# Patient Record
Sex: Female | Born: 1950 | Race: White | Hispanic: No | Marital: Married | Smoking: Former smoker
Health system: Southern US, Community
[De-identification: ages and names within clinical notes are randomized; demographics above are authoritative.]

## PROBLEM LIST (undated history)

## (undated) DIAGNOSIS — I480 Paroxysmal atrial fibrillation: Secondary | ICD-10-CM

## (undated) DIAGNOSIS — G4733 Obstructive sleep apnea (adult) (pediatric): Secondary | ICD-10-CM

## (undated) DIAGNOSIS — I890 Lymphedema, not elsewhere classified: Secondary | ICD-10-CM

## (undated) DIAGNOSIS — R921 Mammographic calcification found on diagnostic imaging of breast: Secondary | ICD-10-CM

## (undated) DIAGNOSIS — I82401 Acute embolism and thrombosis of unspecified deep veins of right lower extremity: Secondary | ICD-10-CM

## (undated) DIAGNOSIS — J189 Pneumonia, unspecified organism: Secondary | ICD-10-CM

## (undated) DIAGNOSIS — M1990 Unspecified osteoarthritis, unspecified site: Secondary | ICD-10-CM

## (undated) DIAGNOSIS — I10 Essential (primary) hypertension: Secondary | ICD-10-CM

## (undated) DIAGNOSIS — C50919 Malignant neoplasm of unspecified site of unspecified female breast: Secondary | ICD-10-CM

## (undated) DIAGNOSIS — F329 Major depressive disorder, single episode, unspecified: Secondary | ICD-10-CM

## (undated) DIAGNOSIS — I499 Cardiac arrhythmia, unspecified: Secondary | ICD-10-CM

## (undated) DIAGNOSIS — F419 Anxiety disorder, unspecified: Secondary | ICD-10-CM

## (undated) DIAGNOSIS — I872 Venous insufficiency (chronic) (peripheral): Secondary | ICD-10-CM

## (undated) DIAGNOSIS — R0602 Shortness of breath: Secondary | ICD-10-CM

## (undated) DIAGNOSIS — F32A Depression, unspecified: Secondary | ICD-10-CM

## (undated) DIAGNOSIS — J42 Unspecified chronic bronchitis: Secondary | ICD-10-CM

## (undated) DIAGNOSIS — L0390 Cellulitis, unspecified: Secondary | ICD-10-CM

## (undated) DIAGNOSIS — Z9989 Dependence on other enabling machines and devices: Secondary | ICD-10-CM

## (undated) HISTORY — PX: CATARACT EXTRACTION W/ INTRAOCULAR LENS IMPLANT: SHX1309

---

## 1986-06-22 DIAGNOSIS — C50919 Malignant neoplasm of unspecified site of unspecified female breast: Secondary | ICD-10-CM

## 1986-06-22 HISTORY — PX: AXILLARY LYMPH NODE DISSECTION: SHX5229

## 1986-06-22 HISTORY — DX: Malignant neoplasm of unspecified site of unspecified female breast: C50.919

## 1986-06-22 HISTORY — PX: BREAST LUMPECTOMY: SHX2

## 1989-06-22 HISTORY — PX: ABDOMINAL HYSTERECTOMY: SHX81

## 1993-06-22 HISTORY — PX: CARPAL TUNNEL RELEASE: SHX101

## 1998-12-09 ENCOUNTER — Inpatient Hospital Stay (HOSPITAL_COMMUNITY): Admission: AD | Admit: 1998-12-09 | Discharge: 1998-12-15 | Payer: Self-pay | Admitting: Family Medicine

## 1999-10-22 ENCOUNTER — Encounter: Payer: Self-pay | Admitting: Family Medicine

## 1999-10-22 ENCOUNTER — Encounter: Admission: RE | Admit: 1999-10-22 | Discharge: 1999-10-22 | Payer: Self-pay | Admitting: Family Medicine

## 2000-10-29 ENCOUNTER — Encounter: Payer: Self-pay | Admitting: Family Medicine

## 2000-10-29 ENCOUNTER — Encounter: Admission: RE | Admit: 2000-10-29 | Discharge: 2000-10-29 | Payer: Self-pay | Admitting: Family Medicine

## 2003-01-10 ENCOUNTER — Encounter: Payer: Self-pay | Admitting: Family Medicine

## 2003-01-10 ENCOUNTER — Encounter: Admission: RE | Admit: 2003-01-10 | Discharge: 2003-01-10 | Payer: Self-pay | Admitting: Family Medicine

## 2003-01-31 ENCOUNTER — Encounter (INDEPENDENT_AMBULATORY_CARE_PROVIDER_SITE_OTHER): Payer: Self-pay | Admitting: Cardiology

## 2003-01-31 ENCOUNTER — Ambulatory Visit (HOSPITAL_COMMUNITY): Admission: RE | Admit: 2003-01-31 | Discharge: 2003-01-31 | Payer: Self-pay | Admitting: Cardiology

## 2005-08-11 ENCOUNTER — Encounter: Admission: RE | Admit: 2005-08-11 | Discharge: 2005-08-11 | Payer: Self-pay | Admitting: Internal Medicine

## 2005-09-07 ENCOUNTER — Ambulatory Visit (HOSPITAL_COMMUNITY): Admission: RE | Admit: 2005-09-07 | Discharge: 2005-09-07 | Payer: Self-pay | Admitting: Gastroenterology

## 2005-09-07 ENCOUNTER — Encounter (INDEPENDENT_AMBULATORY_CARE_PROVIDER_SITE_OTHER): Payer: Self-pay | Admitting: *Deleted

## 2006-06-01 ENCOUNTER — Ambulatory Visit (HOSPITAL_COMMUNITY): Admission: RE | Admit: 2006-06-01 | Discharge: 2006-06-01 | Payer: Self-pay | Admitting: Orthopedic Surgery

## 2006-08-18 ENCOUNTER — Encounter: Admission: RE | Admit: 2006-08-18 | Discharge: 2006-08-18 | Payer: Self-pay | Admitting: Internal Medicine

## 2007-03-14 ENCOUNTER — Inpatient Hospital Stay (HOSPITAL_COMMUNITY): Admission: AD | Admit: 2007-03-14 | Discharge: 2007-03-16 | Payer: Self-pay | Admitting: Internal Medicine

## 2007-03-15 ENCOUNTER — Encounter (INDEPENDENT_AMBULATORY_CARE_PROVIDER_SITE_OTHER): Payer: Self-pay | Admitting: Internal Medicine

## 2007-04-06 ENCOUNTER — Inpatient Hospital Stay (HOSPITAL_COMMUNITY): Admission: EM | Admit: 2007-04-06 | Discharge: 2007-04-12 | Payer: Self-pay | Admitting: Emergency Medicine

## 2007-04-07 ENCOUNTER — Ambulatory Visit: Payer: Self-pay | Admitting: Internal Medicine

## 2007-04-08 ENCOUNTER — Ambulatory Visit: Payer: Self-pay | Admitting: Vascular Surgery

## 2007-04-08 ENCOUNTER — Encounter (INDEPENDENT_AMBULATORY_CARE_PROVIDER_SITE_OTHER): Payer: Self-pay | Admitting: Internal Medicine

## 2008-03-07 ENCOUNTER — Encounter: Admission: RE | Admit: 2008-03-07 | Discharge: 2008-03-07 | Payer: Self-pay | Admitting: Interventional Radiology

## 2008-04-18 ENCOUNTER — Encounter: Admission: RE | Admit: 2008-04-18 | Discharge: 2008-04-18 | Payer: Self-pay | Admitting: Interventional Radiology

## 2008-04-25 ENCOUNTER — Encounter: Admission: RE | Admit: 2008-04-25 | Discharge: 2008-04-25 | Payer: Self-pay | Admitting: Interventional Radiology

## 2008-06-19 ENCOUNTER — Encounter: Admission: RE | Admit: 2008-06-19 | Discharge: 2008-06-19 | Payer: Self-pay | Admitting: Interventional Radiology

## 2008-07-31 ENCOUNTER — Encounter: Admission: RE | Admit: 2008-07-31 | Discharge: 2008-07-31 | Payer: Self-pay | Admitting: Interventional Radiology

## 2008-08-14 ENCOUNTER — Encounter (HOSPITAL_BASED_OUTPATIENT_CLINIC_OR_DEPARTMENT_OTHER): Admission: RE | Admit: 2008-08-14 | Discharge: 2008-11-12 | Payer: Self-pay | Admitting: General Surgery

## 2008-11-12 ENCOUNTER — Encounter (HOSPITAL_BASED_OUTPATIENT_CLINIC_OR_DEPARTMENT_OTHER): Admission: RE | Admit: 2008-11-12 | Discharge: 2009-02-10 | Payer: Self-pay | Admitting: Internal Medicine

## 2010-09-03 ENCOUNTER — Emergency Department (HOSPITAL_COMMUNITY): Payer: Medicare Other

## 2010-09-03 ENCOUNTER — Emergency Department (HOSPITAL_COMMUNITY)
Admission: EM | Admit: 2010-09-03 | Discharge: 2010-09-03 | Disposition: A | Discharge status: Home / Self Care | Payer: Medicare Other | Attending: Emergency Medicine | Admitting: Emergency Medicine

## 2010-09-03 DIAGNOSIS — S52539A Colles' fracture of unspecified radius, initial encounter for closed fracture: Secondary | ICD-10-CM | POA: Insufficient documentation

## 2010-09-03 DIAGNOSIS — M25539 Pain in unspecified wrist: Secondary | ICD-10-CM | POA: Insufficient documentation

## 2010-09-03 DIAGNOSIS — M25519 Pain in unspecified shoulder: Secondary | ICD-10-CM | POA: Insufficient documentation

## 2010-09-03 DIAGNOSIS — W010XXA Fall on same level from slipping, tripping and stumbling without subsequent striking against object, initial encounter: Secondary | ICD-10-CM | POA: Insufficient documentation

## 2010-09-03 DIAGNOSIS — I10 Essential (primary) hypertension: Secondary | ICD-10-CM | POA: Insufficient documentation

## 2010-09-03 DIAGNOSIS — S42293A Other displaced fracture of upper end of unspecified humerus, initial encounter for closed fracture: Secondary | ICD-10-CM | POA: Insufficient documentation

## 2010-09-03 DIAGNOSIS — Y92009 Unspecified place in unspecified non-institutional (private) residence as the place of occurrence of the external cause: Secondary | ICD-10-CM | POA: Insufficient documentation

## 2010-09-08 ENCOUNTER — Inpatient Hospital Stay (HOSPITAL_COMMUNITY)
Admission: EM | Admit: 2010-09-08 | Discharge: 2010-09-10 | DRG: 560 | Disposition: A | Discharge status: Home Health | Payer: Medicare Other | Attending: Family Medicine | Admitting: Family Medicine

## 2010-09-08 ENCOUNTER — Emergency Department (HOSPITAL_COMMUNITY): Payer: Medicare Other

## 2010-09-08 DIAGNOSIS — I872 Venous insufficiency (chronic) (peripheral): Secondary | ICD-10-CM | POA: Diagnosis present

## 2010-09-08 DIAGNOSIS — L97909 Non-pressure chronic ulcer of unspecified part of unspecified lower leg with unspecified severity: Secondary | ICD-10-CM | POA: Diagnosis present

## 2010-09-08 DIAGNOSIS — I10 Essential (primary) hypertension: Secondary | ICD-10-CM | POA: Diagnosis present

## 2010-09-08 DIAGNOSIS — IMO0001 Reserved for inherently not codable concepts without codable children: Principal | ICD-10-CM

## 2010-09-08 LAB — DIFFERENTIAL
Eosinophils Relative: 3 % (ref 0–5)
Lymphocytes Relative: 23 % (ref 12–46)
Neutro Abs: 6.8 10*3/uL (ref 1.7–7.7)
Neutrophils Relative %: 67 % (ref 43–77)

## 2010-09-08 LAB — CBC
HCT: 37.6 % (ref 36.0–46.0)
Hemoglobin: 12.4 g/dL (ref 12.0–15.0)
MCH: 30.2 pg (ref 26.0–34.0)
MCV: 91.7 fL (ref 78.0–100.0)
Platelets: 273 10*3/uL (ref 150–400)
RDW: 13.9 % (ref 11.5–15.5)

## 2010-09-08 LAB — POCT I-STAT, CHEM 8
BUN: 11 mg/dL (ref 6–23)
Calcium, Ion: 1.17 mmol/L (ref 1.12–1.32)
Chloride: 104 mEq/L (ref 96–112)
Glucose, Bld: 99 mg/dL (ref 70–99)
HCT: 38 % (ref 36.0–46.0)
Sodium: 139 mEq/L (ref 135–145)

## 2010-09-09 LAB — BASIC METABOLIC PANEL
BUN: 10 mg/dL (ref 6–23)
Calcium: 9 mg/dL (ref 8.4–10.5)
Creatinine, Ser: 0.84 mg/dL (ref 0.4–1.2)
GFR calc Af Amer: >60 mL/min (ref >60)
Glucose, Bld: 101 mg/dL — ABNORMAL HIGH (ref 70–99)

## 2010-09-09 LAB — LIPID PANEL
LDL Cholesterol: 71 mg/dL (ref 0–99)
VLDL: 16 mg/dL (ref 0–40)

## 2010-09-09 NOTE — H&P (Signed)
NAMEELMER, [name]              ACCOUNT NO.:  [id number]  MEDICAL RECORD NO.:  [id number]           PATIENT TYPE:  E  LOCATION:  [facility]                         FACILITY:  [facility]  PHYSICIAN:  Homero Fellers, MD   DATE OF BIRTH:  January 11, 1951  DATE OF ADMISSION:  09/08/2010 DATE OF DISCHARGE:                             HISTORY & PHYSICAL   PRIMARY CARE PHYSICIAN:  None.  CHIEF COMPLAINT:  Fall with fracture to the right shoulder and left wrist.  HISTORY OF PRESENT ILLNESS:  This is a 60 year old obese Caucasian woman who lives alone by herself.  She fell about 5 days ago and came to the emergency room.  Evaluation showed impacted right shoulder fracture with comminuted left radial fracture.  She had a cast placed on her left wrist, and the right shoulder was partly immobilized.  She was asked to follow up with Orthopedics as an outpatient.  The patient went home on pain medicines, but because of multiple fractures, she could not take care of herself including feeding, Bactrim use, and other simple day to day activities.  She came to the emergency room after her son who was visiting decided to bring her to the hospital, will be asked to admit her because of social issues.  She will need placement to allow the fracture to heal.  She has complained of mild pain in the right shoulder, left wrist, and the right hip area.  There is no headache, fever, cough, chest pain, shortness of breath, nausea, or vomiting.  PAST MEDICAL HISTORY: 1. High blood pressure. 2. History of chronic leg swelling with recurrent cellulitis. 3. She has healing ulcer over the posterior surface of the right leg,     which has almost healed up.  MEDICATION:  Cannot remember.  ALLERGIES:  EFFEXOR.  SOCIAL HISTORY:  No smoking, alcohol, or drugs.  FAMILY HISTORY:  Noncontributory.  A 10-point review of systems negative except as above.  PHYSICAL EXAMINATION:  VITAL SIGNS:  Blood pressure is 160/73,  pulse 85, respirations 14, temperature is 98.3. GENERAL:  The patient is obese, lying in bed comfortable, appeared to be in no distress. NECK:  Supple.  No JVD, adenopathy, or thyromegaly. LUNGS:  Clear to auscultation bilaterally. HEART:  S1 and S2.  No murmurs, rubs, or gallops. ABDOMEN:  Obese, soft, nontender.  Bowel sounds present.  No masses. EXTREMITIES:  Trace to 1+ edema bilaterally with stage I healing ulcer over the posterior aspect of the right leg. MUSCULOSKELETAL:  There is a cast over the left wrist and forearm.  LABORATORY:  White count is 10.1, hemoglobin is 12.4, platelet count is 273.  Sodium 139, potassium 3.8, BUN 11, creatinine 0.8, glucose 99.  ASSESSMENT: 1. Recent fracture involving the right shoulder, which is impacted and     comminuted left radial fracture. 2. Poor self-care. 3. Uncontrolled high blood pressure. 4. Morbid obesity.  PLAN:  Admit to medical bed.  The patient is on pain medicines.  Social worker will be consulted to assist with placement to allow fracture to heal up.  She will be on DVT prophylaxis.  Her condition is otherwise stable.  I will start her on small dose of blood pressure medicine to control her blood pressure.     Homero Fellers, MD     FA/MEDQ  D:  09/08/2010  T:  09/08/2010  Job:  [id number]  Electronically Signed by Homero Fellers  on 09/09/2010 02:16:17 AM

## 2010-09-11 NOTE — Discharge Summary (Signed)
NAMEMIRIANA, GAERTNER              ACCOUNT NO.:  [id number]  MEDICAL RECORD NO.:  [id number]           PATIENT TYPE:  I  LOCATION:  5008                         FACILITY:  [facility]  PHYSICIAN:  Brendia Sacks, MD    DATE OF BIRTH:  Feb 16, 1951  DATE OF ADMISSION:  09/08/2010 DATE OF DISCHARGE:  09/10/2010                              DISCHARGE SUMMARY   CONDITION ON DISCHARGE:  Stable.  PRIMARY CARE PHYSICIAN:  None.  PRIMARY ORTHOPEDIC SURGEON:  Lubertha Basque. Jerl Santos, MD  DISCHARGE DIAGNOSES: 1. History of recent fall. 2. Left wrist and right shoulder fractures. 3. Hypertension, stable.  HISTORY OF PRESENT ILLNESS:  This is a 60 year old woman who lives by herself.  She fell approximately 5 days prior to admission and sustained a fracture of the right shoulder and the left wrist fracture.  Cast was placed on her left wrist and her right shoulder was apparently partially immobilized.  She was then discharged home to followup with Orthopedics. She went home on pain medications, but she was having difficulty taking care of herself, so she came back to the emergency room.  She was admitted because it was thought that she may need placement.  HOSPITAL COURSE: 1. History of recent fall resulting in left wrist and right shoulder     fractures.  The patient was seen in consultation with Orthopedics     during this hospitalization.  Dr. Jerl Santos recommended conservative     management that is to say no surgery.  He recommended continuing     the right shoulder in the sling and placing a splint on the left     wrist.  The patient was seen by physical and occupational therapy     and skilled rehab was recommended.  However, because her health is     otherwise stable, she does not meet criteria for admission or     observation unfortunately.  Therefore, she does not qualify for     rehab stay.  Of note, the patient is able to ambulate without     assistance and go to the bathroom without  assistance.  She however     does have a chronic right leg wound, which because of her right     shoulder fracture and left wrist fracture, she is not going to be     able to manage herself.  Therefore, it has been recommended that     she have home health RN assist with this, and of course, it has     been recommended by physical and occupational therapy that she have     outpatient physical and occupational therapy as well the bariatric     3 in 1.  It was also recommended a social work as an outpatient     because she lives alone to facilitate her other needs. 2. Hypertension, this appeared to be stable.  She will continue on     hydrochlorothiazide and follow up in the outpatient setting.  CONSULTATIONS:  Orthopedics, their recommendations as above.  PROCEDURES:  None.  IMAGING: 1. Left wrist film on September 08, 2010, distal radius fracture.  2. Films 5 days prior to admission as follows, the right shoulder film     on September 03, 2010 showed fracture of the distal clavicle and     dedicated clavicular film on September 03, 2010 showed impacted     fracture of the humeral head involving the articular surface.  LABORATORY STUDIES: 1. TSH within normal limits. 2. Total cholesterol 132, triglycerides 29, HDL 45, LDL 71. 3. CBC unremarkable. 4. Basic metabolic panel unremarkable.  DISCHARGE INSTRUCTIONS:  The patient will be discharged home today. Increase activity slowly.  We will have staff contact Dr. Nolon Nations office for recommendations for activity.  Follow up with Dr. Jerl Santos in 1-2 weeks, and the patient should also follow up and establish with a primary care physician in the next 2-4 weeks.  DISCHARGE MEDICATIONS: 1. Acetaminophen 325 mg 2 tablets by mouth every 4 hours as needed for     pain. 2. Oxycodone 5 mg immediate release tablet 5 mg every 4 hours as     needed for pain #45, no refills. 3. Lisinopril 20 mg p.o. daily.  TIME COORDINATING DISCHARGE:  Twenty  minutes.     Brendia Sacks, MD     DG/MEDQ  D:  09/10/2010  T:  09/11/2010  Job:  [id number]  cc:   Lubertha Basque. Jerl Santos, M.D.  Electronically Signed by Brendia Sacks  on 09/11/2010 04:33:44 PM

## 2010-11-04 NOTE — Assessment & Plan Note (Signed)
[facility]   NAME:  [name], [name]              ACCOUNT NO.:  [id number]   MEDICAL RECORD NO.:  [id number]      DATE OF BIRTH:  Mar 03, 1951   PHYSICIAN:  Jonelle Sports. Sevier, M.D.  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  She had  developed posterior ulcerations on both legs in the area of the distal  muscle bundles and has been under treatment here with usual measures  plus the use of compression pumps at home, which recently we increased  from 1 hour b.i.d. to 1 hour t.i.d.  She had reopened prior to that  increase in area on the posterior aspect of her right lower extremity  and is for that she is seen here today.   PHYSICAL EXAMINATION:  VITAL SIGNS:  Blood pressure 143/78, pulse 111,  respirations 14, and temperature 98.1.  There is not any clearly open  area now on the posterior aspect of the right calf, but there is still  some weeping.   IMPRESSION:  Near resolution of wound, posterior right calf.   DISPOSITION:  The areas was treated with an application of Bactroban and  then covered with a dry dressing.  She is to change the dressing daily  and cleanse the area appropriately at the time of dressing changes.  She  is to continue pumping for 1 hour 3 times daily with her compression  pump.   FOLLOWUP VISIT:  Will be here in 3 weeks or sooner p.r.n.           ______________________________  Jonelle Sports. Cheryll Cockayne, M.D.     RES/MEDQ  D:  11/14/2008  T:  11/15/2008  Job:  [id number]

## 2010-11-04 NOTE — Assessment & Plan Note (Signed)
[facility]   NAME:  BRIAR, SWORD              ACCOUNT NO.:  [id number]   MEDICAL RECORD NO.:  [id number]      DATE OF BIRTH:  [birth year]   PHYSICIAN:  Jonelle Sports. Sevier, M.D.  VISIT DATE:  09/05/2008                                   OFFICE VISIT   HISTORY:  This 60 year old white female who is being followed for venous  stasis ulcerations of the posterior aspects of both calves, which  occurred in the face of chronic venous and lymphedema, and also chronic  morbid obesity.   Thus far, the wounds have been responding nicely to conventional therapy  and plans are underway to obtain venous pumps for the patient about  which she seems to be rather enthusiastic.   Since last seen here a week ago, she has had no particular troubles.   PHYSICAL EXAMINATION:  VITAL SIGNS:  Blood pressure 170/77, pulse 98,  respirations 18, and temperature 98.5.  EXTREMITIES:  On her right posterior lower extremity is one ulcer  measuring 2.5 x 2.0 x 0.1 cm and another measuring 1.3 x 1.3 x 0.1 cm  whereas on the posterior left lower extremity, there is a single ulcer  measuring 0.9 x 1.0 x 0.1 cm.  All of these have some minimal slough in  their bases.   IMPRESSION:  Chronic stasis ulcerations bilateral lower extremities with  acceptable rate of improvement.   DISPOSITION:  Once again today, contact was made with the [facility]  regarding the patient's venous pumps and they promise a definite answer  with respect to coverage, etc., within the next 2 days.   Meanwhile, the patient's wounds are selectively debrided of the small  amounts of slough that are existing in each, this is not without some  discomfort to the patient despite the use of 5% lidocaine ointment, but  we are able to complete the treatment.  The extremities were then placed  in Unna wraps bilaterally and just to pace component and then the 2  outer layers a pro for wrap were placed on both lower extremities.   Follow up visit will be here in 1 week for the nurse to change her  dressings and in 2 weeks to the physician.           ______________________________  Jonelle Sports. Cheryll Cockayne, M.D.     RES/MEDQ  D:  09/05/2008  T:  09/06/2008  Job:  [id number]

## 2010-11-04 NOTE — H&P (Signed)
[name], [name]              ACCOUNT NO.:  [id number]   MEDICAL RECORD NO.:  [id number]          PATIENT TYPE:  INP   LOCATION:  5028                         FACILITY:  [facility]   PHYSICIAN:  Marcellus Scott, MD     DATE OF BIRTH:  09-20-1950   DATE OF ADMISSION:  03/14/2007  DATE OF DISCHARGE:                              HISTORY & PHYSICAL   PRIMARY MEDICAL DOCTOR:  Olene Craven, M.D.   CHIEF COMPLAINT:  Recurrent pain, redness and swelling of the right  upper extremity and right breast for two weeks.   HISTORY OF PRESENTING ILLNESS:  [name] is a pleasant 60 year old  Caucasian female patient with history of hypertension, right breast  cancer status post lumpectomy in 1988, who was directly admitted to the  medical floor by the primary medical doctor.  The patient was in her  usual state of health until approximately two weeks go, when she noted  pain, redness, swelling, itching of her right upper extremity.  She had  a small cut in the web space of her 3rd and 4th finger.  That cut might  have precipitated all of this.  In any case, the patient also had high  grade fever of 102.7 degrees Fahrenheit with associated chills.  The  patient was treated with a course of Doxycycline with complete  resolution of symptoms in that extremity, only to start having the same  symptoms in the right breast when the patient was switched to oral  Levaquin.  That also resolved five days ago.  Then, again, since one day  the patient has noted subacute onset of pain, redness, swelling, burning  of the right upper extremity.  She also has some fever and chills.  The  patient says that since her surgery for the breast cancer her right  upper extremity is asymmetrically slightly larger than the left.  The  pain, she indicates, is a burning kind of pain.  The patient has also a  minimal amount of redness and pain on the right breast.  She has chronic  redness underneath both breasts for a  long time.  For this presentation,  the patient has been sent to the hospital floor for IV antibiotic  treatment.   PAST MEDICAL HISTORY:  1. Hypertension.  2. Bronchitis.  3. History of bilateral lower extremity cellulitis years ago which      required I&D and has resolved for the last five years.  4. Obesity.  5. Chronic diarrhea.   PAST SURGICAL HISTORY:  1. Right breast cancer status post lumpectomy with axillary node      sampling and status post irradiation in 1988.  2. Hysterectomy in 1992.  3. Carpal tunnel syndrome surgery on the right in 2008.   ALLERGIES:  EFFEXOR   MEDICATIONS:  1. Levaquin 500 mg p.o. daily.  2. Vicodin 5/500 one p.o. q6 p.r.n  3. Vibra tabs 100 mg p.o. b.i.d.  4. Zoloft 50 mg p.o. daily.  5. Lisinopril--Hydrochlorothiazide 10/12.5, one p.o. daily.   FAMILY HISTORY:  1. Mother has history of phlebitis.  2. Older brother with history  of hypertension.   SOCIAL HISTORY:  The patient is married.  She has two older children.  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  She quit smoking on Oct 27, 2006.  Prior to  that, she has a 40-pack-year smoking history.  She has used marijuana  decades ago.  There is no history of alcohol or drug abuse.   REVIEW OF SYSTEMS:  Fourteen systems reviewed and apart from the history  of present illness, is pertinent for:  1. Mammogram done this year, said to be normal.  2. Diarrhea of about 5-7x daily, which is a chronic symptom and has      been evaluated by GI.  3. Chronic dyspnea on exertion secondary to obesity.   PHYSICAL EXAMINATION:  [name] is a moderately built and morbidly  obese female.  The patient is in no obvious distress.  VITAL SIGNS:  Temperature is 98.3 degrees Fahrenheit, pulse 70/min,  respirations 18/min, and blood pressure 111/78, saturating at 98% on  room air.  HEENT:  Nontraumatic, normocephalic.  Pupils equally reacting to  light  and accommodation.  Oral cavity with no oropharyngeal erythema or  thrush.  NECK:  Without JVD, carotid bruit, adenopathy, or goiter. Supple.  LYMPHATICS:  No lymphadenopathy.  RESPIRATORY:  Clear to auscultation bilaterally.  CARDIOVASCULAR:  First and second heart sounds are heard.  No third or  fourth heart sounds.  No murmurs, rubs, or gallops.  ABDOMEN:  Non distended, nontender.  No organomegaly or mass.  Bowel  sounds are normal.  CNS:  The patient is awake, alert, oriented x3, with no focal  neurological deficits.  EXTREMITIES:  Bilateral lower extremities with hyperpigmented below the  midshin.  Right upper extremity swollen diffusely with diffuse erythema,  more so in the forearm and in the arm, more so medially.  There is  associated warmth and tenderness.  No obvious fluctuation suggestive of  an underlying abscess.  BREASTS:  Examination done with a chaperone nurse in the room.  Right  breast with surgical scar on the right upper quadrant.  Right breast is  asymmetrically smaller since surgery.  The skin is indurated with ?peau  d' orange with patchy redness but nontender or warm.  There is also  extensive erythema with some excoriation underneath the breast.   LAB:  Labs are yet to be drawn.   ASSESSMENT/PLAN:  1. Recurrent cellulitis, right upper extremity and breast.  Will admit      patient to the hospital.  Will obtain blood cultures.  Will elevate      the right upper extremity.  Will obtain right upper extremity      venous doppler to rule out DVT and breast ultrasound to rule out      any collections or mass.  Will place the patient on empiric IV      Vancomycin and monitor for response.  Will also provide pain      medications.  2. Fungal infection/Tinea underneath the breasts, for Nystatin powder.  3. Hypertension, which is controlled.  To continue home Lisinopril and      Hydrochlorothiazide.  4. History of obesity.  The patient has had nutrition  consult at the      PMD's office regarding diet and exercise.      Marcellus Scott, MD  Electronically Signed     AH/MEDQ  D:  03/14/2007  T:  03/14/2007  Job:  [id number]   cc:   Olene Craven, M.D.

## 2010-11-04 NOTE — Assessment & Plan Note (Signed)
[facility]   NAME:  [name], [name]              ACCOUNT NO.:  [id number]   MEDICAL RECORD NO.:  [id number]      DATE OF BIRTH:  02-Feb-1951   PHYSICIAN:  Joanne Gavel, M.D.         VISIT DATE:  11/07/2008                                   OFFICE VISIT   HISTORY OF PRESENT ILLNESS:  A 60 year old female with severe stasis  dermatitis and ulcerations previously to both lower extremities.  They  were healed.  She has been on venous lymphedema pump therapy twice a day  with marked relief of symptoms.   PHYSICAL EXAMINATION:  Today reveals the patient is afebrile and stable,  but she has developed two very superficial wounds in the midst of the  stasis dermatitis on the right lower extremity.   TREATMENT AND PLAN:  The wounds were dressed with Bactroban and dry  dressing and she will try 3 times a day pumping to see if she can  decrease the edema.  We will see her in 7 days.  We talked about  Profore, Kerlix and [employer] and she essentially refuses all of those  treatments saying that they are ineffective.      Joanne Gavel, M.D.  Electronically Signed     RA/MEDQ  D:  11/07/2008  T:  11/08/2008  Job:  [id number]

## 2010-11-04 NOTE — Assessment & Plan Note (Signed)
[facility]   NAME:  RAINN, ZUPKO              ACCOUNT NO.:  [id number]   MEDICAL RECORD NO.:  [id number]      DATE OF BIRTH:  12-16-1950   PHYSICIAN:  Joanne Gavel, M.D.              VISIT DATE:                                   OFFICE VISIT   OFFICE VISIT:  This is a 60 year old white female with stasis dermatitis  and ulcerations previously of both lower extremities, now the ulceration  only of the right lower extremity.  She has been on venous pump therapy.  There has been a great deal of relief of symptoms, pain, and the wounds  are smaller.  The wounds have been treated by application of Bactroban,  and most recently Kerlix and Coban.  These are changed by her son every  2 days.  The right posterior lower extremity has several small posterior  ulcerations with some granulation tissue.  There is no maceration and  they are not tender.  The left lower extremity is essentially healed  with epithelialization.  No debridement appears to be necessary.   DISPOSITION:  The right lower extremity will be wrapped with Kerlix,  Coban after Bactroban is applied to the ulcerations.  The patient is  reminded about popping and about elevation.  She no longer wishes to see  the home helpers and will be seen here in 2 weeks.  She has been warned  that if anything happens to the right leg which is no longer being  wrapped, she is to call us immediately.           ______________________________  Joanne Gavel, M.D.     RA/MEDQ  D:  10/03/2008  T:  10/04/2008  Job:  [id number]

## 2010-11-04 NOTE — Discharge Summary (Signed)
[name], [name]              ACCOUNT NO.:  [id number]   MEDICAL RECORD NO.:  [id number]          PATIENT TYPE:  INP   LOCATION:  5704                         FACILITY:  [facility]   PHYSICIAN:  Hind I Elsaid, MD      DATE OF BIRTH:  10-17-1950   DATE OF ADMISSION:  04/06/2007  DATE OF DISCHARGE:  04/12/2007                               DISCHARGE SUMMARY   DISCHARGE DIAGNOSES:  1. Recurrent right arm cellulitis.  2. Hypertension.  3. Right breast lumpectomy with axillary node sampling and is status      post radiation with some change on the breast.  Recommend to follow      with her primary care for mammogram and ultrasound.  4. Hysterectomy in 1992.  5. Carpal tunnel syndrome on the right in 2008.   DISCHARGE MEDICATIONS:  1. Septra-DS 2 tabs p.o. b.i.d. for one week.  2. Vicodin 5/500 mg p.o. every six hours.  3. Zoloft 50 mg p.o. daily.  4. Lisinopril/hydrochlorothiazide 10/12.5 one tab p.o. daily.   CONSULTATIONS:  1. ID consulted for recurrent cellulitis.  2. Hand surgery, Dr. Magnus Ivan consulted for worsening hand cellulitis.   PROCEDURE:  1. Chest x-ray:  Vascular congestion without frank edema.  2. Right upper extremity DVT venous Doppler which was negative for      DVT.   HISTORY OF PRESENT ILLNESS:  Please review the history done by Dr.  Eda Paschal.  In summary, a 60 year old Caucasian female with history of  morbid obesity, hypertension, being followed periodically with mammogram  without active recurrence of the tumor.  Presented with third episode of  cellulitis of her right arm status post failure for outpatient  treatment.  The patient was admitted for further evaluation.   1. Recurrent right arm cellulitis:  The patient was seen in the ED.      Very extensive swelling and erythema of the right arm.  The patient      was started on vancomycin IV.  Blood culture and urine culture were      done which were negative during hospitalization.  On day 2 of  vancomycin, right arm cellulitis and erythema started to get worse      and with bullae presence on the dorsum of her right hand.  Hand      surgery was consulted for impending compartment syndrome.  Hand      surgery recommended to keep the right arm elevated and no need for      surgical intervention at this time.  ID was consulted for      evaluation about the most appropriate antibiotic to avoid any      recurrence.  The patient continued to be on vancomycin.  During the      hospitalization, the right arm swelling and erythema gradually      resolved.  The patient will be discharged on oral Septra double-      strength 2 tabs b.i.d. for one week and recommend that the patient      follow up with Dr. Barbee Shropshire to consider fitting with compressive of  the right arm to counteract the chronic lymphedema.  During the      hospitalization, the patient did not spike a fever and there is no      evidence of white blood cells.  2. Hypertension:  Blood pressure remained controlled.   DISPOSITION:  The patient was medically stable to be discharged home and  to follow up with Dr. Barbee Shropshire within ten days to evaluate if the  patient would benefit from fitting with compressor of the right arm  sleeve  counteract the edema.      Hind Bosie Helper, MD  Electronically Signed     HIE/MEDQ  D:  04/12/2007  T:  04/13/2007  Job:  [id number]

## 2010-11-04 NOTE — Assessment & Plan Note (Signed)
[facility]   NAME:  [name], FEIN              ACCOUNT NO.:  [id number]   MEDICAL RECORD NO.:  [id number]      DATE OF BIRTH:  [birth year]   PHYSICIAN:  Jonelle Sports. Sevier, M.D.  VISIT DATE:  09/19/2008                                   OFFICE VISIT   HISTORY:  This 60 year old white female is seen just for her second  visit for severe stasis dermatitis with ulcerations in both lower  extremities with the ulcerations interestingly being on the posterior  calves.   We have started her on venous pump therapy with which she is  tremendously satisfied, and the wounds themselves have been treated with  an application of Bactroban and Unna paced layer wrap covered then by  the remainder of a Profore wrap (the patient says that she cannot  tolerate the inner ingredient of Profore wrap against her legs.)   Today, she reports considerable pain in the posterior aspect of the  right calf, but otherwise is doing well.   PHYSICAL EXAMINATION:  Blood pressure 116 /85, pulse 84, respirations  18, temperature 98.4.  The posterior left lower extremity is essentially  healed of any open lesions but persists with considerable localized  stasis dermatitis-type effect.   The right posterior lower extremity has several small ulcerations open  now and indeed there is some maceration there and things were tender  (she does admit to have not gotten her dressing saturated on repeated  occasions at the time of showers).   The measurements of these things are documented in the patient's chart  and will not be here repeated.   IMPRESSION:  Satisfactory course of left lower extremity, some  maceration and delayed healing with increased pain in the right lower  extremity.   DISPOSITION:  The area in question is debrided of some of the loose  macerated skin and this is well tolerated.  Following such debridement,  it is then cultured.   Both extremities were then treated with an  application of Bactroban to  the inflamed areas and are placed in Kerlix and Coban wraps and that it  seems the patient will not tolerate either dermatologically or  psychologically anything more.  She will continue with her venous pumps  at home on a 1-hour twice daily basis.   Followup visit will be here in 2 weeks with a Home Health nurse in the  interim to cleanse and apply Bactroban and rewrap her extremities on a  q.2-day basis.           ______________________________  Jonelle Sports Cheryll Cockayne, M.D.     RES/MEDQ  D:  09/19/2008  T:  09/20/2008  Job:  [id number]

## 2010-11-04 NOTE — H&P (Signed)
[name], [name]              ACCOUNT NO.:  [id number]   MEDICAL RECORD NO.:  [id number]          PATIENT TYPE:  INP   LOCATION:  5704                         FACILITY:  [facility]   PHYSICIAN:  Hind I Elsaid, MD      DATE OF BIRTH:  27-Jul-1950   DATE OF ADMISSION:  04/06/2007  DATE OF DISCHARGE:                              HISTORY & PHYSICAL   PRIMARY CARE PHYSICIAN:  Dr. Demetrios Isaacs. Hertweck.   CHIEF COMPLAINT:  Recurrent pain, redness and swelling of the right  upper extremity.   HISTORY OF PRESENT ILLNESS:  [name] is a 60 year old Caucasian  female with a history of morbid obesity, history of hypertension,  history of right breast lumpectomy in 1988 secondary to right breast  cancer.  Being followed periodically with mammogram, with active  recurrence of that tumor.  The patient was recently being treated for  cellulitis of her right arm.  When she was admitted to the hospital and  being treated with intravenous vancomycin and then switched sporadically  to ciprofloxacin and Levaquin.  The patient admitted since that time the  swelling is not completely resolved, and for the last 2 days.  She  started to have erythema and swelling of right upper extremity which is  progressively getting worse.  In any case, the patient also has high-  grade fever of 102 degrees Fahrenheit which was associated with chills.  The patient has been treated before also with doxycycline with complete  resolution of the symptoms in her extremity.  Only to start having the  same symptoms in the right breast when the patient was switched to oral  Levaquin.  That also resolved.  The patient denies any chest pain.  Denies any shortness of breath.   PAST MEDICAL HISTORY:  1. Hypertension.  2. History of bilateral lower extremity cellulitis a year ago which      required incision and drainage and has resolved for the last 5      years.  3. Morbid obesity.   PAST SURGICAL HISTORY:  1. Right breast  lumpectomy with dissection   lymph-node sampling and status post radiation in 1988.  1. Hysterectomy in 1992.  2. Carpal tunnel syndrome surgery on the right in 2008.   ALLERGIES:  EFFEXOR.   MEDICATIONS:  1. Lisinopril/hydrochlorothiazide 10/12.5 one tab p.o. daily.  2. Zoloft 50 mg p.o. daily.  3. Vicodin 5/500.   FAMILY HISTORY:  Mother has a history of phlebitis.  Brother has a  history of hypertension.   SOCIAL HISTORY:  She is married and has 2 other children.  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  She quit smoking.   PHYSICAL EXAMINATION:  GENERAL:  [name] is a morbidly obese lady  not in respiratory distress.  VITAL SIGNS:  Temperature is 102.2, pulse rate 98, respiratory rate 20,  blood pressure 115/54.  HEENT:  Nontraumatic, normocephalic.  Pupils are equal and reactive to  light and accommodation.  Oral cavity with no oropharyngeal erythema.  NECK:  No JVD.  No  carotid bruit.  LYMPHADENOPATHY:  No lymphadenopathy.  HEART:  S1, S2.  No added sounds.  ABDOMEN:  Obese, nontender, no organomegaly.  Bowel sounds are positive.  EXTREMITIES:  There is generalized right upper extremity erythema and  swelling, with no evidence of abscess.  There is no evidence of lymph  node in the right arm pit.  There is also warmth and tenderness.  No  obvious fluctuation suggesting of underlying abscess.  Peripheral pulses  were intact.  BREAST:  Examination also was done.  There is evidence of surgical scar  on the right lateral upper quadrant. The right breast is asymmetrical.  There is also peau d'orange around the breast.  There is also some  excoriation under the breast.   LABS:  Pending.   ASSESSMENT/PLAN:  1. 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  Admit the      patient to the hospital.  We will obtain blood cultures, keep the      right upper arm elevated.  We will get venous Doppler of the right      arm.  We  will start the patient empirically on intravenous      vancomycin and monitor for response.  We will also consult      infectious disease for further help with the best option on      treatment as outpatient.  We will also provide pain medication.  2. Right breast peau d'orange.  The patient has already had an      ultrasound done on September 23 which showed there is no evidence      of breast abscess.  At this time there is no evidence of erythema      or swelling on the breast, but I would like the patient to have a      mammogram for followup for her breast cancer.  At this time, there      is no evidence of surrounding lymphadenopathy.  We will contact her      primary care for followup her mammogram.  3. Hypertension.  The patient to continue her home medications.  4. History of obesity.  The patient had nutrition consult at her      primary medical doctor's office regarding that.  5. Deep venous thrombosis and gastrointestinal prophylaxis.      Hind Bosie Helper, MD  Electronically Signed     HIE/MEDQ  D:  04/08/2007  T:  04/09/2007  Job:  [id number]

## 2010-11-04 NOTE — Consult Note (Signed)
[name], [name]              ACCOUNT NO.:  [id number]   MEDICAL RECORD NO.:  [id number]          PATIENT TYPE:  REC   LOCATION:  FOOT                         FACILITY:  [facility]   PHYSICIAN:  Jonelle Sports. Sevier, M.D. DATE OF BIRTH:  1951/02/18   DATE OF CONSULTATION:  08/15/2008  DATE OF DISCHARGE:                                 CONSULTATION   HISTORY:  This 60 year old white female is seen at the courtesy of Dr.  Fredia Sorrow and Dr. Renae Gloss for assistance with management of venous  ulcerations of the posterior calves bilaterally.   The patient has been morbidly obese for a number of years and in 2002  had for the first time some venous stasis ulcerations on the lower  extremities.  These apparently resolved with standard compressive  approach (at which time, incidentally, she found herself to be allergic  to the components of a Profore wrap), and remarkably, they had not  recurred until early December 2009 when she developed a large ulceration  on the posterior left calf and approximately a month later, two smaller  ulcerations on the posterior right calf.  She was sent to the  interventional radiologist and has had endovascular laser occlusion of  her greater saphenous vein in the left lower extremity.  She has also  had some ablation of some of the smaller varicosities as well.  Despite  this, her ulcers have persisted and she has not in this particular  illness been involved in any compression therapy as yet.   She has a history of staphylococcal infections in the past but has not  been thought to be infected in association with these ulcers.   PAST MEDICAL HISTORY:  Operations include a breast lumpectomy in 1988  apparently for malignancy, and this was followed with radiation,  hysterectomy in 1994, and the venous surgeries as indicated above.  She  has been hospitalized on other occasions in association with bronchitis,  a severe cellulitis of one of her arms and breast  secondary to the  lymphatic ablation from that radiation therapy, and apparently, that was  where she had MRSA and perhaps an occasional hospitalization through the  years for reasons that she is unable to recall.  She has an allergy to  the components of Profore wrap as indicated.  She has an intolerance to  Effexor, which makes her have suicidal ideation.   Her regular medications are lisinopril/hydrochlorothiazide 10/12.5 one  tablet daily and sertraline 100 mg 1 tablet daily.   PERSONAL HISTORY:  The patient is married, is able to do her own  personal care, cooking and other limited housework.  She quit smoking in  2006.  She denies use of alcohol or any recreational drugs.  She is  retired from the [employer].   FAMILY HISTORY:  Available only in check list style, and she says that  there was a family history of hypertension, poor circulation, blood  clots, but no diabetes.   REVIEW OF SYSTEMS:  HEAD, EYES, EARS, NOSE, AND THROAT:  The patient has  had no significant problems there.  CARDIOVASCULAR:  She does have mild  hypertension and does have significant chronic venous disease with  stasis and also with lymphedema.  RESPIRATORY:  She has a history of  bronchitis in the past, but this has not been a recurrent issue since  she gave up smoking in 2006.  GASTROINTESTINAL:  No history of GI  bleeding, significant reflux, etc.  GENITOURINARY:  Occasional urinary  tract infection, does have bladder incontinence.  NEUROPSYCHIATRIC:  Prone to depression, otherwise, no problems and no seizures, strokes, or  sensory losses.  ENDOCRINE:  She has no awareness of any thyroid  disorder and absolutely denies diabetes.  MISCELLANEOUS:  She does  apparently have cataracts and some minor problems with her ears.   PHYSICAL EXAMINATION:  VITAL SIGNS:  Blood pressure 175/85, pulse 94,  respirations 20, and temperature 97.3, her weight is not recorded, but  she is morbidly  obese.  GENERAL:  She is in no immediate distress.  When seen by this examiner,  she is lying on her abdomen, flat without any respiratory distress.  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  She has gross  edema, both venous and lymphedema in nature.  She has some dryness of  the skin of the feet.  Her pulses are everywhere palpable and bounding  and therefore quite adequate.  NEUROLOGIC:  She is alert, cooperative, and neurologically intact.   IMPRESSION:  Morbid obesity with chronic venous and lymphatic edema, now  with stasis ulcerations posterior aspect of both lower extremities.   DISPOSITION:  The wounds are subjected to limited debridement  bilaterally, and these sure then persisting on the right posterior leg,  two areas, one measuring 1.7 x 1.7 x 0.2 cm and other measuring 1.4 x  1.5 x 0.2 cm within a larger area of chronic scarring which has  otherwise, however, epithelialized.   On left posterior lower extremity, there is a single area of ulceration  measuring 1.5 x 1.5 x 0.1 cm, and again, this is within a larger area of  chronic stasis change.   Following debridement as above, the wounds were treated with  applications of Silverlon, and she is placed in a Unna paste inner wrap  and covered them with the exterior two layers of a Profore, which will,  therefore, not come in contact with her skin.  The remaining skin of her  leg is protected by using Bag Balm as a barrier cream.   We discussed with the patient the desirability of ultimately getting her  on to venous and lymphatic pump therapy, and she is in agreement with  this, and we have begun the process to see if we can get her qualified  from the perspective of her insurance company for such treatments.  Meanwhile, she will continue in the wraps that we have provided today.   Followup visit will be here in 1 week.            ______________________________  Jonelle Sports. Cheryll Cockayne, M.D.     RES/MEDQ  D:  08/15/2008  T:  08/16/2008  Job:  [id number]   cc:   Sherrine Maples T. Fredia Sorrow, M.D.  Radiologist  Merlene [name]. Renae Gloss, M.D.

## 2010-11-04 NOTE — Assessment & Plan Note (Signed)
[facility]   NAME:  [name], [name]              ACCOUNT NO.:  [id number]   MEDICAL RECORD NO.:  [id number]      DATE OF BIRTH:  [birth year]   PHYSICIAN:  Jonelle Sports. Sevier, M.D.  VISIT DATE:  10/17/2008                                   OFFICE VISIT   HISTORY:  This 60 year old white female morbidly obese with severe  chronic venous and lymphedema has been followed for ulcerations on the  posterior aspects of both lower extremities.  Over time, those have  healed entirely on the left lower extremity and a single ulcer persists  on the posterior aspect of the left lower extremity.   We had obtained for her venous pumps which she has been using an hour  twice daily and says that she feels better and her legs are doing better  than she can remember in several years.   PHYSICAL EXAMINATION:  VITAL SIGNS:  Blood pressure 144/81, pulse 85,  respirations 18, and temperature 98.5.  EXTREMITIES:  The left lower extremity is indeed healed.  The right  lower extremity posteriorly has a single ulcer open measuring 0.9 x 0.5  x 0.2 cm.  This is partial-thickness epithelialized and there does not  appear to be any infection.  The degree of edema in tension in these  extremities is considerably better since she had been on the pumping  program.   IMPRESSION:  Satisfactory course.   DISPOSITION:  The patient says that the wraps we place on her do not  stay up and tend to slide down, so she has been in the habit of doing  her own custom wraps at home but recently has used no wraps at all  because of her continued improvement.   What we have agreed to today is that when she pumps her extremities she  will cover the wounded area on the right lower extremity with Kerlix  wrap during the pumping exercise, but otherwise may allow it go  undressed and unwrapped.   Follow up visit will be here in 3 weeks and quite possibly we will be  able to release her at that time.        ______________________________  Jonelle Sports Cheryll Cockayne, M.D.     RES/MEDQ  D:  10/17/2008  T:  10/18/2008  Job:  [id number]

## 2010-11-04 NOTE — Assessment & Plan Note (Signed)
[facility]   NAME:  [name], [name]              ACCOUNT NO.:  [id number]   MEDICAL RECORD NO.:  [id number]      DATE OF BIRTH:  1951/02/21   PHYSICIAN:  Jonelle Sports. Sevier, M.D.  VISIT DATE:  08/22/2008                                   OFFICE VISIT   HISTORY:  This 60 year old white female is followed for stasis  ulcerations on the distal posterior aspect of both calves occurring in  areas of extreme hemosiderosis change from chronic stasis dermatitis.  She is morbidly obese and chronically edematous.   She had been treated in wraps since her last visit here using an inner  layer of an Unna wrap.  The Unna wrap covered then by the 2 outer layers  of Profore.  This apparently has served well and these stayed up  reasonably well over the past week.   She reports no problems except some stinging in the areas of the wounds,  but no fever, chills, or other problematic symptoms.   PHYSICAL EXAMINATION:  Blood pressures 148/86, pulse 88, respirations  18, and temperature 98.1.  The single wound on the posterior left lower  extremity measures 1.4 x 1.6 x 0.1 cm and the two on the right lower  extremity measure 1.5 x 1.5 x 0.1 and 1.0 x 0.7 x 0.1.  All have some  degree of epithelialization beginning, but they remains a little exudate  or slough in each of the wound bases.   IMPRESSION:  Satisfactory course of stasis ulcerations, bilateral lower  extremities.   DISPOSITION:  All three of the wounds as described are sharply debrided  selectively of the exudate and slough without incident.  This was done  without anesthesia.  The wounds are then dressed with an application of  Silverlon and again she is wrapped with an Unna placed in the layer and  the 2 outer layers with Profore wrap.   We continued our efforts to see if we can possibly obtain venous pumps  for this lady because that would appear to be the best answer to keep  her from getting recurrent disease  once we get these healed.   She will return in 1 week to be seen by the nurse and in 2 weeks by the  physician.           ______________________________  Jonelle Sports. Cheryll Cockayne, M.D.     RES/MEDQ  D:  08/22/2008  T:  08/23/2008  Job:  [id number]

## 2010-11-07 NOTE — Op Note (Signed)
[name], [name]              ACCOUNT NO.:  [id number]   MEDICAL RECORD NO.:  [id number]          PATIENT TYPE:  AMB   LOCATION:  ENDO                         FACILITY:  [facility]   PHYSICIAN:  Anselmo Rod, M.D.  DATE OF BIRTH:  02-Nov-1950   DATE OF PROCEDURE:  09/07/2005  DATE OF DISCHARGE:                                 OPERATIVE REPORT   PROCEDURE:  Esophagogastroduodenoscopy with small-bowel biopsies.   ENDOSCOPIST:  Anselmo Rod, M.D.   INSTRUMENT USED:  Olympus videopanendoscope.   INDICATIONS FOR PROCEDURE:  The patient is a 60 year old white female with a  personal history of breast cancer undergoing an EGD for severe diarrhea, and  small-bowel biopsies are planned.   PREPROCEDURE PREPARATION:  Informed consent was procured from the patient.  The patient was fasted for four hours prior to the procedure.   PREPROCEDURE PHYSICAL EXAMINATION:  VITAL SIGNS:  Stable.  NECK:  Supple.  CHEST:  Clear to auscultation.  S1 and S2 regular.  ABDOMEN:  Soft with normal bowel sounds.   DESCRIPTION OF PROCEDURE:  The patient was placed in the left lateral  decubitus position and sedated with 75 mcg of Fentanyl and 6 mg of Versed in  slow incremental doses.  Once the patient was adequately sedated and  maintained on low-flow oxygen and continuous cardiac monitoring, the Olympus  videopanendoscope was advanced through the mouthpiece, over the tongue, and  into the esophagus.  Under direct vision, the entire esophagus appeared  normal, with no evidence of ring, stricture, mass, esophagitis, or Barrett's  mucosa.  The scope was then advanced into the stomach.  The entire gastric  mucosa appeared normal.  Retroflexion in the high cardia revealed no  abnormalities.  Small-bowel biopsies were done to rule out sprue.  No  ulcers, erosions, masses, or polyps were seen.   IMPRESSION:  1.  Mild gastritis noted in the stomach.  2.  Small-bowel biopsies done to rule out sprue.  3.  No  ulcers, masses, or polyps seen.   RECOMMENDATIONS:  1.  Await pathology results.  2.  Avoid nonsteroidals for now.  3.  Proceed with colonoscopy at this time.  Further recommendations will be      made after.      Anselmo Rod, M.D.  Electronically Signed     JNM/MEDQ  D:  09/07/2005  T:  09/08/2005  Job:  [id number]   cc:   Olene Craven, M.D.  Fax: [phone]

## 2010-11-07 NOTE — Op Note (Signed)
[name], [name]              ACCOUNT NO.:  [id number]   MEDICAL RECORD NO.:  [id number]          PATIENT TYPE:  AMB   LOCATION:  SDS                          FACILITY:  [facility]   PHYSICIAN:  Katy Fitch. Sypher, M.D. DATE OF BIRTH:  1950/09/13   DATE OF PROCEDURE:  06/01/2006  DATE OF DISCHARGE:                               OPERATIVE REPORT   PREOPERATIVE DIAGNOSIS:  Chronic carpal tunnel syndrome, right wrist.   POSTOPERATIVE DIAGNOSIS:  Chronic carpal tunnel syndrome, right wrist.   OPERATION:  Release of right transverse carpal ligament.   OPERATING SURGEON:  Josephine Igo, M.D.   ASSISTANT:  Molly Maduro Dasnoit PA-C.   ANESTHESIA:  An attempted IV regional proximal forearm level was failed  due to inability to obtain arterial occlusion followed by general  anesthesia, supervising anesthesiologist is Dr. Michelle Piper.   INDICATIONS:  [name] is a 60 year old woman referred through the  courtesy of Santina Evans A. Orlin Hilding, M.D. for evaluation and management of  bilateral carpal tunnel syndrome.  [name] is morbidly obese at 5  feet 5 inches tall, weighing 387 pounds.   Her surgery was scheduled at the hospital based on anesthesia  recommendation for special backup services and special operative  equipment including an appropriate sized OR bed.   Preoperatively, she had electrodiagnostic studies by Dr. Orlin Hilding which  revealed severe carpal tunnel syndrome.  Dr. Orlin Hilding referred her for  hand surgery consult.   Preoperatively she was evaluated by Dr. Michelle Piper and deemed an appropriate  candidate for either IV regional or if necessary general anesthesia.   PROCEDURE:  [name] is brought to the operating room and placed  in supine position on the operating table.   Following the induction of general sedation and IV regional block was  placed proximal forearm level.  Despite a systolic blood pressure under  140, we could not achieve adequate occlusion of the radial and  ulnar  arteries utilizing a double bladder tourniquet.  Ultimately general  anesthesia was induced under Dr. Deirdre Priest direct supervision followed by  routine Betadine scrub and paint of the right arm.   The arm was noted be plethoric and congested with blood therefore with  Dr. Deirdre Priest consent, the tourniquet released and we proceeded to  reexanguinate the arm with an Esmarch bandage and inflate the arterial  tourniquet to 315 mmHg.   Procedure commenced with short incision in line with the ring finger in  the palm.  The subcutaneous tissues were carefully divided revealing the  palmar fascia.  This split longitudinally to reveal the common sensory  branch of the median nerve.  These were followed back to transverse  carpal ligament was carefully isolated from the median nerve.  The  ligament was released along its ulnar border extending into the distal  forearm.  This widely opened carpal canal.  The tenosynovium and median  nerve were noted be edematous and injected due to chronic pressure.  Bleeding points were cauterized with bipolar current.  The wound was  repaired intradermal 3-0 Prolene suture.  A compressive dressing was  applied with a volar plaster splint maintaining the  wrist in 5 degrees  dorsiflexion.   For aftercare [name] is placed on Percocet 5 mg one to two tablets  p.o. 4 to 6 hours as needed for pain 20 tablets without refill.   She will return to see [place] in the office for follow-up in 1 week for  dressing change and advancement to a therapy program.      Katy Fitch. Sypher, M.D.  Electronically Signed     RVS/MEDQ  D:  06/01/2006  T:  06/01/2006  Job:  [id number]   cc:   Santina Evans A. Orlin Hilding, M.D.  Olene Craven, M.D.

## 2010-11-07 NOTE — Op Note (Signed)
[name], [name]              ACCOUNT NO.:  [id number]   MEDICAL RECORD NO.:  [id number]          PATIENT TYPE:  AMB   LOCATION:  ENDO                         FACILITY:  [facility]   PHYSICIAN:  Anselmo Rod, M.D.  DATE OF BIRTH:  [birth year]   DATE OF PROCEDURE:  09/07/2005  DATE OF DISCHARGE:                                 OPERATIVE REPORT   PROCEDURE:  Colonoscopy with multiple biopsies.   ENDOSCOPIST:  Anselmo Rod, M.D.   INSTRUMENT USED:  Olympus videocolonoscope.   INDICATIONS FOR PROCEDURE:  The patient is a 60 year old morbidly obese  white female undergoing a colonoscopy to further evaluation her diarrhea,  rule out collagenous colitis.  The patient has a personal history of breast  cancer as well.   PREPROCEDURE PREPARATION:  Informed consent was procured from the patient.  The patient was fasted for four hours prior to the procedure and prepped  with OsmoPrep pills the night and the morning of the procedure.  The risks  and benefits of the procedure, including a 10% missed rate of cancer and  polyps, was discussed with the patient as well.   PREPROCEDURE PHYSICAL EXAMINATION:  VITAL SIGNS:  Stable.  NECK:  Supple.  CHEST:  Clear to auscultation.  S1 and S2 regular.  ABDOMEN:  The patient has a morbidly obese abdomen with normal bowel sounds  and no hepatosplenomegaly appreciated.   DESCRIPTION OF PROCEDURE:  The patient was placed in the left lateral  decubitus position and sedated with an additional 25 mcg of Fentanyl and 4  mg of Versed.  Once the patient was adequately sedated and maintained on low-  flow oxygen and continuous cardiac monitoring, the Olympus videocolonoscope  was advanced from the rectum to the cecum.  There were significant residual  stool in the colon.  Multiple washings were done.  There was some loss of  vascular marking throughout the colonic mucosa.  Biopsies were done to rule  out microscopic versus collagenous colitis.  Small  internal hemorrhoids were  seen on retroflexion.  No masses or polyps were appreciated.  Small lesions  could be missed.   IMPRESSION:  1.  Small nonbleeding internal hemorrhoids.  2.  Random colon biopsies done to rule out microscopic versus collagenous      colitis.  3.  Some residual stool in the colon.  No masses or polyps seen.  4.  No evidence of diverticulosis.   RECOMMENDATIONS:  1.  Await pathology results.  2.  Avoid all artificial sweeteners.  3.  Outpatient followup in the next two weeks or earlier if need be.      Anselmo Rod, M.D.  Electronically Signed     JNM/MEDQ  D:  09/07/2005  T:  09/08/2005  Job:  [id number]   cc:   Olene Craven, M.D.  Fax: [phone]

## 2011-01-08 ENCOUNTER — Other Ambulatory Visit: Payer: Self-pay | Admitting: Internal Medicine

## 2011-01-08 DIAGNOSIS — N63 Unspecified lump in unspecified breast: Secondary | ICD-10-CM

## 2011-01-14 ENCOUNTER — Other Ambulatory Visit: Payer: Medicare Other

## 2011-01-19 ENCOUNTER — Other Ambulatory Visit: Payer: Medicare Other

## 2011-01-20 ENCOUNTER — Ambulatory Visit
Admission: RE | Admit: 2011-01-20 | Discharge: 2011-01-20 | Disposition: A | Discharge status: Home / Self Care | Payer: Medicare Other | Source: Ambulatory Visit | Attending: Internal Medicine | Admitting: Internal Medicine

## 2011-01-20 DIAGNOSIS — N63 Unspecified lump in unspecified breast: Secondary | ICD-10-CM

## 2011-04-01 LAB — BASIC METABOLIC PANEL
BUN: 11
CO2: 23
CO2: 25
Calcium: 8.3 — ABNORMAL LOW
Calcium: 9.1
Chloride: 106
Creatinine, Ser: 0.96
GFR calc Af Amer: >60
GFR calc non Af Amer: >60
Glucose, Bld: 102 — ABNORMAL HIGH
Sodium: 137

## 2011-04-01 LAB — CREATININE, SERUM
Creatinine, Ser: 0.95
GFR calc Af Amer: >60

## 2011-04-01 LAB — CBC
MCHC: 33.9
MCV: 92.7
Platelets: 227
RDW: 14

## 2011-04-01 LAB — COMPREHENSIVE METABOLIC PANEL
AST: 22
CO2: 19
Chloride: 106
Creatinine, Ser: 1
GFR calc Af Amer: >60
GFR calc non Af Amer: 57 — ABNORMAL LOW
Glucose, Bld: 97
Total Bilirubin: 0.8

## 2011-04-01 LAB — CULTURE, BLOOD (ROUTINE X 2)

## 2011-04-01 LAB — DIFFERENTIAL
Basophils Absolute: 0
Basophils Relative: 0
Eosinophils Absolute: 0
Monocytes Relative: 2 — ABNORMAL LOW
Neutrophils Relative %: 93 — ABNORMAL HIGH

## 2011-04-01 LAB — PROTIME-INR: Prothrombin Time: 14.3

## 2011-04-02 LAB — URINALYSIS, ROUTINE W REFLEX MICROSCOPIC
Glucose, UA: NEGATIVE
Nitrite: NEGATIVE
Protein, ur: NEGATIVE

## 2011-04-02 LAB — URINE MICROSCOPIC-ADD ON

## 2011-04-02 LAB — CBC
HCT: 33.8 — ABNORMAL LOW
Hemoglobin: 12.3
MCHC: 34.2
MCV: 92.3
Platelets: 272
RBC: 3.91
RDW: 13.3
WBC: 7.8

## 2011-04-02 LAB — BASIC METABOLIC PANEL
BUN: 10
CO2: 25
Chloride: 104
Creatinine, Ser: 0.98
Glucose, Bld: 96

## 2011-04-02 LAB — CULTURE, BLOOD (ROUTINE X 2)

## 2011-04-02 LAB — COMPREHENSIVE METABOLIC PANEL
ALT: 14
Alkaline Phosphatase: 77
CO2: 27
Chloride: 104
GFR calc non Af Amer: 52 — ABNORMAL LOW
Glucose, Bld: 93
Potassium: 4.6
Sodium: 139

## 2011-04-02 LAB — TSH: TSH: 2.687

## 2011-04-02 LAB — DIFFERENTIAL
Basophils Relative: 0
Eosinophils Absolute: 0.2
Monocytes Relative: 8
Neutrophils Relative %: 67

## 2011-04-02 LAB — LIPID PANEL
Cholesterol: 123
HDL: 42
Triglycerides: 75

## 2011-07-01 ENCOUNTER — Encounter (HOSPITAL_COMMUNITY): Payer: Self-pay | Admitting: Emergency Medicine

## 2011-07-01 ENCOUNTER — Emergency Department (HOSPITAL_COMMUNITY): Payer: Medicare Other

## 2011-07-01 ENCOUNTER — Inpatient Hospital Stay (HOSPITAL_COMMUNITY)
Admission: EM | Admit: 2011-07-01 | Discharge: 2011-07-06 | DRG: 603 | Disposition: A | Discharge status: Home / Self Care | Payer: Medicare Other | Attending: Internal Medicine | Admitting: Internal Medicine

## 2011-07-01 DIAGNOSIS — IMO0002 Reserved for concepts with insufficient information to code with codable children: Principal | ICD-10-CM | POA: Diagnosis present

## 2011-07-01 DIAGNOSIS — L0390 Cellulitis, unspecified: Secondary | ICD-10-CM

## 2011-07-01 DIAGNOSIS — G4733 Obstructive sleep apnea (adult) (pediatric): Secondary | ICD-10-CM | POA: Diagnosis present

## 2011-07-01 DIAGNOSIS — R000 Tachycardia, unspecified: Secondary | ICD-10-CM

## 2011-07-01 DIAGNOSIS — N61 Mastitis without abscess: Secondary | ICD-10-CM | POA: Diagnosis present

## 2011-07-01 DIAGNOSIS — G4730 Sleep apnea, unspecified: Secondary | ICD-10-CM

## 2011-07-01 DIAGNOSIS — L03113 Cellulitis of right upper limb: Secondary | ICD-10-CM

## 2011-07-01 DIAGNOSIS — L03129 Acute lymphangitis of unspecified part of limb: Secondary | ICD-10-CM

## 2011-07-01 DIAGNOSIS — R921 Mammographic calcification found on diagnostic imaging of breast: Secondary | ICD-10-CM | POA: Diagnosis present

## 2011-07-01 DIAGNOSIS — Z6841 Body Mass Index (BMI) 40.0 and over, adult: Secondary | ICD-10-CM

## 2011-07-01 DIAGNOSIS — R509 Fever, unspecified: Secondary | ICD-10-CM

## 2011-07-01 DIAGNOSIS — N63 Unspecified lump in unspecified breast: Secondary | ICD-10-CM | POA: Diagnosis present

## 2011-07-01 DIAGNOSIS — I10 Essential (primary) hypertension: Secondary | ICD-10-CM | POA: Diagnosis present

## 2011-07-01 DIAGNOSIS — I872 Venous insufficiency (chronic) (peripheral): Secondary | ICD-10-CM | POA: Insufficient documentation

## 2011-07-01 DIAGNOSIS — N390 Urinary tract infection, site not specified: Secondary | ICD-10-CM | POA: Diagnosis present

## 2011-07-01 DIAGNOSIS — S21009A Unspecified open wound of unspecified breast, initial encounter: Secondary | ICD-10-CM

## 2011-07-01 HISTORY — DX: Morbid (severe) obesity due to excess calories: E66.01

## 2011-07-01 HISTORY — DX: Lymphedema, not elsewhere classified: I89.0

## 2011-07-01 HISTORY — DX: Depression, unspecified: F32.A

## 2011-07-01 HISTORY — DX: Mammographic calcification found on diagnostic imaging of breast: R92.1

## 2011-07-01 HISTORY — DX: Anxiety disorder, unspecified: F41.9

## 2011-07-01 HISTORY — DX: Essential (primary) hypertension: I10

## 2011-07-01 HISTORY — DX: Cellulitis, unspecified: L03.90

## 2011-07-01 HISTORY — DX: Major depressive disorder, single episode, unspecified: F32.9

## 2011-07-01 LAB — BASIC METABOLIC PANEL
BUN: 11 mg/dL (ref 6–23)
CO2: 22 mEq/L (ref 19–32)
GFR calc non Af Amer: 72 mL/min — ABNORMAL LOW (ref >90)
Glucose, Bld: 122 mg/dL — ABNORMAL HIGH (ref 70–99)
Potassium: 4.1 mEq/L (ref 3.5–5.1)

## 2011-07-01 LAB — WOUND CULTURE: Special Requests: NORMAL

## 2011-07-01 LAB — DIFFERENTIAL
Basophils Absolute: 0 10*3/uL (ref 0.0–0.1)
Eosinophils Absolute: 0 10*3/uL (ref 0.0–0.7)
Eosinophils Relative: 0 % (ref 0–5)
Lymphs Abs: 1.6 10*3/uL (ref 0.7–4.0)

## 2011-07-01 LAB — URINE MICROSCOPIC-ADD ON

## 2011-07-01 LAB — CBC
MCH: 30.6 pg (ref 26.0–34.0)
MCV: 91.6 fL (ref 78.0–100.0)
Platelets: 274 10*3/uL (ref 150–400)
RDW: 14 % (ref 11.5–15.5)

## 2011-07-01 LAB — CULTURE, BLOOD (ROUTINE X 2)
Culture  Setup Time: 201301100028
Culture: NO GROWTH
Culture: NO GROWTH

## 2011-07-01 LAB — URINALYSIS, ROUTINE W REFLEX MICROSCOPIC
Bilirubin Urine: NEGATIVE
Glucose, UA: NEGATIVE mg/dL
Hgb urine dipstick: NEGATIVE
Specific Gravity, Urine: 1.018 (ref 1.005–1.030)
Urobilinogen, UA: 0.2 mg/dL (ref 0.0–1.0)

## 2011-07-01 MED ORDER — ACETAMINOPHEN 325 MG PO TABS
650.0000 mg | ORAL_TABLET | Freq: Once | ORAL | Status: AC
  Administered 2011-07-01: 650 mg via ORAL
  Filled 2011-07-01: qty 2

## 2011-07-01 MED ORDER — SODIUM CHLORIDE 0.9 % IV SOLN
INTRAVENOUS | Status: DC
  Administered 2011-07-01 (×2): via INTRAVENOUS

## 2011-07-01 MED ORDER — VANCOMYCIN HCL 10 G IV SOLR
1000.0000 mg | Freq: Once | INTRAVENOUS | Status: DC
  Filled 2011-07-01: qty 1000

## 2011-07-01 MED ORDER — VANCOMYCIN HCL IN DEXTROSE 1-5 GM/200ML-% IV SOLN
1000.0000 mg | Freq: Once | INTRAVENOUS | Status: AC
  Administered 2011-07-01: 1000 mg via INTRAVENOUS
  Filled 2011-07-01: qty 200

## 2011-07-01 MED ORDER — DEXTROSE 5 % IV SOLN
1.0000 g | Freq: Once | INTRAVENOUS | Status: AC
  Administered 2011-07-01: 1 g via INTRAVENOUS
  Filled 2011-07-01: qty 10

## 2011-07-01 NOTE — ED Notes (Signed)
Urine sample obtained, at bedside. No order yet

## 2011-07-01 NOTE — ED Notes (Signed)
WJX:[id number]<WG> Expected date:07/01/11<BR> Expected time:12:26 PM<BR> Means of arrival:Ambulance<BR> Comments:<BR> EMS 70 GC, 61 yof influenza

## 2011-07-01 NOTE — ED Provider Notes (Signed)
History     CSN: [id number]  Arrival date & time 07/01/11  1239   First MD Initiated Contact with Patient 07/01/11 1432      Chief Complaint  Patient presents with  . Influenza    (Consider location/radiation/quality/duration/timing/severity/associated sxs/prior treatment) Patient is a 61 y.o. female presenting with flu symptoms. The history is provided by the patient and medical records. The history is limited by the condition of the patient.  Influenza Pertinent negatives include no chest pain, no abdominal pain, no headaches and no shortness of breath.   the patient is a 61 year old, morbidly obese female, who lives alone, who presents to emergency department saying.  I think I have the flu.  She states that she has had a fever, and chills.  She says that she cellulitis in her on in legs.  She said that she had the flu back in December but she was not hospitalized for.  She denies chest pain, cough, shortness breath, nausea, vomiting, diarrhea.  He does not use oxygen at home. Level 5  Caveat applies for fever, and tachycardia  Past Medical History  Diagnosis Date  . Obesities, morbid   . Hypertension   . Cellulitis     right breast, right arm, ble    No past surgical history on file.  No family history on file.  History  Substance Use Topics  . Smoking status: Not on file  . Smokeless tobacco: Not on file  . Alcohol Use:     OB History    Grav Para Term Preterm Abortions TAB SAB Ect Mult Living                  Review of Systems  Constitutional: Positive for fever and chills. Negative for diaphoresis.  HENT: Negative for congestion.   Respiratory: Negative for cough and shortness of breath.   Cardiovascular: Negative for chest pain.  Gastrointestinal: Negative for abdominal pain.  Genitourinary: Negative for dysuria.  Musculoskeletal: Negative for back pain.  Skin: Positive for rash.  Neurological: Negative for headaches.  Psychiatric/Behavioral: Negative for  confusion.  All other systems reviewed and are negative.    Allergies  Effexor  Home Medications   Current Outpatient Rx  Name Route Sig Dispense Refill  . ASPIRIN 325 MG PO TABS Oral Take 325 mg by mouth daily.    Marland Kitchen DOCUSATE SODIUM 100 MG PO CAPS Oral Take 100 mg by mouth 2 (two) times daily.    Marland Kitchen LISINOPRIL-HYDROCHLOROTHIAZIDE 20-25 MG PO TABS Oral Take 1 tablet by mouth daily.    . NEBIVOLOL HCL 10 MG PO TABS Oral Take 10 mg by mouth daily.    . SERTRALINE HCL 100 MG PO TABS Oral Take 100 mg by mouth daily.      BP 137/53  Pulse 95  Temp(Src) 101 F (38.3 C) (Oral)  Resp 22  Ht 5\' 3"  (1.6 m)  Wt 413 lb (187.336 kg)  BMI 73.16 kg/m2  SpO2 98%  Physical Exam  Constitutional: She is oriented to person, place, and time.       Morbidly obese  HENT:  Head: Normocephalic and atraumatic.  Eyes: Pupils are equal, round, and reactive to light.  Neck: Normal range of motion.  Cardiovascular: Regular rhythm and normal heart sounds.   No murmur heard.      Tachycardia  Pulmonary/Chest: Effort normal and breath sounds normal. No respiratory distress. She has no wheezes. She has no rales.  Abdominal: Soft. There is no tenderness.  Neurological:  She is alert and oriented to person, place, and time. No cranial nerve deficit.  Skin: Skin is warm and dry. Rash noted. There is erythema.       Entire right arm is erythematous and increased in temperature  Psychiatric: She has a normal mood and affect. Her behavior is normal.    ED Course  Procedures (including critical care time) 61 year old, female, who is morbidly obese, presents with fever, and tachycardia, and flulike symptoms.  We will perform a septic workup, and likely admit her to the hospital.  Pain at this time.  Will start antibiotics after a stick.  Testing is completed   Labs Reviewed  CBC  DIFFERENTIAL  BASIC METABOLIC PANEL  URINALYSIS, ROUTINE W REFLEX MICROSCOPIC  CULTURE, BLOOD (ROUTINE X 2)  CULTURE, BLOOD  (ROUTINE X 2)  PROCALCITONIN  LACTIC ACID, PLASMA   No results found.   No diagnosis found.    MDM  Urosepsis Fever tachycardia        Nicholes Stairs, MD 07/01/11 [phone]

## 2011-07-01 NOTE — ED Notes (Signed)
IV and blood draw attempted w/o success.  IV team is paged & informed.

## 2011-07-01 NOTE — ED Notes (Signed)
Diagnosis of flu at Christmas.  Been fine since.  This morning patient woke up feeling increased weakness and fever.  Denies nausea, vomiting and diarrhea. Patient is morbidly obese and arrives saturated in urine.

## 2011-07-01 NOTE — ED Notes (Signed)
Laboratory workup has come back showing marked leukocytosis. I feel that her cellulitis of her right arm is the more likely cause of extreme leukocytosis than her UTI, although both may be contributing. Case is discussed with Dr. Ashley Royalty of [facility] who agrees to admit the patient and requests that I initiate temporary admitting orders.  Results for orders placed during the hospital encounter of 07/01/11  CBC      Component Value Range   WBC 25.6 (*) 4.0 - 10.5 (K/uL)   RBC 4.15  3.87 - 5.11 (MIL/uL)   Hemoglobin 12.7  12.0 - 15.0 (g/dL)   HCT 16.1  09.6 - 04.5 (%)   MCV 91.6  78.0 - 100.0 (fL)   MCH 30.6  26.0 - 34.0 (pg)   MCHC 33.4  30.0 - 36.0 (g/dL)   RDW 40.9  81.1 - 91.4 (%)   Platelets 274  150 - 400 (K/uL)  DIFFERENTIAL      Component Value Range   Neutrophils Relative 90 (*) 43 - 77 (%)   Neutro Abs 22.9 (*) 1.7 - 7.7 (K/uL)   Lymphocytes Relative 6 (*) 12 - 46 (%)   Lymphs Abs 1.6  0.7 - 4.0 (K/uL)   Monocytes Relative 4  3 - 12 (%)   Monocytes Absolute 1.0  0.1 - 1.0 (K/uL)   Eosinophils Relative 0  0 - 5 (%)   Eosinophils Absolute 0.0  0.0 - 0.7 (K/uL)   Basophils Relative 0  0 - 1 (%)   Basophils Absolute 0.0  0.0 - 0.1 (K/uL)   WBC Morphology WHITE COUNT CONFIRMED ON SMEAR     RBC Morphology POLYCHROMASIA PRESENT     Smear Review PLATELET COUNT CONFIRMED BY SMEAR    BASIC METABOLIC PANEL      Component Value Range   Sodium 132 (*) 135 - 145 (mEq/L)   Potassium 4.1  3.5 - 5.1 (mEq/L)   Chloride 96  96 - 112 (mEq/L)   CO2 22  19 - 32 (mEq/L)   Glucose, Bld 122 (*) 70 - 99 (mg/dL)   BUN 11  6 - 23 (mg/dL)   Creatinine, Ser 7.82  0.50 - 1.10 (mg/dL)   Calcium 9.4  8.4 - 95.6 (mg/dL)   GFR calc non Af Amer 72 (*) >90 (mL/min)   GFR calc Af Amer 83 (*) >90 (mL/min)  URINALYSIS, ROUTINE W REFLEX MICROSCOPIC      Component Value Range   Color, Urine YELLOW  YELLOW    APPearance CLOUDY (*) CLEAR    Specific Gravity, Urine 1.018  1.005 - 1.030    pH 6.0   5.0 - 8.0    Glucose, UA NEGATIVE  NEGATIVE (mg/dL)   Hgb urine dipstick NEGATIVE  NEGATIVE    Bilirubin Urine NEGATIVE  NEGATIVE    Ketones, ur NEGATIVE  NEGATIVE (mg/dL)   Protein, ur NEGATIVE  NEGATIVE (mg/dL)   Urobilinogen, UA 0.2  0.0 - 1.0 (mg/dL)   Nitrite POSITIVE (*) NEGATIVE    Leukocytes, UA SMALL (*) NEGATIVE   PROCALCITONIN      Component Value Range   Procalcitonin 0.84    LACTIC ACID, PLASMA      Component Value Range   Lactic Acid, Venous 2.0  0.5 - 2.2 (mmol/L)  URINE MICROSCOPIC-ADD ON      Component Value Range   Squamous Epithelial / LPF FEW (*) RARE    WBC, UA 11-20  <3 (WBC/hpf)   Bacteria, UA MANY (*) RARE  Dg Chest Port 1 View  07/01/2011  *RADIOLOGY REPORT*  Clinical Data:  Shortness of breath.  Fever.  PORTABLE CHEST - 1 VIEW  Comparison: 04/06/2007.  Findings:  1538 hours. The cardiopericardial silhouette is enlarged. There is pulmonary vascular congestion without overt pulmonary edema. Telemetry leads overlie the chest.  IMPRESSION: Cardiomegaly with vascular congestion and probable interstitial pulmonary edema.  Original Report Authenticated By: ERIC A. MANSELL, M.D.      Dione Booze, MD 07/01/11 [phone]

## 2011-07-01 NOTE — H&P (Signed)
Hospital Admission Note Date: 07/01/2011  Patient name: [name] Medical record number: [id number] Date of birth: 01-02-1951 Age: 61 y.o. Gender: female PCP: Alva Garnet., MD  Attending physician: Baltazar Najjar  Chief Complaint:Fever and chills x 1 day History of Present Illness:Pt states that on christmas day she noted chills and fever but thought that she was getting the flu. She states that symptoms subsided and she was "fine". The patient did not see her PMD in the interim. This morning pt awoke with fever and chills Tmax 102. She states that she was so weak, that she felt as though she would die if she didn't get some help. Thus she called EMS. The patient didn't notice the redness on her arm until brought to her attention by the Paramedic. She also states that she had a mammogram in sept which showed calcifications of her breast which have started to erode through the skin. The erosion on her right lateral breast has been there for over 1 month but she has not sought medical attention for this. Scheduled Meds:   . acetaminophen  650 mg Oral Once  . cefTRIAXone (ROCEPHIN)  IV  1 g Intravenous Once  . vancomycin  1,000 mg Intravenous Once  . DISCONTD: vancomycin  1,000 mg Intravenous Once   Continuous Infusions:   . sodium chloride 125 mL/hr at 07/01/11 1920   PRN Meds:. Allergies: Effexor Past Medical History  Diagnosis Date  . Obesities, morbid   . Hypertension   . Cellulitis     right breast, right arm, ble   No past surgical history on file. No family history on file. History   Social History  . Marital Status: Married    Spouse Name: N/A    Number of Children: N/A  . Years of Education: N/A   Occupational History  . Not on file.   Social History Main Topics  . Smoking status: Not on file  . Smokeless tobacco: Not on file  . Alcohol Use:   . Drug Use:   . Sexually Active:    Other Topics Concern  . Not on file   Social History Narrative  . No  narrative on file   Review of Systems: A comprehensive review of systems was negative, except as noted in history present illness Physical Exam: No intake or output data in the 24 hours ending 07/01/11 2130 General: Morbidly obese female. Alert, awake, oriented x3, in no acute distress.  HEENT: /AT PEERL, EOMI Neck: Trachea midline,  no masses, no thyromegal,y no JVD, no carotid bruit OROPHARYNX:  Moist, No exudate/ erythema/lesions.  Heart: Regular rate and rhythm, without murmurs, rubs, gallops, PMI non-displaced, no heaves or thrills on palpation.  Lungs: Clear to auscultation, no wheezing or rhonchi noted. No increased vocal fremitus resonant to percussion  Abdomen: Soft, nontender, nondistended, positive bowel sounds, no masses no hepatosplenomegaly noted..  Neuro: No focal neurological deficits noted cranial nerves II through XII grossly intact. DTRs 2+ bilaterally upper and lower extremities. Strength 5 out of 5 in bilateral upper and lower extremities. Musculoskeletal: Patient has extensive erythema and warmth on the right upper forearm and arm. Psychiatric: Patient alert and oriented x3, good insight and cognition, good recent to remote recall. Lymph node survey: No cervical axillary or inguinal lymphadenopathy noted. Skin: Patient has a peau d'orange consistency of the right breast. She also has a lesion of the right breast which showed evidence of the described calcifications. However it appears to be infected and has a foul smelling  discharge from it.  Lab results:  Basename 07/01/11 1630  NA 132*  K 4.1  CL 96  CO2 22  GLUCOSE 122*  BUN 11  CREATININE 0.85  CALCIUM 9.4  MG --  PHOS --   No results found for this basename: AST:2,ALT:2,ALKPHOS:2,BILITOT:2,PROT:2,ALBUMIN:2 in the last 72 hours No results found for this basename: LIPASE:2,AMYLASE:2 in the last 72 hours  Basename 07/01/11 1630  WBC 25.6*  NEUTROABS 22.9*  HGB 12.7  HCT 38.0  MCV 91.6  PLT 274    No results found for this basename: CKTOTAL:3,CKMB:3,CKMBINDEX:3,TROPONINI:3 in the last 72 hours No components found with this basename: POCBNP:3 No results found for this basename: DDIMER:2 in the last 72 hours No results found for this basename: HGBA1C:2 in the last 72 hours No results found for this basename: CHOL:2,HDL:2,LDLCALC:2,TRIG:2,CHOLHDL:2,LDLDIRECT:2 in the last 72 hours No results found for this basename: TSH,T4TOTAL,FREET3,T3FREE,THYROIDAB in the last 72 hours No results found for this basename: VITAMINB12:2,FOLATE:2,FERRITIN:2,TIBC:2,IRON:2,RETICCTPCT:2 in the last 72 hours Imaging results:  Dg Chest Port 1 View  07/01/2011  *RADIOLOGY REPORT*  Clinical Data:  Shortness of breath.  Fever.  PORTABLE CHEST - 1 VIEW  Comparison: 04/06/2007.  Findings:  1538 hours. The cardiopericardial silhouette is enlarged. There is pulmonary vascular congestion without overt pulmonary edema. Telemetry leads overlie the chest.  IMPRESSION: Cardiomegaly with vascular congestion and probable interstitial pulmonary edema.  Original Report Authenticated By: ERIC A. MANSELL, M.D.   Other results:    Patient Active Hospital Problem List: Cellulitis of arm, right (07/01/2011)   Assessment: Patient has been started on respiratory antibiotics including vancomycin and Zosyn.     Cellulitis of breast (07/01/2011)   Assessment: Patient has a poor rods appearance of the breast and a lesion which appears to have an infected exudate from it. I will consult general surgery for further evaluation of the breasts. I would also ask wound ostomy care to see the patient for local wound management.    BMI 70 and over, adult (07/01/2011)   Assessment: Nutrition consult    HTN (hypertension) (07/01/2011)   Assessment: Continue current medications    Breast calcifications (07/01/2011)   Assessment: We'll likely need to request results of last mammogram and recommendations from outpatient breast Center.       Kairi Tufo A. 07/01/2011, 9:30 PM

## 2011-07-02 ENCOUNTER — Encounter (HOSPITAL_COMMUNITY): Payer: Self-pay

## 2011-07-02 MED ORDER — ACETAMINOPHEN 325 MG PO TABS
650.0000 mg | ORAL_TABLET | Freq: Four times a day (QID) | ORAL | Status: DC | PRN
  Administered 2011-07-02: 650 mg via ORAL
  Filled 2011-07-02: qty 2

## 2011-07-02 MED ORDER — ENOXAPARIN SODIUM 40 MG/0.4ML ~~LOC~~ SOLN: 40.0000 mg | SUBCUTANEOUS | Status: DC

## 2011-07-02 MED ORDER — PIPERACILLIN-TAZOBACTAM 4.5 G IVPB
4.5000 g | Freq: Four times a day (QID) | INTRAVENOUS | Status: DC
  Filled 2011-07-02 (×3): qty 100

## 2011-07-02 MED ORDER — VANCOMYCIN HCL 1000 MG IV SOLR
1500.0000 mg | Freq: Two times a day (BID) | INTRAVENOUS | Status: DC
  Administered 2011-07-02 – 2011-07-03 (×4): 1500 mg via INTRAVENOUS
  Filled 2011-07-02 (×5): qty 1500

## 2011-07-02 MED ORDER — INFLUENZA VIRUS VACC SPLIT PF IM SUSP
0.5000 mL | INTRAMUSCULAR | Status: AC
  Filled 2011-07-02: qty 0.5

## 2011-07-02 MED ORDER — OXYCODONE HCL 5 MG PO TABS
10.0000 mg | ORAL_TABLET | ORAL | Status: DC | PRN
  Administered 2011-07-04 – 2011-07-06 (×5): 10 mg via ORAL
  Filled 2011-07-02 (×5): qty 2

## 2011-07-02 MED ORDER — DOCUSATE SODIUM 100 MG PO CAPS
100.0000 mg | ORAL_CAPSULE | Freq: Two times a day (BID) | ORAL | Status: DC
  Filled 2011-07-02 (×11): qty 1

## 2011-07-02 MED ORDER — ZOLPIDEM TARTRATE 5 MG PO TABS
5.0000 mg | ORAL_TABLET | Freq: Every evening | ORAL | Status: DC | PRN
  Administered 2011-07-02 – 2011-07-03 (×2): 5 mg via ORAL
  Filled 2011-07-02 (×2): qty 1

## 2011-07-02 MED ORDER — ENOXAPARIN SODIUM 100 MG/ML ~~LOC~~ SOLN
90.0000 mg | SUBCUTANEOUS | Status: DC
  Administered 2011-07-02: 100 mg via SUBCUTANEOUS
  Administered 2011-07-03 – 2011-07-06 (×4): 90 mg via SUBCUTANEOUS
  Filled 2011-07-02 (×6): qty 1

## 2011-07-02 MED ORDER — ONDANSETRON HCL 4 MG/2ML IJ SOLN: 4.0000 mg | Freq: Three times a day (TID) | INTRAMUSCULAR | Status: AC | PRN

## 2011-07-02 MED ORDER — ACETAMINOPHEN 650 MG RE SUPP: 650.0000 mg | Freq: Four times a day (QID) | RECTAL | Status: DC | PRN

## 2011-07-02 MED ORDER — PIPERACILLIN-TAZOBACTAM 3.375 G IVPB
3.3750 g | Freq: Three times a day (TID) | INTRAVENOUS | Status: DC
  Administered 2011-07-02 – 2011-07-05 (×12): 3.375 g via INTRAVENOUS
  Filled 2011-07-02 (×16): qty 50

## 2011-07-02 MED ORDER — SENNA 8.6 MG PO TABS
1.0000 | ORAL_TABLET | Freq: Two times a day (BID) | ORAL | Status: DC
  Administered 2011-07-02 – 2011-07-04 (×2): 8.6 mg via ORAL
  Filled 2011-07-02 (×3): qty 1

## 2011-07-02 MED ORDER — BISACODYL 10 MG RE SUPP: 10.0000 mg | Freq: Every day | RECTAL | Status: DC | PRN

## 2011-07-02 MED ORDER — METOPROLOL TARTRATE 25 MG/10 ML ORAL SUSPENSION
25.0000 mg | Freq: Two times a day (BID) | ORAL | Status: DC
  Administered 2011-07-02 – 2011-07-06 (×6): 25 mg via ORAL
  Filled 2011-07-02 (×13): qty 10

## 2011-07-02 MED ORDER — NEBIVOLOL HCL 10 MG PO TABS
10.0000 mg | ORAL_TABLET | Freq: Every day | ORAL | Status: DC
  Administered 2011-07-04 – 2011-07-06 (×3): 10 mg via ORAL
  Filled 2011-07-02 (×6): qty 1

## 2011-07-02 MED ORDER — ASPIRIN 325 MG PO TABS
325.0000 mg | ORAL_TABLET | Freq: Every day | ORAL | Status: DC
  Administered 2011-07-03 – 2011-07-06 (×4): 325 mg via ORAL
  Filled 2011-07-02 (×6): qty 1

## 2011-07-02 MED ORDER — HYDROCHLOROTHIAZIDE 25 MG PO TABS
25.0000 mg | ORAL_TABLET | Freq: Every day | ORAL | Status: DC
Start: 2011-07-02 — End: 2011-07-06
  Administered 2011-07-02 – 2011-07-06 (×4): 25 mg via ORAL
  Filled 2011-07-02 (×6): qty 1

## 2011-07-02 MED ORDER — ONDANSETRON HCL 4 MG/2ML IJ SOLN: 4.0000 mg | Freq: Four times a day (QID) | INTRAMUSCULAR | Status: DC | PRN

## 2011-07-02 MED ORDER — ACETAMINOPHEN 325 MG PO TABS
650.0000 mg | ORAL_TABLET | ORAL | Status: DC | PRN
  Administered 2011-07-02: 650 mg via ORAL
  Filled 2011-07-02: qty 2

## 2011-07-02 MED ORDER — SODIUM CHLORIDE 0.9 % IV SOLN: INTRAVENOUS | Status: DC

## 2011-07-02 MED ORDER — ONDANSETRON HCL 4 MG PO TABS: 4.0000 mg | ORAL_TABLET | Freq: Four times a day (QID) | ORAL | Status: DC | PRN

## 2011-07-02 MED ORDER — HYDROMORPHONE HCL PF 1 MG/ML IJ SOLN
1.0000 mg | INTRAMUSCULAR | Status: AC | PRN
  Administered 2011-07-02: 1 mg via INTRAVENOUS
  Filled 2011-07-02: qty 1

## 2011-07-02 MED ORDER — SODIUM CHLORIDE 0.9 % IV SOLN
INTRAVENOUS | Status: DC
  Administered 2011-07-02 – 2011-07-05 (×4): via INTRAVENOUS

## 2011-07-02 MED ORDER — LISINOPRIL-HYDROCHLOROTHIAZIDE 20-25 MG PO TABS: 1.0000 | ORAL_TABLET | Freq: Every day | ORAL | Status: DC

## 2011-07-02 MED ORDER — SENNOSIDES-DOCUSATE SODIUM 8.6-50 MG PO TABS
1.0000 | ORAL_TABLET | Freq: Every evening | ORAL | Status: DC | PRN
  Filled 2011-07-02 (×2): qty 1

## 2011-07-02 MED ORDER — PIPERACILLIN SOD-TAZOBACTAM SO 40.5 (36-4.5) G IV SOLR
Freq: Four times a day (QID) | INTRAVENOUS | Status: DC
  Filled 2011-07-02 (×3): qty 100

## 2011-07-02 MED ORDER — SERTRALINE HCL 100 MG PO TABS
100.0000 mg | ORAL_TABLET | Freq: Every day | ORAL | Status: DC
  Administered 2011-07-05 – 2011-07-06 (×2): 100 mg via ORAL
  Filled 2011-07-02 (×6): qty 1

## 2011-07-02 MED ORDER — LISINOPRIL 20 MG PO TABS
20.0000 mg | ORAL_TABLET | Freq: Every day | ORAL | Status: DC
  Administered 2011-07-02 – 2011-07-06 (×4): 20 mg via ORAL
  Filled 2011-07-02 (×6): qty 1

## 2011-07-02 NOTE — ED Notes (Signed)
Vital signs stable. 

## 2011-07-02 NOTE — Progress Notes (Signed)
Paged MD M. Lynch,PA about patients temp of 102.3.no new orders received.will continue to monitor patients.

## 2011-07-02 NOTE — Progress Notes (Signed)
ANTIBIOTIC CONSULT NOTE - INITIAL  Pharmacy Consult for Vancomycin Indication: Cellulitis  Allergies  Allergen Reactions  . Effexor (Venlafaxine Hydrochloride) Anxiety    Made her want to kill herself    Patient Measurements: Height: 5\' 3"  (160 cm) Weight: 413 lb (187.336 kg) IBW/kg (Calculated) : 52.4  Adjusted Body Weight: 106 kg  Vital Signs: Temp: 98.7 F (37.1 C) (01/10 0734) Temp src: Oral (01/10 0734) BP: 145/89 mmHg (01/10 0734) Pulse Rate: 82  (01/10 0734)  Labs:  Basename 07/01/11 1630  WBC 25.6*  HGB 12.7  PLT 274  LABCREA --  CREATININE 0.85   Estimated Creatinine Clearance: 115.3 ml/min (by C-G formula based on Cr of 0.85).  Microbiology: Recent Results (from the past 720 hour(s))  WOUND CULTURE     Status: Normal (Preliminary result)   Collection Time   07/01/11  9:45 PM      Component Value Range Status Comment   Specimen Description BREAST   Final    Special Requests Normal   Final    Gram Stain     Final    Value: RARE WBC PRESENT,BOTH PMN AND MONONUCLEAR     NO SQUAMOUS EPITHELIAL CELLS SEEN     RARE GRAM POSITIVE COCCI     IN PAIRS RARE GRAM NEGATIVE RODS   Culture PENDING   Incomplete    Report Status PENDING   Incomplete     Medical History: Past Medical History  Diagnosis Date  . Obesities, morbid   . Hypertension   . Cellulitis     right breast, right arm, ble    Medications:  Anti-infectives     Start     Dose/Rate Route Frequency Ordered Stop   07/02/11 0900   dextrose 5 % 100 mL with piperacillin-tazobactam (ZOSYN) 4.5 g infusion        200 mL/hr  Intravenous Every 6 hours 07/02/11 0813     07/02/11 0745   piperacillin-tazobactam (ZOSYN) IVPB 4.5 g  Status:  Discontinued        4.5 g 200 mL/hr over 30 Minutes Intravenous Every 6 hours 07/02/11 0741 07/02/11 0814   07/01/11 1930   vancomycin (VANCOCIN) IVPB 1000 mg/200 mL premix        1,000 mg 200 mL/hr over 60 Minutes Intravenous  Once 07/01/11 1836 07/01/11 2019   07/01/11 1845   vancomycin (VANCOCIN) injection 1,000 mg  Status:  Discontinued        1,000 mg Intravenous  Once 07/01/11 1813 07/01/11 1835   07/01/11 1545   cefTRIAXone (ROCEPHIN) 1 g in dextrose 5 % 50 mL IVPB        1 g 100 mL/hr over 30 Minutes Intravenous  Once 07/01/11 1533 07/01/11 1731         Assessment: 61 yo F admit with infection and cellulitis of the breast and arm.  She has calcifications of her breast which have started to erode through the skin. First abx: ceftriaxone 1g IV 1/9 at 1700, Vanc 1g 1/9 at 1900, zosyn 4.5g IV Q6 hours Renal function normal with CrCl (n) ~ 86 ml/min 1/9 Blood and wound cultures show rare GPC in pairs and rare GNR   Goal of Therapy:  Will aim for Vancomycin trough level 15-20 mcg/ml until deeper infection is ruled out.  If cellulitis alone, aim for Vancomycin trough level 10-15 mcg/ml  Plan:  Vancomycin 1500mg  IV Q 12 hrs Per P&T policy, change Zosyn to 3.375g IV Q8 hours extended infusion. Measure antibiotic drug levels  at steady state.   Follow up culture results, renal function, and labs as available.  Lynann Beaver PharmD Pager [phone] 07/02/2011 8:19 AM

## 2011-07-02 NOTE — Progress Notes (Signed)
Patient ID: [name], female   DOB: [birth year], 61 y.o.   MRN: [id number] Subjective: Patient seen. Denies any chest pain. Complain about mild shortness of breath. No palpitations.  Objective: Weight change:   Intake/Output Summary (Last 24 hours) at 07/02/11 0946 Last data filed at 07/02/11 0734  Gross per 24 hour  Intake      0 ml  Output    800 ml  Net   -800 ml   BP 145/89  Pulse 82  Temp(Src) 98.7 F (37.1 C) (Oral)  Resp 18  Ht 5\' 3"  (1.6 m)  Wt 187.336 kg (413 lb)  BMI 73.16 kg/m2  SpO2 93% Physical Exam: General appearance:mobility obese, alert, cooperative and no distress Head: Normocephalic, without obvious abnormality, atraumatic Neck: no adenopathy, no carotid bruit, no JVD, supple, symmetrical, trachea midline and thyroid not enlarged, symmetric, no tenderness/mass/nodules Lungs: decreased air entry bibasally secondary to body habitus. Heart: regular rate and rhythm, S1, S2 normal, no murmur, click, rub or gallop Abdomen: soft, morbidly obese,non-tender; bowel sounds normal; no masses,  no organomegaly Extremities: Pedal edema Skin: irregular areas of erythema right arm extending proximally.  Lab Results: Results for orders placed during the hospital encounter of 07/01/11 (from the past 48 hour(s))  PROCALCITONIN     Status: Normal   Collection Time   07/01/11  2:45 PM      Component Value Range Comment   Procalcitonin 0.84     URINALYSIS, ROUTINE W REFLEX MICROSCOPIC     Status: Abnormal   Collection Time   07/01/11  2:53 PM      Component Value Range Comment   Color, Urine YELLOW  YELLOW     APPearance CLOUDY (*) CLEAR     Specific Gravity, Urine 1.018  1.005 - 1.030     pH 6.0  5.0 - 8.0     Glucose, UA NEGATIVE  NEGATIVE (mg/dL)    Hgb urine dipstick NEGATIVE  NEGATIVE     Bilirubin Urine NEGATIVE  NEGATIVE     Ketones, ur NEGATIVE  NEGATIVE (mg/dL)    Protein, ur NEGATIVE  NEGATIVE (mg/dL)    Urobilinogen, UA 0.2  0.0 - 1.0 (mg/dL)    Nitrite  POSITIVE (*) NEGATIVE     Leukocytes, UA SMALL (*) NEGATIVE    URINE MICROSCOPIC-ADD ON     Status: Abnormal   Collection Time   07/01/11  2:53 PM      Component Value Range Comment   Squamous Epithelial / LPF FEW (*) RARE     WBC, UA 11-20  <3 (WBC/hpf)    Bacteria, UA MANY (*) RARE    CBC     Status: Abnormal   Collection Time   07/01/11  4:30 PM      Component Value Range Comment   WBC 25.6 (*) 4.0 - 10.5 (K/uL)    RBC 4.15  3.87 - 5.11 (MIL/uL)    Hemoglobin 12.7  12.0 - 15.0 (g/dL)    HCT 40.9  81.1 - 91.4 (%)    MCV 91.6  78.0 - 100.0 (fL)    MCH 30.6  26.0 - 34.0 (pg)    MCHC 33.4  30.0 - 36.0 (g/dL)    RDW 78.2  95.6 - 21.3 (%)    Platelets 274  150 - 400 (K/uL)   DIFFERENTIAL     Status: Abnormal   Collection Time   07/01/11  4:30 PM      Component Value Range Comment   Neutrophils Relative 90 (*)  43 - 77 (%)    Neutro Abs 22.9 (*) 1.7 - 7.7 (K/uL)    Lymphocytes Relative 6 (*) 12 - 46 (%)    Lymphs Abs 1.6  0.7 - 4.0 (K/uL)    Monocytes Relative 4  3 - 12 (%)    Monocytes Absolute 1.0  0.1 - 1.0 (K/uL)    Eosinophils Relative 0  0 - 5 (%)    Eosinophils Absolute 0.0  0.0 - 0.7 (K/uL)    Basophils Relative 0  0 - 1 (%)    Basophils Absolute 0.0  0.0 - 0.1 (K/uL)    WBC Morphology WHITE COUNT CONFIRMED ON SMEAR      RBC Morphology POLYCHROMASIA PRESENT      Smear Review PLATELET COUNT CONFIRMED BY SMEAR     BASIC METABOLIC PANEL     Status: Abnormal   Collection Time   07/01/11  4:30 PM      Component Value Range Comment   Sodium 132 (*) 135 - 145 (mEq/L)    Potassium 4.1  3.5 - 5.1 (mEq/L)    Chloride 96  96 - 112 (mEq/L)    CO2 22  19 - 32 (mEq/L)    Glucose, Bld 122 (*) 70 - 99 (mg/dL)    BUN 11  6 - 23 (mg/dL)    Creatinine, Ser 6.57  0.50 - 1.10 (mg/dL)    Calcium 9.4  8.4 - 10.5 (mg/dL)    GFR calc non Af Amer 72 (*) >90 (mL/min)    GFR calc Af Amer 83 (*) >90 (mL/min)   LACTIC ACID, PLASMA     Status: Normal   Collection Time   07/01/11  4:30 PM       Component Value Range Comment   Lactic Acid, Venous 2.0  0.5 - 2.2 (mmol/L)   WOUND CULTURE     Status: Normal (Preliminary result)   Collection Time   07/01/11  9:45 PM      Component Value Range Comment   Specimen Description BREAST      Special Requests Normal      Gram Stain        Value: RARE WBC PRESENT,BOTH PMN AND MONONUCLEAR     NO SQUAMOUS EPITHELIAL CELLS SEEN     RARE GRAM POSITIVE COCCI     IN PAIRS RARE GRAM NEGATIVE RODS   Culture PENDING      Report Status PENDING       Micro Results: Recent Results (from the past 240 hour(s))  WOUND CULTURE     Status: Normal (Preliminary result)   Collection Time   07/01/11  9:45 PM      Component Value Range Status Comment   Specimen Description BREAST   Final    Special Requests Normal   Final    Gram Stain     Final    Value: RARE WBC PRESENT,BOTH PMN AND MONONUCLEAR     NO SQUAMOUS EPITHELIAL CELLS SEEN     RARE GRAM POSITIVE COCCI     IN PAIRS RARE GRAM NEGATIVE RODS   Culture PENDING   Incomplete    Report Status PENDING   Incomplete     Studies/Results: Dg Chest Port 1 View  07/01/2011  *RADIOLOGY REPORT*  Clinical Data:  Shortness of breath.  Fever.  PORTABLE CHEST - 1 VIEW  Comparison: 04/06/2007.  Findings:  1538 hours. The cardiopericardial silhouette is enlarged. There is pulmonary vascular congestion without overt pulmonary edema. Telemetry leads overlie the chest.  IMPRESSION: Cardiomegaly with vascular congestion and probable interstitial pulmonary edema.  Original Report Authenticated By: ERIC A. MANSELL, M.D.   Medications: Scheduled Meds:   . acetaminophen  650 mg Oral Once  . cefTRIAXone (ROCEPHIN)  IV  1 g Intravenous Once  . enoxaparin (LOVENOX) injection  90 mg Subcutaneous Q24H  . piperacillin-tazobactam (ZOSYN)  IV  3.375 g Intravenous Q8H  . senna  1 tablet Oral BID  . vancomycin  1,500 mg Intravenous Q12H  . vancomycin  1,000 mg Intravenous Once  . DISCONTD: dextrose 5 % 100 mL with  piperacillin-tazobactam (ZOSYN) 4.5 g infusion   Intravenous Q6H  . DISCONTD: enoxaparin  40 mg Subcutaneous Q24H  . DISCONTD: piperacillin-tazobactam (ZOSYN)  IV  4.5 g Intravenous Q6H  . DISCONTD: vancomycin  1,000 mg Intravenous Once   Continuous Infusions:   . sodium chloride 75 mL/hr at 07/02/11 0902  . DISCONTD: sodium chloride 125 mL/hr at 07/01/11 1920   PRN Meds:.acetaminophen, acetaminophen, acetaminophen, bisacodyl, HYDROmorphone (DILAUDID) injection, ondansetron (ZOFRAN) IV, ondansetron (ZOFRAN) IV, ondansetron, oxyCODONE, senna-docusate, zolpidem  Assessment/Plan: #1 Cellulitis of arm, right-with continued IV Zosyn and vancomycin. #2 Moderate obesity #3 HTN (hypertension)-start Lopressor. #4 Shortness of breath-will order d-dimer and if positive will CT thorax to rule out PE #5 Chronic venostasis lower extremity.   LOS: 1 day   Aja Bolander 07/02/2011, 9:46 AM

## 2011-07-02 NOTE — ED Notes (Signed)
Patient is resting comfortably. 

## 2011-07-03 LAB — DIFFERENTIAL
Basophils Absolute: 0 10*3/uL (ref 0.0–0.1)
Eosinophils Absolute: 0.2 10*3/uL (ref 0.0–0.7)
Lymphocytes Relative: 13 % (ref 12–46)
Monocytes Relative: 6 % (ref 3–12)
Neutrophils Relative %: 80 % — ABNORMAL HIGH (ref 43–77)

## 2011-07-03 LAB — COMPREHENSIVE METABOLIC PANEL
ALT: 14 U/L (ref 0–35)
AST: 31 U/L (ref 0–37)
Albumin: 2.4 g/dL — ABNORMAL LOW (ref 3.5–5.2)
Calcium: 8.9 mg/dL (ref 8.4–10.5)
GFR calc Af Amer: 67 mL/min — ABNORMAL LOW (ref >90)
Glucose, Bld: 90 mg/dL (ref 70–99)
Sodium: 133 mEq/L — ABNORMAL LOW (ref 135–145)
Total Protein: 7.1 g/dL (ref 6.0–8.3)

## 2011-07-03 LAB — CBC
MCHC: 33.3 g/dL (ref 30.0–36.0)
RDW: 14.1 % (ref 11.5–15.5)
WBC: 15.7 10*3/uL — ABNORMAL HIGH (ref 4.0–10.5)

## 2011-07-03 LAB — HEMOGLOBIN A1C
Hgb A1c MFr Bld: 6.2 % — ABNORMAL HIGH (ref <5.7)
Mean Plasma Glucose: 131 mg/dL — ABNORMAL HIGH (ref <117)

## 2011-07-03 MED ORDER — VANCOMYCIN HCL 1000 MG IV SOLR
1250.0000 mg | Freq: Two times a day (BID) | INTRAVENOUS | Status: DC
  Administered 2011-07-04 – 2011-07-05 (×4): 1250 mg via INTRAVENOUS
  Filled 2011-07-03 (×7): qty 1250

## 2011-07-03 NOTE — Clinical Documentation Improvement (Signed)
Abnormal Labs Clarification  THIS DOCUMENT IS NOT A PERMANENT PART OF THE MEDICAL RECORD  TO RESPOND TO THE THIS QUERY, FOLLOW THE INSTRUCTIONS BELOW:  1. If needed, update documentation for the patient's encounter via the notes activity.  2. Access this query again and click edit on the [employer].  3. After updating, or not, click F2 to complete all highlighted (required) fields concerning your review. Select "additional documentation in the medical record" OR "no additional documentation provided".  4. Click Sign note button.  5. The deficiency will fall out of your InBasket *Please let us know if you are not able to complete this workflow by phone or e-mail (listed below).  Please update your documentation within the medical record to reflect your response to this query.                                                                                   07/03/11  Dear Dr. Ashley Royalty Marton Redwood  In a better effort to capture your patient's severity of illness, reflect appropriate length of stay and utilization of resources, a review of the medical record has revealed the following indicators.    Based on your clinical judgment, please clarify and document in a progress note and/or discharge summary the clinical condition associated with the following supporting information:  In responding to this query please exercise your independent judgment.  The fact that a query is asked, does not imply that any particular answer is desired or expected.  Abnormal findings laboratory are not coded and reported unless the physician indicates their clinical significance.   The medical record reflects the following clinical findings, please clarify the diagnostic and/or clinical significance:     ED notes on 07/01/11 " cellulitis of  right arm  more likely cause of extreme leukocytosis than her UTI, although both may be contributing". Please clarify in progress notes whether or not UTI is considered  as an underlying diagnosis for the clinical findings below  Possible Clinical Conditions?  Other Condition___________________                Cannot Clinically Determine_________    Clinical  Findings:   Signs & Symptoms:  Fever and chills  Temp 102.3   Lab:  WBC 25.6   UA  Nitrite POSITIVE         Bacteria, UA MANY   Treatment activity:  Scheduled  Medications while  in ED : According to H&P on 07/01/11     .  acetaminophen   650 mg  Oral  Once   .  cefTRIAXone (ROCEPHIN)  IV   1 g  Intravenous  Once   .  vancomycin   1,000 mg  Intravenous  Once   .  DISCONTD: vancomycin   1,000 mg  Intravenous  Once    Continuous Infusions:     .  sodium chloride  125 mL/hr at 07/01/11 1920     Reviewed: additional documentation in the medical record   Thank You,  Andy Gauss RN  Clinical Documentation Specialist:  Pager [phone] E-mail [email or web address]  [facility]

## 2011-07-03 NOTE — Progress Notes (Signed)
Patient ID: [name], female   DOB: [birth year], 61 y.o.   MRN: [id number] Patient ID: [name], female   DOB: [birth year], 61 y.o.   MRN: [id number] Subjective: Patient seen. Feels better. The erythema in the right resolving slowly WBC count trending down.  Objective: Weight change: 0 kg (0 lb)  Intake/Output Summary (Last 24 hours) at 07/03/11 1913 Last data filed at 07/03/11 1300  Gross per 24 hour  Intake    940 ml  Output   3900 ml  Net  -2960 ml   BP 128/66  Pulse 93  Temp(Src) 98.8 F (37.1 C) (Oral)  Resp 20  Ht 5\' 3"  (1.6 m)  Wt 187.336 kg (413 lb)  BMI 73.16 kg/m2  SpO2 96% Physical Exam: General appearance:mobility obese, alert, cooperative and no distress Head: Normocephalic, without obvious abnormality, atraumatic Neck: no adenopathy, no carotid bruit, no JVD, supple, symmetrical, trachea midline and thyroid not enlarged, symmetric, no tenderness/mass/nodules Lungs: decreased air entry bibasally secondary to body habitus.Firm mass beneath the right breast with peau de orange and area of ulceration outer quadrant of the right breast Heart: regular rate and rhythm, S1, S2 normal, no murmur, click, rub or gallop Abdomen: soft, morbidly obese,non-tender; bowel sounds normal; no masses,  no organomegaly Extremities: Pedal edema Skin: irregular areas of erythema right arm extending proximally-Resolving.  Lab Results: Results for orders placed during the hospital encounter of 07/01/11 (from the past 48 hour(s))  WOUND CULTURE     Status: Normal (Preliminary result)   Collection Time   07/01/11  9:45 PM      Component Value Range Comment   Specimen Description BREAST      Special Requests Normal      Gram Stain        Value: RARE WBC PRESENT,BOTH PMN AND MONONUCLEAR     NO SQUAMOUS EPITHELIAL CELLS SEEN     RARE GRAM POSITIVE COCCI     IN PAIRS RARE GRAM NEGATIVE RODS   Culture Culture reincubated for better growth      Report Status PENDING     HEMOGLOBIN A1C      Status: Abnormal   Collection Time   07/02/11 10:00 AM      Component Value Range Comment   Hemoglobin A1C 6.2 (*) <5.7 (%)    Mean Plasma Glucose 131 (*) <117 (mg/dL)   D-DIMER, QUANTITATIVE     Status: Abnormal   Collection Time   07/02/11 10:57 AM      Component Value Range Comment   D-Dimer, Quant 2.03 (*) 0.00 - 0.48 (ug/mL-FEU)   CBC     Status: Abnormal   Collection Time   07/03/11  4:22 AM      Component Value Range Comment   WBC 15.7 (*) 4.0 - 10.5 (K/uL) WHITE COUNT CONFIRMED ON SMEAR   RBC 3.67 (*) 3.87 - 5.11 (MIL/uL)    Hemoglobin 11.3 (*) 12.0 - 15.0 (g/dL)    HCT 78.2 (*) 95.6 - 46.0 (%)    MCV 92.4  78.0 - 100.0 (fL)    MCH 30.8  26.0 - 34.0 (pg)    MCHC 33.3  30.0 - 36.0 (g/dL)    RDW 21.3  08.6 - 57.8 (%)    Platelets 212  150 - 400 (K/uL)   DIFFERENTIAL     Status: Abnormal   Collection Time   07/03/11  4:22 AM      Component Value Range Comment   Neutrophils Relative 80 (*) 43 - 77 (%)  Lymphocytes Relative 13  12 - 46 (%)    Monocytes Relative 6  3 - 12 (%)    Eosinophils Relative 1  0 - 5 (%)    Basophils Relative 0  0 - 1 (%)    Neutro Abs 12.6 (*) 1.7 - 7.7 (K/uL)    Lymphs Abs 2.0  0.7 - 4.0 (K/uL)    Monocytes Absolute 0.9  0.1 - 1.0 (K/uL)    Eosinophils Absolute 0.2  0.0 - 0.7 (K/uL)    Basophils Absolute 0.0  0.0 - 0.1 (K/uL)    RBC Morphology POLYCHROMASIA PRESENT      WBC Morphology TOXIC GRANULATION   VACUOLATED NEUTROPHILS  COMPREHENSIVE METABOLIC PANEL     Status: Abnormal   Collection Time   07/03/11  4:22 AM      Component Value Range Comment   Sodium 133 (*) 135 - 145 (mEq/L)    Potassium 3.7  3.5 - 5.1 (mEq/L)    Chloride 99  96 - 112 (mEq/L)    CO2 24  19 - 32 (mEq/L)    Glucose, Bld 90  70 - 99 (mg/dL)    BUN 12  6 - 23 (mg/dL)    Creatinine, Ser 1.61  0.50 - 1.10 (mg/dL)    Calcium 8.9  8.4 - 10.5 (mg/dL)    Total Protein 7.1  6.0 - 8.3 (g/dL)    Albumin 2.4 (*) 3.5 - 5.2 (g/dL)    AST 31  0 - 37 (U/L)    ALT 14  0 - 35  (U/L)    Alkaline Phosphatase 84  39 - 117 (U/L)    Total Bilirubin 0.5  0.3 - 1.2 (mg/dL)    GFR calc non Af Amer 58 (*) >90 (mL/min)    GFR calc Af Amer 67 (*) >90 (mL/min)     Micro Results: Recent Results (from the past 240 hour(s))  CULTURE, BLOOD (ROUTINE X 2)     Status: Normal (Preliminary result)   Collection Time   07/01/11  4:25 PM      Component Value Range Status Comment   Specimen Description BLOOD LEFT WRIST   Final    Special Requests BOTTLES DRAWN AEROBIC AND ANAEROBIC 5CC   Final    Setup Time [id number]   Final    Culture     Final    Value:        BLOOD CULTURE RECEIVED NO GROWTH TO DATE CULTURE WILL BE HELD FOR 5 DAYS BEFORE ISSUING A FINAL NEGATIVE REPORT   Report Status PENDING   Incomplete   CULTURE, BLOOD (ROUTINE X 2)     Status: Normal (Preliminary result)   Collection Time   07/01/11  4:30 PM      Component Value Range Status Comment   Specimen Description BLOOD LEFT ARM   Final    Special Requests BOTTLES DRAWN AEROBIC ONLY 4CC   Final    Setup Time [id number]   Final    Culture     Final    Value:        BLOOD CULTURE RECEIVED NO GROWTH TO DATE CULTURE WILL BE HELD FOR 5 DAYS BEFORE ISSUING A FINAL NEGATIVE REPORT   Report Status PENDING   Incomplete   WOUND CULTURE     Status: Normal (Preliminary result)   Collection Time   07/01/11  9:45 PM      Component Value Range Status Comment   Specimen Description BREAST   Final    Special  Requests Normal   Final    Gram Stain     Final    Value: RARE WBC PRESENT,BOTH PMN AND MONONUCLEAR     NO SQUAMOUS EPITHELIAL CELLS SEEN     RARE GRAM POSITIVE COCCI     IN PAIRS RARE GRAM NEGATIVE RODS   Culture Culture reincubated for better growth   Final    Report Status PENDING   Incomplete     Studies/Results: Dg Chest Port 1 View  07/01/2011  *RADIOLOGY REPORT*  Clinical Data:  Shortness of breath.  Fever.  PORTABLE CHEST - 1 VIEW  Comparison: 04/06/2007.  Findings:  1538 hours. The cardiopericardial  silhouette is enlarged. There is pulmonary vascular congestion without overt pulmonary edema. Telemetry leads overlie the chest.  IMPRESSION: Cardiomegaly with vascular congestion and probable interstitial pulmonary edema.  Original Report Authenticated By: ERIC A. MANSELL, M.D.   Medications: Scheduled Meds:    . aspirin  325 mg Oral Daily  . docusate sodium  100 mg Oral BID  . enoxaparin (LOVENOX) injection  90 mg Subcutaneous Q24H  . lisinopril  20 mg Oral Daily   And  . hydrochlorothiazide  25 mg Oral Daily  . influenza  inactive virus vaccine  0.5 mL Intramuscular Tomorrow-1000  . metoprolol tartrate  25 mg Oral BID  . nebivolol  10 mg Oral Daily  . piperacillin-tazobactam (ZOSYN)  IV  3.375 g Intravenous Q8H  . senna  1 tablet Oral BID  . sertraline  100 mg Oral Daily  . vancomycin  1,500 mg Intravenous Q12H   Continuous Infusions:    . sodium chloride 75 mL/hr at 07/03/11 0500   PRN Meds:.acetaminophen, acetaminophen, acetaminophen, bisacodyl, HYDROmorphone (DILAUDID) injection, ondansetron (ZOFRAN) IV, ondansetron (ZOFRAN) IV, ondansetron, oxyCODONE, senna-docusate, zolpidem  Assessment/Plan: #1 Cellulitis of arm, right-Resolving.will continue IV Zosyn and vancomycin. #2 Firm Right breast mass with peau du orange/ulceration?Breast ca. #3 Moderate obesity #4 HTN (hypertension)-will continue Lopressor. #5 Chronic venostasis lower extremity.   LOS: 2 days   Teala Daffron 07/03/2011, 7:13 PM

## 2011-07-03 NOTE — Progress Notes (Signed)
ANTIBIOTIC CONSULT NOTE - FOLLOW UP  Pharmacy Consult for Vancomycin Indication: cellulitis  Allergies  Allergen Reactions  . Effexor (Venlafaxine Hydrochloride) Anxiety    Made her want to kill herself    Patient Measurements: Height: 5\' 3"  (160 cm) Weight: 413 lb (187.336 kg) IBW/kg (Calculated) : 52.4 kg  Vital Signs: Temp: 98.6 F (37 C) (01/11 2124) Temp src: Oral (01/11 2124) BP: 107/66 mmHg (01/11 2124) Pulse Rate: 97  (01/11 2124) Intake/Output from previous day: 01/10 0701 - 01/11 0700 In: 840 [P.O.:240; I.V.:600] Out: 4300 [Urine:4300] Intake/Output from this shift:    Labs:  Basename 07/03/11 0422 07/01/11 1630  WBC 15.7* 25.6*  HGB 11.3* 12.7  PLT 212 274  LABCREA -- --  CREATININE 1.02 0.85   Estimated Creatinine Clearance: 96.1 ml/min (by C-G formula based on Cr of 1.02).  Basename 07/03/11 2047  VANCOTROUGH 20.0  VANCOPEAK --  VANCORANDOM --  GENTTROUGH --  GENTPEAK --  GENTRANDOM --  TOBRATROUGH --  TOBRAPEAK --  TOBRARND --  AMIKACINPEAK --  AMIKACINTROU --  AMIKACIN --     Microbiology: Recent Results (from the past 720 hour(s))  CULTURE, BLOOD (ROUTINE X 2)     Status: Normal (Preliminary result)   Collection Time   07/01/11  4:25 PM      Component Value Range Status Comment   Specimen Description BLOOD LEFT WRIST   Final    Special Requests BOTTLES DRAWN AEROBIC AND ANAEROBIC 5CC   Final    Setup Time [id number]   Final    Culture     Final    Value:        BLOOD CULTURE RECEIVED NO GROWTH TO DATE CULTURE WILL BE HELD FOR 5 DAYS BEFORE ISSUING A FINAL NEGATIVE REPORT   Report Status PENDING   Incomplete   CULTURE, BLOOD (ROUTINE X 2)     Status: Normal (Preliminary result)   Collection Time   07/01/11  4:30 PM      Component Value Range Status Comment   Specimen Description BLOOD LEFT ARM   Final    Special Requests BOTTLES DRAWN AEROBIC ONLY 4CC   Final    Setup Time [id number]   Final    Culture     Final    Value:         BLOOD CULTURE RECEIVED NO GROWTH TO DATE CULTURE WILL BE HELD FOR 5 DAYS BEFORE ISSUING A FINAL NEGATIVE REPORT   Report Status PENDING   Incomplete   WOUND CULTURE     Status: Normal (Preliminary result)   Collection Time   07/01/11  9:45 PM      Component Value Range Status Comment   Specimen Description BREAST   Final    Special Requests Normal   Final    Gram Stain     Final    Value: RARE WBC PRESENT,BOTH PMN AND MONONUCLEAR     NO SQUAMOUS EPITHELIAL CELLS SEEN     RARE GRAM POSITIVE COCCI     IN PAIRS RARE GRAM NEGATIVE RODS   Culture Culture reincubated for better growth   Final    Report Status PENDING   Incomplete     Anti-infectives     Start     Dose/Rate Route Frequency Ordered Stop   07/02/11 1000   vancomycin (VANCOCIN) 1,500 mg in sodium chloride 0.9 % 500 mL IVPB        1,500 mg 250 mL/hr over 120 Minutes Intravenous Every 12 hours 07/02/11 0831  07/02/11 0900   dextrose 5 % 100 mL with piperacillin-tazobactam (ZOSYN) 4.5 g infusion  Status:  Discontinued        200 mL/hr  Intravenous Every 6 hours 07/02/11 0813 07/02/11 0830   07/02/11 0900  piperacillin-tazobactam (ZOSYN) IVPB 3.375 g       3.375 g 12.5 mL/hr over 240 Minutes Intravenous 3 times per day 07/02/11 0831     07/02/11 0745   piperacillin-tazobactam (ZOSYN) IVPB 4.5 g  Status:  Discontinued        4.5 g 200 mL/hr over 30 Minutes Intravenous Every 6 hours 07/02/11 0741 07/02/11 0814   07/01/11 1930   vancomycin (VANCOCIN) IVPB 1000 mg/200 mL premix        1,000 mg 200 mL/hr over 60 Minutes Intravenous  Once 07/01/11 1836 07/01/11 2019   07/01/11 1845   vancomycin (VANCOCIN) injection 1,000 mg  Status:  Discontinued        1,000 mg Intravenous  Once 07/01/11 1813 07/01/11 1835   07/01/11 1545   cefTRIAXone (ROCEPHIN) 1 g in dextrose 5 % 50 mL IVPB        1 g 100 mL/hr over 30 Minutes Intravenous  Once 07/01/11 1533 07/01/11 1731          Assessment: Vanco trough level above goal on  1500mg  q12h regimen. Pt afebrile  Goal of Therapy:  Vancomycin trough level 10-15 mcg/ml  Plan:  Will change regimen to Vancomycin 1250 mg IV Q12h Will recheck Vanco trough level at steady state.  Dorethea Clan 07/03/2011,10:23 PM

## 2011-07-04 ENCOUNTER — Inpatient Hospital Stay (HOSPITAL_COMMUNITY): Payer: Medicare Other

## 2011-07-04 ENCOUNTER — Encounter (HOSPITAL_COMMUNITY): Payer: Self-pay | Admitting: Surgery

## 2011-07-04 DIAGNOSIS — S21009A Unspecified open wound of unspecified breast, initial encounter: Secondary | ICD-10-CM

## 2011-07-04 DIAGNOSIS — L03129 Acute lymphangitis of unspecified part of limb: Secondary | ICD-10-CM

## 2011-07-04 LAB — COMPREHENSIVE METABOLIC PANEL
Alkaline Phosphatase: 75 U/L (ref 39–117)
BUN: 11 mg/dL (ref 6–23)
Chloride: 100 mEq/L (ref 96–112)
GFR calc Af Amer: 85 mL/min — ABNORMAL LOW (ref >90)
Glucose, Bld: 101 mg/dL — ABNORMAL HIGH (ref 70–99)
Potassium: 3.7 mEq/L (ref 3.5–5.1)
Total Bilirubin: 0.4 mg/dL (ref 0.3–1.2)
Total Protein: 7.2 g/dL (ref 6.0–8.3)

## 2011-07-04 LAB — DIFFERENTIAL
Eosinophils Absolute: 0.3 10*3/uL (ref 0.0–0.7)
Lymphs Abs: 1.4 10*3/uL (ref 0.7–4.0)
Monocytes Absolute: 0.7 10*3/uL (ref 0.1–1.0)
Monocytes Relative: 7 % (ref 3–12)
Neutrophils Relative %: 75 % (ref 43–77)

## 2011-07-04 LAB — CBC
HCT: 34.2 % — ABNORMAL LOW (ref 36.0–46.0)
Hemoglobin: 11.3 g/dL — ABNORMAL LOW (ref 12.0–15.0)
MCH: 30.2 pg (ref 26.0–34.0)
RBC: 3.74 MIL/uL — ABNORMAL LOW (ref 3.87–5.11)

## 2011-07-04 NOTE — Progress Notes (Signed)
Patient ID: [name], female   DOB: May 15, 1951, 61 y.o.   MRN: [id number] Patient ID: [name], female   DOB: 04-Aug-1950, 61 y.o.   MRN: [id number] Patient ID: [name], female   DOB: Nov 02, 1950, 61 y.o.   MRN: [id number] Subjective: Patient seen. Feels better. The erythema in the right arm is resolving as well as WBC count trending down. Attempts at getting patient to do CT chest or VQ scan was unsuccessful because of patient's size. Patient also requested  surgical evaluation for her right breast mass  Objective: Weight change:   Intake/Output Summary (Last 24 hours) at 07/04/11 1502 Last data filed at 07/04/11 1500  Gross per 24 hour  Intake 1404.3 ml  Output   1000 ml  Net  404.3 ml   BP 130/65  Pulse 76  Temp(Src) 98.4 F (36.9 C) (Oral)  Resp 18  Ht 5\' 3"  (1.6 m)  Wt 187.336 kg (413 lb)  BMI 73.16 kg/m2  SpO2 95% Physical Exam: General appearance:morbidly obese, alert, cooperative and no distress Head: Normocephalic, without obvious abnormality, atraumatic Neck: no adenopathy, no carotid bruit, no JVD, supple, symmetrical, trachea midline and thyroid not enlarged, symmetric, no tenderness/mass/nodules Lungs: decreased air entry bibasally secondary to body habitus.Firm mass beneath the right breast with peau de orange and area of ulceration outer quadrant of the right breast Heart: regular rate and rhythm, S1, S2 normal, no murmur, click, rub or gallop Abdomen: soft, morbidly obese,non-tender; bowel sounds normal; no masses,  no organomegaly Extremities: Pedal edema Skin: irregular areas of erythema right arm extending proximally-Resolving.  Lab Results: Results for orders placed during the hospital encounter of 07/01/11 (from the past 48 hour(s))  CBC     Status: Abnormal   Collection Time   07/03/11  4:22 AM      Component Value Range Comment   WBC 15.7 (*) 4.0 - 10.5 (K/uL) WHITE COUNT CONFIRMED ON SMEAR   RBC 3.67 (*) 3.87 - 5.11 (MIL/uL)    Hemoglobin 11.3  (*) 12.0 - 15.0 (g/dL)    HCT 08.6 (*) 57.8 - 46.0 (%)    MCV 92.4  78.0 - 100.0 (fL)    MCH 30.8  26.0 - 34.0 (pg)    MCHC 33.3  30.0 - 36.0 (g/dL)    RDW 46.9  62.9 - 52.8 (%)    Platelets 212  150 - 400 (K/uL)   DIFFERENTIAL     Status: Abnormal   Collection Time   07/03/11  4:22 AM      Component Value Range Comment   Neutrophils Relative 80 (*) 43 - 77 (%)    Lymphocytes Relative 13  12 - 46 (%)    Monocytes Relative 6  3 - 12 (%)    Eosinophils Relative 1  0 - 5 (%)    Basophils Relative 0  0 - 1 (%)    Neutro Abs 12.6 (*) 1.7 - 7.7 (K/uL)    Lymphs Abs 2.0  0.7 - 4.0 (K/uL)    Monocytes Absolute 0.9  0.1 - 1.0 (K/uL)    Eosinophils Absolute 0.2  0.0 - 0.7 (K/uL)    Basophils Absolute 0.0  0.0 - 0.1 (K/uL)    RBC Morphology POLYCHROMASIA PRESENT      WBC Morphology TOXIC GRANULATION   VACUOLATED NEUTROPHILS  COMPREHENSIVE METABOLIC PANEL     Status: Abnormal   Collection Time   07/03/11  4:22 AM      Component Value Range Comment   Sodium 133 (*)  135 - 145 (mEq/L)    Potassium 3.7  3.5 - 5.1 (mEq/L)    Chloride 99  96 - 112 (mEq/L)    CO2 24  19 - 32 (mEq/L)    Glucose, Bld 90  70 - 99 (mg/dL)    BUN 12  6 - 23 (mg/dL)    Creatinine, Ser 4.54  0.50 - 1.10 (mg/dL)    Calcium 8.9  8.4 - 10.5 (mg/dL)    Total Protein 7.1  6.0 - 8.3 (g/dL)    Albumin 2.4 (*) 3.5 - 5.2 (g/dL)    AST 31  0 - 37 (U/L)    ALT 14  0 - 35 (U/L)    Alkaline Phosphatase 84  39 - 117 (U/L)    Total Bilirubin 0.5  0.3 - 1.2 (mg/dL)    GFR calc non Af Amer 58 (*) >90 (mL/min)    GFR calc Af Amer 67 (*) >90 (mL/min)   VANCOMYCIN, TROUGH     Status: Normal   Collection Time   07/03/11  8:47 PM      Component Value Range Comment   Vancomycin Tr 20.0  10.0 - 20.0 (ug/mL)   CBC     Status: Abnormal   Collection Time   07/04/11  5:14 AM      Component Value Range Comment   WBC 10.0  4.0 - 10.5 (K/uL)    RBC 3.74 (*) 3.87 - 5.11 (MIL/uL)    Hemoglobin 11.3 (*) 12.0 - 15.0 (g/dL)    HCT 09.8 (*)  11.9 - 46.0 (%)    MCV 91.4  78.0 - 100.0 (fL)    MCH 30.2  26.0 - 34.0 (pg)    MCHC 33.0  30.0 - 36.0 (g/dL)    RDW 14.7  82.9 - 56.2 (%)    Platelets 215  150 - 400 (K/uL)   DIFFERENTIAL     Status: Normal   Collection Time   07/04/11  5:14 AM      Component Value Range Comment   Neutrophils Relative 75  43 - 77 (%)    Neutro Abs 7.5  1.7 - 7.7 (K/uL)    Lymphocytes Relative 14  12 - 46 (%)    Lymphs Abs 1.4  0.7 - 4.0 (K/uL)    Monocytes Relative 7  3 - 12 (%)    Monocytes Absolute 0.7  0.1 - 1.0 (K/uL)    Eosinophils Relative 3  0 - 5 (%)    Eosinophils Absolute 0.3  0.0 - 0.7 (K/uL)    Basophils Relative 0  0 - 1 (%)    Basophils Absolute 0.0  0.0 - 0.1 (K/uL)   COMPREHENSIVE METABOLIC PANEL     Status: Abnormal   Collection Time   07/04/11  5:14 AM      Component Value Range Comment   Sodium 133 (*) 135 - 145 (mEq/L)    Potassium 3.7  3.5 - 5.1 (mEq/L)    Chloride 100  96 - 112 (mEq/L)    CO2 24  19 - 32 (mEq/L)    Glucose, Bld 101 (*) 70 - 99 (mg/dL)    BUN 11  6 - 23 (mg/dL)    Creatinine, Ser 1.30  0.50 - 1.10 (mg/dL)    Calcium 8.9  8.4 - 10.5 (mg/dL)    Total Protein 7.2  6.0 - 8.3 (g/dL)    Albumin 2.4 (*) 3.5 - 5.2 (g/dL)    AST 21  0 - 37 (U/L)  ALT 15  0 - 35 (U/L)    Alkaline Phosphatase 75  39 - 117 (U/L)    Total Bilirubin 0.4  0.3 - 1.2 (mg/dL)    GFR calc non Af Amer 73 (*) >90 (mL/min)    GFR calc Af Amer 85 (*) >90 (mL/min)     Micro Results: Recent Results (from the past 240 hour(s))  CULTURE, BLOOD (ROUTINE X 2)     Status: Normal (Preliminary result)   Collection Time   07/01/11  4:25 PM      Component Value Range Status Comment   Specimen Description BLOOD LEFT WRIST   Final    Special Requests BOTTLES DRAWN AEROBIC AND ANAEROBIC 5CC   Final    Setup Time [id number]   Final    Culture     Final    Value:        BLOOD CULTURE RECEIVED NO GROWTH TO DATE CULTURE WILL BE HELD FOR 5 DAYS BEFORE ISSUING A FINAL NEGATIVE REPORT   Report Status  PENDING   Incomplete   CULTURE, BLOOD (ROUTINE X 2)     Status: Normal (Preliminary result)   Collection Time   07/01/11  4:30 PM      Component Value Range Status Comment   Specimen Description BLOOD LEFT ARM   Final    Special Requests BOTTLES DRAWN AEROBIC ONLY 4CC   Final    Setup Time [id number]   Final    Culture     Final    Value:        BLOOD CULTURE RECEIVED NO GROWTH TO DATE CULTURE WILL BE HELD FOR 5 DAYS BEFORE ISSUING A FINAL NEGATIVE REPORT   Report Status PENDING   Incomplete   WOUND CULTURE     Status: Normal   Collection Time   07/01/11  9:45 PM      Component Value Range Status Comment   Specimen Description BREAST   Final    Special Requests Normal   Final    Gram Stain     Final    Value: RARE WBC PRESENT,BOTH PMN AND MONONUCLEAR     NO SQUAMOUS EPITHELIAL CELLS SEEN     RARE GRAM POSITIVE COCCI     IN PAIRS RARE GRAM NEGATIVE RODS   Culture     Final    Value: MULTIPLE ORGANISMS PRESENT, NONE PREDOMINANT     Note: NO STAPHYLOCOCCUS AUREUS ISOLATED NO GROUP A STREP (S.PYOGENES) ISOLATED   Report Status 07/04/2011 FINAL   Final     Studies/Results: Dg Chest Port 1 View  07/01/2011  *RADIOLOGY REPORT*  Clinical Data:  Shortness of breath.  Fever.  PORTABLE CHEST - 1 VIEW  Comparison: 04/06/2007.  Findings:  1538 hours. The cardiopericardial silhouette is enlarged. There is pulmonary vascular congestion without overt pulmonary edema. Telemetry leads overlie the chest.  IMPRESSION: Cardiomegaly with vascular congestion and probable interstitial pulmonary edema.  Original Report Authenticated By: ERIC A. MANSELL, M.D.   Medications: Scheduled Meds:    . aspirin  325 mg Oral Daily  . docusate sodium  100 mg Oral BID  . enoxaparin (LOVENOX) injection  90 mg Subcutaneous Q24H  . lisinopril  20 mg Oral Daily   And  . hydrochlorothiazide  25 mg Oral Daily  . influenza  inactive virus vaccine  0.5 mL Intramuscular Tomorrow-1000  . metoprolol tartrate  25 mg Oral BID    . nebivolol  10 mg Oral Daily  . piperacillin-tazobactam (ZOSYN)  IV  3.375  g Intravenous Q8H  . senna  1 tablet Oral BID  . sertraline  100 mg Oral Daily  . vancomycin  1,250 mg Intravenous Q12H  . DISCONTD: vancomycin  1,500 mg Intravenous Q12H   Continuous Infusions:    . sodium chloride 75 mL/hr at 07/04/11 0630   PRN Meds:.acetaminophen, acetaminophen, acetaminophen, bisacodyl, ondansetron (ZOFRAN) IV, ondansetron, oxyCODONE, senna-docusate, zolpidem  Assessment/Plan: #1 Cellulitis of arm, right-Resolving.will continue IV Zosyn and vancomycin. #2 Firm Right breast mass with peau du orange/ulceration?Breast ca. I consulted general surgery-Dr. Michaell Cowing to evaluate patient. #3 Moderate obesity #4 HTN (hypertension)-will continue Lopressor. BP fairly stable  #5 Chronic venostasis lower extremity. I consulted general surgery-Dr. Michaell Cowing to evaluate patient and he agreed to evaluate patient this weekend.  LOS: 3 days   Nissa Stannard 07/04/2011, 3:02 PM

## 2011-07-05 DIAGNOSIS — N61 Mastitis without abscess: Secondary | ICD-10-CM

## 2011-07-05 DIAGNOSIS — I891 Lymphangitis: Secondary | ICD-10-CM

## 2011-07-05 LAB — COMPREHENSIVE METABOLIC PANEL
AST: 26 U/L (ref 0–37)
Albumin: 2.3 g/dL — ABNORMAL LOW (ref 3.5–5.2)
Alkaline Phosphatase: 73 U/L (ref 39–117)
Chloride: 99 mEq/L (ref 96–112)
Potassium: 4.9 mEq/L (ref 3.5–5.1)
Total Bilirubin: 0.4 mg/dL (ref 0.3–1.2)

## 2011-07-05 LAB — CBC
Platelets: 198 10*3/uL (ref 150–400)
RDW: 14.2 % (ref 11.5–15.5)
WBC: 10.9 10*3/uL — ABNORMAL HIGH (ref 4.0–10.5)

## 2011-07-05 NOTE — Progress Notes (Signed)
Patient ID: Krislynn Gronau, female   DOB: 08-18-1950, 61 y.o.   MRN: [id number] Patient ID: Solita Macadam, female   DOB: 10/09/1950, 61 y.o.   MRN: [id number] Patient ID: Adaleena Mooers, female   DOB: 03/23/1951, 61 y.o.   MRN: [id number] Patient ID: Francille Wittmann, female   DOB: 05/11/1951, 61 y.o.   MRN: [id number] Subjective: Patient seen. Evaluated by the surgeon yesterday and appreciated. No new complaints.  Objective: Weight change:   Intake/Output Summary (Last 24 hours) at 07/05/11 1550 Last data filed at 07/05/11 1400  Gross per 24 hour  Intake 2952.5 ml  Output   1850 ml  Net 1102.5 ml   BP 102/56  Pulse 82  Temp(Src) 98 F (36.7 C) (Oral)  Resp 18  Ht 5\' 3"  (1.6 m)  Wt 187.336 kg (413 lb)  BMI 73.16 kg/m2  SpO2 96% Physical Exam: General appearance:morbidly obese, alert, cooperative and no distress Head: Normocephalic, without obvious abnormality, atraumatic Neck: no adenopathy, no carotid bruit, no JVD, supple, symmetrical, trachea midline and thyroid not enlarged, symmetric, no tenderness/mass/nodules Lungs: decreased air entry bibasally secondary to body habitus.Firm mass beneath the right breast with peau de orange and area of ulceration outer quadrant of the right breast Heart: regular rate and rhythm, S1, S2 normal, no murmur, click, rub or gallop Abdomen: soft, morbidly obese,non-tender; bowel sounds normal; no masses,  no organomegaly Extremities: Pedal edema Skin: irregular areas of erythema right arm extending proximally-Resolving.  Lab Results: Results for orders placed during the hospital encounter of 07/01/11 (from the past 48 hour(s))  VANCOMYCIN, TROUGH     Status: Normal   Collection Time   07/03/11  8:47 PM      Component Value Range Comment   Vancomycin Tr 20.0  10.0 - 20.0 (ug/mL)   CBC     Status: Abnormal   Collection Time   07/04/11  5:14 AM      Component Value Range Comment   WBC 10.0  4.0 - 10.5 (K/uL)    RBC 3.74 (*) 3.87 - 5.11 (MIL/uL)    Hemoglobin 11.3 (*) 12.0 - 15.0 (g/dL)    HCT 62.9 (*) 52.8 - 46.0 (%)    MCV 91.4  78.0 - 100.0 (fL)    MCH 30.2  26.0 - 34.0 (pg)    MCHC 33.0  30.0 - 36.0 (g/dL)    RDW 41.3  24.4 - 01.0 (%)    Platelets 215  150 - 400 (K/uL)   DIFFERENTIAL     Status: Normal   Collection Time   07/04/11  5:14 AM      Component Value Range Comment   Neutrophils Relative 75  43 - 77 (%)    Neutro Abs 7.5  1.7 - 7.7 (K/uL)    Lymphocytes Relative 14  12 - 46 (%)    Lymphs Abs 1.4  0.7 - 4.0 (K/uL)    Monocytes Relative 7  3 - 12 (%)    Monocytes Absolute 0.7  0.1 - 1.0 (K/uL)    Eosinophils Relative 3  0 - 5 (%)    Eosinophils Absolute 0.3  0.0 - 0.7 (K/uL)    Basophils Relative 0  0 - 1 (%)    Basophils Absolute 0.0  0.0 - 0.1 (K/uL)   COMPREHENSIVE METABOLIC PANEL     Status: Abnormal   Collection Time   07/04/11  5:14 AM      Component Value Range Comment   Sodium 133 (*) 135 - 145 (mEq/L)  Potassium 3.7  3.5 - 5.1 (mEq/L)    Chloride 100  96 - 112 (mEq/L)    CO2 24  19 - 32 (mEq/L)    Glucose, Bld 101 (*) 70 - 99 (mg/dL)    BUN 11  6 - 23 (mg/dL)    Creatinine, Ser 1.19  0.50 - 1.10 (mg/dL)    Calcium 8.9  8.4 - 10.5 (mg/dL)    Total Protein 7.2  6.0 - 8.3 (g/dL)    Albumin 2.4 (*) 3.5 - 5.2 (g/dL)    AST 21  0 - 37 (U/L)    ALT 15  0 - 35 (U/L)    Alkaline Phosphatase 75  39 - 117 (U/L)    Total Bilirubin 0.4  0.3 - 1.2 (mg/dL)    GFR calc non Af Amer 73 (*) >90 (mL/min)    GFR calc Af Amer 85 (*) >90 (mL/min)   CBC     Status: Abnormal   Collection Time   07/05/11  4:18 AM      Component Value Range Comment   WBC 10.9 (*) 4.0 - 10.5 (K/uL)    RBC 3.92  3.87 - 5.11 (MIL/uL)    Hemoglobin 12.2  12.0 - 15.0 (g/dL)    HCT 14.7 (*) 82.9 - 46.0 (%)    MCV 91.6  78.0 - 100.0 (fL)    MCH 31.1  26.0 - 34.0 (pg)    MCHC 34.0  30.0 - 36.0 (g/dL)    RDW 56.2  13.0 - 86.5 (%)    Platelets 198  150 - 400 (K/uL)   COMPREHENSIVE METABOLIC PANEL     Status: Abnormal   Collection Time    07/05/11  4:18 AM      Component Value Range Comment   Sodium 129 (*) 135 - 145 (mEq/L)    Potassium 4.9  3.5 - 5.1 (mEq/L)    Chloride 99  96 - 112 (mEq/L)    CO2 19  19 - 32 (mEq/L)    Glucose, Bld 106 (*) 70 - 99 (mg/dL)    BUN 9  6 - 23 (mg/dL)    Creatinine, Ser 7.84  0.50 - 1.10 (mg/dL)    Calcium 8.7  8.4 - 10.5 (mg/dL)    Total Protein 7.2  6.0 - 8.3 (g/dL)    Albumin 2.3 (*) 3.5 - 5.2 (g/dL)    AST 26  0 - 37 (U/L) SLIGHT HEMOLYSIS   ALT 15  0 - 35 (U/L) SLIGHT HEMOLYSIS   Alkaline Phosphatase 73  39 - 117 (U/L) SLIGHT HEMOLYSIS   Total Bilirubin 0.4  0.3 - 1.2 (mg/dL)    GFR calc non Af Amer 77 (*) >90 (mL/min)    GFR calc Af Amer 90 (*) >90 (mL/min)     Micro Results: Recent Results (from the past 240 hour(s))  CULTURE, BLOOD (ROUTINE X 2)     Status: Normal (Preliminary result)   Collection Time   07/01/11  4:25 PM      Component Value Range Status Comment   Specimen Description BLOOD LEFT WRIST   Final    Special Requests BOTTLES DRAWN AEROBIC AND ANAEROBIC 5CC   Final    Setup Time [id number]   Final    Culture     Final    Value:        BLOOD CULTURE RECEIVED NO GROWTH TO DATE CULTURE WILL BE HELD FOR 5 DAYS BEFORE ISSUING A FINAL NEGATIVE REPORT   Report Status PENDING  Incomplete   CULTURE, BLOOD (ROUTINE X 2)     Status: Normal (Preliminary result)   Collection Time   07/01/11  4:30 PM      Component Value Range Status Comment   Specimen Description BLOOD LEFT ARM   Final    Special Requests BOTTLES DRAWN AEROBIC ONLY 4CC   Final    Setup Time [id number]   Final    Culture     Final    Value:        BLOOD CULTURE RECEIVED NO GROWTH TO DATE CULTURE WILL BE HELD FOR 5 DAYS BEFORE ISSUING A FINAL NEGATIVE REPORT   Report Status PENDING   Incomplete   WOUND CULTURE     Status: Normal   Collection Time   07/01/11  9:45 PM      Component Value Range Status Comment   Specimen Description BREAST   Final    Special Requests Normal   Final    Gram Stain     Final     Value: RARE WBC PRESENT,BOTH PMN AND MONONUCLEAR     NO SQUAMOUS EPITHELIAL CELLS SEEN     RARE GRAM POSITIVE COCCI     IN PAIRS RARE GRAM NEGATIVE RODS   Culture     Final    Value: MULTIPLE ORGANISMS PRESENT, NONE PREDOMINANT     Note: NO STAPHYLOCOCCUS AUREUS ISOLATED NO GROUP A STREP (S.PYOGENES) ISOLATED   Report Status 07/04/2011 FINAL   Final     Studies/Results: Dg Chest Port 1 View  07/01/2011  *RADIOLOGY REPORT*  Clinical Data:  Shortness of breath.  Fever.  PORTABLE CHEST - 1 VIEW  Comparison: 04/06/2007.  Findings:  1538 hours. The cardiopericardial silhouette is enlarged. There is pulmonary vascular congestion without overt pulmonary edema. Telemetry leads overlie the chest.  IMPRESSION: Cardiomegaly with vascular congestion and probable interstitial pulmonary edema.  Original Report Authenticated By: ERIC A. MANSELL, M.D.   Medications: Scheduled Meds:    . aspirin  325 mg Oral Daily  . docusate sodium  100 mg Oral BID  . enoxaparin (LOVENOX) injection  90 mg Subcutaneous Q24H  . lisinopril  20 mg Oral Daily   And  . hydrochlorothiazide  25 mg Oral Daily  . metoprolol tartrate  25 mg Oral BID  . nebivolol  10 mg Oral Daily  . piperacillin-tazobactam (ZOSYN)  IV  3.375 g Intravenous Q8H  . senna  1 tablet Oral BID  . sertraline  100 mg Oral Daily  . vancomycin  1,250 mg Intravenous Q12H   Continuous Infusions:    . sodium chloride 75 mL/hr at 07/04/11 2341   PRN Meds:.acetaminophen, acetaminophen, acetaminophen, bisacodyl, ondansetron (ZOFRAN) IV, ondansetron, oxyCODONE, senna-docusate, zolpidem  Assessment/Plan: #1 Cellulitis of arm, right-Resolving.will continue IV Zosyn and vancomycin. #2 Firm Right breast mass with peau du orange/ulceration?Breast ca. patient to follow up in the breast clinic for possible biopsy. Recommendation of the surgeon seen and effort appreciated #3 UTI-with continue present antibiotic regimen #4 Moderate obesity #5 HTN  (hypertension)-will continue Lopressor. BP fairly stable  #6 Chronic venostasis lower extremity. For discharge on 07/06/2011.  LOS: 4 days   Yailine Ballard 07/05/2011, 3:50 PM

## 2011-07-05 NOTE — Progress Notes (Signed)
Patients lower legs weeping and slight blood noted to sheet.  Bleeding area noted to lt lower leg.  Paged M. Lynch for orders concerning covering bil lower legs.  Awaiting response.  Paged respiratory concerning cpap.  Awaiting response.  Repaged at 2105.  Spoke with Daphane Shepherd concerning cpap order and dressing coverings to bil lower legs. Order given.  Respiratory repaged.

## 2011-07-05 NOTE — Progress Notes (Signed)
Patient refused cpap.  Respiratory in to set up and patient refused.  Hob elevated.

## 2011-07-05 NOTE — Consult Note (Signed)
NAMEADRIENNE, TROMBETTA              ACCOUNT NO.:  [id number]  MEDICAL RECORD NO.:  [id number]  LOCATION:  1523                         FACILITY:  [facility]  PHYSICIAN:  Ardeth Sportsman, MD     DATE OF BIRTH:  [birth year]  DATE OF CONSULTATION: DATE OF DISCHARGE:                                CONSULTATION   PRIMARY CARE PHYSICIAN:  Merlene Laughter. Renae Gloss, MD  REQUESTING PHYSICIAN:  Talmage Nap, MD of [facility].  SURGEON:  [facility].  REASON FOR EVALUATION:  Ulceration of right breast with history of breast cancer.  HISTORY OF PRESENT ILLNESS:  Ms. Caisse is a 61 year old super morbidly obese female, BMI around 18, who had breast surgery in 1988. It sounds like she had a right breast lumpectomy with axillary lymph node dissection and postsurgical radiation.  She thinks that she was lymph node negative.  She has had mammograms with chronic calcifications in her right breast.  Most recently one done in July, 2012.  She does have, it sounds like intermittent arm swelling on the right side, suspicious for intermittent lymphedema.  She thinks she has been sent for fittings, but given her very large other extremities, especially her arm much more than her forearm, she has not been able to be compliant with any sleeves so far.  It sounds like she had a similar episode of right arm swelling and redness, suspicious for cellulitis versus lymphangitis in 2008.  It resolved with antibiotics.  She was admitted 3 days ago after having a history of right arm pain and swelling.  She was admitted for presumed right upper extremity cellulitis.  The admitting MD noted also an ulceration with exposed calcifications.  Apparently, there is some foul drainage from there as well on admission.  Admission note talks about a consult.  Three days later, I got a call from another hospitalist over concerns of the open wound and possible peau d'orange of the right breast.  The patient  notes she has had calcifications in the skin of her breast for some time.  The old incision opened up about a month ago.  She denied having any other followup for this.  Weight had been otherwise stable.  PAST MEDICAL HISTORY: 1. Super morbid obesity with BMI around 70. 2. Hypertension. 3. Probable lymphedema of her arm at least 2008 and currently with     lymphangitis. 4. Depression. 5. Anxiety.  PAST SURGICAL HISTORY:  Limited by the lack of resources, but sounds like she had a wide local excisional biopsy of her right breast cancer with axillary lymph node dissection and post adjuvant radiation therapy in 1988.  She had a hysterectomy in 1991.  She had a release of her carpal tunnel in 1995.  ALLERGIES:  EFFEXOR.  This is not really a true allergy; this is more of an intolerant reaction.  MEDICATIONS:  Noted in the chart.  SOCIAL HISTORY:  Noted in the system and revealed that she used to smoke cigarettes, but no tobacco, alcohol, or other use.  She is married, in a stable relationship.  FAMILY HISTORY:  Both parents are deceased, but she cannot recall reasons.  REVIEW OF SYSTEMS:  Noted per HPI.  GENERAL:  No fever, chills, or sweats.  Her weight has been stable.  EYES, ENT:  Negative.  CARDIAC: No exertional chest pain, although she says she is very limited with mobility and it is a challenge to get to outpatient offices, but no cardiac events.  PULMONARY:  No recent cold, coughs, or flus.  ABDOMEN: No nausea, vomiting, or diarrhea.  BREASTS:  As noted above.  Left breast has not had any problems of any masses or pain or bleeding or nipple discharge.  No bleeding or nipple discharge on the right side. She has had chronic skin changes on her right breast since her radiation therapy that has not really changed.  MUSCULOSKELETAL:  Chronic joint pains, but otherwise unchanged.  HEPATIC, RENAL, ENDOCRINE, HEME, LYMPH, ALLERGIC:  Otherwise negative.  PHYSICAL EXAMINATION:   VITAL SIGNS: Her temperature is 98.4, pulse 76, respirations 18, blood pressure 130/65, and 98% saturation on room air. GENERAL:  She is a well-developed, super morbidly obese female, in no acute distress. PSYCH:  She seems pleasant with no evidence of any dementia, delirium, psychosis, or paranoia. EYES:  Pupils equal, round, and reactive to light.  Extraocular movements intact.  Sclerae nonicteric or injected. SKIN:  No petechiae, purpura, or sores or lesions aside from the right extremity right breast which will be detailed below. HEART:  Regular rate and rhythm.  No murmurs, clicks, rubs. CHEST:  Clear to auscultation bilaterally.  No wheezes or rhonchi. ABDOMEN:  Morbidly obese, but soft. GENITAL/RECTAL:  Deferred. EXTREMITIES:  She has lymphedema and a bright red rash, going from her distal forearm near the wrist, all the way up to her shoulder with some patchy elevation, consistent with lymphangitis/lymphedema/cellulitis. No head, neck, axillary, groin lymphadenopathy.  Her right axillary incision is intact. BREASTS:  She has a large left ptotic breast with soft breast mound with no breast masses.  No nipple discharge.  No nipple inversion or peau d'orange.    On the right side, she has a much smaller breast.  In her right upper quadrant, she has an open wound that is about 4 x 3 cm. There is yellow, hard rocky material eroding out c/w calcification.  There is no pus or purulence or foul odor coming from the wound itself.  She has chronic skin changes consistent with post-radiation changes on the lateral half of her breast.  The medial breast, especially the lower inner quadrant seems to be soft and more normal.  In the inferior part of her breast a few centimeters below her areola, she has some hyperpigmentation and some rocky granular material in the subcutaneous, consistent with calcifications there as well.  STUDIES:  Mammogram showed calcifications in July 2012.   Apparently, those were stable, but I cannot pull up the films myself.  ASSESSMENT AND PLAN:  Super morbidly obese female with a very distant history of breast cancer, treated by radiation and wide local excision, axillary lymph node dissection with chronic lymph edema, admitted for lymphangitis and eroding right breast mass. 1. Continue antibiotics. 2. Cultures do not show anything specific. 3. She needs to follow up in the breast center for lymphedema     evaluation.  She may need chronic arm ACE wraps placed since her arms     are too morbidly obese to tolerate lymphedema sleeves. 4. Chronic open wound.  This will be a challenge.  Obviously there is     a concern for malignancy.  I think when she is more stable, can be  discharged.  She can be sent to the breast center for ultrasound-     guided core biopsy to find some soft tissue.  I do not think it is     very well controlled to try and do core biopsies here at the     bedside with the material not available.  If there is evidence of     cancer, she may need wide local excision or even a mastectomy.     However, I am guarded against doing this in a prior radiated breast     as she could result with an even larger open wound as      radiated the breast intends to heal rather poorly.  She did not     seem to be acutely infected now, the wound is opened with no active     purulence, so there is no surgical emergency at this time.  I     discussed this with my partner, Dr. Emelia Loron, who is a     breast surgeon.  I will see if my partner coming on Monday has any     other insight.  I would hesitate to do any aggressive debridement     of that area given that they could result even larger open wound     complications.     Ardeth Sportsman, MD     SCG/MEDQ  D:  07/04/2011  T:  07/04/2011  Job:  [id number]

## 2011-07-06 ENCOUNTER — Other Ambulatory Visit (HOSPITAL_COMMUNITY): Payer: Medicare Other

## 2011-07-06 MED ORDER — FUROSEMIDE 40 MG PO TABS: 40.0000 mg | ORAL_TABLET | Freq: Two times a day (BID) | ORAL | Status: DC

## 2011-07-06 MED ORDER — INFLUENZA VIRUS VACC SPLIT PF IM SUSP
0.5000 mL | INTRAMUSCULAR | Status: AC
  Administered 2011-07-06: 0.5 mL via INTRAMUSCULAR
  Filled 2011-07-06: qty 0.5

## 2011-07-06 MED ORDER — SULFAMETHOXAZOLE-TRIMETHOPRIM 800-160 MG PO TABS: 1.0000 | ORAL_TABLET | Freq: Two times a day (BID) | ORAL | Status: AC

## 2011-07-06 MED ORDER — ACETAMINOPHEN 325 MG PO TABS: 650.0000 mg | ORAL_TABLET | Freq: Four times a day (QID) | ORAL | Status: AC | PRN

## 2011-07-06 MED ORDER — OXYCODONE HCL 10 MG PO TABS: 10.0000 mg | ORAL_TABLET | ORAL | Status: AC | PRN

## 2011-07-06 MED ORDER — GABAPENTIN 100 MG PO CAPS: 100.0000 mg | ORAL_CAPSULE | Freq: Every day | ORAL | Status: DC

## 2011-07-06 NOTE — Progress Notes (Signed)
Patients iv leaking at site.  dc'd iv site.  Pt refusing iv restart.  Patient states, she is leaving this am.  Patient verbalizes understanding of need for iv antibotics and that zoysn dose is not yet completely infused.  Patient verbalizes understanding. Still refusing iv restart and iv antibotics.  Notified M. Lynch.  Daphane Shepherd will inform pts attending.  No new orders at this time.  Daphane Shepherd aware patient will not be receiving 0600 dose of zoysn and 2200 dose not completely infused before iv site dc'd.  Note made and attending md to be informed of the above.

## 2011-07-06 NOTE — Progress Notes (Signed)
@   22:30 spoke with pt about wearing CPAP. Pt. States that she is going home in the a.m. And does not wish to wear at this time. Encouraged pt. To call RT is she would like to be placed on CPAP.

## 2011-07-06 NOTE — Discharge Summary (Signed)
DISCHARGE SUMMARY  [name]  MR#: [id number]  DOB:26-Dec-1950  Date of Admission: 07/01/2011 Date of Discharge: 07/06/2011  Attending Physician:Xiao Graul  Patient's WUJ:WJXBJYN,WGNFAOZH R., MD  Consults: General Surgery-Dr Gross  Discharge Diagnoses: #1 Cellulitis right arm. #2 Firm right breast mass with peau du orange/ulceration upper outer quadrant-patient is to follow up at the breast clinic as recommended by surgery #3 UTI #4 morbid obesity #5 hypertension #6 chronic venostasis lower extremity #7 history of obstructive sleep apnea on CPAP. #8 questionable pulmonary edema.  Present on Admission:  .Cellulitis of arm, right .Cellulitis of breast .Breast calcifications    Current Discharge Medication List    START taking these medications   Details  acetaminophen (TYLENOL) 325 MG tablet Take 2 tablets (650 mg total) by mouth every 6 (six) hours as needed (or Fever >/= 101). Qty: 30 tablet, Refills: 1    furosemide (LASIX) 40 MG tablet Take 1 tablet (40 mg total) by mouth 2 (two) times daily. Qty: 50 tablet, Refills: 1    gabapentin (NEURONTIN) 100 MG capsule Take 1 capsule (100 mg total) by mouth at bedtime. Qty: 30 capsule, Refills: 1    oxyCODONE 10 MG TABS Take 1 tablet (10 mg total) by mouth every 4 (four) hours as needed. Qty: 30 tablet, Refills: 0    sulfamethoxazole-trimethoprim (BACTRIM DS) 800-160 MG per tablet Take 1 tablet by mouth 2 (two) times daily. Qty: 20 tablet, Refills: 0      CONTINUE these medications which have NOT CHANGED   Details  aspirin 325 MG tablet Take 325 mg by mouth daily.    docusate sodium (COLACE) 100 MG capsule Take 100 mg by mouth 2 (two) times daily.    lisinopril-hydrochlorothiazide (PRINZIDE,ZESTORETIC) 20-25 MG per tablet Take 1 tablet by mouth daily.    nebivolol (BYSTOLIC) 10 MG tablet Take 10 mg by mouth daily.    sertraline (ZOLOFT) 100 MG tablet Take 100 mg by mouth daily.          Hospital  Course: Patient is a 61 year old Caucasian female morbidly obese, with history of hypertension and remote history of right breast surgery was admitted to the hospital on 07/01/2011 with complains of fever, redness on the right and there was said to be spreading proximally the above the right axilla. No history of chills or Rigors. No history of chest pain or shortness of breath. At time patient was seen by the admitting physician, her vitals were stable, had irregular areas of erythema in the right arm spreading proximally the breast with peau du orange right breast. She was also found to have offensive discharge from the ulcer in the upper outer quadrant. Hematologic indices done on patient showed leukocytosis with a left shift and chest x-ray showed pulmonary vascular congestion.    Patient was admitted to general medical floor. She was treatment for cellulitis of the right as well as lymphangitis with IV vancomycin as well as Zosyn. Fever was controlled with Tylenol. Patient blood pressure was maintained with lisinopril and Lopressor. She was also continued on hydrochlorothiazide. On the medication given to patient include Ambien for insomnia, and Colace for constipation. Pain control was done with oxycodone. Since patient has a history of obstructive sleep apnea she was allowed to her CPAP at night. Patient also was treated for urinary tract infection with IV Zosyn.   Patient was evaluated by me on daily basis. I consulted the surgeon because of the right breast finding and she was evaluated by general surgery-Dr. gross and he recommended  patient to follow up at the breast clinic for possible breast ultrasound and fine needle biopsy. The erythema in arm has shown remarkable resolution and the offensive discharge from the upper outer quadrant of the right breast was much less. WBC count on admission was 25,000 and on discharge wbc count is10,000. Patient's fever has resolved completely. Patient evaluated by  me today, complained about pins and needle sensation in the lower extremity. Vital signs are stable. Plan is for patient to be discharge home today.    Day of Discharge BP 112/69  Pulse 87  Temp(Src) 97.9 F (36.6 C) (Oral)  Resp 18  Ht 5\' 3"  (1.6 m)  Wt 187.336 kg (413 lb)  BMI 73.16 kg/m2  SpO2 95%  Physical Exam: Vitals as above HEENT-mild pallor. Breast-Firm mass right breast with ulceration upper outer quadrant. Neck-no lymphadenopathy Chest-decreased breath sound bibasilar secondary to body habitus CVS-S1 and S2 Abdomen-morbidly obese, organs difficult to palpate, bowel sounds are positive. Extremity-pedal edema Skin-resolving erythema right Neuro-non focal Neuropsych-appropriate affect.   No results found for this or any previous visit (from the past 24 hour(s)).  Disposition: stable   Follow-up Appts: Discharge Orders    Future Orders Please Complete By Expires   Diet - low sodium heart healthy      Increase activity slowly      Discharge instructions      Comments:   Follow up at the breast clinic in 1-2weeks Follow up with PCP in 1-2weeks      Signed: Boleslaus Holloway 07/06/2011, 4:36 PM

## 2011-07-06 NOTE — Progress Notes (Signed)
Patient ID: [name], female   DOB: February 22, 1951, 61 y.o.   MRN: [id number]    Subjective: Pt without c/o.  Objective: Vital signs in last 24 hours: Temp:  [97.9 F (36.6 C)-98 F (36.7 C)] 97.9 F (36.6 C) (01/14 0516) Pulse Rate:  [72-98] 73  (01/14 0516) Resp:  [18] 18  (01/14 0516) BP: (92-138)/(47-78) 92/47 mmHg (01/14 0516) SpO2:  [94 %-96 %] 94 % (01/14 0516) Last BM Date: 07/05/11  Intake/Output from previous day: 01/13 0701 - 01/14 0700 In: 2531 [P.O.:600; I.V.:1396.3; IV Piggyback:534.7] Out: 1650 [Urine:1650] Intake/Output this shift:    PE: Breast: right breast with open wound.  Calcified fibrin visible.  No purulent drainage.  Some mild serous drainage.  Minimal tenderness on the lateral portion of the wound.  Lab Results:   [facility] 07/05/11 0418 07/04/11 0514  WBC 10.9* 10.0  HGB 12.2 11.3*  HCT 35.9* 34.2*  PLT 198 215   BMET  Basename 07/05/11 0418 07/04/11 0514  NA 129* 133*  K 4.9 3.7  CL 99 100  CO2 19 24  GLUCOSE 106* 101*  BUN 9 11  CREATININE 0.80 0.84  CALCIUM 8.7 8.9   PT/INR No results found for this basename: LABPROT:2,INR:2 in the last 72 hours   Studies/Results: No results found.  Anti-infectives: Anti-infectives     Start     Dose/Rate Route Frequency Ordered Stop   07/04/11 1000   vancomycin (VANCOCIN) 1,250 mg in sodium chloride 0.9 % 250 mL IVPB        1,250 mg 166.7 mL/hr over 90 Minutes Intravenous Every 12 hours 07/03/11 2227     07/02/11 1000   vancomycin (VANCOCIN) 1,500 mg in sodium chloride 0.9 % 500 mL IVPB  Status:  Discontinued        1,500 mg 250 mL/hr over 120 Minutes Intravenous Every 12 hours 07/02/11 0831 07/03/11 2226   07/02/11 0900   dextrose 5 % 100 mL with piperacillin-tazobactam (ZOSYN) 4.5 g infusion  Status:  Discontinued        200 mL/hr  Intravenous Every 6 hours 07/02/11 0813 07/02/11 0830   07/02/11 0900  piperacillin-tazobactam (ZOSYN) IVPB 3.375 g       3.375 g 12.5 mL/hr over 240  Minutes Intravenous 3 times per day 07/02/11 0831     07/02/11 0745   piperacillin-tazobactam (ZOSYN) IVPB 4.5 g  Status:  Discontinued        4.5 g 200 mL/hr over 30 Minutes Intravenous Every 6 hours 07/02/11 0741 07/02/11 0814   07/01/11 1930   vancomycin (VANCOCIN) IVPB 1000 mg/200 mL premix        1,000 mg 200 mL/hr over 60 Minutes Intravenous  Once 07/01/11 1836 07/01/11 2019   07/01/11 1845   vancomycin (VANCOCIN) injection 1,000 mg  Status:  Discontinued        1,000 mg Intravenous  Once 07/01/11 1813 07/01/11 1835   07/01/11 1545   cefTRIAXone (ROCEPHIN) 1 g in dextrose 5 % 50 mL IVPB        1 g 100 mL/hr over 30 Minutes Intravenous  Once 07/01/11 1533 07/01/11 1731           Assessment/Plan  1. Chronic breast wound, s/p lumpectomy and radiation over 13 years ago.  Plan: 1. Pt will need a breast u/s at the breast center and follow up with one of our surgeons as an outpatient for possible core needle biopsy.  Her wound is currently clean and no surgical intervention is necessary  at this time.  I will have her follow up with one of our surgeons as an outpatient.  Please call with questions.   LOS: 5 days    Cheron Coryell E 07/06/2011

## 2011-07-06 NOTE — Progress Notes (Signed)
Patient provided with discharge teaching and prescriptions. Patient verbalized understanding. Patient discharged to home. 

## 2011-07-06 NOTE — Progress Notes (Signed)
I have seen and examined the patient and agree with the assessment and plans.  Quierra Silverio A. Alashia Brownfield  MD, FACS  

## 2011-09-01 ENCOUNTER — Encounter (HOSPITAL_COMMUNITY): Payer: Self-pay | Admitting: *Deleted

## 2011-09-01 ENCOUNTER — Inpatient Hospital Stay (HOSPITAL_COMMUNITY)
Admission: EM | Admit: 2011-09-01 | Discharge: 2011-09-08 | DRG: 584 | Disposition: A | Discharge status: Home / Self Care | Payer: Medicare Other | Attending: Internal Medicine | Admitting: Internal Medicine

## 2011-09-01 DIAGNOSIS — I8310 Varicose veins of unspecified lower extremity with inflammation: Secondary | ICD-10-CM | POA: Diagnosis present

## 2011-09-01 DIAGNOSIS — Z87891 Personal history of nicotine dependence: Secondary | ICD-10-CM

## 2011-09-01 DIAGNOSIS — F411 Generalized anxiety disorder: Secondary | ICD-10-CM | POA: Diagnosis present

## 2011-09-01 DIAGNOSIS — L03129 Acute lymphangitis of unspecified part of limb: Secondary | ICD-10-CM | POA: Diagnosis present

## 2011-09-01 DIAGNOSIS — B951 Streptococcus, group B, as the cause of diseases classified elsewhere: Secondary | ICD-10-CM | POA: Diagnosis present

## 2011-09-01 DIAGNOSIS — L03113 Cellulitis of right upper limb: Secondary | ICD-10-CM | POA: Diagnosis present

## 2011-09-01 DIAGNOSIS — R921 Mammographic calcification found on diagnostic imaging of breast: Secondary | ICD-10-CM | POA: Diagnosis present

## 2011-09-01 DIAGNOSIS — G8929 Other chronic pain: Secondary | ICD-10-CM | POA: Diagnosis present

## 2011-09-01 DIAGNOSIS — F3289 Other specified depressive episodes: Secondary | ICD-10-CM | POA: Diagnosis present

## 2011-09-01 DIAGNOSIS — R0902 Hypoxemia: Secondary | ICD-10-CM | POA: Diagnosis not present

## 2011-09-01 DIAGNOSIS — Z923 Personal history of irradiation: Secondary | ICD-10-CM

## 2011-09-01 DIAGNOSIS — Y838 Other surgical procedures as the cause of abnormal reaction of the patient, or of later complication, without mention of misadventure at the time of the procedure: Secondary | ICD-10-CM | POA: Diagnosis present

## 2011-09-01 DIAGNOSIS — T8189XA Other complications of procedures, not elsewhere classified, initial encounter: Secondary | ICD-10-CM | POA: Diagnosis present

## 2011-09-01 DIAGNOSIS — IMO0002 Reserved for concepts with insufficient information to code with codable children: Principal | ICD-10-CM | POA: Diagnosis present

## 2011-09-01 DIAGNOSIS — G4733 Obstructive sleep apnea (adult) (pediatric): Secondary | ICD-10-CM | POA: Diagnosis present

## 2011-09-01 DIAGNOSIS — S21009A Unspecified open wound of unspecified breast, initial encounter: Secondary | ICD-10-CM | POA: Diagnosis present

## 2011-09-01 DIAGNOSIS — Z853 Personal history of malignant neoplasm of breast: Secondary | ICD-10-CM

## 2011-09-01 DIAGNOSIS — M25569 Pain in unspecified knee: Secondary | ICD-10-CM | POA: Diagnosis present

## 2011-09-01 DIAGNOSIS — Z6841 Body Mass Index (BMI) 40.0 and over, adult: Secondary | ICD-10-CM

## 2011-09-01 DIAGNOSIS — I10 Essential (primary) hypertension: Secondary | ICD-10-CM | POA: Diagnosis present

## 2011-09-01 DIAGNOSIS — G589 Mononeuropathy, unspecified: Secondary | ICD-10-CM | POA: Diagnosis not present

## 2011-09-01 DIAGNOSIS — F329 Major depressive disorder, single episode, unspecified: Secondary | ICD-10-CM | POA: Diagnosis present

## 2011-09-01 DIAGNOSIS — N62 Hypertrophy of breast: Secondary | ICD-10-CM | POA: Diagnosis present

## 2011-09-01 DIAGNOSIS — N61 Mastitis without abscess: Secondary | ICD-10-CM | POA: Diagnosis present

## 2011-09-01 DIAGNOSIS — L988 Other specified disorders of the skin and subcutaneous tissue: Secondary | ICD-10-CM | POA: Diagnosis present

## 2011-09-01 DIAGNOSIS — N289 Disorder of kidney and ureter, unspecified: Secondary | ICD-10-CM | POA: Diagnosis not present

## 2011-09-01 DIAGNOSIS — E876 Hypokalemia: Secondary | ICD-10-CM | POA: Diagnosis not present

## 2011-09-01 HISTORY — DX: Malignant neoplasm of unspecified site of unspecified female breast: C50.919

## 2011-09-01 NOTE — ED Notes (Signed)
Per EMS- pt in c/o cellulitis to right arm, redness noted, seen here for same in january

## 2011-09-02 ENCOUNTER — Encounter (HOSPITAL_COMMUNITY): Payer: Self-pay | Admitting: Family Medicine

## 2011-09-02 ENCOUNTER — Emergency Department (HOSPITAL_COMMUNITY): Payer: Medicare Other

## 2011-09-02 DIAGNOSIS — S21009A Unspecified open wound of unspecified breast, initial encounter: Secondary | ICD-10-CM

## 2011-09-02 LAB — DIFFERENTIAL
Eosinophils Absolute: 0 10*3/uL (ref 0.0–0.7)
Lymphocytes Relative: 6 % — ABNORMAL LOW (ref 12–46)
Lymphs Abs: 1.1 10*3/uL (ref 0.7–4.0)
Monocytes Relative: 3 % (ref 3–12)
Neutro Abs: 15.9 10*3/uL — ABNORMAL HIGH (ref 1.7–7.7)
Neutrophils Relative %: 91 % — ABNORMAL HIGH (ref 43–77)

## 2011-09-02 LAB — CBC
Hemoglobin: 12.2 g/dL (ref 12.0–15.0)
MCH: 31 pg (ref 26.0–34.0)
Platelets: 235 10*3/uL (ref 150–400)
RBC: 3.94 MIL/uL (ref 3.87–5.11)
WBC: 17.5 10*3/uL — ABNORMAL HIGH (ref 4.0–10.5)

## 2011-09-02 LAB — POCT I-STAT, CHEM 8
BUN: 13 mg/dL (ref 6–23)
Chloride: 111 mEq/L (ref 96–112)
Glucose, Bld: 134 mg/dL — ABNORMAL HIGH (ref 70–99)
HCT: 36 % (ref 36.0–46.0)
Potassium: 4.2 mEq/L (ref 3.5–5.1)

## 2011-09-02 MED ORDER — SODIUM CHLORIDE 0.9 % IJ SOLN
3.0000 mL | INTRAMUSCULAR | Status: DC | PRN
  Administered 2011-09-07: 3 mL via INTRAVENOUS

## 2011-09-02 MED ORDER — VANCOMYCIN HCL 1000 MG IV SOLR
1500.0000 mg | Freq: Two times a day (BID) | INTRAVENOUS | Status: DC
  Administered 2011-09-02 – 2011-09-03 (×3): 1500 mg via INTRAVENOUS
  Filled 2011-09-02 (×5): qty 1500

## 2011-09-02 MED ORDER — ENOXAPARIN SODIUM 80 MG/0.8ML ~~LOC~~ SOLN: 80.0000 mg | SUBCUTANEOUS | Status: DC

## 2011-09-02 MED ORDER — LISINOPRIL-HYDROCHLOROTHIAZIDE 20-25 MG PO TABS: 1.0000 | ORAL_TABLET | Freq: Every day | ORAL | Status: DC

## 2011-09-02 MED ORDER — PIPERACILLIN SOD-TAZOBACTAM SO 2.25 (2-0.25) G IV SOLR
3.3750 g | Freq: Once | INTRAVENOUS | Status: AC
  Administered 2011-09-02: 3.375 g via INTRAVENOUS
  Filled 2011-09-02: qty 3.38

## 2011-09-02 MED ORDER — ASPIRIN 325 MG PO TABS
325.0000 mg | ORAL_TABLET | Freq: Every day | ORAL | Status: DC
  Administered 2011-09-02: 325 mg via ORAL
  Filled 2011-09-02 (×2): qty 1

## 2011-09-02 MED ORDER — LISINOPRIL 20 MG PO TABS
20.0000 mg | ORAL_TABLET | Freq: Every day | ORAL | Status: DC
  Administered 2011-09-02 – 2011-09-03 (×2): 20 mg via ORAL
  Filled 2011-09-02 (×2): qty 1

## 2011-09-02 MED ORDER — FLUTICASONE PROPIONATE 50 MCG/ACT NA SUSP
1.0000 | Freq: Every day | NASAL | Status: DC
  Administered 2011-09-02 – 2011-09-08 (×7): 1 via NASAL
  Filled 2011-09-02: qty 16

## 2011-09-02 MED ORDER — SODIUM CHLORIDE 0.9 % IV SOLN
Freq: Once | INTRAVENOUS | Status: AC
  Administered 2011-09-02: 50 mL/h via INTRAVENOUS

## 2011-09-02 MED ORDER — ASPIRIN 325 MG PO TABS: 325.0000 mg | ORAL_TABLET | Freq: Every day | ORAL | Status: DC

## 2011-09-02 MED ORDER — VANCOMYCIN HCL IN DEXTROSE 1-5 GM/200ML-% IV SOLN
1000.0000 mg | Freq: Once | INTRAVENOUS | Status: AC
  Administered 2011-09-02: 1000 mg via INTRAVENOUS
  Filled 2011-09-02: qty 200

## 2011-09-02 MED ORDER — HYDROCHLOROTHIAZIDE 25 MG PO TABS
25.0000 mg | ORAL_TABLET | Freq: Every day | ORAL | Status: DC
  Administered 2011-09-02 – 2011-09-03 (×2): 25 mg via ORAL
  Filled 2011-09-02 (×2): qty 1

## 2011-09-02 MED ORDER — ENOXAPARIN SODIUM 80 MG/0.8ML ~~LOC~~ SOLN
80.0000 mg | SUBCUTANEOUS | Status: DC
  Administered 2011-09-02: 80 mg via SUBCUTANEOUS
  Filled 2011-09-02 (×2): qty 0.8

## 2011-09-02 MED ORDER — NEBIVOLOL HCL 10 MG PO TABS
10.0000 mg | ORAL_TABLET | Freq: Every day | ORAL | Status: DC
  Administered 2011-09-02 – 2011-09-08 (×7): 10 mg via ORAL
  Filled 2011-09-02 (×7): qty 1

## 2011-09-02 MED ORDER — MORPHINE SULFATE 2 MG/ML IJ SOLN
2.0000 mg | INTRAMUSCULAR | Status: DC | PRN
  Administered 2011-09-02 – 2011-09-03 (×3): 2 mg via INTRAVENOUS
  Filled 2011-09-02 (×3): qty 1

## 2011-09-02 MED ORDER — CEFAZOLIN SODIUM-DEXTROSE 2-3 GM-% IV SOLR: 2.0000 g | INTRAVENOUS | Status: DC

## 2011-09-02 MED ORDER — NYSTATIN 100000 UNIT/GM EX POWD
Freq: Two times a day (BID) | CUTANEOUS | Status: DC
  Administered 2011-09-02 – 2011-09-03 (×3): via TOPICAL
  Administered 2011-09-04: 15 g via TOPICAL
  Administered 2011-09-04 – 2011-09-07 (×7): via TOPICAL
  Administered 2011-09-08: 15 g via TOPICAL
  Filled 2011-09-02 (×3): qty 15

## 2011-09-02 MED ORDER — PIPERACILLIN-TAZOBACTAM 3.375 G IVPB
3.3750 g | Freq: Three times a day (TID) | INTRAVENOUS | Status: DC
  Administered 2011-09-02 – 2011-09-04 (×7): 3.375 g via INTRAVENOUS
  Filled 2011-09-02 (×7): qty 50

## 2011-09-02 MED ORDER — ACETAMINOPHEN 325 MG PO TABS
650.0000 mg | ORAL_TABLET | Freq: Once | ORAL | Status: AC
  Administered 2011-09-02: 650 mg via ORAL
  Filled 2011-09-02: qty 2

## 2011-09-02 MED ORDER — ACETAMINOPHEN 325 MG PO TABS
650.0000 mg | ORAL_TABLET | Freq: Four times a day (QID) | ORAL | Status: DC | PRN
Start: 2011-09-02 — End: 2011-09-05
  Administered 2011-09-02 – 2011-09-05 (×4): 650 mg via ORAL
  Filled 2011-09-02 (×4): qty 2

## 2011-09-02 NOTE — H&P (Signed)
PCP:   Alva Garnet., MD, MD   Chief Complaint:  Cellulitis right arm  HPI: This is a 61year-old female who is morbidly obese has recurrent cellulitis. Today at approximately 4 PM she developed significant chills. By 8: 30 p.m. she states her right arm was quite swollen, red and and painful. She had fevers and rigors. No nausea no vomiting. She came to the ER. She states she's also had sinus drainage. She has a history of recurring drainage from her breasts due to calcifications, she states is again open and broke out for the last few days and she has 2 open wounds on her breasts.  Review of Systems: Positives bolded   anorexia, fever, weight loss,, vision loss, decreased hearing, hoarseness, chest pain, syncope, dyspnea on exertion, peripheral edema, balance deficits, hemoptysis, abdominal pain, melena, hematochezia, severe indigestion/heartburn, hematuria, incontinence, genital sores, muscle weakness, suspicious skin lesions, transient blindness, difficulty walking, depression, unusual weight change, abnormal bleeding, enlarged lymph nodes, angioedema, and breast masses.  Past Medical History: Past Medical History  Diagnosis Date  . Obesities, morbid   . Hypertension   . Cellulitis     right breast, right arm, ble  . Depression   . Anxiety   . 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 Obstructive sleep apnea     CPAP   Past Surgical History  Procedure Date  . Right lumpectomy 1988  . Abdominal hysterectomy 1991  . Right carpal tunnel release 1995  . Axillary lymph node dissection 1988    Medications: Prior to Admission medications   Medication Sig Start Date End Date Taking? Authorizing Provider  aspirin 325 MG tablet Take 325 mg by mouth daily.   Yes Historical Provider, MD  calcium-vitamin D (OSCAL WITH D) 500-200 MG-UNIT per tablet Take 1 tablet by mouth daily.   Yes  Historical Provider, MD  diclofenac sodium (VOLTAREN) 1 % GEL Apply 1 application topically 4 (four) times daily.   Yes Historical Provider, MD  ibuprofen (ADVIL,MOTRIN) 200 MG tablet Take 600 mg by mouth every 6 (six) hours as needed. Pain   Yes Historical Provider, MD  lisinopril-hydrochlorothiazide (PRINZIDE,ZESTORETIC) 20-25 MG per tablet Take 1 tablet by mouth daily.   Yes Historical Provider, MD  nebivolol (BYSTOLIC) 10 MG tablet Take 10 mg by mouth daily.   Yes Historical Provider, MD    Allergies:   Allergies  Allergen Reactions  . Effexor (Venlafaxine Hydrochloride) Anxiety    Made her want to kill herself    Social History:  reports that she quit smoking about 7 years ago. Her smoking use included Cigarettes. She has never used smokeless tobacco. She reports that she does not drink alcohol or use illicit drugs. uses a CPAP machine, no home oxygen. She uses a walker wheelchair. Patient lives alone. She states she is a fall risk.  Family History: Family History  Problem Relation Age of Onset  . Hypertension      Physical Exam: Filed Vitals:   09/01/11 2206 09/02/11 0419 09/02/11 0438  BP: 124/64  136/61  Pulse: 123  98  Temp: 99.6 F (37.6 C)  102 F (38.9 C)  TempSrc: Oral  Oral  Resp: 20  20  Height:  5\' 3"  (1.6 m)   Weight:  182.8 kg (403 lb)   SpO2: 100%  97%    General:  Alert and oriented times  three, morbidly obese female, no acute distress Eyes: PERRLA, pink conjunctiva, no scleral icterus ENT: Moist oral mucosa, neck supple, no thyromegaly Lungs: clear to ascultation, no wheeze, no crackles, no use of accessory muscles Cardiovascular: regular rate and rhythm, no regurgitation, no gallops, no murmurs. No carotid bruits, no JVD Abdomen: soft, positive BS, non-tender, non-distended, no organomegaly, not an acute abdomen GU: not examined Neuro: CN II - XII grossly intact, sensation intact Musculoskeletal: strength 5/5 all extremities, no clubbing, cyanosis  or edema, right armth significant lymphangitis and cellulitis, Skin: no rash, no subcutaneous crepitation, no decubitus, right breast with open wounds at the the 7:00 and 11:00 positions, no significant fashion the pannus or breast Psych: appropriate patient   Labs on Admission:   [facility] 09/02/11 0245  NA 142  K 4.2  CL 111  CO2 --  GLUCOSE 134*  BUN 13  CREATININE 0.90  CALCIUM --  MG --  PHOS --   No results found for this basename: AST:2,ALT:2,ALKPHOS:2,BILITOT:2,PROT:2,ALBUMIN:2 in the last 72 hours No results found for this basename: LIPASE:2,AMYLASE:2 in the last 72 hours  Basename 09/02/11 0245 09/02/11 0236  WBC -- 17.5*  NEUTROABS -- 15.9*  HGB 12.2 12.2  HCT 36.0 36.6  MCV -- 92.9  PLT -- 235   No results found for this basename: CKTOTAL:3,CKMB:3,CKMBINDEX:3,TROPONINI:3 in the last 72 hours No components found with this basename: POCBNP:3 No results found for this basename: DDIMER:2 in the last 72 hours No results found for this basename: HGBA1C:2 in the last 72 hours No results found for this basename: CHOL:2,HDL:2,LDLCALC:2,TRIG:2,CHOLHDL:2,LDLDIRECT:2 in the last 72 hours No results found for this basename: TSH,T4TOTAL,FREET3,T3FREE,THYROIDAB in the last 72 hours No results found for this basename: VITAMINB12:2,FOLATE:2,FERRITIN:2,TIBC:2,IRON:2,RETICCTPCT:2 in the last 72 hours  Micro Results: No results found for this or any previous visit (from the past 240 hour(s)).   Radiological Exams on Admission: Dg Humerus Right  09/02/2011  *RADIOLOGY REPORT*  Clinical Data: Right arm cellulitis  RIGHT HUMERUS - 2+ VIEW  Comparison: 09/03/2010 clavicle and shoulderradiographs  Findings:  Degenerative changes of the shoulder are partially imaged. No acute fracture or dislocation.  No aggressive osseous lesion.  There is a oval-shaped calcific density within the soft tissues of the arm posteriorly, nonspecific however of nonaggressive appearance.  IMPRESSION: No acute  osseous abnormality identified.  Original Report Authenticated By: Waneta Martins, M.D.    Assessment/Plan Present on Admission:  .Cellulitis of arm, right .Acute lymphangitis of right upper extremity Admit to MedSurg  IV antibiotics and pain medications ordered  .HTN (hypertension) Morbid obesity BMI greater than 60 Stable resume home medications  .Open wound of right breast, eroding calcifications Patient on IV antibiotics [facility]care consulted Obstructive sleep apnea  CPAP machine ordered  Full code DVT prophylaxis Team 5/Dr. Art Buff, Mirna Sutcliffe 09/02/2011, 5:08 AM

## 2011-09-02 NOTE — Consult Note (Signed)
Reason for Consult:Cellulitis Right arm and drainage from Right Breast. Referring Physician: [name] is an 61 y.o. female.  HPI:   The patient is a 61 year old female with a history of right breast lumpectomy possible axillary lymph node dissection and radiation therapy in 1988. She has ongoing problems with skin calcifications, recurrent open wound with drainage from the skin calcifications in her breast, and recurrent cellulitis her right upper extremity. From the records available it looks like it goes back to at least 2008. She was last seen 07/04/2011 by Dr. Viviann Spare gross for this problem. She was treated with antibiotics and did well till approximately 3 weeks ago. At that time she had a site open on her right breast at the 10:00 position which started draining again. Partially 1 week ago she had a second site open at approximately the 6:30 position. Both of these have continued to drain. Yesterday she developed cellulitis in the afternoon to her right upper extremity and she was subsequently admitted by the hospitalist for treatment. During her January evaluation Dr. gross recommended continued antibiotics. Followup at the breast center for a lymphedema evaluation. He also recommended she be seen at the breast center for ultrasound guided biopsy to rule out malignancy. He noted any surgical intervention I have previously treated breast with radiation has difficulty with healing, and could lead to further infection and wound issues. We're asked to the patient in consultation again at this time. Patient reports she was unable to be followed up in the breast center since her last evaluation.  Past Medical History  Diagnosis Date  . Obesities, morbid  BMI 71.5   . Hypertension   . Cellulitis     right breast, right arm, ble  . Depression   . Anxiety   . 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 Calcification of right breast     chronic s/p  WLE/XRT  Mammograms reported negative 12/2010.  Marland Kitchen Obstructive sleep apnea     CPAP she uses her CPAP nightly  . Breast cancer History of tobacco use; 40 years 1PPD  She quit in her early 24's. Cataract Left eye 1988    Past Surgical History  Procedure Date  . Right lumpectomy 1988  . Abdominal hysterectomy 1991  . Right carpal tunnel release 1995  . Axillary lymph node dissection 1988    Family History  Problem Relation Age of Onset  . Hypertension      Social History:  reports that she quit smoking about 7 years ago. Her smoking use included Cigarettes. She has never used smokeless tobacco. She reports that she does not drink alcohol or use illicit drugs.  Allergies:  Allergies  Allergen Reactions  . Effexor (Venlafaxine Hydrochloride) Anxiety    Made her want to kill herself    Medications:  Prior to Admission:  Prescriptions prior to admission  Medication Sig Dispense Refill  . aspirin 325 MG tablet Take 325 mg by mouth daily.      . calcium-vitamin D (OSCAL WITH D) 500-200 MG-UNIT per tablet Take 1 tablet by mouth daily.      . diclofenac sodium (VOLTAREN) 1 % GEL Apply 1 application topically 4 (four) times daily.      Marland Kitchen ibuprofen (ADVIL,MOTRIN) 200 MG tablet Take 600 mg by mouth every 6 (six) hours as needed. Pain      . lisinopril-hydrochlorothiazide (PRINZIDE,ZESTORETIC) 20-25 MG per tablet Take 1 tablet by mouth daily.      Marland Kitchen  nebivolol (BYSTOLIC) 10 MG tablet Take 10 mg by mouth daily.       Scheduled:   . sodium chloride   Intravenous Once  . acetaminophen  650 mg Oral Once  . aspirin  325 mg Oral Daily  . enoxaparin  80 mg Subcutaneous Q24H  . fluticasone  1 spray Each Nare Daily  . hydrochlorothiazide  25 mg Oral Daily  . lisinopril  20 mg Oral Daily  . nebivolol  10 mg Oral Daily  . nystatin   Topical BID  . piperacillin-tazobactam (ZOSYN)  IV  3.375 g Intravenous Once  . piperacillin-tazobactam (ZOSYN)  IV  3.375 g Intravenous Q8H  . vancomycin   1,500 mg Intravenous Q12H  . vancomycin  1,000 mg Intravenous Once  . DISCONTD: lisinopril-hydrochlorothiazide  1 tablet Oral Daily   Continuous:  ZOX:[id number], sodium chloride Anti-infectives     Start     Dose/Rate Route Frequency Ordered Stop   09/02/11 1400   piperacillin-tazobactam (ZOSYN) IVPB 3.375 g        3.375 g 12.5 mL/hr over 240 Minutes Intravenous 3 times per day 09/02/11 0700     09/02/11 0800   vancomycin (VANCOCIN) 1,500 mg in sodium chloride 0.9 % 500 mL IVPB        1,500 mg 250 mL/hr over 120 Minutes Intravenous Every 12 hours 09/02/11 0700     09/02/11 0415   vancomycin (VANCOCIN) IVPB 1000 mg/200 mL premix        1,000 mg 200 mL/hr over 60 Minutes Intravenous  Once 09/02/11 0404 09/02/11 0602   09/02/11 0415   piperacillin-tazobactam (ZOSYN) 3.375 g in dextrose 5 % 50 mL IVPB        3.375 g 100 mL/hr over 30 Minutes Intravenous  Once 09/02/11 0404 09/02/11 0458          Results for orders placed during the hospital encounter of 09/01/11 (from the past 48 hour(s))  CBC     Status: Abnormal   Collection Time   09/02/11  2:36 AM      Component Value Range Comment   WBC 17.5 (*) 4.0 - 10.5 (K/uL)    RBC 3.94  3.87 - 5.11 (MIL/uL)    Hemoglobin 12.2  12.0 - 15.0 (g/dL)    HCT 98.1  19.1 - 47.8 (%)    MCV 92.9  78.0 - 100.0 (fL)    MCH 31.0  26.0 - 34.0 (pg)    MCHC 33.3  30.0 - 36.0 (g/dL)    RDW 29.5  62.1 - 30.8 (%)    Platelets 235  150 - 400 (K/uL)   DIFFERENTIAL     Status: Abnormal   Collection Time   09/02/11  2:36 AM      Component Value Range Comment   Neutrophils Relative 91 (*) 43 - 77 (%)    Neutro Abs 15.9 (*) 1.7 - 7.7 (K/uL)    Lymphocytes Relative 6 (*) 12 - 46 (%)    Lymphs Abs 1.1  0.7 - 4.0 (K/uL)    Monocytes Relative 3  3 - 12 (%)    Monocytes Absolute 0.5  0.1 - 1.0 (K/uL)    Eosinophils Relative 0  0 - 5 (%)    Eosinophils Absolute 0.0  0.0 - 0.7 (K/uL)    Basophils Relative 0  0 - 1 (%)    Basophils Absolute 0.0  0.0 - 0.1  (K/uL)   POCT I-STAT, CHEM 8     Status: Abnormal   Collection  Time   09/02/11  2:45 AM      Component Value Range Comment   Sodium 142  135 - 145 (mEq/L)    Potassium 4.2  3.5 - 5.1 (mEq/L)    Chloride 111  96 - 112 (mEq/L)    BUN 13  6 - 23 (mg/dL)    Creatinine, Ser 1.61  0.50 - 1.10 (mg/dL)    Glucose, Bld 096 (*) 70 - 99 (mg/dL)    Calcium, Ion 0.45  1.12 - 1.32 (mmol/L)    TCO2 21  0 - 100 (mmol/L)    Hemoglobin 12.2  12.0 - 15.0 (g/dL)    HCT 40.9  81.1 - 91.4 (%)     Dg Humerus Right  09/02/2011  *RADIOLOGY REPORT*  Clinical Data: Right arm cellulitis  RIGHT HUMERUS - 2+ VIEW  Comparison: 09/03/2010 clavicle and shoulderradiographs  Findings:  Degenerative changes of the shoulder are partially imaged. No acute fracture or dislocation.  No aggressive osseous lesion.  There is a oval-shaped calcific density within the soft tissues of the arm posteriorly, nonspecific however of nonaggressive appearance.  IMPRESSION: No acute osseous abnormality identified.  Original Report Authenticated By: Waneta Martins, M.D.    Review of Systems  Constitutional: Positive for fever (on and off per patient), chills and malaise/fatigue. Negative for weight loss.  HENT: Negative.   Eyes:       Cataract Left eye  Respiratory: Negative.        Some sinus problems, now and about 2 times per year.  Cardiovascular: Negative.   Gastrointestinal: Positive for diarrhea (chronic diarrhea). Negative for heartburn, nausea, vomiting, abdominal pain, constipation, blood in stool and melena.  Genitourinary: Negative.   Musculoskeletal: Positive for joint pain.       Chronic knee pain can only ambulate with walker very short distance.  Skin: Positive for rash.       cellulitis started yesterday afternoon  Neurological: Negative.   Endo/Heme/Allergies: Negative.    Blood pressure 149/66, pulse 99, temperature 98.8 F (37.1 C), temperature source Oral, resp. rate 20, height 5\' 3"  (1.6 m), weight 182.8 kg  (403 lb), SpO2 98.00%. Physical Exam  Constitutional: She is oriented to person, place, and time. No distress.       Morbidly obese WF NAD  HENT:  Head: Normocephalic and atraumatic.  Nose: Nose normal.  Mouth/Throat: No oropharyngeal exudate.  Eyes: Conjunctivae and EOM are normal. Pupils are equal, round, and reactive to light. Right eye exhibits no discharge. Left eye exhibits no discharge. No scleral icterus.  Neck: Normal range of motion. Neck supple. No JVD present. No tracheal deviation present. No thyromegaly present.  Cardiovascular: Normal rate, normal heart sounds and intact distal pulses.  Exam reveals no gallop.   No murmur heard.      HR upper 90's  Respiratory: Effort normal and breath sounds normal. No respiratory distress. She has no wheezes. She has no rales. She exhibits no tenderness.       Right Breast:  Smaller than Left.  Open area at 10 o'clock position near chest wall.  She has an open area with calcification coming thru the skin. The calcicication is about 0.5 cm in size  It is irregular in size and shape.  The open area is also irregular, about 1-1.5 cm in size No erythema or drainage currently.  2nd site about 6:30 position mid way between chest wall and nipple with smaller calcific area that is open just a  few mm in size.  It is not currently draining, and is not erythematous.  Left Breast is large, but no open area, no drainage.  GI: Soft. Bowel sounds are normal. She exhibits no distension. There is no tenderness. There is no rebound and no guarding.  Musculoskeletal: She exhibits edema. She exhibits no tenderness.  Lymphadenopathy:    She has no cervical adenopathy.  Neurological: She is alert and oriented to person, place, and time. No cranial nerve deficit.  Skin: She is not diaphoretic.       Right arm is about 1.5 times the size of the Left.  It has circumferential erythema, from the shoulder to the hand.  She has good mobility of the joints and can hold  her arm up for exam, and bend her elbow.  The arm is tender,but there is no drainage from it, and I cannot palpate any areas of fluctuance.  I did not palpate any lymphadenopathy in the axillary or supraclavicular area.  Psychiatric: She has a normal mood and affect. Her behavior is normal. Judgment and thought content normal.    Assessment/Plan: 1. Recurrent cellulitis right upper extremity. 2. History right breast lumpectomy, axillary node dissection and radiation therapy 1988. 3. Recurrent right breast skin calcification with open draining wounds going back to at least 2008. 4. Morbid obesity BMI 71.5 5. Hypertension 6. Limited mobility secondary to bilateral knee problems. She mobilizes with a walker. 7. History of anxiety and depression. 8. Obstructive sleep apnea with CPAP; patient reports nightly compliance.  Plan: Currently it appears that antibiotics would be the primary treatment. I will discuss her review with Dr. Abbey Chatters. Once she has resolved her cellulitis, she should still be directed to followup with the [facility] and Dr. Michaell Cowing.   [name] 09/02/2011, 11:02 AM

## 2011-09-02 NOTE — ED Provider Notes (Signed)
History     CSN: [id number]  Arrival date & time 09/01/11  2155   None     Chief Complaint  Patient presents with  . Cellulitis    (Consider location/radiation/quality/duration/timing/severity/associated sxs/prior treatment) HPI Comments: Patient has history of cellulitis of the right arm.  She states that several hours ago.  She started with Rigors, pain in her right arm and it became red.  She's had streaking up to the right chest wall.  She has a history of lymphedema of the right arm.  She was recently admitted to the hospital for the same thing on IV antibiotics.  She was at her normal baseline and she was discharged home  The history is provided by the patient.    Past Medical History  Diagnosis Date  . Obesities, morbid   . Hypertension   . Cellulitis     right breast, right arm, ble  . Depression   . Anxiety   . 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 Calcification of right breast     chronic s/p WLE/XRT     Past Surgical History  Procedure Date  . Right lumpectomy 1988  . Abdominal hysterectomy 1991  . Right carpal tunnel release 1995  . Axillary lymph node dissection 1988    History reviewed. No pertinent family history.  History  Substance Use Topics  . Smoking status: Former Smoker    Types: Cigarettes    Quit date: 07/01/2004  . Smokeless tobacco: Never Used  . Alcohol Use: No    OB History    Grav Para Term Preterm Abortions TAB SAB Ect Mult Living                  Review of Systems  Constitutional: Positive for fever and chills.  Musculoskeletal: Positive for joint swelling.  Skin: Positive for pallor. Negative for wound.  Neurological: Negative for weakness.    Allergies  Effexor  Home Medications   Current Outpatient Rx  Name Route Sig Dispense Refill  . ASPIRIN 325 MG PO TABS Oral Take 325 mg by mouth daily.    Marland Kitchen CALCIUM CARBONATE-VITAMIN D 500-200 MG-UNIT PO TABS Oral Take 1 tablet by  mouth daily.    Marland Kitchen DICLOFENAC SODIUM 1 % TD GEL Topical Apply 1 application topically 4 (four) times daily.    . IBUPROFEN 200 MG PO TABS Oral Take 600 mg by mouth every 6 (six) hours as needed. Pain    . LISINOPRIL-HYDROCHLOROTHIAZIDE 20-25 MG PO TABS Oral Take 1 tablet by mouth daily.    . NEBIVOLOL HCL 10 MG PO TABS Oral Take 10 mg by mouth daily.      BP 124/64  Pulse 123  Temp(Src) 99.6 F (37.6 C) (Oral)  Resp 20  Ht 5\' 3"  (1.6 m)  Wt 403 lb (182.8 kg)  BMI 71.39 kg/m2  SpO2 100%  Physical Exam  Constitutional: She is oriented to person, place, and time. She appears well-developed and well-nourished.  HENT:  Head: Normocephalic.  Eyes: Pupils are equal, round, and reactive to light.  Neck: Normal range of motion.  Cardiovascular: Tachycardia present.   Pulmonary/Chest: Effort normal and breath sounds normal.  Musculoskeletal: She exhibits edema and tenderness.  Neurological: She is alert and oriented to person, place, and time.  Skin:       At the forearm and upper arm circumferentially leg swollen, tender or with streaking or to the anterior shoulder and upper  right chest wall    ED Course  Procedures (including critical care time)  Labs Reviewed  CBC - Abnormal; Notable for the following:    WBC 17.5 (*)    All other components within normal limits  DIFFERENTIAL - Abnormal; Notable for the following:    Neutrophils Relative 91 (*)    Neutro Abs 15.9 (*)    Lymphocytes Relative 6 (*)    All other components within normal limits  POCT I-STAT, CHEM 8 - Abnormal; Notable for the following:    Glucose, Bld 134 (*)    All other components within normal limits  CULTURE, BLOOD (ROUTINE X 2)  CULTURE, BLOOD (ROUTINE X 2)   Dg Humerus Right  09/02/2011  *RADIOLOGY REPORT*  Clinical Data: Right arm cellulitis  RIGHT HUMERUS - 2+ VIEW  Comparison: 09/03/2010 clavicle and shoulderradiographs  Findings:  Degenerative changes of the shoulder are partially imaged. No acute  fracture or dislocation.  No aggressive osseous lesion.  There is a oval-shaped calcific density within the soft tissues of the arm posteriorly, nonspecific however of nonaggressive appearance.  IMPRESSION: No acute osseous abnormality identified.  Original Report Authenticated By: Waneta Martins, M.D.     1. Cellulitis of arm, right       MDM  Cellulitis was received CBC i-STAT blood cultures x-ray of the wrist to rule out ostio versus free air/gas        Arman Filter, NP 09/02/11 [phone]

## 2011-09-02 NOTE — Progress Notes (Signed)
ANTIBIOTIC CONSULT NOTE - INITIAL  Pharmacy Consult for vancomycin/zosyn/lovenox Indication: Cellulitis/DVT prophylaxis  Allergies  Allergen Reactions  . Effexor (Venlafaxine Hydrochloride) Anxiety    Made her want to kill herself    Patient Measurements: Height: 5\' 3"  (160 cm) Weight: 403 lb (182.8 kg) IBW/kg (Calculated) : 52.4  Adjusted Body Weight:   Vital Signs: Temp: 100.6 F (38.1 C) (03/13 0541) Temp src: Oral (03/13 0541) BP: 136/61 mmHg (03/13 0438) Pulse Rate: 98  (03/13 0438) Intake/Output from previous day:   Intake/Output from this shift:    Labs:  Basename 09/02/11 0245 09/02/11 0236  WBC -- 17.5*  HGB 12.2 12.2  PLT -- 235  LABCREA -- --  CREATININE 0.90 --   Estimated Creatinine Clearance: 105.6 ml/min (by C-G formula based on Cr of 0.9). No results found for this basename: VANCOTROUGH:2,VANCOPEAK:2,VANCORANDOM:2,GENTTROUGH:2,GENTPEAK:2,GENTRANDOM:2,TOBRATROUGH:2,TOBRAPEAK:2,TOBRARND:2,AMIKACINPEAK:2,AMIKACINTROU:2,AMIKACIN:2, in the last 72 hours   Microbiology: No results found for this or any previous visit (from the past 720 hour(s)).  Medical History: Past Medical History  Diagnosis Date  . Obesities, morbid   . Hypertension   . Cellulitis     right breast, right arm, ble  . Depression   . Anxiety   . 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 Obstructive sleep apnea     CPAP    Medications:  Anti-infectives     Start     Dose/Rate Route Frequency Ordered Stop   09/02/11 1400   piperacillin-tazobactam (ZOSYN) IVPB 3.375 g        3.375 g 12.5 mL/hr over 240 Minutes Intravenous 3 times per day 09/02/11 0700     09/02/11 0800   vancomycin (VANCOCIN) 1,500 mg in sodium chloride 0.9 % 500 mL IVPB        1,500 mg 250 mL/hr over 120 Minutes Intravenous Every 12 hours 09/02/11 0700     09/02/11 0415   vancomycin (VANCOCIN) IVPB 1000 mg/200  mL premix        1,000 mg 200 mL/hr over 60 Minutes Intravenous  Once 09/02/11 0404 09/02/11 0602   09/02/11 0415   piperacillin-tazobactam (ZOSYN) 3.375 g in dextrose 5 % 50 mL IVPB        3.375 g 100 mL/hr over 30 Minutes Intravenous  Once 09/02/11 0404 09/02/11 0458         Assessment: Patient with cellulitis.  Pt with obesity.  First dose of antibiotics already given in ED.  Goal of Therapy:  Vancomycin trough level 10-15 mcg/ml Zosyn 3.375g IV Q8H infused over 4hrs.  Enoxaparin dosed based on patient weight and renal function   Plan:  Measure antibiotic drug levels at steady state Follow up culture results Vancomycin 1500mg  iv q12hr (1st dose to make 2500mg  loading dose) Zosyn 3.375g IV Q8H infused over 4hrs.  Change lovenox to 80mg  sq q24hr  Aleene Davidson Crowford 09/02/2011,7:04 AM

## 2011-09-02 NOTE — Plan of Care (Signed)
Problem: Food- and Nutrition-Related Knowledge Deficit (NB-1.1) Goal: Nutrition education Formal process to instruct or train a patient/client in a skill or to impart knowledge to help patients/clients voluntarily manage or modify food choices and eating behavior to maintain or improve health.  Outcome: Completed/Met Date Met:  09/02/11 Met with pt to discuss healthy eating as pt with BMI >60, morbidly obese. Pt reports eating a varied diet of fruits, vegetables, soups, and only eats meats twice/week. Pt's primary beverage of choice is water. Pt reports eating 1-2 meals/day at home. Pt denies any recent changes in weight. Pt states she would like to exercise but cannot because of her current medical condition. Encourage small frequent meals to increase metabolism and to continue her balanced diet. Pt without any further educational needs.

## 2011-09-02 NOTE — ED Provider Notes (Signed)
Medical screening examination/treatment/procedure(s) were conducted as a shared visit with non-physician practitioner(s) and myself.  I personally evaluated the patient during the encounter   PERRL MMM RRR CTAB NABS, soft NT Cellulitis of the RUE, warm erythematous  FROM  Plan admit  Zaidan Keeble K Yony Roulston-Rasch, MD 09/02/11 [phone]

## 2011-09-02 NOTE — Progress Notes (Signed)
Patient noted with Increased reddened and moist area to lower abdominal folds, groin area, and under L breast. MD ordered Nystatin powder to be given. Applied to affected areas using a large amount of powder. Patient reported that she uses a similar power at home. Will monitor areas for for increased healing to sites. Cordney Barstow [facility], SN/Arlinda Mayford Knife, RN.

## 2011-09-02 NOTE — Consult Note (Signed)
Patient seen and examined.  Chart reviewed.  She has heterotopic calcifications of the right breast.  I removed the calcification from the lower breast open wound and sent it to pathology.  The chronic open wound in the right upper breast will not heal as the calcifications are extruding from it.  I suggested debride/removal of as much as this as possible under anesthesia in the operating room to give the wound a chance to heal.  She is in agreement with this.  The procedure and risks were discussed with her.  Risks include but are not limited to bleeding, infection,failure of wound to heal, recurrence, anesthesia.  As for the RUE cellulitis, wound continue the IV abxs.

## 2011-09-02 NOTE — Progress Notes (Signed)
Subjective: Feelling well. Relates open wound on and off of right breast for last year. She is having some drainage from open wound. She has prior history of Right side breast Ca in 1980.. Objective: Filed Vitals:   09/01/11 2206 09/02/11 0419 09/02/11 0438 09/02/11 0541  BP: 124/64  136/61   Pulse: 123  98   Temp: 99.6 F (37.6 C)  102 F (38.9 C) 100.6 F (38.1 C)  TempSrc: Oral  Oral Oral  Resp: 20  20   Height:  5\' 3"  (1.6 m)    Weight:  403 lb (182.8 kg)    SpO2: 100%  97%    Weight change:  No intake or output data in the 24 hours ending 09/02/11 0934  General: Alert, awake, oriented x3, in no acute distress.  HEENT: No bruits, no goiter.  Heart: Regular rate and rhythm, without murmurs, rubs, gallops.  Lungs: CTA,, bilateral air movement.  Abdomen: Soft, nontender, nondistended, positive bowel sounds.  Neuro: Grossly intact, nonfocal. Extremities: Right arm with swelling and erythema.  Chest: open wound right breast upper outer quadrant 1 Cm round, Another smallest wound lower side of right breast. breast is had.    Lab Results:  [facility] 09/02/11 0245  NA 142  K 4.2  CL 111  CO2 --  GLUCOSE 134*  BUN 13  CREATININE 0.90  CALCIUM --  MG --  PHOS --    Basename 09/02/11 0245 09/02/11 0236  WBC -- 17.5*  NEUTROABS -- 15.9*  HGB 12.2 12.2  HCT 36.0 36.6  MCV -- 92.9  PLT -- 235    Micro Results: No results found for this or any previous visit (from the past 240 hour(s)).  Studies/Results: Dg Humerus Right  09/02/2011  *RADIOLOGY REPORT*  Clinical Data: Right arm cellulitis  RIGHT HUMERUS - 2+ VIEW  Comparison: 09/03/2010 clavicle and shoulderradiographs  Findings:  Degenerative changes of the shoulder are partially imaged. No acute fracture or dislocation.  No aggressive osseous lesion.  There is a oval-shaped calcific density within the soft tissues of the arm posteriorly, nonspecific however of nonaggressive appearance.  IMPRESSION: No acute osseous  abnormality identified.  Original Report Authenticated By: Waneta Martins, M.D.    Medications: I have reviewed the patient's current medications.   Patient Active Hospital Problem List:  Cellulitis of arm, right (07/01/2011) Continue with Vancomycin and Zosyn day 1. Open wound from breast probably source of infection.   BMI 70 and over, adult (07/01/2011)   HTN (hypertension) (07/01/2011) Continue with Lisinopril, HCTZ, bystolic.   Open wound of right breast, eroding calcifications  General surgery consulted. Will defer to surgery Ct scan.       LOS: 1 day   Raetta Agostinelli M.D.  [facility] 09/02/2011, 9:34 AM

## 2011-09-02 NOTE — Progress Notes (Signed)
[facility] CHART REVIEWED; B Alis Sawchuk RN, BSN, MHA 

## 2011-09-02 NOTE — Consult Note (Signed)
WOC consult Note Reason for Consult: Patient not seen; simultaneous consultation to CCS placed today.  Surgeon in, plans underway for procedure to remove calcifications Right Breast. I will not follow.  Please re-consult if needed. Thanks, Ladona Mow, MSN, RN, [facility], CWOCN [phone])

## 2011-09-02 NOTE — Progress Notes (Signed)
Pt arrived to room 1308 via wheelchair from ED at 0650.  Pt is alert, oriented x3, VSS charted on Epic.  Oriented pt to room and unit.  Pt has cellulitis to right arm that is red, warm, and hard.  Pt has two areas to right breast that are covered with a band-aid.  Pt is bariatric bed.  Safety video was seen.  Call light is within reach. Will continue to monitor.

## 2011-09-03 ENCOUNTER — Inpatient Hospital Stay (HOSPITAL_COMMUNITY): Payer: Medicare Other | Admitting: Anesthesiology

## 2011-09-03 ENCOUNTER — Encounter (HOSPITAL_COMMUNITY): Payer: Self-pay | Admitting: Anesthesiology

## 2011-09-03 ENCOUNTER — Encounter (HOSPITAL_COMMUNITY)
Admission: EM | Disposition: A | Discharge status: Home / Self Care | Payer: Self-pay | Source: Home / Self Care | Attending: Internal Medicine

## 2011-09-03 DIAGNOSIS — R920 Mammographic microcalcification found on diagnostic imaging of breast: Secondary | ICD-10-CM

## 2011-09-03 HISTORY — PX: WOUND DEBRIDEMENT: SHX247

## 2011-09-03 LAB — BASIC METABOLIC PANEL
CO2: 21 mEq/L (ref 19–32)
Chloride: 106 mEq/L (ref 96–112)
Glucose, Bld: 91 mg/dL (ref 70–99)
Potassium: 3.5 mEq/L (ref 3.5–5.1)
Sodium: 139 mEq/L (ref 135–145)

## 2011-09-03 LAB — CBC
HCT: 33.3 % — ABNORMAL LOW (ref 36.0–46.0)
Hemoglobin: 11.2 g/dL — ABNORMAL LOW (ref 12.0–15.0)
MCH: 31.3 pg (ref 26.0–34.0)
MCV: 93 fL (ref 78.0–100.0)
Platelets: 176 10*3/uL (ref 150–400)
RBC: 3.58 MIL/uL — ABNORMAL LOW (ref 3.87–5.11)
WBC: 15.5 10*3/uL — ABNORMAL HIGH (ref 4.0–10.5)

## 2011-09-03 LAB — VANCOMYCIN, TROUGH: Vancomycin Tr: 28.1 ug/mL (ref 10.0–20.0)

## 2011-09-03 SURGERY — DEBRIDEMENT, WOUND
Anesthesia: Monitor Anesthesia Care | Site: Breast | Laterality: Right | Wound class: Contaminated

## 2011-09-03 MED ORDER — CEFAZOLIN SODIUM 1-5 GM-% IV SOLN
INTRAVENOUS | Status: AC
  Filled 2011-09-03: qty 100

## 2011-09-03 MED ORDER — FENTANYL CITRATE 0.05 MG/ML IJ SOLN
INTRAMUSCULAR | Status: DC | PRN
  Administered 2011-09-03: 50 ug via INTRAVENOUS
  Administered 2011-09-03: 12.5 ug via INTRAVENOUS

## 2011-09-03 MED ORDER — LIDOCAINE HCL (CARDIAC) 20 MG/ML IV SOLN
INTRAVENOUS | Status: DC | PRN
  Administered 2011-09-03: 50 mg via INTRAVENOUS

## 2011-09-03 MED ORDER — PROMETHAZINE HCL 25 MG/ML IJ SOLN: 6.2500 mg | INTRAMUSCULAR | Status: DC | PRN

## 2011-09-03 MED ORDER — PROPOFOL 10 MG/ML IV EMUL
INTRAVENOUS | Status: DC | PRN
  Administered 2011-09-03: 100 ug/kg/min via INTRAVENOUS

## 2011-09-03 MED ORDER — FENTANYL CITRATE 0.05 MG/ML IJ SOLN: 25.0000 ug | INTRAMUSCULAR | Status: DC | PRN

## 2011-09-03 MED ORDER — BUPIVACAINE HCL 0.5 % IJ SOLN
INTRAMUSCULAR | Status: DC | PRN
  Administered 2011-09-03: 8 mL

## 2011-09-03 MED ORDER — KETAMINE HCL 50 MG/ML IJ SOLN
INTRAMUSCULAR | Status: DC | PRN
  Administered 2011-09-03 (×4): 25 mg via INTRAMUSCULAR

## 2011-09-03 MED ORDER — SODIUM CHLORIDE 0.9 % IV SOLN
INTRAVENOUS | Status: DC
  Administered 2011-09-03: 12:00:00 via INTRAVENOUS
  Administered 2011-09-05: 75 mL/h via INTRAVENOUS

## 2011-09-03 MED ORDER — MIDAZOLAM HCL 5 MG/5ML IJ SOLN
INTRAMUSCULAR | Status: DC | PRN
  Administered 2011-09-03 (×2): 1 mg via INTRAVENOUS

## 2011-09-03 MED ORDER — 0.9 % SODIUM CHLORIDE (POUR BTL) OPTIME
TOPICAL | Status: DC | PRN
  Administered 2011-09-03: 1000 mL

## 2011-09-03 MED ORDER — BUPIVACAINE HCL (PF) 0.5 % IJ SOLN
INTRAMUSCULAR | Status: AC
  Filled 2011-09-03: qty 30

## 2011-09-03 MED ORDER — LACTATED RINGERS IV SOLN
INTRAVENOUS | Status: DC
  Administered 2011-09-03: 1000 mL via INTRAVENOUS

## 2011-09-03 MED ORDER — MORPHINE SULFATE 2 MG/ML IJ SOLN
2.0000 mg | INTRAMUSCULAR | Status: DC | PRN
  Administered 2011-09-03 – 2011-09-05 (×4): 2 mg via INTRAVENOUS
  Filled 2011-09-03 (×3): qty 1

## 2011-09-03 MED ORDER — BIOTENE DRY MOUTH MT LIQD
15.0000 mL | Freq: Two times a day (BID) | OROMUCOSAL | Status: DC
  Administered 2011-09-05 – 2011-09-07 (×4): 15 mL via OROMUCOSAL

## 2011-09-03 MED ORDER — MORPHINE SULFATE 2 MG/ML IJ SOLN
INTRAMUSCULAR | Status: AC
  Filled 2011-09-03: qty 1

## 2011-09-03 MED ORDER — LACTATED RINGERS IV SOLN
INTRAVENOUS | Status: DC | PRN
  Administered 2011-09-03: 15:00:00 via INTRAVENOUS

## 2011-09-03 MED ORDER — CHLORHEXIDINE GLUCONATE 0.12 % MT SOLN
15.0000 mL | Freq: Two times a day (BID) | OROMUCOSAL | Status: DC
  Administered 2011-09-04 – 2011-09-08 (×9): 15 mL via OROMUCOSAL
  Filled 2011-09-03 (×11): qty 15

## 2011-09-03 MED ORDER — ENOXAPARIN SODIUM 40 MG/0.4ML ~~LOC~~ SOLN
40.0000 mg | SUBCUTANEOUS | Status: DC
  Administered 2011-09-04 – 2011-09-06 (×3): 40 mg via SUBCUTANEOUS
  Filled 2011-09-03 (×3): qty 0.4

## 2011-09-03 SURGICAL SUPPLY — 33 items
BANDAGE GAUZE ELAST BULKY 4 IN (GAUZE/BANDAGES/DRESSINGS) IMPLANT
CANISTER SUCTION 2500CC (MISCELLANEOUS) ×3 IMPLANT
CLOTH BEACON ORANGE TIMEOUT ST (SAFETY) ×3 IMPLANT
COVER SURGICAL LIGHT HANDLE (MISCELLANEOUS) ×3 IMPLANT
DRAPE EXTREMITY T 121X128X90 (DRAPE) IMPLANT
DRAPE LG THREE QUARTER DISP (DRAPES) ×3 IMPLANT
DRAPE PED LAPAROTOMY (DRAPES) ×2 IMPLANT
DRSG PAD ABDOMINAL 8X10 ST (GAUZE/BANDAGES/DRESSINGS) ×2 IMPLANT
ELECT REM PT RETURN 9FT ADLT (ELECTROSURGICAL) ×3
ELECTRODE REM PT RTRN 9FT ADLT (ELECTROSURGICAL) ×2 IMPLANT
GAUZE SPONGE 2X2 8PLY STRL LF (GAUZE/BANDAGES/DRESSINGS) ×1 IMPLANT
GLOVE BIOGEL PI IND STRL 7.0 (GLOVE) ×2 IMPLANT
GLOVE BIOGEL PI INDICATOR 7.0 (GLOVE) ×1
GLOVE ECLIPSE 8.0 STRL XLNG CF (GLOVE) ×3 IMPLANT
GLOVE INDICATOR 8.0 STRL GRN (GLOVE) ×6 IMPLANT
GOWN STRL NON-REIN LRG LVL3 (GOWN DISPOSABLE) ×3 IMPLANT
GOWN STRL REIN XL XLG (GOWN DISPOSABLE) ×6 IMPLANT
KIT BASIN OR (CUSTOM PROCEDURE TRAY) ×3 IMPLANT
NDL HYPO 25X1 1.5 SAFETY (NEEDLE) IMPLANT
NEEDLE HYPO 22GX1.5 SAFETY (NEEDLE) ×2 IMPLANT
NEEDLE HYPO 25X1 1.5 SAFETY (NEEDLE) ×3 IMPLANT
NS IRRIG 1000ML POUR BTL (IV SOLUTION) ×3 IMPLANT
PACK GENERAL/GYN (CUSTOM PROCEDURE TRAY) ×3 IMPLANT
SPONGE GAUZE 2X2 STER 10/PKG (GAUZE/BANDAGES/DRESSINGS) ×1
SPONGE GAUZE 4X4 12PLY (GAUZE/BANDAGES/DRESSINGS) ×3 IMPLANT
STOCKINETTE 8 INCH (MISCELLANEOUS) IMPLANT
SWAB COLLECTION DEVICE MRSA (MISCELLANEOUS) IMPLANT
SYR CONTROL 10ML LL (SYRINGE) ×2 IMPLANT
TAPE CLOTH SURG 4X10 WHT LF (GAUZE/BANDAGES/DRESSINGS) ×2 IMPLANT
TAPE CLOTH SURG 6X10 WHT LF (GAUZE/BANDAGES/DRESSINGS) ×2 IMPLANT
TOWEL OR 17X26 10 PK STRL BLUE (TOWEL DISPOSABLE) ×3 IMPLANT
TUBE ANAEROBIC SPECIMEN COL (MISCELLANEOUS) IMPLANT
UNDERPAD 30X30 INCONTINENT (UNDERPADS AND DIAPERS) IMPLANT

## 2011-09-03 NOTE — Progress Notes (Signed)
For debridement of right breast wound today.

## 2011-09-03 NOTE — Progress Notes (Signed)
Pharmacy Brief Note:  Lovenox DVT Prophylaxis   Pharmacy following patient on Lovenox for DVT prophylaxis.  Patient has elevated BMI- so adjusted to 0.5mg /kg Q24h.  Now patient is post-op- will change dose back to 40mg  Q24h until immediate bleeding risk is reduced.  F/U bleeding and increase dose as appropriate.     Gean Birchwood, PharmD [phone]

## 2011-09-03 NOTE — Preoperative (Signed)
Beta Blockers   Reason not to administer Beta Blockers:Not Applicable 

## 2011-09-03 NOTE — Progress Notes (Signed)
Pt returned to unit from pacu. Alert and smiling as she heads to her room. Denies pain or any discomfort at this time. Vital signs wnl. Will continue to care for.

## 2011-09-03 NOTE — Transfer of Care (Signed)
Immediate Anesthesia Transfer of Care Note  Patient: [name]  Procedure(s) Performed: Procedure(s) (LRB): DEBRIDEMENT WOUND (Right)  Patient Location: PACU  Anesthesia Type: MAC  Level of Consciousness: awake, alert , oriented and patient cooperative  Airway & Oxygen Therapy: Patient Spontanous Breathing and Patient connected to face mask oxygen  Post-op Assessment: Report given to PACU RN, Post -op Vital signs reviewed and stable and Patient moving all extremities  Post vital signs: Reviewed and stable  Complications: No apparent anesthesia complications

## 2011-09-03 NOTE — Anesthesia Postprocedure Evaluation (Signed)
  Anesthesia Post-op Note  Patient: [name]  Procedure(s) Performed: Procedure(s) (LRB): DEBRIDEMENT WOUND (Right)  Patient Location: PACU  Anesthesia Type: MAC  Level of Consciousness: awake and alert   Airway and Oxygen Therapy: Patient Spontanous Breathing  Post-op Pain: mild  Post-op Assessment: Post-op Vital signs reviewed, Patient's Cardiovascular Status Stable, Respiratory Function Stable, Patent Airway and No signs of Nausea or vomiting  Post-op Vital Signs: stable  Complications: No apparent anesthesia complications

## 2011-09-03 NOTE — Anesthesia Preprocedure Evaluation (Signed)
Anesthesia Evaluation  Patient identified by MRN, date of birth, ID band Patient awake    Reviewed: Allergy & Precautions, H&P , NPO status , Patient's Chart, lab work & pertinent test results  Airway Mallampati: III TM Distance: <3 FB Neck ROM: Full    Dental No notable dental hx.    Pulmonary sleep apnea ,  breath sounds clear to auscultation  Pulmonary exam normal       Cardiovascular hypertension, negative cardio ROS  Rhythm:Regular Rate:Normal     Neuro/Psych negative neurological ROS  negative psych ROS   GI/Hepatic negative GI ROS, Neg liver ROS,   Endo/Other  negative endocrine ROSMorbid obesity  Renal/GU negative Renal ROS  negative genitourinary   Musculoskeletal negative musculoskeletal ROS (+)   Abdominal (+) + obese,   Peds negative pediatric ROS (+)  Hematology negative hematology ROS (+)   Anesthesia Other Findings   Reproductive/Obstetrics negative OB ROS                           Anesthesia Physical Anesthesia Plan  ASA: IV  Anesthesia Plan: MAC   Post-op Pain Management:    Induction: Intravenous  Airway Management Planned: Simple Face Mask  Additional Equipment:   Intra-op Plan:   Post-operative Plan:   Informed Consent: I have reviewed the patients History and Physical, chart, labs and discussed the procedure including the risks, benefits and alternatives for the proposed anesthesia with the patient or authorized representative who has indicated his/her understanding and acceptance.   Dental advisory given  Plan Discussed with: CRNA  Anesthesia Plan Comments:         Anesthesia Quick Evaluation

## 2011-09-03 NOTE — Progress Notes (Signed)
ANTIBIOTIC CONSULT NOTE - FOLLOW UP  Pharmacy Consult for Vancomycin Indication: Cellulitis with open wounds  Allergies  Allergen Reactions  . Effexor (Venlafaxine Hydrochloride) Anxiety    Made her want to kill herself    Patient Measurements: Height: 5\' 3"  (160 cm) Weight: 403 lb (182.8 kg) IBW/kg (Calculated) : 52.4   Vital Signs: Temp: 99.1 F (37.3 C) (03/14 1721) Temp src: Oral (03/14 1317) BP: 109/69 mmHg (03/14 1721) Pulse Rate: 69  (03/14 1721)  Labs:  Basename 09/03/11 0410 09/02/11 0245 09/02/11 0236  WBC 15.5* -- 17.5*  HGB 11.2* 12.2 12.2  PLT 176 -- 235  LABCREA -- -- --  CREATININE 1.76* 0.90 --   Estimated Creatinine Clearance: 54 ml/min (by C-G formula based on Cr of 1.76).  Basename 09/03/11 1940  VANCOTROUGH 28.1*  VANCOPEAK --  Drue Dun --  GENTTROUGH --  GENTPEAK --  GENTRANDOM --  TOBRATROUGH --  TOBRAPEAK --  TOBRARND --  AMIKACINPEAK --  AMIKACINTROU --  AMIKACIN --     Microbiology: Recent Results (from the past 720 hour(s))  CULTURE, BLOOD (ROUTINE X 2)     Status: Normal (Preliminary result)   Collection Time   09/02/11  2:36 AM      Component Value Range Status Comment   Specimen Description BLOOD LEFT HAND   Final    Special Requests BOTTLES DRAWN AEROBIC ONLY 3CC   Final    Culture  Setup Time [id number]   Final    Culture     Final    Value:        BLOOD CULTURE RECEIVED NO GROWTH TO DATE CULTURE WILL BE HELD FOR 5 DAYS BEFORE ISSUING A FINAL NEGATIVE REPORT   Report Status PENDING   Incomplete   CULTURE, BLOOD (ROUTINE X 2)     Status: Normal (Preliminary result)   Collection Time   09/02/11  2:45 AM      Component Value Range Status Comment   Specimen Description BLOOD RIGHT WRIST   Final    Special Requests BOTTLES DRAWN AEROBIC AND ANAEROBIC 4CC   Final    Culture  Setup Time [id number]   Final    Culture     Final    Value:        BLOOD CULTURE RECEIVED NO GROWTH TO DATE CULTURE WILL BE HELD FOR 5 DAYS  BEFORE ISSUING A FINAL NEGATIVE REPORT   Report Status PENDING   Incomplete   WOUND CULTURE     Status: Normal (Preliminary result)   Collection Time   09/02/11 10:39 AM      Component Value Range Status Comment   Specimen Description BREAST   Final    Special Requests NONE   Final    Gram Stain     Final    Value: NO WBC SEEN     NO SQUAMOUS EPITHELIAL CELLS SEEN     NO ORGANISMS SEEN   Culture     Final    Value: MODERATE GROUP B STREP(S.AGALACTIAE)ISOLATED     Note: TESTING AGAINST S. AGALACTIAE NOT ROUTINELY PERFORMED DUE TO PREDICTABILITY OF AMP/PEN/VAN SUSCEPTIBILITY.   Report Status PENDING   Incomplete     Anti-infectives     Start     Dose/Rate Route Frequency Ordered Stop   09/02/11 2226   ceFAZolin (ANCEF) IVPB 2 g/50 mL premix  Status:  Discontinued        2 g 100 mL/hr over 30 Minutes Intravenous 60 min pre-op 09/02/11 2226 09/03/11 1506  09/02/11 1400  piperacillin-tazobactam (ZOSYN) IVPB 3.375 g       3.375 g 12.5 mL/hr over 240 Minutes Intravenous 3 times per day 09/02/11 0700     09/02/11 0800   vancomycin (VANCOCIN) 1,500 mg in sodium chloride 0.9 % 500 mL IVPB        1,500 mg 250 mL/hr over 120 Minutes Intravenous Every 12 hours 09/02/11 0700     09/02/11 0415   vancomycin (VANCOCIN) IVPB 1000 mg/200 mL premix        1,000 mg 200 mL/hr over 60 Minutes Intravenous  Once 09/02/11 0404 09/02/11 0602   09/02/11 0415  piperacillin-tazobactam (ZOSYN) 3.375 g in dextrose 5 % 50 mL IVPB       3.375 g 100 mL/hr over 30 Minutes Intravenous  Once 09/02/11 0404 09/02/11 0458          Assessment:  61 yo F admit for cellulitis with 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  S/P I&D of right breast wound with removal of heterotopic calcifications on 3/14  Blood cultures: NGTD  Wound culture: MODERATE GROUP B STREP(S.AGALACTIAE)  SCr has doubled in the past day  Vancomycin trough level is supratherapeutic at 28  Goal of Therapy:  Vancomycin  trough level 15-20 mcg/ml  Plan:  Hold vancomycin tonight Re-check vancomycin random level as indicated and resume with reduced dosing. Measure antibiotic drug levels at steady state Follow up culture results, renal function   Lynann Beaver PharmD, BCPS Pager [phone] 09/03/2011 8:47 PM

## 2011-09-03 NOTE — Progress Notes (Signed)
Pt placed on CPAP for the night at current setting of 4cmH2O.  Pt has 2 L/M O2 bled-in.  Pt tolerating well at this time and encouraged to notify RN/ RT of any concerns.

## 2011-09-03 NOTE — Op Note (Signed)
Operative Note  [name] female 61 y.o. 09/03/2011  PREOPERATIVE ZO:XWRU right breast wound with heterotopic calcifications  POSTOPERATIVE DX:  Same  PROCEDURE:Debridement of right breast wound with removal of heterotopic calcifications( approximately 4 cm)         Surgeon: Adolph Pollack   Assistants: None  Anesthesia: Monitored Local Anesthesia with Sedation  Indications: This is a 61 year old female who had a right breast lumpectomy, axillary lymph node dissection, and radiation therapy for right breast cancer in the distant past. She has had a problem with her chronic open wound of the right breast and heterotopic calcifications have formed and the wound. This is not allowing the wound to heal. She presents now for debridement/removal of those calcifications.    Procedure Detail:  She was brought to the operating room and left on her bed. She was given intravenous sedation. The right breast was sterilely prepped and draped. Local anesthetic (1% Xylocaine) was infiltrated superficially and deep around the open wound. Using a Kocher and ronguer I removed a large volume of calcifications and some breast tissue. I removed it from the underlying breast incision and had to lengthen the previous open wound slightly in the upper outer quadrant.  After I had done this I irrigated the wound and hemostasis was adequate. The wound is packed with saline moistened gauze followed by dry dressing.  She tolerated the procedure well without any apparent complications and was taken to the recovery room in satisfactory condition.    Estimated Blood Loss:  Minimal         Drains: none          Blood Given: none          Specimens: Right Breast calcifications and tissue        Complications:  * No complications entered in OR log *         Disposition: PACU - hemodynamically stable.         Condition: stable

## 2011-09-03 NOTE — Progress Notes (Signed)
Subjective: Patient feeling better. She has notice some improvement in right arm redness. She is doing well after Procedure today.  Objective: Filed Vitals:   09/03/11 1630 09/03/11 1645 09/03/11 1700 09/03/11 1721  BP: 93/48 110/58 126/54 109/69  Pulse: 67 68 69 69  Temp: 98.9 F (37.2 C)  98.5 F (36.9 C) 99.1 F (37.3 C)  TempSrc:      Resp: 22 20 22 16   Height:      Weight:      SpO2: 100% 100% 100% 99%   Weight change:   Intake/Output Summary (Last 24 hours) at 09/03/11 1819 Last data filed at 09/03/11 1700  Gross per 24 hour  Intake 1982.5 ml  Output      5 ml  Net 1977.5 ml    General: Alert, awake, oriented x3, in no acute distress.  HEENT: No bruits, no goiter.  Heart: Regular rate and rhythm, without murmurs, rubs, gallops.  Lungs: CTA, bilateral air movement.  Abdomen: Soft, nontender, nondistended, positive bowel sounds.  Neuro: Grossly intact, nonfocal. Right Arm wit mild decrease redness, still with swelling.   Lab Results:  [facility] 09/03/11 0410 09/02/11 0245  NA 139 142  K 3.5 4.2  CL 106 111  CO2 21 --  GLUCOSE 91 134*  BUN 22 13  CREATININE 1.76* 0.90  CALCIUM 8.8 --  MG -- --  PHOS -- --    Basename 09/03/11 0410 09/02/11 0245 09/02/11 0236  WBC 15.5* -- 17.5*  NEUTROABS -- -- 15.9*  HGB 11.2* 12.2 --  HCT 33.3* 36.0 --  MCV 93.0 -- 92.9  PLT 176 -- 235    Micro Results: Recent Results (from the past 240 hour(s))  CULTURE, BLOOD (ROUTINE X 2)     Status: Normal (Preliminary result)   Collection Time   09/02/11  2:36 AM      Component Value Range Status Comment   Specimen Description BLOOD LEFT HAND   Final    Special Requests BOTTLES DRAWN AEROBIC ONLY 3CC   Final    Culture  Setup Time [id number]   Final    Culture     Final    Value:        BLOOD CULTURE RECEIVED NO GROWTH TO DATE CULTURE WILL BE HELD FOR 5 DAYS BEFORE ISSUING A FINAL NEGATIVE REPORT   Report Status PENDING   Incomplete   CULTURE, BLOOD (ROUTINE X 2)      Status: Normal (Preliminary result)   Collection Time   09/02/11  2:45 AM      Component Value Range Status Comment   Specimen Description BLOOD RIGHT WRIST   Final    Special Requests BOTTLES DRAWN AEROBIC AND ANAEROBIC 4CC   Final    Culture  Setup Time [id number]   Final    Culture     Final    Value:        BLOOD CULTURE RECEIVED NO GROWTH TO DATE CULTURE WILL BE HELD FOR 5 DAYS BEFORE ISSUING A FINAL NEGATIVE REPORT   Report Status PENDING   Incomplete   WOUND CULTURE     Status: Normal (Preliminary result)   Collection Time   09/02/11 10:39 AM      Component Value Range Status Comment   Specimen Description BREAST   Final    Special Requests NONE   Final    Gram Stain     Final    Value: NO WBC SEEN     NO SQUAMOUS EPITHELIAL CELLS SEEN  NO ORGANISMS SEEN   Culture     Final    Value: MODERATE GROUP B STREP(S.AGALACTIAE)ISOLATED     Note: TESTING AGAINST S. AGALACTIAE NOT ROUTINELY PERFORMED DUE TO PREDICTABILITY OF AMP/PEN/VAN SUSCEPTIBILITY.   Report Status PENDING   Incomplete     Studies/Results: Dg Humerus Right  09/02/2011  *RADIOLOGY REPORT*  Clinical Data: Right arm cellulitis  RIGHT HUMERUS - 2+ VIEW  Comparison: 09/03/2010 clavicle and shoulderradiographs  Findings:  Degenerative changes of the shoulder are partially imaged. No acute fracture or dislocation.  No aggressive osseous lesion.  There is a oval-shaped calcific density within the soft tissues of the arm posteriorly, nonspecific however of nonaggressive appearance.  IMPRESSION: No acute osseous abnormality identified.  Original Report Authenticated By: Waneta Martins, M.D.    Medications: I have reviewed the patient's current medications.   Cellulitis of arm, right (07/01/2011) Continue with Vancomycin and Zosyn day 2.  Open wound from breast probably source of infection.   BMI 70 and over, adult (07/01/2011)  HTN (hypertension) (07/01/2011) Continue with  bystolic. Hold lisinopril and HCTZ due to  worsening renal function.   Open wound of right breast, eroding calcifications  General surgery consulted. Patient status debridement and removal of calcification on 3-14.  Appreciate Dr Abbey Chatters help.   Acute renal insufficiency. Probably prerenal. Hold HCTZ, lisinopril. IV fluids. Repeat B-met in Am.  PT Consult for evaluation.      LOS: 2 days   Shalayah Beagley M.D.  [facility] 09/03/2011, 6:19 PM

## 2011-09-04 LAB — BASIC METABOLIC PANEL
Calcium: 9 mg/dL (ref 8.4–10.5)
Creatinine, Ser: 1.41 mg/dL — ABNORMAL HIGH (ref 0.50–1.10)
GFR calc non Af Amer: 39 mL/min — ABNORMAL LOW (ref >90)
Glucose, Bld: 101 mg/dL — ABNORMAL HIGH (ref 70–99)
Sodium: 138 mEq/L (ref 135–145)

## 2011-09-04 LAB — WOUND CULTURE

## 2011-09-04 LAB — CBC
Hemoglobin: 10.9 g/dL — ABNORMAL LOW (ref 12.0–15.0)
MCH: 31 pg (ref 26.0–34.0)
MCHC: 33 g/dL (ref 30.0–36.0)
MCV: 93.8 fL (ref 78.0–100.0)
Platelets: 207 10*3/uL (ref 150–400)

## 2011-09-04 MED ORDER — AMPICILLIN-SULBACTAM SODIUM 3 (2-1) G IJ SOLR
3.0000 g | Freq: Three times a day (TID) | INTRAMUSCULAR | Status: DC
  Administered 2011-09-04 – 2011-09-07 (×10): 3 g via INTRAVENOUS
  Filled 2011-09-04 (×12): qty 3

## 2011-09-04 NOTE — Progress Notes (Signed)
ANTIBIOTIC CONSULT NOTE - INITIAL  Pharmacy Consult for Unasyn Indication: Cellulitis of breast  Allergies  Allergen Reactions  . Effexor (Venlafaxine Hydrochloride) Anxiety    Made her want to kill herself   Labs:  [facility] 09/04/11 0413 09/03/11 0410 09/02/11 0245 09/02/11 0236  WBC 12.6* 15.5* -- 17.5*  HGB 10.9* 11.2* 12.2 --  PLT 207 176 -- 235  LABCREA -- -- -- --  CREATININE 1.41* 1.76* 0.90 --   Estimated Creatinine Clearance: 67.4 ml/min (by C-G formula based on Cr of 1.41).  Basename 09/03/11 1940  VANCOTROUGH 28.1*  VANCOPEAK --  Drue Dun --  GENTTROUGH --  GENTPEAK --  GENTRANDOM --  TOBRATROUGH --  TOBRAPEAK --  TOBRARND --  AMIKACINPEAK --  AMIKACINTROU --  AMIKACIN --     Microbiology: Recent Results (from the past 720 hour(s))  CULTURE, BLOOD (ROUTINE X 2)     Status: Normal (Preliminary result)   Collection Time   09/02/11  2:36 AM      Component Value Range Status Comment   Specimen Description BLOOD LEFT HAND   Final    Special Requests BOTTLES DRAWN AEROBIC ONLY 3CC   Final    Culture  Setup Time [id number]   Final    Culture     Final    Value:        BLOOD CULTURE RECEIVED NO GROWTH TO DATE CULTURE WILL BE HELD FOR 5 DAYS BEFORE ISSUING A FINAL NEGATIVE REPORT   Report Status PENDING   Incomplete   CULTURE, BLOOD (ROUTINE X 2)     Status: Normal (Preliminary result)   Collection Time   09/02/11  2:45 AM      Component Value Range Status Comment   Specimen Description BLOOD RIGHT WRIST   Final    Special Requests BOTTLES DRAWN AEROBIC AND ANAEROBIC 4CC   Final    Culture  Setup Time [id number]   Final    Culture     Final    Value:        BLOOD CULTURE RECEIVED NO GROWTH TO DATE CULTURE WILL BE HELD FOR 5 DAYS BEFORE ISSUING A FINAL NEGATIVE REPORT   Report Status PENDING   Incomplete   WOUND CULTURE     Status: Normal   Collection Time   09/02/11 10:39 AM      Component Value Range Status Comment   Specimen Description BREAST    Final    Special Requests NONE   Final    Gram Stain     Final    Value: NO WBC SEEN     NO SQUAMOUS EPITHELIAL CELLS SEEN     NO ORGANISMS SEEN   Culture     Final    Value: MODERATE GROUP B STREP(S.AGALACTIAE)ISOLATED     Note: TESTING AGAINST S. AGALACTIAE NOT ROUTINELY PERFORMED DUE TO PREDICTABILITY OF AMP/PEN/VAN SUSCEPTIBILITY.   Report Status 09/04/2011 FINAL   Final     Medical History: Past Medical History  Diagnosis Date  . Obesities, morbid   . Hypertension   . Cellulitis     right breast, right arm, ble  . Depression   . Anxiety   . 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 Obstructive sleep apnea     CPAP  . Breast cancer 1988    Medications:  Anti-infectives     Start  Dose/Rate Route Frequency Ordered Stop   09/04/11 1200   Ampicillin-Sulbactam (UNASYN) 3 g in sodium chloride 0.9 % 100 mL IVPB        3 g 100 mL/hr over 60 Minutes Intravenous Every 8 hours 09/04/11 1003     09/02/11 2226   ceFAZolin (ANCEF) IVPB 2 g/50 mL premix  Status:  Discontinued        2 g 100 mL/hr over 30 Minutes Intravenous 60 min pre-op 09/02/11 2226 09/03/11 1506   09/02/11 1400   piperacillin-tazobactam (ZOSYN) IVPB 3.375 g  Status:  Discontinued        3.375 g 12.5 mL/hr over 240 Minutes Intravenous 3 times per day 09/02/11 0700 09/04/11 0946   09/02/11 0800   vancomycin (VANCOCIN) 1,500 mg in sodium chloride 0.9 % 500 mL IVPB  Status:  Discontinued        1,500 mg 250 mL/hr over 120 Minutes Intravenous Every 12 hours 09/02/11 0700 09/03/11 2046   09/02/11 0415   vancomycin (VANCOCIN) IVPB 1000 mg/200 mL premix        1,000 mg 200 mL/hr over 60 Minutes Intravenous  Once 09/02/11 0404 09/02/11 0602   09/02/11 0415  piperacillin-tazobactam (ZOSYN) 3.375 g in dextrose 5 % 50 mL IVPB       3.375 g 100 mL/hr over 30 Minutes Intravenous  Once 09/02/11 0404 09/02/11 0458          Assessment:  60 yo F with chronic wounds L & R breast  I&D of R breast wound w/removal of heterotopic calcifications 3/14  Wound cx: Group B strep; no susceptibility testing done: 100% susceptible to Ampicillin/antibiogram  Initial abx therapy with Vancomcyin/Zosyn, Vanc trough elevated, Vanc held  D/c Vanc/Zosyn, begin Unasyn. Can transition to po Augmentin when appropriate  Goal of Therapy:  Dose/schedule appropriate for organism, renal function  Plan:  Unasyn 3gm q8h Monitor renal function.  Otho Bellows PharmD Pager [phone] 09/04/2011,10:03 AM

## 2011-09-04 NOTE — Progress Notes (Signed)
Subjective: Patient feeling with more energy than ever. Feels swelling and redness ahs decrease.  Objective: Filed Vitals:   09/03/11 1700 09/03/11 1721 09/03/11 2156 09/04/11 0509  BP: 126/54 109/69 123/67 176/72  Pulse: 69 69 74 81  Temp: 98.5 F (36.9 C) 99.1 F (37.3 C) 99 F (37.2 C) 99.2 F (37.3 C)  TempSrc:   Oral Oral  Resp: 22 16 20 20   Height:      Weight:      SpO2: 100% 99% 97% 91%   Weight change:   Intake/Output Summary (Last 24 hours) at 09/04/11 1127 Last data filed at 09/04/11 0900  Gross per 24 hour  Intake 2582.5 ml  Output      6 ml  Net 2576.5 ml    General: Alert, awake, oriented x3, in no acute distress.  HEENT: No bruits, no goiter.  Heart: Regular rate and rhythm, without murmurs, rubs, gallops.  Lungs: CTA bilateral air movement.  Abdomen: Soft, nontender, nondistended, positive bowel sounds.  Neuro: Grossly intact, nonfocal. Extremities; right arm with decrease redness, and swelling.  Chest : right breast wound with dressing.  Lab Results:  [facility] 09/04/11 0413 09/03/11 0410  NA 138 139  K 4.5 3.5  CL 105 106  CO2 25 21  GLUCOSE 101* 91  BUN 22 22  CREATININE 1.41* 1.76*  CALCIUM 9.0 8.8  MG -- --  PHOS -- --    Basename 09/04/11 0413 09/03/11 0410 09/02/11 0236  WBC 12.6* 15.5* --  NEUTROABS -- -- 15.9*  HGB 10.9* 11.2* --  HCT 33.0* 33.3* --  MCV 93.8 93.0 --  PLT 207 176 --    Micro Results: Recent Results (from the past 240 hour(s))  CULTURE, BLOOD (ROUTINE X 2)     Status: Normal (Preliminary result)   Collection Time   09/02/11  2:36 AM      Component Value Range Status Comment   Specimen Description BLOOD LEFT HAND   Final    Special Requests BOTTLES DRAWN AEROBIC ONLY 3CC   Final    Culture  Setup Time [id number]   Final    Culture     Final    Value:        BLOOD CULTURE RECEIVED NO GROWTH TO DATE CULTURE WILL BE HELD FOR 5 DAYS BEFORE ISSUING A FINAL NEGATIVE REPORT   Report Status PENDING   Incomplete     CULTURE, BLOOD (ROUTINE X 2)     Status: Normal (Preliminary result)   Collection Time   09/02/11  2:45 AM      Component Value Range Status Comment   Specimen Description BLOOD RIGHT WRIST   Final    Special Requests BOTTLES DRAWN AEROBIC AND ANAEROBIC 4CC   Final    Culture  Setup Time [id number]   Final    Culture     Final    Value:        BLOOD CULTURE RECEIVED NO GROWTH TO DATE CULTURE WILL BE HELD FOR 5 DAYS BEFORE ISSUING A FINAL NEGATIVE REPORT   Report Status PENDING   Incomplete   WOUND CULTURE     Status: Normal   Collection Time   09/02/11 10:39 AM      Component Value Range Status Comment   Specimen Description BREAST   Final    Special Requests NONE   Final    Gram Stain     Final    Value: NO WBC SEEN     NO SQUAMOUS EPITHELIAL  CELLS SEEN     NO ORGANISMS SEEN   Culture     Final    Value: MODERATE GROUP B STREP(S.AGALACTIAE)ISOLATED     Note: TESTING AGAINST S. AGALACTIAE NOT ROUTINELY PERFORMED DUE TO PREDICTABILITY OF AMP/PEN/VAN SUSCEPTIBILITY.   Report Status 09/04/2011 FINAL   Final     Studies/Results: No results found.  Medications: I have reviewed the patient's current medications.  Cellulitis of arm, right (07/01/2011 Received  Vancomycin and Zosyn for 2 days.  Open wound from breast probably source of infection.  Wound culture growth strept. Start Unasyn day 1. WBC trending down. BMI 70 and over, adult (07/01/2011)  HTN (hypertension) (07/01/2011) Continue with bystolic. Hold lisinopril and HCTZ due to worsening renal function.   Open wound of right breast, eroding calcifications  General surgery consulted. Patient status debridement and removal of calcification on 3-14.  Appreciate Dr Abbey Chatters help.  Acute renal insufficiency. Probably prerenal. Hold HCTZ, lisinopril. IV fluids. Vancomycin level elevated. Renal function improved.  PT Consult for evaluation.        LOS: 3 days   Kodie Pick M.D.  [facility] 09/04/2011, 11:27  AM

## 2011-09-04 NOTE — Evaluation (Signed)
Physical Therapy Evaluation Patient Details Name: [name] MRN: [id number] DOB: 27-Nov-1950 Today's Date: 09/04/2011  Problem List:  Patient Active Problem List  Diagnoses  . Cellulitis of arm, right  . Cellulitis of breast  . BMI 70 and over, adult  . HTN (hypertension)  . Breast calcifications  . Venous stasis dermatitis  . Open wound of right breast, eroding calcifications  . Acute lymphangitis of right upper extremity    Past Medical History:  Past Medical History  Diagnosis Date  . Obesities, morbid   . Hypertension   . Cellulitis     right breast, right arm, ble  . Depression   . Anxiety   . 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 Obstructive sleep apnea     CPAP  . Breast cancer 1988   Past Surgical History:  Past Surgical History  Procedure Date  . Right lumpectomy 1988  . Abdominal hysterectomy 1991  . Right carpal tunnel release 1995  . Axillary lymph node dissection 1988    PT Assessment/Plan/Recommendation PT Assessment Clinical Impression Statement: Pt presents s/p cellulitis of R arm and I&D of right breast wound with decreased overall strength and mobility.  Took some steps from bed to chair.  Noted that O2 sats dropped to 81% on room air with transfer.  Cues given for pursed lip breathing in order to return sats to 90's.  Pt will benefit from skilled PT in acute venue to address deficits.  PT recommends HHPT for follow up therapy due to pt having assistance from both neighbors.  May need home O2, therefore upon seeing pt on next visit, recommend ambulation on and off of O2 and measure sats.  PT Recommendation/Assessment: Patient will need skilled PT in the acute care venue PT Problem List: Decreased strength;Decreased activity tolerance;Decreased balance;Decreased mobility;Decreased knowledge of use of DME;Pain;Obesity Barriers to Discharge: Decreased  caregiver support Barriers to Discharge Comments: Pt is unable to have 24/7 supervision, however pt states that neighbors are available to check on her intermittently PT Therapy Diagnosis : Difficulty walking;Generalized weakness;Acute pain;Abnormality of gait PT Plan PT Frequency: Min 3X/week PT Treatment/Interventions: DME instruction;Gait training;Functional mobility training;Therapeutic activities;Stair training;Therapeutic exercise;Patient/family education;Balance training PT Recommendation Follow Up Recommendations: Home health PT;Supervision - Intermittent Equipment Recommended: None recommended by PT PT Goals  Acute Rehab PT Goals PT Goal Formulation: With patient Time For Goal Achievement: 2 weeks Pt will go Supine/Side to Sit: with modified independence PT Goal: Supine/Side to Sit - Progress: Goal set today Pt will go Sit to Supine/Side: with modified independence PT Goal: Sit to Supine/Side - Progress: Goal set today Pt will go Sit to Stand: with modified independence PT Goal: Sit to Stand - Progress: Goal set today Pt will go Stand to Sit: with modified independence PT Goal: Stand to Sit - Progress: Goal set today Pt will Ambulate: 51 - 150 feet;with supervision;with least restrictive assistive device PT Goal: Ambulate - Progress: Goal set today Pt will Go Up / Down Stairs: 1-2 stairs;with supervision;with least restrictive assistive device PT Goal: Up/Down Stairs - Progress: Goal set today  PT Evaluation Precautions/Restrictions  Precautions Precautions: Fall Required Braces or Orthoses: No Restrictions Weight Bearing Restrictions: No Prior Functioning  Home Living Lives With: Alone Receives Help From: Neighbor Type of Home: House Home Layout: One level Home Access: Stairs to enter [employer] of Steps: 1 small step  Bathroom Shower/Tub: Teacher, adult education: Walker - rolling Prior Function Level of Independence: Independent with  basic ADLs;Independent with gait;Independent with transfers Cognition Cognition Arousal/Alertness: Awake/alert Overall Cognitive Status: Appears within functional limits for tasks assessed Orientation Level: Oriented X4 Sensation/Coordination Sensation Light Touch: Appears Intact Additional Comments: Pt states she has some tingling and shooting pain in feet at night Coordination Gross Motor Movements are Fluid and Coordinated: Yes Extremity Assessment RLE Assessment RLE Assessment: Exceptions to [facility] RLE Strength RLE Overall Strength Comments: Grossly WFL per functional assessment, ankle DF/PF 5/5 LLE Assessment LLE Assessment: Exceptions to [facility] LLE Strength LLE Overall Strength Comments: Grossly WFL per functional assessment, ankle DF/PF 5/5. Mobility (including Balance) Bed Mobility Bed Mobility: Yes Supine to Sit: 1: +2 Total assist;Patient percentage (comment);With rails;HOB elevated (Comment degrees) Supine to Sit Details (indicate cue type and reason): Pt assist 85%.  +2 for safety and line management.  Cues provided for technique and hand placement.  Transfers Transfers: Yes Sit to Stand: 1: +2 Total assist;Patient percentage (comment);From elevated surface;With upper extremity assist;From bed Sit to Stand Details (indicate cue type and reason): Pt assist 85%.  +2 for safety/line management.  cues for hand placement and safety. Requires hand held assist from therapist for steadying/support.  Stand to Sit: 1: +2 Total assist;Patient percentage (comment);With upper extremity assist;With armrests;To chair/3-in-1 Stand to Sit Details: Pt assist 85%.  +2 for safety and line management.  Cues for hand placement.  Requires hand held assist from therapists for steadying/support.  Ambulation/Gait Ambulation/Gait: Yes Ambulation/Gait Assistance: 1: +2 Total assist;Patient percentage (comment) Ambulation/Gait Assistance Details (indicate cue type and reason): Pt assist 75%.  Requires +2  hand held assist for steadying with cues for safety.  Ambulation Distance (Feet): 5 Feet Assistive device: 2 person hand held assist Gait Pattern: Step-through pattern;Decreased stride length;Trunk flexed Gait velocity: decreased Stairs: No Wheelchair Mobility Wheelchair Mobility: No    Exercise    End of Session PT - End of Session Activity Tolerance: Patient limited by fatigue;Patient tolerated treatment well Patient left: in chair;with call bell in reach Nurse Communication: Mobility status for transfers;Mobility status for ambulation General Behavior During Session: [facility] for tasks performed Cognition: [facility] for tasks performed  Page, Meribeth Mattes 09/04/2011, 12:10 PM

## 2011-09-04 NOTE — Progress Notes (Signed)
1 Day Post-Op  Subjective: Less RUE discomfort  Objective: Vital signs in last 24 hours: Temp:  [98.5 F (36.9 C)-100.1 F (37.8 C)] 99.2 F (37.3 C) (03/15 0509) Pulse Rate:  [67-81] 81  (03/15 0509) Resp:  [16-22] 20  (03/15 0509) BP: (93-176)/(48-72) 176/72 mmHg (03/15 0509) SpO2:  [91 %-100 %] 91 % (03/15 0509) Last BM Date: 09/03/11  Intake/Output from previous day: 03/14 0701 - 03/15 0700 In: 2102.5 [P.O.:460; I.V.:1117.5; IV Piggyback:525] Out: 6 [Urine:1; Blood:5] Intake/Output this shift: Total I/O In: 480 [P.O.:480] Out: -   PE: Right breast-superior wound clean and repacked; inferior wound clean and closing  Lab Results:   Basename 09/04/11 0413 09/03/11 0410  WBC 12.6* 15.5*  HGB 10.9* 11.2*  HCT 33.0* 33.3*  PLT 207 176   BMET  Basename 09/04/11 0413 09/03/11 0410  NA 138 139  K 4.5 3.5  CL 105 106  CO2 25 21  GLUCOSE 101* 91  BUN 22 22  CREATININE 1.41* 1.76*  CALCIUM 9.0 8.8   PT/INR No results found for this basename: LABPROT:2,INR:2 in the last 72 hours Comprehensive Metabolic Panel:    Component Value Date/Time   NA 138 09/04/2011 0413   K 4.5 09/04/2011 0413   CL 105 09/04/2011 0413   CO2 25 09/04/2011 0413   BUN 22 09/04/2011 0413   CREATININE 1.41* 09/04/2011 0413   GLUCOSE 101* 09/04/2011 0413   CALCIUM 9.0 09/04/2011 0413   AST 26 07/05/2011 0418   ALT 15 07/05/2011 0418   ALKPHOS 73 07/05/2011 0418   BILITOT 0.4 07/05/2011 0418   PROT 7.2 07/05/2011 0418   ALBUMIN 2.3* 07/05/2011 0418     Studies/Results: No results found.  Anti-infectives: Anti-infectives     Start     Dose/Rate Route Frequency Ordered Stop   09/04/11 1200   Ampicillin-Sulbactam (UNASYN) 3 g in sodium chloride 0.9 % 100 mL IVPB        3 g 100 mL/hr over 60 Minutes Intravenous Every 8 hours 09/04/11 1003     09/02/11 2226   ceFAZolin (ANCEF) IVPB 2 g/50 mL premix  Status:  Discontinued        2 g 100 mL/hr over 30 Minutes Intravenous 60 min pre-op 09/02/11  2226 09/03/11 1506   09/02/11 1400   piperacillin-tazobactam (ZOSYN) IVPB 3.375 g  Status:  Discontinued        3.375 g 12.5 mL/hr over 240 Minutes Intravenous 3 times per day 09/02/11 0700 09/04/11 0946   09/02/11 0800   vancomycin (VANCOCIN) 1,500 mg in sodium chloride 0.9 % 500 mL IVPB  Status:  Discontinued        1,500 mg 250 mL/hr over 120 Minutes Intravenous Every 12 hours 09/02/11 0700 09/03/11 2046   09/02/11 0415   vancomycin (VANCOCIN) IVPB 1000 mg/200 mL premix        1,000 mg 200 mL/hr over 60 Minutes Intravenous  Once 09/02/11 0404 09/02/11 0602   09/02/11 0415  piperacillin-tazobactam (ZOSYN) 3.375 g in dextrose 5 % 50 mL IVPB       3.375 g 100 mL/hr over 30 Minutes Intravenous  Once 09/02/11 0404 09/02/11 0458          Assessment Active Problems:  Open wound of right breast, eroding calcifications-s/p debridement      LOS: 3 days   Plan: Start bid NS damp to dry dressing changes.   Zan Triska J 09/04/2011

## 2011-09-04 NOTE — Progress Notes (Signed)
CRITICAL VALUE ALERT  Critical value received: Vancomycin trough 28.1  Date of notification:  09/03/10  Time of notification:  20:30  Critical value read back: yes  Nurse who received alert:  Estelle June, RN  MD notified (1st page):  Dr. Betti Cruz  Time of first page:  20:34  MD notified (2nd page):  Time of second page:  Responding MD:  Dr. Betti Cruz  Time MD responded:  20:36

## 2011-09-04 NOTE — Evaluation (Signed)
Occupational Therapy Evaluation Patient Details Name: [name] MRN: [id number] DOB: [birth year] Today's Date: 09/04/2011  Problem List:  Patient Active Problem List  Diagnoses  . Cellulitis of arm, right  . Cellulitis of breast  . BMI 70 and over, adult  . HTN (hypertension)  . Breast calcifications  . Venous stasis dermatitis  . Open wound of right breast, eroding calcifications  . Acute lymphangitis of right upper extremity    Past Medical History:  Past Medical History  Diagnosis Date  . Obesities, morbid   . Hypertension   . Cellulitis     right breast, right arm, ble  . Depression   . Anxiety   . 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 Obstructive sleep apnea     CPAP  . Breast cancer 1988   Past Surgical History:  Past Surgical History  Procedure Date  . Right lumpectomy 1988  . Abdominal hysterectomy 1991  . Right carpal tunnel release 1995  . Axillary lymph node dissection 1988    OT Assessment/Plan/Recommendation OT Assessment Clinical Impression Statement: Pt is a 61 yo morbidly obese female diagnosed with cellulitis of R arm. Skilled OT recommended to maximize I w/BADLs to supervision level in prep for safe d/c home with HHOT and HH aide. OT Recommendation/Assessment: Patient will need skilled OT in the acute care venue OT Problem List: Decreased activity tolerance;Decreased safety awareness;Decreased knowledge of use of DME or AE;Cardiopulmonary status limiting activity Barriers to Discharge: Decreased caregiver support OT Therapy Diagnosis : Generalized weakness OT Plan OT Frequency: Min 2X/week OT Treatment/Interventions: Self-care/ADL training;Therapeutic activities;DME and/or AE instruction;Patient/family education OT Recommendation Follow Up Recommendations: Home health OT;Other (comment) (HH aide.) Equipment Recommended: None recommended by  OT Individuals Consulted Consulted and Agree with Results and Recommendations: Patient OT Goals Acute Rehab OT Goals OT Goal Formulation: With patient Time For Goal Achievement: 2 weeks ADL Goals Pt Will Perform Lower Body Bathing: with supervision;Sit to stand from chair;Sit to stand from bed;with adaptive equipment ADL Goal: Lower Body Bathing - Progress: Goal set today Pt Will Perform Lower Body Dressing: with supervision;Sit to stand from bed;Sit to stand from chair;with adaptive equipment ADL Goal: Lower Body Dressing - Progress: Goal set today Pt Will Transfer to Toilet: with supervision;Extra wide 3-in-1;Regular height toilet;Ambulation;3-in-1 ADL Goal: Toilet Transfer - Progress: Goal set today Pt Will Perform Toileting - Clothing Manipulation: with supervision;Standing ADL Goal: Toileting - Clothing Manipulation - Progress: Goal set today Pt Will Perform Toileting - Hygiene: with supervision;Sit to stand from 3-in-1/toilet;with adaptive equipment ADL Goal: Toileting - Hygiene - Progress: Goal set today Pt Will Perform Tub/Shower Transfer: Tub transfer;with supervision;Ambulation ADL Goal: Tub/Shower Transfer - Progress: Goal set today  OT Evaluation Precautions/Restrictions  Precautions Precautions: Fall Required Braces or Orthoses: No Restrictions Weight Bearing Restrictions: No Prior Functioning Home Living Lives With: Alone Receives Help From: Neighbor Type of Home: House Home Layout: One level Home Access: Stairs to enter [employer] of Steps: small threshold Bathroom Shower/Tub: Engineer, manufacturing systems: Standard Home Adaptive Equipment: Walker - rolling Prior Function Level of Independence: Independent with basic ADLs;Independent with homemaking with ambulation;Independent with transfers;Independent with gait ADL ADL Grooming: Performed;Wash/dry face Where Assessed - Grooming: Sitting, bed;Unsupported Upper Body Bathing: Simulated;Set  up Where Assessed - Upper Body Bathing: Sitting, bed;Unsupported Lower Body Bathing: Performed;+1 Total assistance Where Assessed - Lower Body Bathing: Sit to  stand from bed Upper Body Dressing: Simulated;Set up Where Assessed - Upper Body Dressing: Sitting, bed;Unsupported Lower Body Dressing: Maximal assistance;Performed (Pt states that she only wears dresses and slip on shoes) Lower Body Dressing Details (indicate cue type and reason): for socks Where Assessed - Lower Body Dressing: Sit to stand from bed Toilet Transfer: Simulated;Minimal assistance Toilet Transfer Details (indicate cue type and reason): PtsO2 saturation decreased to 81 just with bed<>chair transfer. O2 reapplied. Toilet Transfer Method: Stand pivot Toileting - Clothing Manipulation: Simulated;Minimal assistance Toileting - Hygiene: Simulated;+1 Total assistance Where Assessed - Toileting Hygiene: Standing Tub/Shower Transfer: Not assessed Tub/Shower Transfer Method: Not assessed ADL Comments: Pt fatigues very easily. Pt states she is able to reach back peri area at home for hygeine and bathing but was unable at eval due to morbid obesity. Vision/Perception    Cognition Cognition Arousal/Alertness: Awake/alert Overall Cognitive Status: Appears within functional limits for tasks assessed Orientation Level: Oriented X4 Sensation/Coordination Sensation Light Touch: Appears Intact Additional Comments: Pt states she has some tingling and shooting pain in feet at night Coordination Gross Motor Movements are Fluid and Coordinated: Yes Extremity Assessment RUE Assessment RUE Assessment: Within Functional Limits (appears red and flaky) LUE Assessment LUE Assessment: Within Functional Limits Mobility  Bed Mobility Bed Mobility: Yes Supine to Sit: 1: +2 Total assist;Patient percentage (comment);With rails;HOB elevated (Comment degrees) Supine to Sit Details (indicate cue type and reason): Pt assist 85%.  +2 for safety  and line management.  Cues provided for technique and hand placement.  Transfers Sit to Stand: 1: +2 Total assist;Patient percentage (comment);From elevated surface;With upper extremity assist;From bed Sit to Stand Details (indicate cue type and reason): Pt assist 85%.  +2 for safety/line management.  cues for hand placement and safety. Requires hand held assist from therapist for steadying/support.  Stand to Sit: 1: +2 Total assist;Patient percentage (comment);With upper extremity assist;With armrests;To chair/3-in-1 Stand to Sit Details: Pt assist 85%.  +2 for safety and line management.  Cues for hand placement.  Requires hand held assist from therapists for steadying/support.  Exercises   End of Session OT - End of Session Activity Tolerance: Patient limited by fatigue Patient left: in chair;with call bell in reach Nurse Communication: Mobility status for transfers General Behavior During Session: [facility] for tasks performed Cognition: [facility] for tasks performed   Marven Veley A OTR/L [phone] 09/04/2011, 1:36 PM

## 2011-09-05 LAB — BASIC METABOLIC PANEL
BUN: 14 mg/dL (ref 6–23)
Calcium: 9 mg/dL (ref 8.4–10.5)
GFR calc Af Amer: 63 mL/min — ABNORMAL LOW (ref >90)
GFR calc non Af Amer: 55 mL/min — ABNORMAL LOW (ref >90)
Potassium: 3.6 mEq/L (ref 3.5–5.1)

## 2011-09-05 MED ORDER — DIPHENHYDRAMINE HCL 25 MG PO CAPS
25.0000 mg | ORAL_CAPSULE | Freq: Three times a day (TID) | ORAL | Status: DC | PRN
  Administered 2011-09-06 – 2011-09-07 (×3): 25 mg via ORAL
  Filled 2011-09-05 (×3): qty 1

## 2011-09-05 MED ORDER — GABAPENTIN 300 MG PO CAPS
300.0000 mg | ORAL_CAPSULE | Freq: Every day | ORAL | Status: DC
  Administered 2011-09-05 – 2011-09-07 (×3): 300 mg via ORAL
  Filled 2011-09-05 (×4): qty 1

## 2011-09-05 MED ORDER — ACETAMINOPHEN 325 MG PO TABS: 650.0000 mg | ORAL_TABLET | Freq: Four times a day (QID) | ORAL | Status: DC | PRN

## 2011-09-05 NOTE — Progress Notes (Signed)
Occupational Therapy Note Attempted to see patient for OT this am but she states she doesn't feel good and cant get out of bed right now. Gave nsg tech this OT's pager number when patient ready to try OOB later today. Will reattempt as schedule permits also. Judithann Sauger OTR/L [phone] 09/05/2011

## 2011-09-05 NOTE — Progress Notes (Signed)
PT Cancellation Note  09/05/2011 @ 13:30  Attempted again this afternoon.  Pt was OOB on recliner.  "Not right now, I have company" stated pt.   Felecia Shelling  PTA [facility]  Acute  Rehab Pager     [phone]

## 2011-09-05 NOTE — Progress Notes (Signed)
PT Cancellation Note  ___Treatment cancelled today due to medical issues with patient which prohibited therapy  ___ Treatment cancelled today due to patient receiving procedure or test   _X_ Treatment cancelled this AM 2nd pt c/o poor night last night and wanted to rest in bed this morning.  Will come back after lunch.  Pt stated "if I feel like getting out of bed, I will get out of bed.  I don't need a therapist to show me how".    Felecia Shelling  PTA [facility]  Acute  Rehab Pager     [phone]

## 2011-09-05 NOTE — Progress Notes (Signed)
Pt placed on cpap 8 cmH2O with 2L O2 at this time. Pt tolerating well.  Jacqulynn Cadet RRT

## 2011-09-05 NOTE — Progress Notes (Addendum)
Subjective: Had some headache earlier. No more headaches.  She was feeling tired today and was not able  to work with OT. She is willing to do so later today or tomorrow.   She wants something for itching.  Objective: Filed Vitals:   09/04/11 1357 09/04/11 2106 09/04/11 2215 09/05/11 0519  BP: 123/71 126/65  134/53  Pulse: 72 75  79  Temp: 99.6 F (37.6 C) 99.6 F (37.6 C)  100.2 F (37.9 C)  TempSrc: Oral Oral  Oral  Resp: 21 22 18 20   Height:      Weight:      SpO2: 93% 91% 92% 93%   Weight change:   Intake/Output Summary (Last 24 hours) at 09/05/11 1446 Last data filed at 09/05/11 1349  Gross per 24 hour  Intake   1025 ml  Output      0 ml  Net   1025 ml    General: Alert, awake, oriented x3, in no acute distress.  HEENT: No bruits, no goiter.  Heart: Regular rate and rhythm, without murmurs, rubs, gallops.  Lungs: CTA, bilateral air movement.  Abdomen: Soft, nontender, nondistended, positive bowel sounds.  Neuro: Grossly intact, nonfocal. Extremities; Right arm swelling has decreases, redness decreases.   Lab Results:  [facility] 09/04/11 0413 09/03/11 0410  NA 138 139  K 4.5 3.5  CL 105 106  CO2 25 21  GLUCOSE 101* 91  BUN 22 22  CREATININE 1.41* 1.76*  CALCIUM 9.0 8.8  MG -- --  PHOS -- --    Basename 09/04/11 0413 09/03/11 0410  WBC 12.6* 15.5*  NEUTROABS -- --  HGB 10.9* 11.2*  HCT 33.0* 33.3*  MCV 93.8 93.0  PLT 207 176    Micro Results: Recent Results (from the past 240 hour(s))  CULTURE, BLOOD (ROUTINE X 2)     Status: Normal (Preliminary result)   Collection Time   09/02/11  2:36 AM      Component Value Range Status Comment   Specimen Description BLOOD LEFT HAND   Final    Special Requests BOTTLES DRAWN AEROBIC ONLY 3CC   Final    Culture  Setup Time [id number]   Final    Culture     Final    Value:        BLOOD CULTURE RECEIVED NO GROWTH TO DATE CULTURE WILL BE HELD FOR 5 DAYS BEFORE ISSUING A FINAL NEGATIVE REPORT   Report Status  PENDING   Incomplete   CULTURE, BLOOD (ROUTINE X 2)     Status: Normal (Preliminary result)   Collection Time   09/02/11  2:45 AM      Component Value Range Status Comment   Specimen Description BLOOD RIGHT WRIST   Final    Special Requests BOTTLES DRAWN AEROBIC AND ANAEROBIC 4CC   Final    Culture  Setup Time [id number]   Final    Culture     Final    Value:        BLOOD CULTURE RECEIVED NO GROWTH TO DATE CULTURE WILL BE HELD FOR 5 DAYS BEFORE ISSUING A FINAL NEGATIVE REPORT   Report Status PENDING   Incomplete   WOUND CULTURE     Status: Normal   Collection Time   09/02/11 10:39 AM      Component Value Range Status Comment   Specimen Description BREAST   Final    Special Requests NONE   Final    Gram Stain     Final  Value: NO WBC SEEN     NO SQUAMOUS EPITHELIAL CELLS SEEN     NO ORGANISMS SEEN   Culture     Final    Value: MODERATE GROUP B STREP(S.AGALACTIAE)ISOLATED     Note: TESTING AGAINST S. AGALACTIAE NOT ROUTINELY PERFORMED DUE TO PREDICTABILITY OF AMP/PEN/VAN SUSCEPTIBILITY.   Report Status 09/04/2011 FINAL   Final     Studies/Results: No results found.  Medications: I have reviewed the patient's current medications.  Cellulitis of arm, right (07/01/2011  Received Vancomycin and Zosyn for 2 days.  Open wound from breast probably source of infection.  Wound culture growth strept.  Unasyn day 2.  WBC trending down. Patient had mild fever at 100. Will continue to monitor. If spike fever might need to change antibiotics.  BMI 70 and over, adult (07/01/2011)  HTN (hypertension) (07/01/2011) Continue with bystolic. Hold lisinopril and HCTZ due to worsening renal function. bmet pending,  Open wound of right breast, eroding calcifications  General surgery consulted. Patient status debridement and removal of calcification on 3-14.  Appreciate Dr Abbey Chatters help. Pathology pending.  Acute renal insufficiency. Probably prerenal. Hold HCTZ, lisinopril. IV fluids. Vancomycin  level elevated. Renal function improved.  PT Consult for evaluation      LOS: 4 days   Kelleen Stolze M.D.  [facility] 09/05/2011, 2:46 PM  Neuropathy: will add gabapentin.

## 2011-09-06 LAB — CBC
HCT: 31.4 % — ABNORMAL LOW (ref 36.0–46.0)
MCHC: 32.5 g/dL (ref 30.0–36.0)
Platelets: 221 10*3/uL (ref 150–400)
RDW: 14.6 % (ref 11.5–15.5)
WBC: 9.6 10*3/uL (ref 4.0–10.5)

## 2011-09-06 LAB — BASIC METABOLIC PANEL
BUN: 13 mg/dL (ref 6–23)
Calcium: 8.6 mg/dL (ref 8.4–10.5)
Chloride: 106 mEq/L (ref 96–112)
Creatinine, Ser: 0.98 mg/dL (ref 0.50–1.10)
GFR calc Af Amer: 70 mL/min — ABNORMAL LOW (ref >90)
GFR calc non Af Amer: 60 mL/min — ABNORMAL LOW (ref >90)

## 2011-09-06 MED ORDER — ENOXAPARIN SODIUM 100 MG/ML ~~LOC~~ SOLN
90.0000 mg | SUBCUTANEOUS | Status: DC
  Administered 2011-09-07 – 2011-09-08 (×2): 90 mg via SUBCUTANEOUS
  Filled 2011-09-06 (×2): qty 1

## 2011-09-06 NOTE — Progress Notes (Signed)
2123-pt refused CPAP for the night, RT to monitor and assess as needed

## 2011-09-06 NOTE — Progress Notes (Signed)
Pharmacy: Lovenox for VTE px  Lovenox adjusted for BMI > 30 0.5 mg/kg x 182 kg = 90 mg sq q24h CBC stable Renal function improved, now with CrCl 97 ml/min  Shanitra Phillippi, Loma Messing PharmD 1:35 PM 09/06/2011

## 2011-09-06 NOTE — Progress Notes (Signed)
Subjective: Patient feeling better. Redness decreases.  Objective: Filed Vitals:   09/05/11 1427 09/05/11 2105 09/05/11 2259 09/06/11 0455  BP: 122/64 134/81  126/70  Pulse: 62 65 65 74  Temp: 98.1 F (36.7 C) 98.4 F (36.9 C)  98.5 F (36.9 C)  TempSrc: Oral Oral  Oral  Resp: 20 18 18 18   Height:      Weight:      SpO2: 94% 96% 90% 94%   Weight change:   Intake/Output Summary (Last 24 hours) at 09/06/11 1638 Last data filed at 09/06/11 0900  Gross per 24 hour  Intake    860 ml  Output      0 ml  Net    860 ml    General: Alert, awake, oriented x3, in no acute distress.  HEENT: No bruits, no goiter.  Heart: Regular rate and rhythm, without murmurs, rubs, gallops.  Lungs: CTA, bilateral air movement.  Abdomen: Soft, nontender, nondistended, positive bowel sounds.  Neuro: Grossly intact, nonfocal. Extremities; right arm redness has decreases.   Lab Results:  [facility] 09/06/11 0435 09/05/11 1620  NA 141 139  K 3.5 3.6  CL 106 103  CO2 26 26  GLUCOSE 104* 120*  BUN 13 14  CREATININE 0.98 1.06  CALCIUM 8.6 9.0  MG -- --  PHOS -- --    Basename 09/06/11 0435 09/04/11 0413  WBC 9.6 12.6*  NEUTROABS -- --  HGB 10.2* 10.9*  HCT 31.4* 33.0*  MCV 94.0 93.8  PLT 221 207    Micro Results: Recent Results (from the past 240 hour(s))  CULTURE, BLOOD (ROUTINE X 2)     Status: Normal (Preliminary result)   Collection Time   09/02/11  2:36 AM      Component Value Range Status Comment   Specimen Description BLOOD LEFT HAND   Final    Special Requests BOTTLES DRAWN AEROBIC ONLY 3CC   Final    Culture  Setup Time [id number]   Final    Culture     Final    Value:        BLOOD CULTURE RECEIVED NO GROWTH TO DATE CULTURE WILL BE HELD FOR 5 DAYS BEFORE ISSUING A FINAL NEGATIVE REPORT   Report Status PENDING   Incomplete   CULTURE, BLOOD (ROUTINE X 2)     Status: Normal (Preliminary result)   Collection Time   09/02/11  2:45 AM      Component Value Range Status Comment     Specimen Description BLOOD RIGHT WRIST   Final    Special Requests BOTTLES DRAWN AEROBIC AND ANAEROBIC 4CC   Final    Culture  Setup Time [id number]   Final    Culture     Final    Value:        BLOOD CULTURE RECEIVED NO GROWTH TO DATE CULTURE WILL BE HELD FOR 5 DAYS BEFORE ISSUING A FINAL NEGATIVE REPORT   Report Status PENDING   Incomplete   WOUND CULTURE     Status: Normal   Collection Time   09/02/11 10:39 AM      Component Value Range Status Comment   Specimen Description BREAST   Final    Special Requests NONE   Final    Gram Stain     Final    Value: NO WBC SEEN     NO SQUAMOUS EPITHELIAL CELLS SEEN     NO ORGANISMS SEEN   Culture     Final    Value: MODERATE  GROUP B STREP(S.AGALACTIAE)ISOLATED     Note: TESTING AGAINST S. AGALACTIAE NOT ROUTINELY PERFORMED DUE TO PREDICTABILITY OF AMP/PEN/VAN SUSCEPTIBILITY.   Report Status 09/04/2011 FINAL   Final     Studies/Results: No results found.  Medications: I have reviewed the patient's current medications.  Cellulitis of arm, right (07/01/2011  Received Vancomycin and Zosyn for 2 days.  Open wound from breast probably source of infection.  Wound culture growth strept. Unasyn day 3.  WBC trending down.  Will continue to monitor. Will consider Augmentin when going home.     BMI 70 and over, adult (07/01/2011)  HTN (hypertension) (07/01/2011) Continue with bystolic. Hold lisinopril and HCTZ due to worsening renal function. bmet pending,   Open wound of right breast, eroding calcifications  General surgery consulted. Patient status debridement and removal of calcification on 3-14.  Appreciate Dr Abbey Chatters help. Pathology pending.  Acute renal insufficiency. Probably prerenal. Hold HCTZ, lisinopril. IV fluids. Vancomycin level elevated. Renal function improved. Resolved. NSL.   PT Consult for evaluation      LOS: 5 days   Kerolos Nehme M.D.  [facility] 09/06/2011, 4:38 PM

## 2011-09-07 ENCOUNTER — Inpatient Hospital Stay (HOSPITAL_COMMUNITY): Payer: Medicare Other

## 2011-09-07 LAB — CBC
Hemoglobin: 9.9 g/dL — ABNORMAL LOW (ref 12.0–15.0)
Platelets: 213 10*3/uL (ref 150–400)
RBC: 3.27 MIL/uL — ABNORMAL LOW (ref 3.87–5.11)
WBC: 9.6 10*3/uL (ref 4.0–10.5)

## 2011-09-07 LAB — BASIC METABOLIC PANEL
CO2: 26 mEq/L (ref 19–32)
Calcium: 8.8 mg/dL (ref 8.4–10.5)
Chloride: 108 mEq/L (ref 96–112)
Glucose, Bld: 98 mg/dL (ref 70–99)
Potassium: 3.1 mEq/L — ABNORMAL LOW (ref 3.5–5.1)
Sodium: 141 mEq/L (ref 135–145)

## 2011-09-07 MED ORDER — AMOXICILLIN-POT CLAVULANATE 875-125 MG PO TABS
1.0000 | ORAL_TABLET | Freq: Two times a day (BID) | ORAL | Status: DC
  Administered 2011-09-07 – 2011-09-08 (×3): 1 via ORAL
  Filled 2011-09-07 (×4): qty 1

## 2011-09-07 MED ORDER — POTASSIUM CHLORIDE CRYS ER 20 MEQ PO TBCR
40.0000 meq | EXTENDED_RELEASE_TABLET | Freq: Once | ORAL | Status: AC
  Administered 2011-09-07: 40 meq via ORAL
  Filled 2011-09-07: qty 2

## 2011-09-07 MED ORDER — ALBUTEROL SULFATE HFA 108 (90 BASE) MCG/ACT IN AERS
1.0000 | INHALATION_SPRAY | Freq: Four times a day (QID) | RESPIRATORY_TRACT | Status: DC
  Administered 2011-09-07 – 2011-09-08 (×3): 1 via RESPIRATORY_TRACT
  Filled 2011-09-07: qty 6.7

## 2011-09-07 MED ORDER — FUROSEMIDE 10 MG/ML IJ SOLN
40.0000 mg | Freq: Once | INTRAMUSCULAR | Status: AC
  Administered 2011-09-07: 40 mg via INTRAVENOUS
  Filled 2011-09-07: qty 4

## 2011-09-07 MED FILL — Cefazolin in D5W Inj 1 GM/50ML: INTRAVENOUS | Qty: 100 | Status: AC

## 2011-09-07 NOTE — Progress Notes (Signed)
Subjective: Patient had some SOB on exertion, desat on ambulation.   Objective: Filed Vitals:   09/06/11 0455 09/06/11 1415 09/06/11 2035 09/07/11 0540  BP: 126/70 154/76 143/77 143/78  Pulse: 74 68 67 64  Temp: 98.5 F (36.9 C) 99.1 F (37.3 C) 99.5 F (37.5 C) 97.7 F (36.5 C)  TempSrc: Oral Oral Oral Oral  Resp: 18 20 19 18   Height:      Weight:      SpO2: 94% 90% 98% 97%   Weight change:   Intake/Output Summary (Last 24 hours) at 09/07/11 1355 Last data filed at 09/07/11 1124  Gross per 24 hour  Intake   1380 ml  Output      2 ml  Net   1378 ml    General: Alert, awake, oriented x3, in no acute distress.  HEENT: No bruits, no goiter.  Heart: Regular rate and rhythm, without murmurs, rubs, gallops.  Lungs: no wheezes, bilateral air movement.  Abdomen: Soft, nontender, nondistended, positive bowel sounds.  Neuro: Grossly intact, nonfocal. Extremities: trace edema.   Lab Results:  [facility] 09/07/11 0635 09/06/11 0435  NA 141 141  K 3.1* 3.5  CL 108 106  CO2 26 26  GLUCOSE 98 104*  BUN 11 13  CREATININE 0.93 0.98  CALCIUM 8.8 8.6  MG -- --  PHOS -- --    Basename 09/07/11 0635 09/06/11 0435  WBC 9.6 9.6  NEUTROABS -- --  HGB 9.9* 10.2*  HCT 31.1* 31.4*  MCV 95.1 94.0  PLT 213 221    Micro Results: Recent Results (from the past 240 hour(s))  CULTURE, BLOOD (ROUTINE X 2)     Status: Normal (Preliminary result)   Collection Time   09/02/11  2:36 AM      Component Value Range Status Comment   Specimen Description BLOOD LEFT HAND   Final    Special Requests BOTTLES DRAWN AEROBIC ONLY 3CC   Final    Culture  Setup Time [id number]   Final    Culture     Final    Value:        BLOOD CULTURE RECEIVED NO GROWTH TO DATE CULTURE WILL BE HELD FOR 5 DAYS BEFORE ISSUING A FINAL NEGATIVE REPORT   Report Status PENDING   Incomplete   CULTURE, BLOOD (ROUTINE X 2)     Status: Normal (Preliminary result)   Collection Time   09/02/11  2:45 AM      Component  Value Range Status Comment   Specimen Description BLOOD RIGHT WRIST   Final    Special Requests BOTTLES DRAWN AEROBIC AND ANAEROBIC 4CC   Final    Culture  Setup Time [id number]   Final    Culture     Final    Value:        BLOOD CULTURE RECEIVED NO GROWTH TO DATE CULTURE WILL BE HELD FOR 5 DAYS BEFORE ISSUING A FINAL NEGATIVE REPORT   Report Status PENDING   Incomplete   WOUND CULTURE     Status: Normal   Collection Time   09/02/11 10:39 AM      Component Value Range Status Comment   Specimen Description BREAST   Final    Special Requests NONE   Final    Gram Stain     Final    Value: NO WBC SEEN     NO SQUAMOUS EPITHELIAL CELLS SEEN     NO ORGANISMS SEEN   Culture     Final  Value: MODERATE GROUP B STREP(S.AGALACTIAE)ISOLATED     Note: TESTING AGAINST S. AGALACTIAE NOT ROUTINELY PERFORMED DUE TO PREDICTABILITY OF AMP/PEN/VAN SUSCEPTIBILITY.   Report Status 09/04/2011 FINAL   Final     Studies/Results: No results found.  Medications: I have reviewed the patient's current medications.  Hypoxemia, ambulation: I will check chest xray depending result will consider lasix or restart HCTZ. I will order albuterol treatments. Incentive spirometry at bedside.   Cellulitis of arm, right (07/01/2011  Received Vancomycin and Zosyn for 2 days.  Open wound from breast probably source of infection.  Wound culture growth strept. Received Unasyn day 4.  WBC normal. I will start Augmentin.   Hypokalemia: will replace with 40 meq po times one.   BMI 70 and over, adult (07/01/2011)  HTN (hypertension) (07/01/2011) Continue with bystolic. Will consider restart HCTZ.   Open wound of right breast, eroding calcifications  General surgery consulted. Patient status debridement and removal of calcification on 3-14.  Appreciate Dr Abbey Chatters help. Pathology pending.   Acute renal insufficiency. Probably prerenal. Hold  lisinopril.  Vancomycin level was elevated. Renal function improved. Resolved.  NSL.   If respiratory status better and if ok with surgery will plan for discharge tomorrow.      LOS: 6 days   Lashone Stauber M.D.  [facility] 09/07/2011, 1:55 PM

## 2011-09-07 NOTE — Progress Notes (Signed)
Physical Therapy Treatment Patient Details Name: Denis Koppel MRN: [id number] DOB: [birth year] Today's Date: 09/07/2011  PT Assessment/Plan  PT - Assessment/Plan Comments on Treatment Session: Assessed pt's possible need for home use of O2.  Pt desated to low 80's each time she ambulation with cues for pursed lip breathing.  Pt also states that she is congested.  However, until pts endurance is improved, PT recommends O2 home use for pt at D/C. See sats at bottom note.  PT Plan: Discharge plan remains appropriate PT Frequency: Min 3X/week Follow Up Recommendations: Home health PT;Supervision - Intermittent Equipment Recommended: None recommended by OT PT Goals  Acute Rehab PT Goals PT Goal Formulation: With patient Time For Goal Achievement: 2 weeks Pt will go Sit to Stand: with modified independence PT Goal: Sit to Stand - Progress: Progressing toward goal Pt will go Stand to Sit: with modified independence PT Goal: Stand to Sit - Progress: Progressing toward goal Pt will Ambulate: 51 - 150 feet;with supervision;with least restrictive assistive device PT Goal: Ambulate - Progress: Progressing toward goal  PT Treatment Precautions/Restrictions  Precautions Precautions: Fall Required Braces or Orthoses: No Restrictions Weight Bearing Restrictions: No Mobility (including Balance) Bed Mobility Bed Mobility: No Transfers Transfers: Yes Sit to Stand: 5: Supervision;With upper extremity assist;With armrests;From chair/3-in-1 Sit to Stand Details (indicate cue type and reason): Supervision for safety with cues for hand placement.  (Performed x 3 to check O2 sats.) Stand to Sit: 5: Supervision;With armrests;With upper extremity assist;To chair/3-in-1 Stand to Sit Details: Supervision for safety with cues for hand placement.  (Performed x 3 to check O2 sats) Ambulation/Gait Ambulation/Gait: Yes Ambulation/Gait Assistance: 5: Supervision Ambulation/Gait Assistance Details (indicate cue  type and reason): Supervision for safety with cues for pursed lip breathing and upright posture.  Ambulation Distance (Feet): 20 Feet (20' x 3 reps) Assistive device: Rolling walker Gait Pattern: Step-through pattern;Decreased stride length;Trunk flexed;Shuffle Gait velocity: decreased    Exercise    End of Session PT - End of Session Equipment Utilized During Treatment: Other (comment) (2LO2, then 3 LO2) Activity Tolerance: Patient limited by fatigue Patient left: in chair;with call bell in reach Nurse Communication: Other (comment) (Notified RN that pt noted some blood in sputum this am. ) General Behavior During Session: [facility] for tasks performed Cognition: [facility] for tasks performed  Page, Meribeth Mattes 09/07/2011, 11:37 AM   SATURATION QUALIFICATIONS:  Patient Saturations on Room Air at Rest = 91%  Patient Saturations on Room Air while Ambulating = 83%  Patient Saturations on 2 Liters of oxygen while Ambulating = 83%  Patient saturation on 3 Liters of oxygen while Ambulating= 87%

## 2011-09-07 NOTE — Progress Notes (Signed)
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 Subjective: Right arm and breast wound are feeling much better  Objective: Vital signs in last 24 hours: Temp:  [97.7 F (36.5 C)-99.5 F (37.5 C)] 97.7 F (36.5 C) (03/18 0540) Pulse Rate:  [64-68] 64  (03/18 0540) Resp:  [18-20] 18  (03/18 0540) BP: (143-154)/(76-78) 143/78 mmHg (03/18 0540) SpO2:  [90 %-98 %] 97 % (03/18 0540) Last BM Date: 09/06/11  Intake/Output from previous day: 03/17 0701 - 03/18 0700 In: 1520 [P.O.:1520] Out: -  Intake/Output this shift: Total I/O In: 480 [P.O.:480] Out: 2 [Urine:1; Stool:1]  Right arm with minimal warmth and erythema starting in upper arm area and extending below elbow, but no pitting, no induration. Right breast superior wound packing removed and wound clean, but does tunnel medially several inches. No odor, no drainage on packing. Inferior track with minimal serous drainage without odor.   Lab Results:   [facility] 09/07/11 0635 09/06/11 0435  WBC 9.6 9.6  HGB 9.9* 10.2*  HCT 31.1* 31.4*  PLT 213 221   BMET  Basename 09/07/11 0635 09/06/11 0435  NA 141 141  K 3.1* 3.5  CL 108 106  CO2 26 26  GLUCOSE 98 104*  BUN 11 13  CREATININE 0.93 0.98  CALCIUM 8.8 8.6   PT/INR No results found for this basename: LABPROT:2,INR:2 in the last 72 hours ABG No results found for this basename: PHART:2,PCO2:2,PO2:2,HCO3:2 in the last 72 hours  Studies/Results: No results found.  Anti-infectives: Anti-infectives     Start     Dose/Rate Route Frequency Ordered Stop   09/04/11 1200   Ampicillin-Sulbactam (UNASYN) 3 g in sodium chloride 0.9 % 100 mL IVPB        3 g 100 mL/hr over 60 Minutes Intravenous Every 8 hours 09/04/11 1003     09/02/11 2226   ceFAZolin (ANCEF) IVPB 2 g/50 mL premix  Status:  Discontinued        2 g 100 mL/hr over 30 Minutes Intravenous 60 min pre-op 09/02/11 2226 09/03/11 1506   09/02/11 1400   piperacillin-tazobactam (ZOSYN) IVPB 3.375 g   Status:  Discontinued        3.375 g 12.5 mL/hr over 240 Minutes Intravenous 3 times per day 09/02/11 0700 09/04/11 0946   09/02/11 0800   vancomycin (VANCOCIN) 1,500 mg in sodium chloride 0.9 % 500 mL IVPB  Status:  Discontinued        1,500 mg 250 mL/hr over 120 Minutes Intravenous Every 12 hours 09/02/11 0700 09/03/11 2046   09/02/11 0415   vancomycin (VANCOCIN) IVPB 1000 mg/200 mL premix        1,000 mg 200 mL/hr over 60 Minutes Intravenous  Once 09/02/11 0404 09/02/11 0602   09/02/11 0415   piperacillin-tazobactam (ZOSYN) 3.375 g in dextrose 5 % 50 mL IVPB        3.375 g 100 mL/hr over 30 Minutes Intravenous  Once 09/02/11 0404 09/02/11 0458          Assessment/Plan: s/p Procedure(s) (LRB): 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 ?candidate for VAC dressing to facilitate healing.  Continue antibiotic Rx per primary service  LOS: 6 days    Naoko Diperna,PA-C Pager [phone] General Trauma Pager [phone]

## 2011-09-08 ENCOUNTER — Other Ambulatory Visit: Payer: Self-pay | Admitting: Internal Medicine

## 2011-09-08 LAB — CULTURE, BLOOD (ROUTINE X 2)
Culture  Setup Time: 201303130821
Culture: NO GROWTH

## 2011-09-08 LAB — BASIC METABOLIC PANEL
BUN: 10 mg/dL (ref 6–23)
Chloride: 110 mEq/L (ref 96–112)
GFR calc non Af Amer: >90 mL/min (ref >90)
Glucose, Bld: 88 mg/dL (ref 70–99)
Potassium: 3.3 mEq/L — ABNORMAL LOW (ref 3.5–5.1)
Sodium: 144 mEq/L (ref 135–145)

## 2011-09-08 MED ORDER — POTASSIUM CHLORIDE CRYS ER 20 MEQ PO TBCR: 20.0000 meq | EXTENDED_RELEASE_TABLET | Freq: Once | ORAL | Status: DC

## 2011-09-08 MED ORDER — HYDROCHLOROTHIAZIDE 25 MG PO TABS: 25.0000 mg | ORAL_TABLET | Freq: Every day | ORAL | Status: DC

## 2011-09-08 MED ORDER — POTASSIUM CHLORIDE CRYS ER 20 MEQ PO TBCR
40.0000 meq | EXTENDED_RELEASE_TABLET | Freq: Once | ORAL | Status: DC
  Filled 2011-09-08: qty 2

## 2011-09-08 MED ORDER — ALBUTEROL SULFATE HFA 108 (90 BASE) MCG/ACT IN AERS: 1.0000 | INHALATION_SPRAY | Freq: Four times a day (QID) | RESPIRATORY_TRACT | Status: DC

## 2011-09-08 MED ORDER — AMOXICILLIN-POT CLAVULANATE 875-125 MG PO TABS: 1.0000 | ORAL_TABLET | Freq: Two times a day (BID) | ORAL | Status: AC

## 2011-09-08 MED ORDER — NYSTATIN 100000 UNIT/GM EX POWD: 100000.0000 [IU] | Freq: Two times a day (BID) | CUTANEOUS | Status: DC

## 2011-09-08 NOTE — Progress Notes (Signed)
RN assessed Pt's  Oxygen saturation levels, Pt is 98% on 2L oxygen and 96 % on RM air

## 2011-09-08 NOTE — Progress Notes (Signed)
Patient ID: [name], female   DOB: 03/26/1951, 61 y.o.   MRN: [id number] [facility] Progress Note:   5 Days Post-Op  Subjective: Mental status is clear Objective: Vital signs in last 24 hours: Temp:  [98.2 F (36.8 C)-99.1 F (37.3 C)] 98.3 F (36.8 C) (03/19 0500) Pulse Rate:  [64-68] 64  (03/19 0500) Resp:  [16-18] 18  (03/19 0500) BP: (117-159)/(67-82) 117/67 mmHg (03/19 0500) SpO2:  [92 %-97 %] 96 % (03/19 0500)  Intake/Output from previous day: 03/18 0701 - 03/19 0700 In: 1120 [P.O.:1020; IV Piggyback:100] Out: 1402 [Urine:1401; Stool:1] Intake/Output this shift:    Physical Exam: Work of breathing is  Normal.  Obese lady with dry changes in right arm of resolving cellulitis.  No abscess or surgical problems identified.   Lab Results:  Results for orders placed during the hospital encounter of 09/01/11 (from the past 48 hour(s))  CBC     Status: Abnormal   Collection Time   09/07/11  6:35 AM      Component Value Range Comment   WBC 9.6  4.0 - 10.5 (K/uL)    RBC 3.27 (*) 3.87 - 5.11 (MIL/uL)    Hemoglobin 9.9 (*) 12.0 - 15.0 (g/dL)    HCT 78.2 (*) 95.6 - 46.0 (%)    MCV 95.1  78.0 - 100.0 (fL)    MCH 30.3  26.0 - 34.0 (pg)    MCHC 31.8  30.0 - 36.0 (g/dL)    RDW 21.3  08.6 - 57.8 (%)    Platelets 213  150 - 400 (K/uL)   BASIC METABOLIC PANEL     Status: Abnormal   Collection Time   09/07/11  6:35 AM      Component Value Range Comment   Sodium 141  135 - 145 (mEq/L)    Potassium 3.1 (*) 3.5 - 5.1 (mEq/L)    Chloride 108  96 - 112 (mEq/L)    CO2 26  19 - 32 (mEq/L)    Glucose, Bld 98  70 - 99 (mg/dL)    BUN 11  6 - 23 (mg/dL)    Creatinine, Ser 4.69  0.50 - 1.10 (mg/dL)    Calcium 8.8  8.4 - 10.5 (mg/dL)    GFR calc non Af Amer 64 (*) >90 (mL/min)    GFR calc Af Amer 74 (*) >90 (mL/min)   BASIC METABOLIC PANEL     Status: Abnormal   Collection Time   09/08/11  3:46 AM      Component Value Range Comment   Sodium 144  135 - 145 (mEq/L)    Potassium 3.3 (*) 3.5 - 5.1 (mEq/L)    Chloride 110  96 - 112 (mEq/L)    CO2 27  19 - 32 (mEq/L)    Glucose, Bld 88  70 - 99 (mg/dL)    BUN 10  6 - 23 (mg/dL)    Creatinine, Ser 6.29  0.50 - 1.10 (mg/dL)    Calcium 8.8  8.4 - 10.5 (mg/dL)    GFR calc non Af Amer >90  >90 (mL/min)    GFR calc Af Amer >90  >90 (mL/min)     Radiology/Results: Dg Chest 2 View  09/07/2011  *RADIOLOGY REPORT*  Clinical Data: Hypoxemia, wheezing  CHEST - 2 VIEW  Comparison: 07/01/2011  Findings: Cardiomegaly with vascular congestion and suspected mild interstitial edema.  Right lower lobe opacity is favored to reflect asymmetric edema, but underlying pneumonia is not entirely excluded.  No pleural effusion or  pneumothorax.  Degenerative changes of the visualized thoracolumbar spine.  Surgical clips in the right axilla.  IMPRESSION: Cardiomegaly with suspected mild interstitial edema.  Right lower lobe opacity, favored to reflect asymmetric edema, underlying pneumonia not excluded.  Original Report Authenticated By: Charline Bills, M.D.    Anti-infectives: Anti-infectives     Start     Dose/Rate Route Frequency Ordered Stop   09/07/11 1400   amoxicillin-clavulanate (AUGMENTIN) 875-125 MG per tablet 1 tablet        1 tablet Oral Every 12 hours 09/07/11 1246     09/04/11 1200   Ampicillin-Sulbactam (UNASYN) 3 g in sodium chloride 0.9 % 100 mL IVPB  Status:  Discontinued        3 g 100 mL/hr over 60 Minutes Intravenous Every 8 hours 09/04/11 1003 09/07/11 1245   09/02/11 2226   ceFAZolin (ANCEF) IVPB 2 g/50 mL premix  Status:  Discontinued        2 g 100 mL/hr over 30 Minutes Intravenous 60 min pre-op 09/02/11 2226 09/03/11 1506   09/02/11 1400   piperacillin-tazobactam (ZOSYN) IVPB 3.375 g  Status:  Discontinued        3.375 g 12.5 mL/hr over 240 Minutes Intravenous 3 times per day 09/02/11 0700 09/04/11 0946   09/02/11 0800   vancomycin (VANCOCIN) 1,500 mg in sodium chloride 0.9 % 500 mL IVPB  Status:   Discontinued        1,500 mg 250 mL/hr over 120 Minutes Intravenous Every 12 hours 09/02/11 0700 09/03/11 2046   09/02/11 0415   vancomycin (VANCOCIN) IVPB 1000 mg/200 mL premix        1,000 mg 200 mL/hr over 60 Minutes Intravenous  Once 09/02/11 0404 09/02/11 0602   09/02/11 0415  piperacillin-tazobactam (ZOSYN) 3.375 g in dextrose 5 % 50 mL IVPB       3.375 g 100 mL/hr over 30 Minutes Intravenous  Once 09/02/11 0404 09/02/11 0458          Assessment/Plan: Problem List: Patient Active Problem List  Diagnoses  . Cellulitis of arm, right  . Cellulitis of breast  . BMI 70 and over, adult  . HTN (hypertension)  . Breast calcifications  . Venous stasis dermatitis  . Open wound of right breast, eroding calcifications  . Acute lymphangitis of right upper extremity    Surgery will sign off 5 Days Post-Op    LOS: 7 days   Matt B. Daphine Deutscher, MD, [facility] [phone] beeper [phone]  09/08/2011 9:11 AM

## 2011-09-08 NOTE — Progress Notes (Signed)
Talked to patient about DCP/ HHC - patient requested [facility] for HHRN/ PT and home oxygen. Message left with Norberta Keens RN with [facility] called for arrangements. Abelino Derrick RN, BSN, [employer]

## 2011-09-08 NOTE — Progress Notes (Signed)
Pt, D/C home with O2. Pt is stable , denies pain. D/C instructions done, medication instructions done. Pt verbalizes understanding.

## 2011-09-08 NOTE — Discharge Summary (Signed)
Admit date: 09/01/2011 Discharge date: 09/08/2011  Primary Care Physician:  Alva Garnet., MD, MD   Discharge Diagnoses:    . Acute Celluitis of right upper extremity Cellulitis right breast 07/04/2011   . Open wound of right breast, eroding calcifications 07/04/2011    Pulmonary edema, resolved. Acute renal failure, Prerenal, resolved. 07/01/2011   . Breast calcifications 07/01/2011   . BMI 70 and over, adult 07/01/2011   . HTN (hypertension) 07/01/2011              DISCHARGE MEDICATION: Medication List  As of 09/08/2011 11:00 AM   STOP taking these medications         calcium-vitamin D 500-200 MG-UNIT per tablet      ibuprofen 200 MG tablet      lisinopril-hydrochlorothiazide 20-25 MG per tablet         TAKE these medications         albuterol 108 (90 BASE) MCG/ACT inhaler   Commonly known as: PROVENTIL HFA;VENTOLIN HFA   Inhale 1 puff into the lungs 4 (four) times daily.      amoxicillin-clavulanate 875-125 MG per tablet   Commonly known as: AUGMENTIN   Take 1 tablet by mouth every 12 (twelve) hours.      aspirin 325 MG tablet   Take 325 mg by mouth daily.      diclofenac sodium 1 % Gel   Commonly known as: VOLTAREN   Apply 1 application topically 4 (four) times daily.      hydrochlorothiazide 25 MG tablet   Commonly known as: HYDRODIURIL   Take 1 tablet (25 mg total) by mouth daily.      nebivolol 10 MG tablet   Commonly known as: BYSTOLIC   Take 10 mg by mouth daily.      nystatin 100000 UNIT/GM Powd   Apply 1 g (100,000 Units total) topically 2 (two) times daily.      potassium chloride SA 20 MEQ tablet   Commonly known as: K-DUR,KLOR-CON   Take 1 tablet (20 mEq total) by mouth once.              Consults:  Surgery   SIGNIFICANT DIAGNOSTIC STUDIES:  Dg Chest 2 View  09/07/2011  *RADIOLOGY REPORT*  Clinical Data: Hypoxemia, wheezing  CHEST - 2 VIEW  Comparison: 07/01/2011  Findings: Cardiomegaly with vascular congestion and  suspected mild interstitial edema.  Right lower lobe opacity is favored to reflect asymmetric edema, but underlying pneumonia is not entirely excluded.  No pleural effusion or pneumothorax.  Degenerative changes of the visualized thoracolumbar spine.  Surgical clips in the right axilla.  IMPRESSION: Cardiomegaly with suspected mild interstitial edema.  Right lower lobe opacity, favored to reflect asymmetric edema, underlying pneumonia not excluded.  Original Report Authenticated By: Charline Bills, M.D.   Dg Humerus Right  09/02/2011  *RADIOLOGY REPORT*  Clinical Data: Right arm cellulitis  RIGHT HUMERUS - 2+ VIEW  Comparison: 09/03/2010 clavicle and shoulderradiographs  Findings:  Degenerative changes of the shoulder are partially imaged. No acute fracture or dislocation.  No aggressive osseous lesion.  There is a oval-shaped calcific density within the soft tissues of the arm posteriorly, nonspecific however of nonaggressive appearance.  IMPRESSION: No acute osseous abnormality identified.  Original Report Authenticated By: Waneta Martins, M.D.     Recent Results (from the past 240 hour(s))  CULTURE, BLOOD (ROUTINE X 2)     Status: Normal   Collection Time   09/02/11  2:36 AM  Component Value Range Status Comment   Specimen Description BLOOD LEFT HAND   Final    Special Requests BOTTLES DRAWN AEROBIC ONLY [facility]   Final    Culture  Setup Time [id number]   Final    Culture NO GROWTH 5 DAYS   Final    Report Status 09/08/2011 FINAL   Final   CULTURE, BLOOD (ROUTINE X 2)     Status: Normal   Collection Time   09/02/11  2:45 AM      Component Value Range Status Comment   Specimen Description BLOOD RIGHT WRIST   Final    Special Requests BOTTLES DRAWN AEROBIC AND ANAEROBIC 4CC   Final    Culture  Setup Time [id number]   Final    Culture NO GROWTH 5 DAYS   Final    Report Status 09/08/2011 FINAL   Final   WOUND CULTURE     Status: Normal   Collection Time   09/02/11 10:39 AM       Component Value Range Status Comment   Specimen Description BREAST   Final    Special Requests NONE   Final    Gram Stain     Final    Value: NO WBC SEEN     NO SQUAMOUS EPITHELIAL CELLS SEEN     NO ORGANISMS SEEN   Culture     Final    Value: MODERATE GROUP B STREP(S.AGALACTIAE)ISOLATED     Note: TESTING AGAINST S. AGALACTIAE NOT ROUTINELY PERFORMED DUE TO PREDICTABILITY OF AMP/PEN/VAN SUSCEPTIBILITY.   Report Status 09/04/2011 FINAL   Final     BRIEF ADMITTING H & P: This is a 61year-old female who is morbidly obese has recurrent cellulitis. Today at approximately 4 PM she developed significant chills. By 8: 30 p.m. she states her right arm was quite swollen, red and and painful. She had fevers and rigors. No nausea no vomiting. She came to the ER. She states she's also had sinus drainage. She has a history of recurring drainage from her breasts due to calcifications, she states is again open and broke out for the last few days and she has 2 open wounds on her breasts.  Hospital Course:  Hypoxemia, ambulation, Pulmonary Edema: Patient had chest x ray that showed cardiomegaly with mild interstitial edema, also right lower lobe opacity might reflect asymmetric edema, underline PNA not excluded. Patient received 40 mg IV lasix with good respond. She denies dyspnea. She was continue on Augmentin that should cover for PNA. She received  albuterol treatments. Incentive spirometry at bedside.   Cellulitis of arm, right (07/01/2011  Received Vancomycin and Zosyn for 2 days.  Open wound from breast probably source of infection.  Wound culture growth strept. Received Unasyn for 4 days.  WBC normal. Patient was started on Augmentin. Redness and swelling has decreases.   Hypokalemia: will replace with 40 meq po times one.   BMI 70 and over, adult (07/01/2011)  HTN (hypertension) (07/01/2011) Continue with bystolic. Will restart HCTZ.   Open wound of right breast, eroding calcifications  General  surgery consulted. Patient status debridement and removal of calcification on 3-14. Appreciate Dr Abbey Chatters help. Pathology pending. Patient will need to follow up with surgery. Acute renal insufficiency. Probably prerenal. Hold lisinopril. Vancomycin level was elevated. Renal function improved. Resolved with IV fluids. NSL.     Disposition and Follow-up:  Discharge Orders    Future Orders Please Complete By Expires   Diet - low sodium heart healthy  Increase activity slowly        Follow-up Information    Follow up with ROSENBOWER,TODD J, MD in 1 week.   Contact information:   [employer] [address] [place] [address] [phone]           DISCHARGE EXAM:  General: Alert, awake, oriented x3, in no acute distress.  HEENT: No bruits, no goiter.  Heart: Regular rate and rhythm, without murmurs, rubs, gallops.  Lungs: no wheezes, bilateral air movement.  Abdomen: Soft, nontender, nondistended, positive bowel sounds.  Neuro: Grossly intact, nonfocal.  Extremities: trace edema, Right arm with decreases redness and swelling.  Chest: right breast with clean dressing.    Blood pressure 117/67, pulse 64, temperature 98.3 F (36.8 C), temperature source Oral, resp. rate 18, height 5\' 3"  (1.6 m), weight 182.8 kg (403 lb), SpO2 96.00%.   Basename 09/08/11 0346 09/07/11 0635  NA 144 141  K 3.3* 3.1*  CL 110 108  CO2 27 26  GLUCOSE 88 98  BUN 10 11  CREATININE 0.69 0.93  CALCIUM 8.8 8.8  MG -- --  PHOS -- --   No results found for this basename: AST:2,ALT:2,ALKPHOS:2,BILITOT:2,PROT:2,ALBUMIN:2 in the last 72 hours No results found for this basename: LIPASE:2,AMYLASE:2 in the last 72 hours  Basename 09/07/11 0635 09/06/11 0435  WBC 9.6 9.6  NEUTROABS -- --  HGB 9.9* 10.2*  HCT 31.1* 31.4*  MCV 95.1 94.0  PLT 213 221    Signed: Eddye Broxterman M.D. 09/08/2011, 11:00 AM

## 2011-09-09 LAB — STONE ANALYSIS: Stone Weight KSTONE: 0.231 g

## 2011-09-16 ENCOUNTER — Encounter (HOSPITAL_COMMUNITY): Payer: Self-pay | Admitting: General Surgery

## 2011-10-07 ENCOUNTER — Telehealth (INDEPENDENT_AMBULATORY_CARE_PROVIDER_SITE_OTHER): Payer: Self-pay

## 2011-10-07 NOTE — Telephone Encounter (Signed)
[facility] nurse calling for extension of wound care and verbal order for social work consult.  Dr. Abbey Chatters approved 2 wks for wound care, and for social work consult.  Will contact the patient to schedule her for a post op appointment.

## 2011-10-21 ENCOUNTER — Telehealth (INDEPENDENT_AMBULATORY_CARE_PROVIDER_SITE_OTHER): Payer: Self-pay

## 2011-10-21 ENCOUNTER — Telehealth (INDEPENDENT_AMBULATORY_CARE_PROVIDER_SITE_OTHER): Payer: Self-pay | Admitting: General Surgery

## 2011-10-21 HISTORY — PX: BREAST SURGERY: SHX581

## 2011-10-21 NOTE — Telephone Encounter (Signed)
Returned phone call and LMOV.

## 2011-10-21 NOTE — Telephone Encounter (Signed)
Christy, with [facility], calling to obtain orders to continue seeing pt and and following wound care.  VO from Dr. Abbey Chatters for same given to her.

## 2011-11-26 ENCOUNTER — Encounter (INDEPENDENT_AMBULATORY_CARE_PROVIDER_SITE_OTHER): Payer: Medicare Other | Admitting: General Surgery

## 2012-01-04 ENCOUNTER — Telehealth (INDEPENDENT_AMBULATORY_CARE_PROVIDER_SITE_OTHER): Payer: Self-pay | Admitting: General Surgery

## 2012-01-04 NOTE — Telephone Encounter (Signed)
Neysa Bonito, nurse with [facility], calling to request recertification for home health to perform wound care.  She reports the wound is not as deep but the same size for length and breadth.  Pt's PCP has request to process need for transportation, so she doesn't have a way in to see Dr. Abbey Chatters for follow-up exam of wound.  He understands this and is willing to renew the orders for [facility] to continue wound care.  Christy aware and will FAX recert paperwork.

## 2012-06-03 ENCOUNTER — Encounter (HOSPITAL_COMMUNITY): Payer: Self-pay | Admitting: Internal Medicine

## 2012-06-03 ENCOUNTER — Inpatient Hospital Stay (HOSPITAL_COMMUNITY)
Admission: EM | Admit: 2012-06-03 | Discharge: 2012-06-08 | DRG: 872 | Disposition: A | Discharge status: Home Health | Payer: Medicare Other | Attending: Internal Medicine | Admitting: Internal Medicine

## 2012-06-03 DIAGNOSIS — Z6841 Body Mass Index (BMI) 40.0 and over, adult: Secondary | ICD-10-CM

## 2012-06-03 DIAGNOSIS — Z79899 Other long term (current) drug therapy: Secondary | ICD-10-CM

## 2012-06-03 DIAGNOSIS — I480 Paroxysmal atrial fibrillation: Secondary | ICD-10-CM | POA: Diagnosis present

## 2012-06-03 DIAGNOSIS — F411 Generalized anxiety disorder: Secondary | ICD-10-CM | POA: Diagnosis present

## 2012-06-03 DIAGNOSIS — A419 Sepsis, unspecified organism: Principal | ICD-10-CM | POA: Diagnosis present

## 2012-06-03 DIAGNOSIS — L02419 Cutaneous abscess of limb, unspecified: Secondary | ICD-10-CM | POA: Diagnosis present

## 2012-06-03 DIAGNOSIS — I4891 Unspecified atrial fibrillation: Secondary | ICD-10-CM | POA: Diagnosis present

## 2012-06-03 DIAGNOSIS — E872 Acidosis, unspecified: Secondary | ICD-10-CM | POA: Diagnosis present

## 2012-06-03 DIAGNOSIS — Z87891 Personal history of nicotine dependence: Secondary | ICD-10-CM

## 2012-06-03 DIAGNOSIS — L03119 Cellulitis of unspecified part of limb: Secondary | ICD-10-CM | POA: Diagnosis present

## 2012-06-03 DIAGNOSIS — F3289 Other specified depressive episodes: Secondary | ICD-10-CM | POA: Diagnosis present

## 2012-06-03 DIAGNOSIS — G4733 Obstructive sleep apnea (adult) (pediatric): Secondary | ICD-10-CM | POA: Diagnosis present

## 2012-06-03 DIAGNOSIS — Z9989 Dependence on other enabling machines and devices: Secondary | ICD-10-CM | POA: Diagnosis present

## 2012-06-03 DIAGNOSIS — I10 Essential (primary) hypertension: Secondary | ICD-10-CM | POA: Diagnosis present

## 2012-06-03 DIAGNOSIS — F329 Major depressive disorder, single episode, unspecified: Secondary | ICD-10-CM | POA: Diagnosis present

## 2012-06-03 HISTORY — DX: Cardiac arrhythmia, unspecified: I49.9

## 2012-06-03 HISTORY — DX: Dependence on other enabling machines and devices: Z99.89

## 2012-06-03 HISTORY — DX: Paroxysmal atrial fibrillation: I48.0

## 2012-06-03 HISTORY — DX: Obstructive sleep apnea (adult) (pediatric): G47.33

## 2012-06-03 LAB — CBC WITH DIFFERENTIAL/PLATELET
Basophils Absolute: 0 10*3/uL (ref 0.0–0.1)
Eosinophils Absolute: 0.2 10*3/uL (ref 0.0–0.7)
Eosinophils Relative: 1 % (ref 0–5)
MCH: 30 pg (ref 26.0–34.0)
MCV: 91.8 fL (ref 78.0–100.0)
Platelets: 315 10*3/uL (ref 150–400)
RDW: 14.2 % (ref 11.5–15.5)

## 2012-06-03 LAB — LACTIC ACID, PLASMA: Lactic Acid, Venous: 2.4 mmol/L — ABNORMAL HIGH (ref 0.5–2.2)

## 2012-06-03 LAB — BASIC METABOLIC PANEL
Calcium: 9 mg/dL (ref 8.4–10.5)
GFR calc non Af Amer: 69 mL/min — ABNORMAL LOW (ref >90)
Sodium: 135 mEq/L (ref 135–145)

## 2012-06-03 MED ORDER — SODIUM CHLORIDE 0.9 % IV SOLN
INTRAVENOUS | Status: DC
  Administered 2012-06-03: via INTRAVENOUS

## 2012-06-03 MED ORDER — CLINDAMYCIN PHOSPHATE 600 MG/50ML IV SOLN
600.0000 mg | Freq: Once | INTRAVENOUS | Status: AC
  Administered 2012-06-03: 600 mg via INTRAVENOUS
  Filled 2012-06-03: qty 50

## 2012-06-03 MED ORDER — HYDROMORPHONE HCL PF 1 MG/ML IJ SOLN
1.0000 mg | INTRAMUSCULAR | Status: DC | PRN
  Administered 2012-06-03: 1 mg via INTRAVENOUS
  Administered 2012-06-04: 2 mg via INTRAVENOUS
  Administered 2012-06-04 – 2012-06-05 (×3): 1 mg via INTRAVENOUS
  Administered 2012-06-05: 2 mg via INTRAVENOUS
  Administered 2012-06-05: 1 mg via INTRAVENOUS
  Administered 2012-06-06 – 2012-06-08 (×5): 2 mg via INTRAVENOUS
  Filled 2012-06-03: qty 1
  Filled 2012-06-03: qty 2
  Filled 2012-06-03: qty 1
  Filled 2012-06-03 (×3): qty 2
  Filled 2012-06-03 (×2): qty 1
  Filled 2012-06-03 (×6): qty 2

## 2012-06-03 MED ORDER — ACETAMINOPHEN 325 MG PO TABS
650.0000 mg | ORAL_TABLET | Freq: Four times a day (QID) | ORAL | Status: DC | PRN
  Administered 2012-06-04 – 2012-06-06 (×3): 650 mg via ORAL
  Filled 2012-06-03 (×2): qty 2

## 2012-06-03 MED ORDER — ONDANSETRON HCL 4 MG/2ML IJ SOLN: 4.0000 mg | Freq: Four times a day (QID) | INTRAMUSCULAR | Status: DC | PRN

## 2012-06-03 MED ORDER — SODIUM CHLORIDE 0.9 % IV SOLN
INTRAVENOUS | Status: DC
  Administered 2012-06-03 – 2012-06-04 (×2): via INTRAVENOUS

## 2012-06-03 MED ORDER — ACETAMINOPHEN 650 MG RE SUPP: 650.0000 mg | Freq: Four times a day (QID) | RECTAL | Status: DC | PRN

## 2012-06-03 MED ORDER — METOPROLOL TARTRATE 1 MG/ML IV SOLN
5.0000 mg | Freq: Three times a day (TID) | INTRAVENOUS | Status: DC | PRN
  Administered 2012-06-04: 5 mg via INTRAVENOUS
  Filled 2012-06-03: qty 5

## 2012-06-03 MED ORDER — HYDROMORPHONE HCL PF 1 MG/ML IJ SOLN
1.0000 mg | Freq: Once | INTRAMUSCULAR | Status: AC
  Administered 2012-06-03: 1 mg via INTRAVENOUS
  Filled 2012-06-03: qty 1

## 2012-06-03 MED ORDER — SODIUM CHLORIDE 0.9 % IV SOLN
1.5000 g | Freq: Four times a day (QID) | INTRAVENOUS | Status: DC
  Administered 2012-06-03 – 2012-06-07 (×14): 1.5 g via INTRAVENOUS
  Filled 2012-06-03 (×15): qty 1.5

## 2012-06-03 MED ORDER — SODIUM CHLORIDE 0.9 % IV BOLUS (SEPSIS)
1000.0000 mL | Freq: Once | INTRAVENOUS | Status: AC
  Administered 2012-06-03: 1000 mL via INTRAVENOUS

## 2012-06-03 MED ORDER — VANCOMYCIN HCL 10 G IV SOLR
1500.0000 mg | Freq: Two times a day (BID) | INTRAVENOUS | Status: DC
  Administered 2012-06-03 – 2012-06-05 (×4): 1500 mg via INTRAVENOUS
  Filled 2012-06-03 (×6): qty 1500

## 2012-06-03 MED ORDER — SODIUM CHLORIDE 0.9 % IV BOLUS (SEPSIS)
500.0000 mL | Freq: Once | INTRAVENOUS | Status: AC
  Administered 2012-06-03: 500 mL via INTRAVENOUS

## 2012-06-03 MED ORDER — HEPARIN SODIUM (PORCINE) 5000 UNIT/ML IJ SOLN
5000.0000 [IU] | Freq: Three times a day (TID) | INTRAMUSCULAR | Status: DC
  Administered 2012-06-03 – 2012-06-04 (×2): 5000 [IU] via SUBCUTANEOUS
  Filled 2012-06-03 (×5): qty 1

## 2012-06-03 MED ORDER — ONDANSETRON HCL 4 MG PO TABS: 4.0000 mg | ORAL_TABLET | Freq: Four times a day (QID) | ORAL | Status: DC | PRN

## 2012-06-03 MED ORDER — SODIUM CHLORIDE 0.9 % IJ SOLN
3.0000 mL | Freq: Two times a day (BID) | INTRAMUSCULAR | Status: DC
  Administered 2012-06-05 – 2012-06-06 (×2): 3 mL via INTRAVENOUS

## 2012-06-03 MED ORDER — POLYETHYLENE GLYCOL 3350 17 G PO PACK
17.0000 g | PACK | Freq: Every day | ORAL | Status: DC | PRN
  Filled 2012-06-03: qty 1

## 2012-06-03 MED ORDER — ZOLPIDEM TARTRATE 5 MG PO TABS
5.0000 mg | ORAL_TABLET | Freq: Every evening | ORAL | Status: DC | PRN
  Administered 2012-06-04 – 2012-06-08 (×2): 5 mg via ORAL
  Filled 2012-06-03 (×2): qty 1

## 2012-06-03 NOTE — ED Provider Notes (Signed)
History     CSN: [id number]  Arrival date & time 06/03/12  1618   First MD Initiated Contact with Patient 06/03/12 1642      Chief Complaint  Patient presents with  . Cellulitis    (Consider location/radiation/quality/duration/timing/severity/associated sxs/prior treatment) The history is provided by the patient.   patient here with bilateral lower extremity erythema with multiple open wounds. 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 Today her nurse noted that she's had worsening erythema of her bilateral lower extremities. No vomiting or diarrhea but some fever subjectively by the patient. Has been admitted to the hospital for similar symptoms in the past  Past Medical History  Diagnosis Date  . Obesities, morbid   . Hypertension   . Cellulitis     right breast, right arm, ble  . Depression   . Anxiety   . 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 Obstructive sleep apnea     CPAP  . Breast cancer 1988    Past Surgical History  Procedure Date  . Right lumpectomy 1988  . Abdominal hysterectomy 1991  . Right carpal tunnel release 1995  . Axillary lymph node dissection 1988  . Wound debridement 09/03/2011    Procedure: DEBRIDEMENT WOUND;  Surgeon: Adolph Pollack, MD;  Location: [facility];  Service: General;  Laterality: Right;    Family History  Problem Relation Age of Onset  . Hypertension      History  Substance Use Topics  . Smoking status: Former Smoker    Types: Cigarettes    Quit date: 07/01/2004  . Smokeless tobacco: Never Used  . Alcohol Use: No    OB History    Grav Para Term Preterm Abortions TAB SAB Ect Mult Living                  Review of Systems  All other systems reviewed and are negative.    Allergies  Effexor  Home Medications   Current Outpatient Rx  Name  Route  Sig   Dispense  Refill  . ALBUTEROL SULFATE HFA 108 (90 BASE) MCG/ACT IN AERS   Inhalation   Inhale 1 puff into the lungs 4 (four) times daily.   1 Inhaler   0   . ASPIRIN 325 MG PO TABS   Oral   Take 325 mg by mouth daily.         Marland Kitchen DICLOFENAC SODIUM 1 % TD GEL   Topical   Apply 1 application topically 4 (four) times daily.         Marland Kitchen HYDROCHLOROTHIAZIDE 25 MG PO TABS   Oral   Take 1 tablet (25 mg total) by mouth daily.   30 tablet   0   . NEBIVOLOL HCL 10 MG PO TABS   Oral   Take 10 mg by mouth daily.         . NYSTATIN 100000 UNIT/GM EX POWD   Topical   Apply 1 g (100,000 Units total) topically 2 (two) times daily.   15 g   0   . POTASSIUM CHLORIDE CRYS ER 20 MEQ PO TBCR   Oral   Take 1 tablet (20 mEq total) by mouth once.   10 tablet   0     BP 131/59  Pulse 133  Temp  98.7 F (37.1 C) (Oral)  Resp 18  SpO2 99%  Physical Exam  Nursing note and vitals reviewed. Constitutional: She is oriented to person, place, and time. She appears well-developed and well-nourished.  Non-toxic appearance. No distress.  HENT:  Head: Normocephalic and atraumatic.  Eyes: Conjunctivae normal, EOM and lids are normal. Pupils are equal, round, and reactive to light.  Neck: Normal range of motion. Neck supple. No tracheal deviation present. No mass present.  Cardiovascular: Regular rhythm and normal heart sounds.  Tachycardia present.  Exam reveals no gallop.   No murmur heard. Pulmonary/Chest: Effort normal and breath sounds normal. No stridor. No respiratory distress. She has no decreased breath sounds. She has no wheezes. She has no rhonchi. She has no rales.  Abdominal: Soft. Normal appearance and bowel sounds are normal. She exhibits no distension. There is no tenderness. There is no rebound and no CVA tenderness.  Musculoskeletal: Normal range of motion. She exhibits no edema and no tenderness.       Bilateral lower extremity erythema and edema with multiple areas of venous  stasis ulcer. Skin is warm to touch and without crepitus  Neurological: She is alert and oriented to person, place, and time. She has normal strength. No cranial nerve deficit or sensory deficit. GCS eye subscore is 4. GCS verbal subscore is 5. GCS motor subscore is 6.  Skin: Skin is warm and dry. No abrasion and no rash noted.  Psychiatric: She has a normal mood and affect. Her speech is normal and behavior is normal.    ED Course  Procedures (including critical care time)  Labs Reviewed - No data to display No results found.   No diagnosis found.    MDM  Patient placed on clindamycin IV for his cellulitis. Will be admitted by the hospitalist    Date: 06/03/2012  Rate: 110  Rhythm: atrial fibrillation  QRS Axis: normal  Intervals: normal  ST/T Wave abnormalities: normal  Conduction Disutrbances:none  Narrative Interpretation:   Old EKG Reviewed: none available         Toy Baker, MD 06/03/12 1820

## 2012-06-03 NOTE — ED Notes (Addendum)
Pt from home, lives w/son.  Has had cellulitis to bilateral legs since March this year.  Has HHRN's come by to care for her, but the legs are getting worse.  EMS reports greenish drainage w/foul odor.  Pt is ambulatory w/ assistance or walker at home.  Pt was sent here by her PMD.  VS: 136/72, 80.

## 2012-06-03 NOTE — H&P (Signed)
[facility] History and Physical  [name] JXB:[id number] DOB: 01-09-1951 DOA: 06/03/2012  Referring physician: Dr. Freida Busman PCP: Alva Garnet., MD  Specialists: none  Chief Complaint: lower extremity redness  HPI: [name] is a 61 y.o. female  Past medical history of hypertension, morbid obesity, and history of atrial fibrillation not on Coumadin that comes in for lower extremity redness erythema swelling and purulent drainage of lower extremities bilaterally. She says she was doing fine until 2 days ago when she noticed the redness. [facility] and [facility] this morning and was told to come to the ED. Here in the ED she was found to have an elevated white count with lactic 2.4. Her blood pressure has remained stable.  Review of Systems: The patient denies anorexia, fever, weight loss,, vision loss, decreased hearing, hoarseness, chest pain, syncope, dyspnea on exertion, peripheral edema, balance deficits, hemoptysis, abdominal pain, melena, hematochezia, severe indigestion/heartburn, hematuria, incontinence, genital sores, muscle weakness, suspicious skin lesions, transient blindness, difficulty walking, depression, unusual weight change, abnormal bleeding, enlarged lymph nodes, angioedema, and breast masses.    Past Medical History  Diagnosis Date  . Obesities, morbid   . Hypertension   . Cellulitis     right breast, right arm, ble  . Depression   . Anxiety   . 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 Obstructive sleep apnea     CPAP  . Breast cancer 1988   Past Surgical History  Procedure Date  . Right lumpectomy 1988  . Abdominal hysterectomy 1991  . Right carpal tunnel release 1995  . Axillary lymph node dissection 1988  . Wound debridement 09/03/2011    Procedure: DEBRIDEMENT WOUND;  Surgeon: Adolph Pollack, MD;   Location: [facility];  Service: General;  Laterality: Right;   Social History:  reports that she quit smoking about 7 years ago. Her smoking use included Cigarettes. She has a 20 pack-year smoking history. She has never used smokeless tobacco. She reports that she does not drink alcohol or use illicit drugs. Is at home by herself can perform most of her ADLs Allergies  Allergen Reactions  . Effexor (Venlafaxine Hydrochloride) Anxiety    Made her want to kill herself    Family History  Problem Relation Age of Onset  . Hypertension    . Other Mother   . Other Father     Prior to Admission medications   Medication Sig Start Date End Date Taking? Authorizing Provider  albuterol (PROVENTIL HFA;VENTOLIN HFA) 108 (90 BASE) MCG/ACT inhaler Inhale 1 puff into the lungs 4 (four) times daily. 09/08/11 09/07/12 Yes Belkys A Regalado, MD  aspirin 325 MG tablet Take 325 mg by mouth daily.   Yes Historical Provider, MD  Cholecalciferol (VITAMIN D-3) 5000 UNITS TABS Take 1 capsule by mouth daily.   Yes Historical Provider, MD  citalopram (CELEXA) 10 MG tablet Take 10 mg by mouth daily.   Yes Historical Provider, MD  Cyanocobalamin (VITAMIN B 12 PO) Take 1,000 mcg by mouth daily.   Yes Historical Provider, MD  diclofenac sodium (VOLTAREN) 1 % GEL Apply 1 application topically 4 (four) times daily.   Yes Historical Provider, MD  lisinopril-hydrochlorothiazide (PRINZIDE,ZESTORETIC) 20-25 MG per tablet Take 1 tablet by mouth daily.   Yes Historical Provider, MD  nebivolol (BYSTOLIC) 10 MG tablet Take 10 mg by mouth  daily.   Yes Historical Provider, MD  oxyCODONE-acetaminophen (PERCOCET/ROXICET) 5-325 MG per tablet Take 1 tablet by mouth every 4 (four) hours as needed. Pain   Yes Historical Provider, MD  zinc gluconate 50 MG tablet Take 50 mg by mouth daily.   Yes Historical Provider, MD   Physical Exam: Filed Vitals:   06/03/12 1649 06/03/12 1738  BP: 131/59   Pulse: 133 114  Temp: 98.7 F (37.1 C)    TempSrc: Oral   Resp: 18   SpO2: 99%      General:  Morbidly obese female in no acute distress  Eyes: Anicteric no palmar  ENT: Dry mucous membranes  Neck: No JVD  Cardiovascular: Irregular rate and rhythm and distant heart sounds  Respiratory: Good air movement clear to auscultation  Abdomen: Positive bowel sounds nontender nondistended and soft  Skin: She has bilateral ulcerations of her lower extremities from her ankles and mid way to the tibia with purulent drainage and erythema.  Musculoskeletal: Intact  Psychiatric: Appropriate  Neurologic: Nonfocal  Labs on Admission:  Basic Metabolic Panel:  Lab 06/03/12 9604  NA 135  K 3.4*  CL 101  CO2 21  GLUCOSE 94  BUN 12  CREATININE 0.87  CALCIUM 9.0  MG --  PHOS --   Liver Function Tests: No results found for this basename: AST:5,ALT:5,ALKPHOS:5,BILITOT:5,PROT:5,ALBUMIN:5 in the last 168 hours No results found for this basename: LIPASE:5,AMYLASE:5 in the last 168 hours No results found for this basename: AMMONIA:5 in the last 168 hours CBC:  Lab 06/03/12 1715  WBC 13.0*  NEUTROABS 9.6*  HGB 10.6*  HCT 32.4*  MCV 91.8  PLT 315   Cardiac Enzymes: No results found for this basename: CKTOTAL:5,CKMB:5,CKMBINDEX:5,TROPONINI:5 in the last 168 hours  BNP (last 3 results) No results found for this basename: PROBNP:3 in the last 8760 hours CBG: No results found for this basename: GLUCAP:5 in the last 168 hours  Radiological Exams on Admission: No results found.  EKG: A. fib with mild RVR, right axis deviation.  Assessment/Plan Principal Problem: Sepsis/ Lactic acidosis - Is most likely secondary to her lower extremity cellulitis. We'll go ahead and get blood cultures x2. Limited to a telemetry unit to keep close eye on vital signs. Also give her a bolus of normal saline. Ch a be cellulitis for further details.  Lower extremity cellulitis: -We'll go ahead and admitted to the telemetry unit, vancomycin  and Unasyn 06/03/2012, 12.13.2013 blood cultures x2, . Will get wound care to see her. She does have mild lactic acidosis. We'll start her on IV fluids.  A. fib with RVR: - She is on bisoprolol at home, she's currently well controlled at home and asymtomatic. Her cardiologist is Dr. Nanetta Batty. Her CHADS  greater than 2. I will go ahead and start her on metoprolol IV every 8 hours when necessary for her recurrent and 110. We'll give Dr. Nanetta Batty a call in The morning to see if he would want to start her on anticoagulation.  BMI 70 and over, adult - Counseling get a PT consult.  HTN (hypertension) - well controlled contiue current treatment.   Code Status: full Family Communication: none Disposition Plan: TBD 2-3 days Time spent: 60 minutes  Marinda Elk [facility] Pager [phone]  If 7PM-7AM, please contact night-coverage [email or web address] Password [facility] 06/03/2012, 6:55 PM

## 2012-06-03 NOTE — Progress Notes (Signed)
ANTIBIOTIC CONSULT NOTE - INITIAL  Pharmacy Consult for Vancomcyin Indication: Cellulitis  Allergies  Allergen Reactions  . Effexor (Venlafaxine Hydrochloride) Anxiety    Made her want to kill herself    Patient Measurements:   Adjusted Body Weight:   Vital Signs: Temp: 98.7 F (37.1 C) (12/13 1649) Temp src: Oral (12/13 1649) BP: 131/59 mmHg (12/13 1649) Pulse Rate: 114  (12/13 1738) Intake/Output from previous day:   Intake/Output from this shift:    Labs:  Basename 06/03/12 1715  WBC 13.0*  HGB 10.6*  PLT 315  LABCREA --  CREATININE 0.87   The CrCl is unknown because both a height and weight (above a minimum accepted value) are required for this calculation. No results found for this basename: VANCOTROUGH:2,VANCOPEAK:2,VANCORANDOM:2,GENTTROUGH:2,GENTPEAK:2,GENTRANDOM:2,TOBRATROUGH:2,TOBRAPEAK:2,TOBRARND:2,AMIKACINPEAK:2,AMIKACINTROU:2,AMIKACIN:2, in the last 72 hours   Microbiology: No results found for this or any previous visit (from the past 720 hour(s)).  Medical History: Past Medical History  Diagnosis Date  . Obesities, morbid   . Hypertension   . Cellulitis     right breast, right arm, ble  . Depression   . Anxiety   . 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 Obstructive sleep apnea     CPAP  . Breast cancer 1988    Medications:  Clindamycin 600mg  IV x 1 in ED (dose not charted yet)  Assessment: 40 yoF presents with bilateral LE cellulitis.  PMHx pertinent for chronic cellulitis since 08/2011 and obesity.  Pt [facility].  Pharmacy asked to manage vancomycin therapy.  Per MD note, pt also to start Unasyn, though it has not been started yet.    BMI 71.5  Afebrile  WBC elevated @ 13.0  Renal fxn: CrCl >100, SCr 0.87  Lactic acid elevated 2.4  Cultures: none drawn yet  Goal of Therapy:  Vancomycin trough level 10-15  mcg/ml  Plan:  1.  Vancomycin 1500mg  q 12 h per nomogram 2.  Follow-up renal fxn, cultures, T, WBC, clinical course, vancomycin trough at steady state  Gaby Harney E 06/03/2012,7:17 PM

## 2012-06-03 NOTE — ED Notes (Signed)
Bed:WA12<BR> Expected date:<BR> Expected time:<BR> Means of arrival:<BR> Comments:<BR> EMS

## 2012-06-04 ENCOUNTER — Encounter (HOSPITAL_COMMUNITY): Payer: Self-pay | Admitting: Cardiology

## 2012-06-04 DIAGNOSIS — I480 Paroxysmal atrial fibrillation: Secondary | ICD-10-CM

## 2012-06-04 DIAGNOSIS — G4733 Obstructive sleep apnea (adult) (pediatric): Secondary | ICD-10-CM

## 2012-06-04 DIAGNOSIS — Z6841 Body Mass Index (BMI) 40.0 and over, adult: Secondary | ICD-10-CM

## 2012-06-04 HISTORY — DX: Paroxysmal atrial fibrillation: I48.0

## 2012-06-04 HISTORY — DX: Obstructive sleep apnea (adult) (pediatric): G47.33

## 2012-06-04 LAB — COMPREHENSIVE METABOLIC PANEL
BUN: 9 mg/dL (ref 6–23)
Calcium: 8.4 mg/dL (ref 8.4–10.5)
GFR calc Af Amer: 89 mL/min — ABNORMAL LOW (ref >90)
Glucose, Bld: 96 mg/dL (ref 70–99)
Total Protein: 6.4 g/dL (ref 6.0–8.3)

## 2012-06-04 LAB — CBC
HCT: 31 % — ABNORMAL LOW (ref 36.0–46.0)
Hemoglobin: 10 g/dL — ABNORMAL LOW (ref 12.0–15.0)
RBC: 3.36 MIL/uL — ABNORMAL LOW (ref 3.87–5.11)
WBC: 9.3 10*3/uL (ref 4.0–10.5)

## 2012-06-04 LAB — LACTIC ACID, PLASMA: Lactic Acid, Venous: 1.1 mmol/L (ref 0.5–2.2)

## 2012-06-04 LAB — HEMOGLOBIN A1C
Hgb A1c MFr Bld: 5.8 % — ABNORMAL HIGH (ref <5.7)
Mean Plasma Glucose: 120 mg/dL — ABNORMAL HIGH (ref <117)

## 2012-06-04 MED ORDER — LISINOPRIL 20 MG PO TABS
20.0000 mg | ORAL_TABLET | Freq: Every day | ORAL | Status: DC
  Administered 2012-06-04 – 2012-06-08 (×5): 20 mg via ORAL
  Filled 2012-06-04 (×5): qty 1

## 2012-06-04 MED ORDER — RIVAROXABAN 20 MG PO TABS
20.0000 mg | ORAL_TABLET | Freq: Every day | ORAL | Status: DC
  Administered 2012-06-04 – 2012-06-07 (×4): 20 mg via ORAL
  Filled 2012-06-04 (×5): qty 1

## 2012-06-04 MED ORDER — ALBUTEROL SULFATE HFA 108 (90 BASE) MCG/ACT IN AERS
1.0000 | INHALATION_SPRAY | Freq: Four times a day (QID) | RESPIRATORY_TRACT | Status: DC
Start: 2012-06-04 — End: 2012-06-05
  Filled 2012-06-04: qty 6.7

## 2012-06-04 MED ORDER — NEBIVOLOL HCL 10 MG PO TABS
10.0000 mg | ORAL_TABLET | Freq: Every day | ORAL | Status: DC
  Administered 2012-06-04 – 2012-06-07 (×4): 10 mg via ORAL
  Filled 2012-06-04 (×5): qty 1

## 2012-06-04 MED ORDER — CITALOPRAM HYDROBROMIDE 10 MG PO TABS
10.0000 mg | ORAL_TABLET | Freq: Every day | ORAL | Status: DC
  Administered 2012-06-04 – 2012-06-08 (×5): 10 mg via ORAL
  Filled 2012-06-04 (×5): qty 1

## 2012-06-04 NOTE — Progress Notes (Signed)
Utilization review completed.  

## 2012-06-04 NOTE — Consult Note (Signed)
Reason for Consult: PAF   Referring Physician:  [facility]  [name] is an 61 y.o. female.    Chief Complaint:  Admitted 06/03/12 for lower ext redness and found to be in afib.  HPI: a 61 y.o. Female with a past medical history of hypertension, morbid obesity, and history of P. atrial fibrillation not on Coumadin that came in (06/03/12) for lower extremity redness, erythema, swelling and purulent drainage of lower extremities bilaterally. She says she was doing fine until 3 days ago when she noticed the redness. She has advanced home care who evaluated her yesterday and she was instructed to come to the ED. In the ED she was found to have an elevated white count with lactic 2.4. Her blood pressure had remained stable.  Her EKG revealed a fib with HR 116. No acute EKG changes.  Last visit with Dr. Allyson Sabal 12/19/11.  2 D echo at that time revealed concentric LVH and mild Lt atrial enlargement.  No History of CAD. + Hx. Of HTN obesity and sleep apnea, followed by Dr. Tresa Endo.    Past Medical History  Diagnosis Date  . Obesities, morbid   . Hypertension   . Cellulitis     right breast, right arm, ble  . Depression   . Anxiety   . 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 Obstructive sleep apnea     CPAP  . Breast cancer 1988  . Dysrhythmia   . Obstructive sleep apnea on CPAP 06/04/2012  . PAF (paroxysmal atrial fibrillation) 06/04/2012    Past Surgical History  Procedure Date  . Right lumpectomy 1988  . Abdominal hysterectomy 1991  . Right carpal tunnel release 1995  . Axillary lymph node dissection 1988  . Wound debridement 09/03/2011    Procedure: DEBRIDEMENT WOUND;  Surgeon: Adolph Pollack, MD;  Location: [facility];  Service: General;  Laterality: Right;    Family History  Problem Relation Age of Onset  . Hypertension    . Other Mother   . Other Father    Social  History:  reports that she quit smoking about 7 years ago. Her smoking use included Cigarettes. She has a 20 pack-year smoking history. She has never used smokeless tobacco. She reports that she does not drink alcohol or use illicit drugs.  Allergies:  Allergies  Allergen Reactions  . Effexor (Venlafaxine Hydrochloride) Anxiety    Made her want to kill herself    Medications Prior to Admission  Medication Sig Dispense Refill  . albuterol (PROVENTIL HFA;VENTOLIN HFA) 108 (90 BASE) MCG/ACT inhaler Inhale 1 puff into the lungs 4 (four) times daily.  1 Inhaler  0  . aspirin 325 MG tablet Take 325 mg by mouth daily.      . Cholecalciferol (VITAMIN D-3) 5000 UNITS TABS Take 1 capsule by mouth daily.      . citalopram (CELEXA) 10 MG tablet Take 10 mg by mouth daily.      . Cyanocobalamin (VITAMIN B 12 PO) Take 1,000 mcg by mouth daily.      . diclofenac sodium (VOLTAREN) 1 % GEL Apply 1 application topically 4 (four) times daily.      [name] Kitchen lisinopril-hydrochlorothiazide (PRINZIDE,ZESTORETIC) 20-25 MG per tablet Take 1 tablet by mouth daily.      . nebivolol (BYSTOLIC) 10 MG tablet Take 10 mg by mouth daily.      [name] Kitchen  oxyCODONE-acetaminophen (PERCOCET/ROXICET) 5-325 MG per tablet Take 1 tablet by mouth every 4 (four) hours as needed. Pain      . zinc gluconate 50 MG tablet Take 50 mg by mouth daily.        Results for orders placed during the hospital encounter of 06/03/12 (from the past 48 hour(s))  CBC WITH DIFFERENTIAL     Status: Abnormal   Collection Time   06/03/12  5:15 PM      Component Value Range Comment   WBC 13.0 (*) 4.0 - 10.5 K/uL    RBC 3.53 (*) 3.87 - 5.11 MIL/uL    Hemoglobin 10.6 (*) 12.0 - 15.0 g/dL    HCT 16.1 (*) 09.6 - 46.0 %    MCV 91.8  78.0 - 100.0 fL    MCH 30.0  26.0 - 34.0 pg    MCHC 32.7  30.0 - 36.0 g/dL    RDW 04.5  40.9 - 81.1 %    Platelets 315  150 - 400 K/uL    Neutrophils Relative 74  43 - 77 %    Neutro Abs 9.6 (*) 1.7 - 7.7 K/uL    Lymphocytes Relative  17  12 - 46 %    Lymphs Abs 2.2  0.7 - 4.0 K/uL    Monocytes Relative 7  3 - 12 %    Monocytes Absolute 1.0  0.1 - 1.0 K/uL    Eosinophils Relative 1  0 - 5 %    Eosinophils Absolute 0.2  0.0 - 0.7 K/uL    Basophils Relative 0  0 - 1 %    Basophils Absolute 0.0  0.0 - 0.1 K/uL   BASIC METABOLIC PANEL     Status: Abnormal   Collection Time   06/03/12  5:15 PM      Component Value Range Comment   Sodium 135  135 - 145 mEq/L    Potassium 3.4 (*) 3.5 - 5.1 mEq/L    Chloride 101  96 - 112 mEq/L    CO2 21  19 - 32 mEq/L    Glucose, Bld 94  70 - 99 mg/dL    BUN 12  6 - 23 mg/dL    Creatinine, Ser 9.14  0.50 - 1.10 mg/dL    Calcium 9.0  8.4 - 78.2 mg/dL    GFR calc non Af Amer 69 (*) >90 mL/min    GFR calc Af Amer 80 (*) >90 mL/min   LACTIC ACID, PLASMA     Status: Abnormal   Collection Time   06/03/12  5:15 PM      Component Value Range Comment   Lactic Acid, Venous 2.4 (*) 0.5 - 2.2 mmol/L   COMPREHENSIVE METABOLIC PANEL     Status: Abnormal   Collection Time   06/04/12  5:34 AM      Component Value Range Comment   Sodium 134 (*) 135 - 145 mEq/L    Potassium 3.6  3.5 - 5.1 mEq/L    Chloride 103  96 - 112 mEq/L    CO2 23  19 - 32 mEq/L    Glucose, Bld 96  70 - 99 mg/dL    BUN 9  6 - 23 mg/dL    Creatinine, Ser 9.56  0.50 - 1.10 mg/dL    Calcium 8.4  8.4 - 21.3 mg/dL    Total Protein 6.4  6.0 - 8.3 g/dL    Albumin 2.1 (*) 3.5 - 5.2 g/dL    AST 8  0 -  37 U/L    ALT 5  0 - 35 U/L    Alkaline Phosphatase 87  39 - 117 U/L    Total Bilirubin 0.4  0.3 - 1.2 mg/dL    GFR calc non Af Amer 77 (*) >90 mL/min    GFR calc Af Amer 89 (*) >90 mL/min   CBC     Status: Abnormal   Collection Time   06/04/12  5:34 AM      Component Value Range Comment   WBC 9.3  4.0 - 10.5 K/uL    RBC 3.36 (*) 3.87 - 5.11 MIL/uL    Hemoglobin 10.0 (*) 12.0 - 15.0 g/dL    HCT 16.1 (*) 09.6 - 46.0 %    MCV 92.3  78.0 - 100.0 fL    MCH 29.8  26.0 - 34.0 pg    MCHC 32.3  30.0 - 36.0 g/dL    RDW 04.5  40.9  - 81.1 %    Platelets 272  150 - 400 K/uL    No results found.  ROS: General:no colds or fevers, no weight changes Skin:no rashes + cellulitis with drainage HEENT:no blurred vision, no congestion CV:see HPI PUL:see HPI GI:no diarrhea constipation or melena, no indigestion GU:no hematuria, no dysuria MS:no joint pain, no claudication Neuro:no syncope, no lightheadedness Endo:no diabetes, no thyroid disease   Blood pressure 105/43, pulse 124, temperature 99.8 F (37.7 C), temperature source Oral, resp. rate 20, height 5\' 3"  (1.6 m), weight 170.6 kg (376 lb 1.7 oz), SpO2 96.00%. PE: General:alert and oriented,pleasant affect Skin:warm and dry, except ulcerations on legs HEENT:normocephalic, sclera clear Neck:supple, non JVD, no bruits Heart:irreg irreg Lungs:clear without rales, rhonchi or wheezes BJY:NWGNF, soft, non tender, + BS AOZ:HYQMVHQION of lower extremeties,  Neuro:alert and oriented X 3, MAE, follows commands    Assessment/Plan Principal Problem:  *Lower extremity cellulitis Active Problems:  BMI 70 and over, adult  HTN (hypertension)  Sepsis  Lactic acidosis  Obstructive sleep apnea on CPAP  PAF (paroxysmal atrial fibrillation)  Plan:Pt with a history of PAF now on bystolic, in addition to lisinopril/hctz.  Currently in a. Fib.  CHADS2 score of 1 though with mildly abnormal Echo could be considered 2.  I am unsure of last echo date, may be as far back as 2004 which is in [facility].  Will obtain more complete records on Monday.   Pt is on ASA.  She would benefit from Xarelto or Pradaxa for a month- see Dr. Blanchie Dessert note.  Karan Ramnauth R 06/04/2012, 11:46 AM

## 2012-06-04 NOTE — Consult Note (Addendum)
Pt. Seen and examined. Agree with the NP/PA-C note as written. Unfortunate 61 yo female with super morbid obesity (BMI near 70) with a newly diagnosed cellulitis of the bilateral LE's. Exam notable for super morbid obesity, distant, irregular heart sounds and bilateral LE erythema and orangish drainage. History of p-afib with good rate control. She is unaware of her a-fib, which could be triggered by the infection. I'm concerned with immobility and cellulitis about venous stasis and hypercoagulability in addition to the a-fib. Would recommend stopping aspirin and SQ heparin and starting Xarelto 20 mg daily. This could be continued for 1 month and if she remains in a-fib at that time, we could consider cardioversion.   Chrystie Nose, MD, [facility] Attending Cardiologist [facility]

## 2012-06-04 NOTE — Progress Notes (Signed)
[facility] PROGRESS NOTE  [name] WUJ:[id number] DOB: 1950/07/24 DOA: 06/03/2012 PCP: Alva Garnet., MD  Assessment/Plan: Sepsis/ Lactic acidosis  - Is most likely secondary to her lower extremity cellulitis.  Lower extremity cellulitis:   vancomycin and Unasyn 1 started on 06/03/2012, . Wound care consult requested.  A. fib with RVR:  - She is on bisoprolol at home, resume bisoprolol. Her cardiologist is Dr. Nanetta Batty. Her CHADS greater than 2.she was started on xarelto today.  BMI 70 and over, adult  - Counseling get a PT consult.  HTN (hypertension)  - well controlled contiue current treatment  Code Status: full code. Family Communication: none at bedside Disposition Plan: pending   Consultants:  cardiology  Antibiotics:  Vancomycin   unasyn  HPI/Subjective: Comfortable, lying in bed  Objective: Filed Vitals:   06/04/12 0006 06/04/12 0243 06/04/12 0532 06/04/12 1349  BP:  111/54 105/43 122/64  Pulse: 124 117 124 117  Temp:  99.4 F (37.4 C) 99.8 F (37.7 C) 97.9 F (36.6 C)  TempSrc:  Oral Oral Oral  Resp:  20 20 20   Height:      Weight:   170.6 kg (376 lb 1.7 oz)   SpO2:  96% 96% 97%    Intake/Output Summary (Last 24 hours) at 06/04/12 1607 Last data filed at 06/04/12 1400  Gross per 24 hour  Intake   3600 ml  Output    800 ml  Net   2800 ml   Filed Weights   06/03/12 1900 06/03/12 2301 06/04/12 0532  Weight: 182.8 kg (403 lb) 169.5 kg (373 lb 10.9 oz) 170.6 kg (376 lb 1.7 oz)    Exam:   General:  Alert afebrile comfortable  Cardiovascular: s1s2 irregular, tachycardic.  Respiratory: DECREASED air entry at bases.   Abdomen: obese, soft NT ND BS+  Extremities: bilateral lower extremity cellulitis, draining   Data Reviewed: Basic Metabolic Panel:  Lab 06/04/12 9562 06/03/12 1715  NA 134* 135  K 3.6 3.4*  CL 103 101  CO2 23 21  GLUCOSE 96 94  BUN 9 12  CREATININE 0.80 0.87  CALCIUM 8.4 9.0  MG -- --   PHOS -- --   Liver Function Tests:  Lab 06/04/12 0534  AST 8  ALT 5  ALKPHOS 87  BILITOT 0.4  PROT 6.4  ALBUMIN 2.1*   No results found for this basename: LIPASE:5,AMYLASE:5 in the last 168 hours No results found for this basename: AMMONIA:5 in the last 168 hours CBC:  Lab 06/04/12 0534 06/03/12 1715  WBC 9.3 13.0*  NEUTROABS -- 9.6*  HGB 10.0* 10.6*  HCT 31.0* 32.4*  MCV 92.3 91.8  PLT 272 315   Cardiac Enzymes: No results found for this basename: CKTOTAL:5,CKMB:5,CKMBINDEX:5,TROPONINI:5 in the last 168 hours BNP (last 3 results) No results found for this basename: PROBNP:3 in the last 8760 hours CBG: No results found for this basename: GLUCAP:5 in the last 168 hours  No results found for this or any previous visit (from the past 240 hour(s)).   Studies: No results found.  Scheduled Meds:   . ampicillin-sulbactam (UNASYN) IV  1.5 g Intravenous Q6H  . lisinopril  20 mg Oral Daily  . rivaroxaban  20 mg Oral Q supper  . sodium chloride  3 mL Intravenous Q12H  . vancomycin  1,500 mg Intravenous Q12H   Continuous Infusions:   . sodium chloride 125 mL/hr at 06/04/12 1308    Principal Problem:  *Lower extremity cellulitis Active Problems:  BMI 70  and over, adult  HTN (hypertension)  Sepsis  Lactic acidosis  Obstructive sleep apnea on CPAP  PAF (paroxysmal atrial fibrillation)       [name]  [facility] Pager [phone]. If 8PM-8AM, please contact night-coverage at [email or web address], password [facility] 06/04/2012, 4:07 PM  LOS: 1 day

## 2012-06-05 LAB — BASIC METABOLIC PANEL
CO2: 22 mEq/L (ref 19–32)
Glucose, Bld: 100 mg/dL — ABNORMAL HIGH (ref 70–99)
Potassium: 3.8 mEq/L (ref 3.5–5.1)
Sodium: 136 mEq/L (ref 135–145)

## 2012-06-05 LAB — CBC
Hemoglobin: 10 g/dL — ABNORMAL LOW (ref 12.0–15.0)
MCH: 29.7 pg (ref 26.0–34.0)
RBC: 3.37 MIL/uL — ABNORMAL LOW (ref 3.87–5.11)

## 2012-06-05 LAB — VANCOMYCIN, TROUGH: Vancomycin Tr: 17.4 ug/mL (ref 10.0–20.0)

## 2012-06-05 MED ORDER — VANCOMYCIN HCL 10 G IV SOLR
1250.0000 mg | Freq: Two times a day (BID) | INTRAVENOUS | Status: DC
  Administered 2012-06-05 – 2012-06-07 (×5): 1250 mg via INTRAVENOUS
  Filled 2012-06-05 (×6): qty 1250

## 2012-06-05 MED ORDER — ALBUTEROL SULFATE HFA 108 (90 BASE) MCG/ACT IN AERS
1.0000 | INHALATION_SPRAY | RESPIRATORY_TRACT | Status: DC | PRN
  Filled 2012-06-05: qty 6.7

## 2012-06-05 NOTE — Progress Notes (Signed)
Utilization Review Completed.[name] T12/15/2013   

## 2012-06-05 NOTE — Progress Notes (Signed)
[facility] PROGRESS NOTE  [name] ZOX:[id number] DOB: 1950/08/28 DOA: 06/03/2012 PCP: Alva Garnet., MD  Assessment/Plan: Sepsis/ Lactic acidosis  - Is most likely secondary to her lower extremity cellulitis.  Lower extremity cellulitis:   vancomycin and Unasyn  started on 06/03/2012, . Wound care consult requested.  A. fib with RVR: rate controlled.  - She is on bisoprolol at home, resume bisoprolol. Her cardiologist is Dr. Nanetta Batty. Her CHADS greater than 2.she was started on xarelto .  BMI 70 and over, adult  - Counseling get a PT consult.  HTN (hypertension)  - well controlled contiue current treatment  Code Status: full code. Family Communication: none at bedside Disposition Plan: pending   Consultants:  cardiology  Antibiotics:  Vancomycin   unasyn  HPI/Subjective: Comfortable, lying in bed  Objective: Filed Vitals:   06/04/12 1349 06/04/12 2059 06/05/12 0328 06/05/12 1530  BP: 122/64 148/63 124/56 144/62  Pulse: 117 114 98 99  Temp: 97.9 F (36.6 C) 99.4 F (37.4 C) 98.8 F (37.1 C) 98.6 F (37 C)  TempSrc: Oral Oral Oral Oral  Resp: 20 18 18 20   Height:      Weight:   175.1 kg (386 lb 0.4 oz)   SpO2: 97% 96% 98% 93%    Intake/Output Summary (Last 24 hours) at 06/05/12 1940 Last data filed at 06/05/12 8119  Gross per 24 hour  Intake    660 ml  Output    325 ml  Net    335 ml   Filed Weights   06/03/12 2301 06/04/12 0532 06/05/12 0328  Weight: 169.5 kg (373 lb 10.9 oz) 170.6 kg (376 lb 1.7 oz) 175.1 kg (386 lb 0.4 oz)    Exam:   General:  Alert afebrile comfortable  Cardiovascular: s1s2 irregular, tachycardic.  Respiratory: DECREASED air entry at bases.   Abdomen: obese, soft NT ND BS+  Extremities: bilateral lower extremity cellulitis, draining   Data Reviewed: Basic Metabolic Panel:  Lab 06/05/12 1478 06/04/12 0534 06/03/12 1715  NA 136 134* 135  K 3.8 3.6 3.4*  CL 103 103 101  CO2 22 23 21   GLUCOSE  100* 96 94  BUN 10 9 12   CREATININE 0.84 0.80 0.87  CALCIUM 8.6 8.4 9.0  MG 2.0 -- --  PHOS -- -- --   Liver Function Tests:  Lab 06/04/12 0534  AST 8  ALT 5  ALKPHOS 87  BILITOT 0.4  PROT 6.4  ALBUMIN 2.1*   No results found for this basename: LIPASE:5,AMYLASE:5 in the last 168 hours No results found for this basename: AMMONIA:5 in the last 168 hours CBC:  Lab 06/05/12 0525 06/04/12 0534 06/03/12 1715  WBC 10.5 9.3 13.0*  NEUTROABS -- -- 9.6*  HGB 10.0* 10.0* 10.6*  HCT 31.2* 31.0* 32.4*  MCV 92.6 92.3 91.8  PLT 277 272 315   Cardiac Enzymes: No results found for this basename: CKTOTAL:5,CKMB:5,CKMBINDEX:5,TROPONINI:5 in the last 168 hours BNP (last 3 results) No results found for this basename: PROBNP:3 in the last 8760 hours CBG: No results found for this basename: GLUCAP:5 in the last 168 hours  Recent Results (from the past 240 hour(s))  CULTURE, BLOOD (ROUTINE X 2)     Status: Normal (Preliminary result)   Collection Time   06/04/12 12:50 AM      Component Value Range Status Comment   Specimen Description BLOOD RIGHT ARM   Final    Special Requests BOTTLES DRAWN AEROBIC AND ANAEROBIC 5CC BOTH   Final  Culture  Setup Time 06/04/2012 15:39   Final    Culture     Final    Value:        BLOOD CULTURE RECEIVED NO GROWTH TO DATE CULTURE WILL BE HELD FOR 5 DAYS BEFORE ISSUING A FINAL NEGATIVE REPORT   Report Status PENDING   Incomplete   CULTURE, BLOOD (ROUTINE X 2)     Status: Normal (Preliminary result)   Collection Time   06/04/12  1:15 AM      Component Value Range Status Comment   Specimen Description BLOOD RIGHT ARM   Final    Special Requests     Final    Value: BOTTLES DRAWN AEROBIC AND ANAEROBIC 3CC AEB, 2CC ANA   Culture  Setup Time 06/04/2012 15:39   Final    Culture     Final    Value:        BLOOD CULTURE RECEIVED NO GROWTH TO DATE CULTURE WILL BE HELD FOR 5 DAYS BEFORE ISSUING A FINAL NEGATIVE REPORT   Report Status PENDING   Incomplete       Studies: No results found.  Scheduled Meds:    . ampicillin-sulbactam (UNASYN) IV  1.5 g Intravenous Q6H  . citalopram  10 mg Oral Daily  . lisinopril  20 mg Oral Daily  . nebivolol  10 mg Oral Daily  . rivaroxaban  20 mg Oral Q supper  . sodium chloride  3 mL Intravenous Q12H  . vancomycin  1,500 mg Intravenous Q12H   Continuous Infusions:    . sodium chloride 125 mL/hr at 06/04/12 4540    Principal Problem:  *Lower extremity cellulitis Active Problems:  BMI 70 and over, adult  HTN (hypertension)  Sepsis  Lactic acidosis  Obstructive sleep apnea on CPAP  PAF (paroxysmal atrial fibrillation)       [name]  [facility] Pager [phone]. If 8PM-8AM, please contact night-coverage at [email or web address], password [facility] 06/05/2012, 7:40 PM  LOS: 2 days

## 2012-06-05 NOTE — Progress Notes (Addendum)
Clinical Social Work Department BRIEF PSYCHOSOCIAL ASSESSMENT 06/05/2012  Patient:  [name], [name]     Account Number:  [id number]     Admit date:  06/03/2012  Clinical Social Worker:  Leron Croak, CLINICAL SOCIAL WORKER  Date/Time:  06/05/2012 11:18 AM  Referred by:  Physician  Date Referred:  06/03/2012 Referred for  SNF Placement   Other Referral:   Interview type:  Patient Other interview type:       PSYCHOSOCIAL DATA Living Status:  ALONE Admitted from facility:   Level of care:   Primary support name:  [name] Primary support relationship to patient:  CHILD, ADULT Degree of support available:   Pt has good support from friends and family    CURRENT CONCERNS Current Concerns  Post-Acute Placement   Other Concerns:    SOCIAL WORK ASSESSMENT / PLAN CSW met with the Pt at the bedside. Pt stated that she was not aware of the referral and that she feels more comfortable at home with HHPT that in a SNF. Pt stated that she has neighbors and her son that can take care of her. Pt does not want the CSW to fax out information at this time and would like a referral to HHPT.    Assessment/plan status:  Information/Referral to [facility] Other assessment/ plan:   Information/referral to community resources:   CSW provided a SNF listing in case the Pt changes her mind or the Pt's medical status changes. CSW will also refer Pt to CM for assistance with HHPT.    PATIENT'S/FAMILY'S RESPONSE TO PLAN OF CARE: Pt and son were appreciative for assistance with d/c planning.      Leron Croak, LCSWA [employer] [phone]

## 2012-06-05 NOTE — Progress Notes (Signed)
Brief Pharmacy Update:    Vancomycin trough supratherapeutic at 17.4 (goal 10-15 for cellulitis).  SCr has remained stable.  CrCl >100 ml/min.  Current dose is Vancomycin 1500mg  IV q 12 hours.  Will reduce dose to Vancomycin 1250mg  IV q 12 hours and f/u level as appropriate.    Haynes Hoehn, PharmD 06/05/2012 8:23 PM  Pager: [phone]

## 2012-06-05 NOTE — Progress Notes (Signed)
[facility]  DAILY PROGRESS NOTE   Subjective:  WBC improving on antibiotics. Remains in rate-controlled a-fib. Now on xarelto.  Bystolic has been re-started as bp will now tolerate.  No chest pain.  Objective:  Temp:  [97.9 F (36.6 C)-99.4 F (37.4 C)] 98.8 F (37.1 C) (12/15 0328) Pulse Rate:  [98-117] 98  (12/15 0328) Resp:  [18-20] 18  (12/15 0328) BP: (122-148)/(56-64) 124/56 mmHg (12/15 0328) SpO2:  [96 %-98 %] 98 % (12/15 0328) Weight:  [175.1 kg (386 lb 0.4 oz)] 175.1 kg (386 lb 0.4 oz) (12/15 0328) Weight change: -7.7 kg (-16 lb 15.6 oz)  Intake/Output from previous day: 12/14 0701 - 12/15 0700 In: 2775 [P.O.:670; I.V.:455; IV Piggyback:1650] Out: 625 [Urine:625]  Intake/Output from this shift:    Medications: Current Facility-Administered Medications  Medication Dose Route Frequency Provider Last Rate Last Dose  . 0.9 %  sodium chloride infusion   Intravenous Continuous Toy Baker, MD 125 mL/hr at 06/04/12 [phone]    . acetaminophen (TYLENOL) tablet 650 mg  650 mg Oral Q6H PRN Marinda Elk, MD   650 mg at 06/04/12 1226   Or  . acetaminophen (TYLENOL) suppository 650 mg  650 mg Rectal Q6H PRN Marinda Elk, MD      . albuterol (PROVENTIL HFA;VENTOLIN HFA) 108 (90 BASE) MCG/ACT inhaler 1 puff  1 puff Inhalation Q4H PRN Kathlen Mody, MD      . ampicillin-sulbactam (UNASYN) 1.5 g in sodium chloride 0.9 % 50 mL IVPB  1.5 g Intravenous Q6H Marinda Elk, MD   1.5 g at 06/05/12 0608  . citalopram (CELEXA) tablet 10 mg  10 mg Oral Daily Kathlen Mody, MD   10 mg at 06/05/12 0924  . HYDROmorphone (DILAUDID) injection 1-2 mg  1-2 mg Intravenous Q4H PRN Rolan Lipa, NP   1 mg at 06/04/12 2033  . lisinopril (PRINIVIL,ZESTRIL) tablet 20 mg  20 mg Oral Daily Kathlen Mody, MD   20 mg at 06/05/12 0924  . metoprolol (LOPRESSOR) injection 5 mg  5 mg Intravenous Q8H PRN Marinda Elk, MD   5 mg at 06/04/12 0006  .  nebivolol (BYSTOLIC) tablet 10 mg  10 mg Oral Daily Kathlen Mody, MD   10 mg at 06/05/12 0924  . ondansetron (ZOFRAN) tablet 4 mg  4 mg Oral Q6H PRN Marinda Elk, MD       Or  . ondansetron [facility]) injection 4 mg  4 mg Intravenous Q6H PRN Marinda Elk, MD      . polyethylene glycol [facility] Hal) packet 17 g  17 g Oral Daily PRN Marinda Elk, MD      . Rivaroxaban Carlena Hurl) tablet 20 mg  20 mg Oral Q supper Chrystie Nose, MD   20 mg at 06/04/12 1636  . sodium chloride 0.9 % injection 3 mL  3 mL Intravenous Q12H Marinda Elk, MD      . vancomycin [facility]) 1,500 mg in sodium chloride 0.9 % 500 mL IVPB  1,500 mg Intravenous Q12H Colleen E Summe, PHARMD 250 mL/hr at 06/05/12 0759 1,500 mg at 06/05/12 0759  . zolpidem (AMBIEN) tablet 5 mg  5 mg Oral QHS PRN Marinda Elk, MD   5 mg at 06/04/12 0100    Physical Exam: General appearance: alert, no distress and super morbidly obese Neck: no adenopathy, no carotid bruit, JVP could not be assessed, supple Lungs: distant lung sounds Heart: distant heart sounds,  s1/s2 Abdomen: no tenderness to palpation, + BS Extremities: edema 2+ with erythema and weeping Pulses: 1+, difficult to palpate due to body habitus  Lab Results: Results for orders placed during the hospital encounter of 06/03/12 (from the past 48 hour(s))  CBC WITH DIFFERENTIAL     Status: Abnormal   Collection Time   06/03/12  5:15 PM      Component Value Range Comment   WBC 13.0 (*) 4.0 - 10.5 K/uL    RBC 3.53 (*) 3.87 - 5.11 MIL/uL    Hemoglobin 10.6 (*) 12.0 - 15.0 g/dL    HCT 16.1 (*) 09.6 - 46.0 %    MCV 91.8  78.0 - 100.0 fL    MCH 30.0  26.0 - 34.0 pg    MCHC 32.7  30.0 - 36.0 g/dL    RDW 04.5  40.9 - 81.1 %    Platelets 315  150 - 400 K/uL    Neutrophils Relative 74  43 - 77 %    Neutro Abs 9.6 (*) 1.7 - 7.7 K/uL    Lymphocytes Relative 17  12 - 46 %    Lymphs Abs 2.2  0.7 - 4.0 K/uL    Monocytes Relative 7  3 - 12 %     Monocytes Absolute 1.0  0.1 - 1.0 K/uL    Eosinophils Relative 1  0 - 5 %    Eosinophils Absolute 0.2  0.0 - 0.7 K/uL    Basophils Relative 0  0 - 1 %    Basophils Absolute 0.0  0.0 - 0.1 K/uL   BASIC METABOLIC PANEL     Status: Abnormal   Collection Time   06/03/12  5:15 PM      Component Value Range Comment   Sodium 135  135 - 145 mEq/L    Potassium 3.4 (*) 3.5 - 5.1 mEq/L    Chloride 101  96 - 112 mEq/L    CO2 21  19 - 32 mEq/L    Glucose, Bld 94  70 - 99 mg/dL    BUN 12  6 - 23 mg/dL    Creatinine, Ser 9.14  0.50 - 1.10 mg/dL    Calcium 9.0  8.4 - 78.2 mg/dL    GFR calc non Af Amer 69 (*) >90 mL/min    GFR calc Af Amer 80 (*) >90 mL/min   LACTIC ACID, PLASMA     Status: Abnormal   Collection Time   06/03/12  5:15 PM      Component Value Range Comment   Lactic Acid, Venous 2.4 (*) 0.5 - 2.2 mmol/L   TSH     Status: Normal   Collection Time   06/04/12 12:50 AM      Component Value Range Comment   TSH 2.259  0.350 - 4.500 uIU/mL   HEMOGLOBIN A1C     Status: Abnormal   Collection Time   06/04/12 12:50 AM      Component Value Range Comment   Hemoglobin A1C 5.8 (*) <5.7 %    Mean Plasma Glucose 120 (*) <117 mg/dL   COMPREHENSIVE METABOLIC PANEL     Status: Abnormal   Collection Time   06/04/12  5:34 AM      Component Value Range Comment   Sodium 134 (*) 135 - 145 mEq/L    Potassium 3.6  3.5 - 5.1 mEq/L    Chloride 103  96 - 112 mEq/L    CO2 23  19 - 32 mEq/L  Glucose, Bld 96  70 - 99 mg/dL    BUN 9  6 - 23 mg/dL    Creatinine, Ser 4.09  0.50 - 1.10 mg/dL    Calcium 8.4  8.4 - 81.1 mg/dL    Total Protein 6.4  6.0 - 8.3 g/dL    Albumin 2.1 (*) 3.5 - 5.2 g/dL    AST 8  0 - 37 U/L    ALT 5  0 - 35 U/L    Alkaline Phosphatase 87  39 - 117 U/L    Total Bilirubin 0.4  0.3 - 1.2 mg/dL    GFR calc non Af Amer 77 (*) >90 mL/min    GFR calc Af Amer 89 (*) >90 mL/min   CBC     Status: Abnormal   Collection Time   06/04/12  5:34 AM      Component Value Range Comment    WBC 9.3  4.0 - 10.5 K/uL    RBC 3.36 (*) 3.87 - 5.11 MIL/uL    Hemoglobin 10.0 (*) 12.0 - 15.0 g/dL    HCT 91.4 (*) 78.2 - 46.0 %    MCV 92.3  78.0 - 100.0 fL    MCH 29.8  26.0 - 34.0 pg    MCHC 32.3  30.0 - 36.0 g/dL    RDW 95.6  21.3 - 08.6 %    Platelets 272  150 - 400 K/uL   LACTIC ACID, PLASMA     Status: Normal   Collection Time   06/04/12  8:16 PM      Component Value Range Comment   Lactic Acid, Venous 1.1  0.5 - 2.2 mmol/L   CBC     Status: Abnormal   Collection Time   06/05/12  5:25 AM      Component Value Range Comment   WBC 10.5  4.0 - 10.5 K/uL    RBC 3.37 (*) 3.87 - 5.11 MIL/uL    Hemoglobin 10.0 (*) 12.0 - 15.0 g/dL    HCT 57.8 (*) 46.9 - 46.0 %    MCV 92.6  78.0 - 100.0 fL    MCH 29.7  26.0 - 34.0 pg    MCHC 32.1  30.0 - 36.0 g/dL    RDW 62.9  52.8 - 41.3 %    Platelets 277  150 - 400 K/uL   BASIC METABOLIC PANEL     Status: Abnormal   Collection Time   06/05/12  5:25 AM      Component Value Range Comment   Sodium 136  135 - 145 mEq/L    Potassium 3.8  3.5 - 5.1 mEq/L    Chloride 103  96 - 112 mEq/L    CO2 22  19 - 32 mEq/L    Glucose, Bld 100 (*) 70 - 99 mg/dL    BUN 10  6 - 23 mg/dL    Creatinine, Ser 2.44  0.50 - 1.10 mg/dL    Calcium 8.6  8.4 - 01.0 mg/dL    GFR calc non Af Amer 72 (*) >90 mL/min    GFR calc Af Amer 84 (*) >90 mL/min   MAGNESIUM     Status: Normal   Collection Time   06/05/12  5:25 AM      Component Value Range Comment   Magnesium 2.0  1.5 - 2.5 mg/dL     Imaging: No results found.  Assessment:  1. Principal Problem: 2.  *Lower extremity cellulitis 3. Active Problems: 4.  BMI 70 and over,  adult 5.  HTN (hypertension) 6.  Sepsis 7.  Lactic acidosis 8.  Obstructive sleep apnea on CPAP 9.  PAF (paroxysmal atrial fibrillation) 10.   Plan:  1. Continue xarelto.  Agree with ACE-I and bystolic. Would check LFT's at least once before discharge to make sure they are not elevated secondary to xarelto.  Plan follow-up with  Dr. Allyson Sabal in 4-6 weeks. If she is in a-fib at that time, she may need cardioversion.  Will sign-off.  Call with questions.  Time Spent Directly with Patient:  15 minutes  Length of Stay:  LOS: 2 days   Chrystie Nose, MD, [facility] Attending Cardiologist [facility]  Makyi Ledo C 06/05/2012, 9:45 AM

## 2012-06-06 NOTE — Progress Notes (Signed)
[facility] PROGRESS NOTE  [name] ZOX:[id number] DOB: Jan 19, 1951 DOA: 06/03/2012 PCP: Alva Garnet., MD  Assessment/Plan: Sepsis/ Lactic acidosis  - Is most likely secondary to her lower extremity cellulitis.  Lower extremity cellulitis:   vancomycin and Unasyn  started on 06/03/2012, . Wound care consult requested.  A. fib with RVR: rate controlled.  - She is on bisoprolol at home, resume bisoprolol. Her cardiologist is Dr. Nanetta Batty. Her CHADS greater than 2.she was started on xarelto .  BMI 70 and over, adult  - PT consult.  HTN (hypertension)  - well controlled contiue current treatment  Code Status: full code. Family Communication: none at bedside Disposition Plan: pending   Consultants:  cardiology  Antibiotics:  Vancomycin   unasyn  HPI/Subjective: Comfortable, lying in bed  Objective: Filed Vitals:   06/05/12 1530 06/05/12 2144 06/06/12 0549 06/06/12 1300  BP: 144/62 135/62 137/68 131/68  Pulse: 99 97 102 95  Temp: 98.6 F (37 C) 99.4 F (37.4 C) 100 F (37.8 C) 99.3 F (37.4 C)  TempSrc: Oral Oral Oral Oral  Resp: 20 18 18 17   Height:      Weight:   173 kg (381 lb 6.3 oz)   SpO2: 93% 95% 93% 97%    Intake/Output Summary (Last 24 hours) at 06/06/12 1945 Last data filed at 06/06/12 1900  Gross per 24 hour  Intake   1120 ml  Output    650 ml  Net    470 ml   Filed Weights   06/04/12 0532 06/05/12 0328 06/06/12 0549  Weight: 170.6 kg (376 lb 1.7 oz) 175.1 kg (386 lb 0.4 oz) 173 kg (381 lb 6.3 oz)    Exam:   General:  Alert afebrile comfortable  Cardiovascular: s1s2 irregular, tachycardic.  Respiratory: DECREASED air entry at bases.   Abdomen: obese, soft NT ND BS+  Extremities: bilateral lower extremity cellulitis, draining   Data Reviewed: Basic Metabolic Panel:  Lab 06/05/12 8119 06/04/12 0534 06/03/12 1715  NA 136 134* 135  K 3.8 3.6 3.4*  CL 103 103 101  CO2 22 23 21   GLUCOSE 100* 96 94  BUN 10 9  12   CREATININE 0.84 0.80 0.87  CALCIUM 8.6 8.4 9.0  MG 2.0 -- --  PHOS -- -- --   Liver Function Tests:  Lab 06/04/12 0534  AST 8  ALT 5  ALKPHOS 87  BILITOT 0.4  PROT 6.4  ALBUMIN 2.1*   No results found for this basename: LIPASE:5,AMYLASE:5 in the last 168 hours No results found for this basename: AMMONIA:5 in the last 168 hours CBC:  Lab 06/05/12 0525 06/04/12 0534 06/03/12 1715  WBC 10.5 9.3 13.0*  NEUTROABS -- -- 9.6*  HGB 10.0* 10.0* 10.6*  HCT 31.2* 31.0* 32.4*  MCV 92.6 92.3 91.8  PLT 277 272 315   Cardiac Enzymes: No results found for this basename: CKTOTAL:5,CKMB:5,CKMBINDEX:5,TROPONINI:5 in the last 168 hours BNP (last 3 results) No results found for this basename: PROBNP:3 in the last 8760 hours CBG: No results found for this basename: GLUCAP:5 in the last 168 hours  Recent Results (from the past 240 hour(s))  CULTURE, BLOOD (ROUTINE X 2)     Status: Normal (Preliminary result)   Collection Time   06/04/12 12:50 AM      Component Value Range Status Comment   Specimen Description BLOOD RIGHT ARM   Final    Special Requests BOTTLES DRAWN AEROBIC AND ANAEROBIC 5CC BOTH   Final    Culture  Setup  Time 06/04/2012 15:39   Final    Culture     Final    Value:        BLOOD CULTURE RECEIVED NO GROWTH TO DATE CULTURE WILL BE HELD FOR 5 DAYS BEFORE ISSUING A FINAL NEGATIVE REPORT   Report Status PENDING   Incomplete   CULTURE, BLOOD (ROUTINE X 2)     Status: Normal (Preliminary result)   Collection Time   06/04/12  1:15 AM      Component Value Range Status Comment   Specimen Description BLOOD RIGHT ARM   Final    Special Requests     Final    Value: BOTTLES DRAWN AEROBIC AND ANAEROBIC 3CC AEB, 2CC ANA   Culture  Setup Time 06/04/2012 15:39   Final    Culture     Final    Value:        BLOOD CULTURE RECEIVED NO GROWTH TO DATE CULTURE WILL BE HELD FOR 5 DAYS BEFORE ISSUING A FINAL NEGATIVE REPORT   Report Status PENDING   Incomplete      Studies: No results  found.  Scheduled Meds:    . ampicillin-sulbactam (UNASYN) IV  1.5 g Intravenous Q6H  . citalopram  10 mg Oral Daily  . lisinopril  20 mg Oral Daily  . nebivolol  10 mg Oral Daily  . rivaroxaban  20 mg Oral Q supper  . sodium chloride  3 mL Intravenous Q12H  . vancomycin  1,250 mg Intravenous Q12H   Continuous Infusions:    Principal Problem:  *Lower extremity cellulitis Active Problems:  BMI 70 and over, adult  HTN (hypertension)  Sepsis  Lactic acidosis  Obstructive sleep apnea on CPAP  PAF (paroxysmal atrial fibrillation)       [name]  [facility] Pager [phone]. If 8PM-8AM, please contact night-coverage at [email or web address], password [facility] 06/06/2012, 7:45 PM  LOS: 3 days

## 2012-06-06 NOTE — Consult Note (Addendum)
WOC consult Note Reason for Consult: eval. LE wounds.  Pt with longstanding history of venous stasis dx. Treated by [facility] for many months and per pt "almost healed" then she had different HHRN and the legs "got really bad and infected".  She presents today with LE venous stasis ulcerations that extend the entire posterior calf and are extremely painful with dressing removal.  She has surrounding cellulitis and classic hemosiderin staining and edema.  She does have palpable pulses. She reports she can not tolerate Profore multilayer compression "I am allergic to it". She has compression pumps at home she uses 3x per day.  She has had Unna's boots as well in the past. Her most successful tx regimen has been absorbant silver dressings to the wounds, covered with silicone foam to manage the exudate, then wrapped with kerlix and coban.   Wound type: extensive venous stasis ulcerations Measurement: R posterior: 17cm x 25cm x 0.2cm L posterior: 14cm x 20cm x 0.2cm   Wound bed: Both wound beds have yellow thin layer of slough material with some epithelial buds throughout the wound beds, some darkening in both wound beds but not extensively necrotic.   Drainage (amount, consistency, odor) heavy, serous drainage with no odor Periwound:macerated at some areas, cellulitis circumferentially  Dressing procedure/placement/frequency: Silver hydrofiber to the wounds, cover with foam and wrap with kerlix and coban. Change M/W/F, then as wounds improve could lessen frequency.  Will need to resume HHRN for wound care at the time of discharge.   WOC will follow along with you for wound assessment/managment. Jaquese Irving Lapeer RN,CWOCN [phone]

## 2012-06-06 NOTE — Progress Notes (Signed)
PT Cancellation Note  Patient Details Name: [name] MRN: [id number] DOB: 1951-03-03   Cancelled Treatment:    Reason Eval/Treat Not Completed: Pain limiting ability to participate.  Pt states that she will work with therapy tomorrow.  Will follow.   Thanks,    Page, Meribeth Mattes 06/06/2012, 1:16 PM

## 2012-06-06 NOTE — Progress Notes (Signed)
OT Cancellation Note  Patient Details Name: [name] MRN: [id number] DOB: 08/28/1950   Cancelled Treatment:    Reason Eval/Treat Not Completed: Other (comment) (pt states she doesnt feel good; waiting on LE wrappings. Will try back later today or am.)  Lennox Laity [phone] 06/06/2012, 11:55 AM

## 2012-06-07 ENCOUNTER — Inpatient Hospital Stay (HOSPITAL_COMMUNITY): Payer: Medicare Other

## 2012-06-07 LAB — CREATININE, SERUM
GFR calc Af Amer: 80 mL/min — ABNORMAL LOW (ref >90)
GFR calc non Af Amer: 69 mL/min — ABNORMAL LOW (ref >90)

## 2012-06-07 LAB — CBC
MCHC: 32.4 g/dL (ref 30.0–36.0)
Platelets: 298 10*3/uL (ref 150–400)
RDW: 14.2 % (ref 11.5–15.5)

## 2012-06-07 LAB — VANCOMYCIN, TROUGH: Vancomycin Tr: 23.6 ug/mL — ABNORMAL HIGH (ref 10.0–20.0)

## 2012-06-07 MED ORDER — AMPICILLIN-SULBACTAM SODIUM 3 (2-1) G IJ SOLR
3.0000 g | Freq: Four times a day (QID) | INTRAMUSCULAR | Status: DC
  Administered 2012-06-07 – 2012-06-08 (×5): 3 g via INTRAVENOUS
  Filled 2012-06-07 (×7): qty 3

## 2012-06-07 NOTE — Care Management Note (Unsigned)
    Page 1 of 1   06/07/2012     2:55:54 PM   CARE MANAGEMENT NOTE 06/07/2012  Patient:  [name], [name]   Account Number:  [id number]  Date Initiated:  06/07/2012  Documentation initiated by:  Lanier Clam  Subjective/Objective Assessment:   ADMITTED W/BILAT LWR EXTREMITY CELLULITIS.     Action/Plan:   FROM HOME.   Anticipated DC Date:  06/10/2012   Anticipated DC Plan:  HOME W HOME HEALTH SERVICES      DC Planning Services  CM consult      [facility] Choice  Resumption Of Svcs/PTA Provider   Choice offered to / List presented to:  C-1 Patient           Status of service:  In process, will continue to follow Medicare Important Message given?   (If response is "NO", the following Medicare IM given date fields will be blank) Date Medicare IM given:   Date Additional Medicare IM given:    Discharge Disposition:    Per UR Regulation:  Reviewed for med. necessity/level of care/duration of stay  If discussed at Long Length of Stay Meetings, dates discussed:    Comments:  06/07/12 Noelene Gang RN,BSN NCM [phone] PATIENT DECLINE HHOT.RECOMMEND HHRN/PT.FOLLOWS @ WOUND CARE CENTER.[facility]) AWARE,& FOLLOWING FOR HH ORDERS.AMBULANCE TRANSP WILL BE NEEDED.HAS FAMILY/FRIEND SUPPORT. ACTIVE [facility] HHRN.PT/OT-HH.WOC FOLLOWING.

## 2012-06-07 NOTE — Progress Notes (Signed)
[facility] PROGRESS NOTE  [name] ION:[id number] DOB: 22-Oct-1950 DOA: 06/03/2012 PCP: Alva Garnet., MD  Assessment/Plan: Sepsis/ Lactic acidosis  - Is most likely secondary to her lower extremity cellulitis. Improving.  Lower extremity cellulitis:   vancomycin and Unasyn  started on 06/03/2012, . Wound care consult requested.  A. fib with RVR: rate controlled.  - She is on bisoprolol at home, resume bisoprolol. Her cardiologist is Dr. Nanetta Batty. Her CHADS greater than 2.she was started on xarelto .  BMI 70 and over, adult  - PT consult.  HTN (hypertension)  - well controlled contiue current treatment  Fever of 101 yesterday. : unfortunately blood cultures not done. CXR ordered , showed pulm vasc congestion with mild interstitial edema. UA Ordered.   Code Status: full code. Family Communication: none at bedside Disposition Plan: pending   Consultants:  cardiology  Antibiotics:  Vancomycin   unasyn  HPI/Subjective: Comfortable, lying in bed  Objective: Filed Vitals:   06/06/12 2203 06/07/12 0523 06/07/12 1018 06/07/12 1413  BP: 126/69 123/62 145/82 135/70  Pulse: 95 82 82 98  Temp: 101.1 F (38.4 C) 98.8 F (37.1 C)  98.3 F (36.8 C)  TempSrc: Oral Oral  Oral  Resp: 18 18  20   Height:      Weight:  171.7 kg (378 lb 8.5 oz)    SpO2: 94% 93%  93%    Intake/Output Summary (Last 24 hours) at 06/07/12 2140 Last data filed at 06/07/12 1900  Gross per 24 hour  Intake   1060 ml  Output    200 ml  Net    860 ml   Filed Weights   06/05/12 0328 06/06/12 0549 06/07/12 0523  Weight: 175.1 kg (386 lb 0.4 oz) 173 kg (381 lb 6.3 oz) 171.7 kg (378 lb 8.5 oz)    Exam:   General:  Alert afebrile comfortable  Cardiovascular: s1s2 irregular, tachycardic.  Respiratory: DECREASED air entry at bases.   Abdomen: obese, soft NT ND BS+  Extremities: bilateral lower extremity cellulitis, draining   Data Reviewed: Basic Metabolic Panel:  Lab  06/05/12 0525 06/04/12 0534 06/03/12 1715  NA 136 134* 135  K 3.8 3.6 3.4*  CL 103 103 101  CO2 22 23 21   GLUCOSE 100* 96 94  BUN 10 9 12   CREATININE 0.84 0.80 0.87  CALCIUM 8.6 8.4 9.0  MG 2.0 -- --  PHOS -- -- --   Liver Function Tests:  Lab 06/04/12 0534  AST 8  ALT 5  ALKPHOS 87  BILITOT 0.4  PROT 6.4  ALBUMIN 2.1*   No results found for this basename: LIPASE:5,AMYLASE:5 in the last 168 hours No results found for this basename: AMMONIA:5 in the last 168 hours CBC:  Lab 06/07/12 1950 06/05/12 0525 06/04/12 0534 06/03/12 1715  WBC 10.5 10.5 9.3 13.0*  NEUTROABS -- -- -- 9.6*  HGB 10.2* 10.0* 10.0* 10.6*  HCT 31.5* 31.2* 31.0* 32.4*  MCV 91.8 92.6 92.3 91.8  PLT 298 277 272 315   Cardiac Enzymes: No results found for this basename: CKTOTAL:5,CKMB:5,CKMBINDEX:5,TROPONINI:5 in the last 168 hours BNP (last 3 results) No results found for this basename: PROBNP:3 in the last 8760 hours CBG: No results found for this basename: GLUCAP:5 in the last 168 hours  Recent Results (from the past 240 hour(s))  CULTURE, BLOOD (ROUTINE X 2)     Status: Normal (Preliminary result)   Collection Time   06/04/12 12:50 AM      Component Value Range Status Comment  Specimen Description BLOOD RIGHT ARM   Final    Special Requests BOTTLES DRAWN AEROBIC AND ANAEROBIC 5CC BOTH   Final    Culture  Setup Time 06/04/2012 15:39   Final    Culture     Final    Value:        BLOOD CULTURE RECEIVED NO GROWTH TO DATE CULTURE WILL BE HELD FOR 5 DAYS BEFORE ISSUING A FINAL NEGATIVE REPORT   Report Status PENDING   Incomplete   CULTURE, BLOOD (ROUTINE X 2)     Status: Normal (Preliminary result)   Collection Time   06/04/12  1:15 AM      Component Value Range Status Comment   Specimen Description BLOOD RIGHT ARM   Final    Special Requests     Final    Value: BOTTLES DRAWN AEROBIC AND ANAEROBIC 3CC AEB, 2CC ANA   Culture  Setup Time 06/04/2012 15:39   Final    Culture     Final    Value:         BLOOD CULTURE RECEIVED NO GROWTH TO DATE CULTURE WILL BE HELD FOR 5 DAYS BEFORE ISSUING A FINAL NEGATIVE REPORT   Report Status PENDING   Incomplete      Studies: Dg Chest 2 View  06/07/2012  *RADIOLOGY REPORT*  Clinical Data: Fever, weakness  CHEST - 2 VIEW  Comparison: 09/07/2011  Findings: Cardiomegaly with pulmonary vascular congestion and possible mild interstitial edema.  Opacity overlying the right lower lung likely reflects overlying soft tissue.  Degenerative changes of the visualized thoracolumbar spine.  IMPRESSION: Cardiomegaly with pulmonary vascular congestion and possible mild interstitial edema.   Original Report Authenticated By: Charline Bills, M.D.     Scheduled Meds:    . ampicillin-sulbactam (UNASYN) IV  3 g Intravenous Q6H  . citalopram  10 mg Oral Daily  . lisinopril  20 mg Oral Daily  . nebivolol  10 mg Oral Daily  . rivaroxaban  20 mg Oral Q supper  . sodium chloride  3 mL Intravenous Q12H  . vancomycin  1,250 mg Intravenous Q12H   Continuous Infusions:    Principal Problem:  *Lower extremity cellulitis Active Problems:  BMI 70 and over, adult  HTN (hypertension)  Sepsis  Lactic acidosis  Obstructive sleep apnea on CPAP  PAF (paroxysmal atrial fibrillation)       [name]  [facility] Pager [phone]. If 8PM-8AM, please contact night-coverage at [email or web address], password [facility] 06/07/2012, 9:40 PM  LOS: 4 days

## 2012-06-07 NOTE — Progress Notes (Signed)
[facility]  Patient Status: Active (receiving services up to time of hospitalization)  [facility] is providing the following services: RN  If patient discharges after hours, please call [phone].   Please resume HHRN at discharge if agree.    Norberta Keens, RN, BSN [phone] 06/07/2012, 12:13 PM

## 2012-06-07 NOTE — Evaluation (Addendum)
Occupational Therapy Evaluation Patient Details Name: [name] MRN: [id number] DOB: [birth year] Today's Date: 06/07/2012 Time: 4098-1191 OT Time Calculation (min): 20 min  OT Assessment / Plan / Recommendation Clinical Impression  Pt comes in for lower extremity redness erythema swelling and purulent drainage of lower extremities bilaterally. Pt displays decreased strength, increased pain to touch of LEs, and overall decreased independence with ADL and functional mobility. Will benefit from skilled OT services to improve independence in these areas.     OT Assessment  Patient needs continued OT Services    Follow Up Recommendations  Pt will likely need SNF as she lives alone. But if pt refuses SNF then will need Home health OT;Other (comment) (aide) and 24/7 supervision/assist   Barriers to Discharge      Equipment Recommendations  None recommended by OT    Recommendations for Other Services    Frequency  Min 2X/week    Precautions / Restrictions Precautions Precautions: Fall Precaution Comments: BLE dressings due to cellulitis.  Very sensitive on posterior aspect of distal LEs.  Restrictions Weight Bearing Restrictions: No        ADL  Eating/Feeding: Simulated;Independent Where Assessed - Eating/Feeding: Bed level Grooming: Simulated;Wash/dry hands;Set up Where Assessed - Grooming: Supported sitting Upper Body Bathing: Simulated;Chest;Right arm;Left arm;Abdomen;Minimal assistance Where Assessed - Upper Body Bathing: Unsupported sitting Lower Body Bathing: Simulated;+2 Total assistance Lower Body Bathing: Patient Percentage: 60% (LEs wrapped so simulate with wash just tops of legs & stand) Where Assessed - Lower Body Bathing: Supported sit to stand Upper Body Dressing: Simulated;Minimal assistance Where Assessed - Upper Body Dressing: Unsupported sitting Lower Body Dressing: Simulated;+1 Total assistance;Other (comment) (with socks. pt states she never wears  socks.) Where Assessed - Lower Body Dressing: Supported sitting Toilet Transfer: Simulated;+2 Total assistance Toilet Transfer: Patient Percentage: 60% Toilet Transfer Method: Stand pivot Toileting - Clothing Manipulation and Hygiene: Simulated;+2 Total assistance Toileting - Architect and Hygiene: Patient Percentage: 60% Where Assessed - Glass blower/designer Manipulation and Hygiene: Standing Tub/Shower Transfer Method: Not assessed Equipment Used: Rolling walker ADL Comments: Pt states she doesnt wear socks at home and only slip on shoes. She noramlly dresses in the bathroom on toilet and has to reach down to slide legs into pants and some days this is very difficult. Demonstrated reacher (she has one with suction cups at home) and she can use for LB dressing and where to obtain. Pt verbalized understanding.     OT Diagnosis: Generalized weakness;Acute pain  OT Problem List: Decreased strength;Decreased knowledge of use of DME or AE;Pain;Decreased activity tolerance OT Treatment Interventions: Self-care/ADL training;Therapeutic activities;DME and/or AE instruction;Patient/family education   OT Goals Acute Rehab OT Goals OT Goal Formulation: With patient Time For Goal Achievement: 06/21/12 Potential to Achieve Goals: Good ADL Goals Pt Will Perform Grooming: with min assist;Other (comment);Standing at sink (min guard) ADL Goal: Grooming - Progress: Goal set today Pt Will Perform Lower Body Bathing: with min assist;with adaptive equipment;Sit to stand from chair;Sit to stand from bed;Other (comment) (excluding wrapped LE areas) ADL Goal: Lower Body Bathing - Progress: Goal set today Pt Will Perform Lower Body Dressing: with min assist;Sit to stand from chair;Sit to stand from bed;Other (comment) (underwear, pants, and slip on shoes) ADL Goal: Lower Body Dressing - Progress: Goal set today Pt Will Transfer to Toilet: with min assist;Ambulation;with DME;3-in-1 ADL Goal: Toilet  Transfer - Progress: Goal set today Pt Will Perform Toileting - Clothing Manipulation: with min assist;Standing;Other (comment) (min guard) ADL Goal: Toileting - Clothing Manipulation -  Progress: Goal set today  Visit Information  Last OT Received On: 06/07/12 Assistance Needed: +2 PT/OT Co-Evaluation/Treatment: Yes    Subjective Data  Subjective: i dont like wearing these socks Patient Stated Goal: agreeable up to chair; none stated   Prior Functioning     Home Living Lives With: Alone Available Help at Discharge: Neighbor Type of Home: House Home Access: Stairs to enter Secretary/administrator of Steps: 1 Home Layout: One level Bathroom Shower/Tub: Engineer, manufacturing systems: Standard Home Adaptive Equipment: Bedside commode/3-in-1;Walker - rolling;Reacher Prior Function Level of Independence: Independent with assistive device(s) Able to Take Stairs?: Yes Driving: Yes Communication Communication: No difficulties Dominant Hand: Right         Vision/Perception     Cognition  Overall Cognitive Status: Appears within functional limits for tasks assessed/performed Arousal/Alertness: Awake/alert Orientation Level: Appears intact for tasks assessed Behavior During Session: [facility] for tasks performed    Extremity/Trunk Assessment Right Upper Extremity Assessment RUE ROM/Strength/Tone: [facility] for tasks assessed Left Upper Extremity Assessment LUE ROM/Strength/Tone: [facility] for tasks assessed Right Lower Extremity Assessment RLE ROM/Strength/Tone: Deficits RLE ROM/Strength/Tone Deficits: WFL per functional assessment.  did not formally test due to BLE dressings/pain.  Left Lower Extremity Assessment LLE ROM/Strength/Tone: Deficits LLE ROM/Strength/Tone Deficits: WFL per functional assessment.  Did not formally test due to BLE dressings/pain.  Trunk Assessment Trunk Assessment: Kyphotic     Mobility Bed Mobility Bed Mobility: Supine to Sit (Simultaneous filing. User  may not have seen previous data.) Supine to Sit: 1: +2 Total assist (Simultaneous filing. User may not have seen previous data.) Supine to Sit: Patient Percentage: 50% (Simultaneous filing. User may not have seen previous data.) Details for Bed Mobility Assistance: Requires hand held assist from therapist for trunk to attain upright position and some assist for LLE out of bed.  Provided cues for hand placement to self assist.  (Simultaneous filing. User may not have seen previous data.) Transfers Transfers: Sit to Stand;Stand to Sit Sit to Stand: 1: +2 Total assist;From elevated surface;With upper extremity assist;From bed (Simultaneous filing. User may not have seen previous data.) Sit to Stand: Patient Percentage: 70% (Simultaneous filing. User may not have seen previous data.) Stand to Sit: 1: +2 Total assist;With upper extremity assist;With armrests;To chair/3-in-1 (Simultaneous filing. User may not have seen previous data.) Stand to Sit: Patient Percentage: 70% (Simultaneous filing. User may not have seen previous data.) Details for Transfer Assistance: Assist to rise and steady (bari bed very elevated) with cues for hand placement, safety and controlled descent when sitting. She was able to take several steps from bed to chair, however +2 assist for safety with pt stating that it was difficult to move her feet due to socks.  Cues for sequencing/technique with RW.   (Simultaneous filing. User may not have seen previous data.)     Shoulder Instructions     Exercise     Balance     End of Session OT - End of Session Activity Tolerance: Patient limited by pain Patient left: in chair;with call bell/phone within reach  GO     Lennox Laity [phone] 06/07/2012, 10:26 AM

## 2012-06-07 NOTE — Evaluation (Signed)
Physical Therapy Evaluation Patient Details Name: [name] MRN: [id number] DOB: 1951-01-17 Today's Date: 06/07/2012 Time: 7829-5621 PT Time Calculation (min): 20 min  PT Assessment / Plan / Recommendation Clinical Impression  Pt presents with BLE cellulitis resulting in sepsis and some afib.  PT familiar with pt and has worked with her on previous episodes of care.  Tolerated OOB and several steps from bed to chair, however requires +2 assist at this time for safety.  Pt states that she was unable to move her feet well due to socks on feet.  Pt will benefit from skilled PT in acute venue to address deficits.  PT recommends [facility] SNF for follow up, however fear that pt will refuse SNF.  If pt does refuse then recommend HHPT for follow up at D/C.      PT Assessment  Patient needs continued PT services    Follow Up Recommendations  Home health PT;SNF;Supervision/Assistance - 24 hour    Does the patient have the potential to tolerate intense rehabilitation      Barriers to Discharge Decreased caregiver support      Equipment Recommendations       Recommendations for Other Services     Frequency Min 3X/week    Precautions / Restrictions Precautions Precautions: Fall Precaution Comments: BLE dressings due to cellulitis.  Very sensitive on posterior aspect of distal LEs.  Restrictions Weight Bearing Restrictions: No   Pertinent Vitals/Pain 8/10 back of LEs from recliner touching them, down to 4/10 at end of session.       Mobility  Bed Mobility Bed Mobility: Supine to Sit (Simultaneous filing. User may not have seen previous data.) Supine to Sit: 1: +2 Total assist (Simultaneous filing. User may not have seen previous data.) Supine to Sit: Patient Percentage: 50% (Simultaneous filing. User may not have seen previous data.) Details for Bed Mobility Assistance: Requires hand held assist from therapist for trunk to attain upright position and some assist for LLE out of bed.   Provided cues for hand placement to self assist.  (Simultaneous filing. User may not have seen previous data.) Transfers Transfers: Sit to Stand;Stand to Sit;Stand Pivot Transfers Sit to Stand: 1: +2 Total assist;From elevated surface;With upper extremity assist;From bed (Simultaneous filing. User may not have seen previous data.) Sit to Stand: Patient Percentage: 70% (Simultaneous filing. User may not have seen previous data.) Stand to Sit: 1: +2 Total assist;With upper extremity assist;With armrests;To chair/3-in-1 (Simultaneous filing. User may not have seen previous data.) Stand to Sit: Patient Percentage: 70% (Simultaneous filing. User may not have seen previous data.) Stand Pivot Transfers: 1: +2 Total assist Stand Pivot Transfers: Patient Percentage: 60% Details for Transfer Assistance: Assist to rise and steady (bari bed very elevated) with cues for hand placement, safety and controlled descent when sitting. She was able to take several steps from bed to chair, however +2 assist for safety with pt stating that it was difficult to move her feet due to socks.  Cues for sequencing/technique with RW.   (Simultaneous filing. User may not have seen previous data.) Ambulation/Gait Ambulation/Gait Assistance: Not tested (comment) Stairs: No Wheelchair Mobility Wheelchair Mobility: No    Shoulder Instructions     Exercises     PT Diagnosis: Difficulty walking;Generalized weakness;Acute pain  PT Problem List: Decreased strength;Decreased activity tolerance;Decreased balance;Decreased mobility;Decreased coordination;Decreased knowledge of use of DME;Obesity;Decreased safety awareness;Pain;Decreased skin integrity PT Treatment Interventions: DME instruction;Gait training;Stair training;Functional mobility training;Therapeutic activities;Therapeutic exercise;Balance training;Patient/family education   PT Goals Acute Rehab PT Goals  PT Goal Formulation: With patient Time For Goal Achievement:  06/21/12 Potential to Achieve Goals: Good Pt will go Supine/Side to Sit: with supervision PT Goal: Supine/Side to Sit - Progress: Goal set today Pt will go Sit to Supine/Side: with supervision PT Goal: Sit to Supine/Side - Progress: Goal set today Pt will go Sit to Stand: with supervision PT Goal: Sit to Stand - Progress: Goal set today Pt will go Stand to Sit: with supervision PT Goal: Stand to Sit - Progress: Goal set today Pt will Ambulate: 51 - 150 feet;with supervision;with least restrictive assistive device PT Goal: Ambulate - Progress: Goal set today Pt will Go Up / Down Stairs: 1-2 stairs;with min assist;with least restrictive assistive device PT Goal: Up/Down Stairs - Progress: Goal set today  Visit Information  Last PT Received On: 06/07/12 Assistance Needed: +2 PT/OT Co-Evaluation/Treatment: Yes    Subjective Data  Subjective: I can't walk in these socks.  Patient Stated Goal: n/a   Prior Functioning  Home Living Lives With: Alone Available Help at Discharge: Neighbor Type of Home: House Home Access: Stairs to enter Secretary/administrator of Steps: 1 Home Layout: One level Bathroom Shower/Tub: Engineer, manufacturing systems: Standard Home Adaptive Equipment: Bedside commode/3-in-1;Walker - rolling;Reacher Prior Function Level of Independence: Independent with assistive device(s) Able to Take Stairs?: Yes Driving: Yes Communication Communication: No difficulties Dominant Hand: Right    Cognition  Overall Cognitive Status: Appears within functional limits for tasks assessed/performed Arousal/Alertness: Awake/alert Orientation Level: Appears intact for tasks assessed Behavior During Session: [facility] for tasks performed    Extremity/Trunk Assessment Right Upper Extremity Assessment RUE ROM/Strength/Tone: [facility] for tasks assessed Left Upper Extremity Assessment LUE ROM/Strength/Tone: [facility] for tasks assessed Right Lower Extremity Assessment RLE ROM/Strength/Tone:  Deficits RLE ROM/Strength/Tone Deficits: WFL per functional assessment.  did not formally test due to BLE dressings/pain.  Left Lower Extremity Assessment LLE ROM/Strength/Tone: Deficits LLE ROM/Strength/Tone Deficits: WFL per functional assessment.  Did not formally test due to BLE dressings/pain.  Trunk Assessment Trunk Assessment: Kyphotic   Balance    End of Session PT - End of Session Activity Tolerance: Patient limited by pain Patient left: in chair;with call bell/phone within reach Nurse Communication: Mobility status  GP     Page, Meribeth Mattes 06/07/2012, 10:28 AM

## 2012-06-07 NOTE — Progress Notes (Signed)
ANTIBIOTIC CONSULT NOTE - FOLLOW UP  Pharmacy Consult for Vancomycin Indication: Cellulitis  Allergies  Allergen Reactions  . Effexor (Venlafaxine Hydrochloride) Anxiety    Made her want to kill herself    Patient Measurements: Height: 5\' 3"  (160 cm) Weight: 378 lb 8.5 oz (171.7 kg) IBW/kg (Calculated) : 52.4   Vital Signs: Temp: 98.8 F (37.1 C) (12/17 0523) Temp src: Oral (12/17 0523) BP: 123/62 mmHg (12/17 0523) Pulse Rate: 82  (12/17 0523) Intake/Output from previous day: 12/16 0701 - 12/17 0700 In: 650 [IV Piggyback:650] Out: 851 [Urine:850; Stool:1] Intake/Output from this shift:    Labs:  [facility] 06/05/12 0525  WBC 10.5  HGB 10.0*  PLT 277  LABCREA --  CREATININE 0.84   Estimated Creatinine Clearance: 108.3 ml/min (by C-G formula based on Cr of 0.84).  Basename 06/05/12 1917  VANCOTROUGH 17.4  VANCOPEAK --  Drue Dun --  GENTTROUGH --  GENTPEAK --  GENTRANDOM --  TOBRATROUGH --  Nolen Mu --  TOBRARND --  AMIKACINPEAK --  AMIKACINTROU --  AMIKACIN --     Microbiology: Recent Results (from the past 720 hour(s))  CULTURE, BLOOD (ROUTINE X 2)     Status: Normal (Preliminary result)   Collection Time   06/04/12 12:50 AM      Component Value Range Status Comment   Specimen Description BLOOD RIGHT ARM   Final    Special Requests BOTTLES DRAWN AEROBIC AND ANAEROBIC 5CC BOTH   Final    Culture  Setup Time 06/04/2012 15:39   Final    Culture     Final    Value:        BLOOD CULTURE RECEIVED NO GROWTH TO DATE CULTURE WILL BE HELD FOR 5 DAYS BEFORE ISSUING A FINAL NEGATIVE REPORT   Report Status PENDING   Incomplete   CULTURE, BLOOD (ROUTINE X 2)     Status: Normal (Preliminary result)   Collection Time   06/04/12  1:15 AM      Component Value Range Status Comment   Specimen Description BLOOD RIGHT ARM   Final    Special Requests     Final    Value: BOTTLES DRAWN AEROBIC AND ANAEROBIC 3CC AEB, 2CC ANA   Culture  Setup Time 06/04/2012 15:39    Final    Culture     Final    Value:        BLOOD CULTURE RECEIVED NO GROWTH TO DATE CULTURE WILL BE HELD FOR 5 DAYS BEFORE ISSUING A FINAL NEGATIVE REPORT   Report Status PENDING   Incomplete     Anti-infectives     Start     Dose/Rate Route Frequency Ordered Stop   06/05/12 2200   vancomycin (VANCOCIN) 1,250 mg in sodium chloride 0.9 % 250 mL IVPB        1,250 mg 166.7 mL/hr over 90 Minutes Intravenous Every 12 hours 06/05/12 2026     06/03/12 2315   ampicillin-sulbactam (UNASYN) 1.5 g in sodium chloride 0.9 % 50 mL IVPB        1.5 g 100 mL/hr over 30 Minutes Intravenous 4 times per day 06/03/12 2251     06/03/12 2000   vancomycin (VANCOCIN) 1,500 mg in sodium chloride 0.9 % 500 mL IVPB  Status:  Discontinued        1,500 mg 250 mL/hr over 120 Minutes Intravenous Every 12 hours 06/03/12 1934 06/05/12 2025   06/03/12 1830   clindamycin (CLEOCIN) IVPB 600 mg  600 mg 100 mL/hr over 30 Minutes Intravenous  Once 06/03/12 1819 06/03/12 2115          Assessment:  61 YOF admitted with bilateral cellulitis  Day #5 Vancomycin and Unasyn  Renal function remains stable, WBC normal, febrile overnight 101.1  Goal of Therapy:  Vancomycin trough level 10-15 mcg/ml  Plan:   Check vancomycin trough and SCr tonight to assess dosage change from 12/15  Recommend increasing Unasyn to 3gm IV q6h due to weight Follow up renal function & cultures Follow up clinical resolution of symptoms, possible de-escalation of antibiotics  Loralee Pacas, PharmD, BCPS Pager: [phone] 06/07/2012,8:27 AM

## 2012-06-07 NOTE — Progress Notes (Signed)
Physical Therapy Treatment Patient Details Name: [name] MRN: [id number] DOB: [birth year] Today's Date: 06/07/2012 Time: 7829-5621 PT Time Calculation (min): 25 min  PT Assessment / Plan / Recommendation Comments on Treatment Session  Pt requested to ambulate in room for safety and was able to ambulate to/from restroom door x 2 with +2 assist for safety.  Noted that pts dressings are draining through bandages.  RN notified.      Follow Up Recommendations  Home health PT;SNF;Supervision/Assistance - 24 hour     Does the patient have the potential to tolerate intense rehabilitation     Barriers to Discharge        Equipment Recommendations  None recommended by PT    Recommendations for Other Services    Frequency Min 3X/week   Plan Discharge plan remains appropriate    Precautions / Restrictions Precautions Precautions: Fall Precaution Comments: BLE dressings due to cellulitis.  Very sensitive on posterior aspect of distal LEs.  Restrictions Weight Bearing Restrictions: No   Pertinent Vitals/Pain 4/10 pain in BLEs    Mobility  Bed Mobility Bed Mobility: Sit to Supine;Scooting to HOB Sit to Supine: 3: Mod assist;HOB flat Scooting to HOB: Other (comment);5: Set up (with bed in Trendelenburg) Details for Bed Mobility Assistance: Requires assist for BLEs into bed (assisted at ankle due to increased pain in calf area) with cues for adjusting hips once in bed.  Transfers Transfers: Sit to Stand;Stand to Sit;Stand Pivot Transfers Sit to Stand: 1: +2 Total assist;With upper extremity assist;With armrests;From chair/3-in-1 Sit to Stand: Patient Percentage: 70% Stand to Sit: 1: +2 Total assist;With upper extremity assist;To bed Stand to Sit: Patient Percentage: 70% Details for Transfer Assistance: Assist to rise and steady with cues for hand placement and safety  Ambulation/Gait Ambulation/Gait Assistance: 1: +2 Total assist Ambulation/Gait: Patient Percentage:  70% Ambulation Distance (Feet): 20 Feet (x2 reps) Assistive device: Rolling walker Ambulation/Gait Assistance Details: Assist to steady with cues for maintaining position inside of RW and negotiating RW in room, esp when making turns.  Gait Pattern: Step-through pattern;Decreased stride length;Shuffle;Trunk flexed;Wide base of support Gait velocity: decreased    Exercises     PT Diagnosis:    PT Problem List:   PT Treatment Interventions:     PT Goals Acute Rehab PT Goals PT Goal Formulation: With patient Time For Goal Achievement: 06/21/12 Potential to Achieve Goals: Good Pt will go Sit to Supine/Side: with supervision PT Goal: Sit to Supine/Side - Progress: Progressing toward goal Pt will go Sit to Stand: with supervision PT Goal: Sit to Stand - Progress: Progressing toward goal Pt will go Stand to Sit: with supervision PT Goal: Stand to Sit - Progress: Progressing toward goal Pt will Ambulate: 51 - 150 feet;with supervision;with least restrictive assistive device PT Goal: Ambulate - Progress: Progressing toward goal  Visit Information  Last PT Received On: 06/07/12 Assistance Needed: +2    Subjective Data  Subjective: I just want to do all I can to get better.  Patient Stated Goal: to return home   Cognition  Overall Cognitive Status: Appears within functional limits for tasks assessed/performed Arousal/Alertness: Awake/alert Orientation Level: Appears intact for tasks assessed Behavior During Session: [facility] for tasks performed    Balance     End of Session PT - End of Session Activity Tolerance: Patient limited by fatigue;Patient limited by pain Patient left: in bed;with call bell/phone within reach Nurse Communication: Mobility status;Other (comment) (dressings)   GP     Page, Meribeth Mattes 06/07/2012, 2:27  PM   

## 2012-06-08 DIAGNOSIS — G4733 Obstructive sleep apnea (adult) (pediatric): Secondary | ICD-10-CM

## 2012-06-08 DIAGNOSIS — A419 Sepsis, unspecified organism: Secondary | ICD-10-CM

## 2012-06-08 LAB — URINALYSIS, ROUTINE W REFLEX MICROSCOPIC
Bilirubin Urine: NEGATIVE
Nitrite: NEGATIVE
Specific Gravity, Urine: 1.036 — ABNORMAL HIGH (ref 1.005–1.030)
Urobilinogen, UA: 0.2 mg/dL (ref 0.0–1.0)

## 2012-06-08 LAB — BASIC METABOLIC PANEL
GFR calc Af Amer: 80 mL/min — ABNORMAL LOW (ref >90)
GFR calc non Af Amer: 69 mL/min — ABNORMAL LOW (ref >90)
Potassium: 3.6 mEq/L (ref 3.5–5.1)
Sodium: 136 mEq/L (ref 135–145)

## 2012-06-08 LAB — VANCOMYCIN, TROUGH: Vancomycin Tr: 19 ug/mL (ref 10.0–20.0)

## 2012-06-08 MED ORDER — HYDRALAZINE HCL 20 MG/ML IJ SOLN: 10.0000 mg | Freq: Three times a day (TID) | INTRAMUSCULAR | Status: DC | PRN

## 2012-06-08 MED ORDER — NEBIVOLOL HCL 5 MG PO TABS
5.0000 mg | ORAL_TABLET | Freq: Every day | ORAL | Status: DC
Start: 2012-06-08 — End: 2012-06-08
  Administered 2012-06-08: 5 mg via ORAL
  Filled 2012-06-08: qty 1

## 2012-06-08 MED ORDER — RIVAROXABAN 20 MG PO TABS: 20.0000 mg | ORAL_TABLET | Freq: Every day | ORAL | Status: DC

## 2012-06-08 MED ORDER — VANCOMYCIN HCL IN DEXTROSE 1-5 GM/200ML-% IV SOLN
1000.0000 mg | Freq: Two times a day (BID) | INTRAVENOUS | Status: DC
  Administered 2012-06-08: 1000 mg via INTRAVENOUS
  Filled 2012-06-08 (×2): qty 200

## 2012-06-08 MED ORDER — OXYCODONE-ACETAMINOPHEN 5-325 MG PO TABS: 1.0000 | ORAL_TABLET | Freq: Four times a day (QID) | ORAL | Status: DC | PRN

## 2012-06-08 MED ORDER — HYDROMORPHONE HCL PF 1 MG/ML IJ SOLN
1.0000 mg | Freq: Once | INTRAMUSCULAR | Status: AC
Start: 2012-06-08 — End: 2012-06-08
  Administered 2012-06-08: 1 mg via INTRAVENOUS

## 2012-06-08 MED ORDER — VANCOMYCIN HCL 10 G IV SOLR
1250.0000 mg | Freq: Two times a day (BID) | INTRAVENOUS | Status: DC
  Filled 2012-06-08: qty 1250

## 2012-06-08 MED ORDER — SULFAMETHOXAZOLE-TRIMETHOPRIM 800-160 MG PO TABS: 1.0000 | ORAL_TABLET | Freq: Two times a day (BID) | ORAL | Status: AC

## 2012-06-08 NOTE — Progress Notes (Signed)
Patient is resting peacefully. No additional pauses noted since pt was placed on 2L O2. Will continue to monitor.

## 2012-06-08 NOTE — Progress Notes (Signed)
ANTIBIOTIC CONSULT NOTE - FOLLOW UP  Pharmacy Consult for Vancomycin Indication: Cellulitis  Allergies  Allergen Reactions  . Effexor (Venlafaxine Hydrochloride) Anxiety    Made her want to kill herself    Patient Measurements: Height: 5\' 3"  (160 cm) Weight: 383 lb 13.1 oz (174.1 kg) IBW/kg (Calculated) : 52.4   Vital Signs: Temp: 98.7 F (37.1 C) (12/18 0524) Temp src: Oral (12/18 0524) BP: 141/71 mmHg (12/18 0524) Pulse Rate: 70  (12/18 0524) Intake/Output from previous day: 12/17 0701 - 12/18 0700 In: 930 [P.O.:480; IV Piggyback:450] Out: 200 [Urine:200] Intake/Output from this shift:    Labs:  Basename 06/08/12 0500 06/07/12 2127 06/07/12 1950  WBC -- -- 10.5  HGB -- -- 10.2*  PLT -- -- 298  LABCREA -- -- --  CREATININE 0.87 0.87 --   Estimated Creatinine Clearance: 105.6 ml/min (by C-G formula based on Cr of 0.87).  Basename 06/08/12 0915 06/07/12 2127  VANCOTROUGH 19.0 23.6*  VANCOPEAK -- --  Drue Dun -- --  GENTTROUGH -- --  GENTPEAK -- --  GENTRANDOM -- --  TOBRATROUGH -- --  TOBRAPEAK -- --  TOBRARND -- --  AMIKACINPEAK -- --  AMIKACINTROU -- --  AMIKACIN -- --     Microbiology: Recent Results (from the past 720 hour(s))  CULTURE, BLOOD (ROUTINE X 2)     Status: Normal (Preliminary result)   Collection Time   06/04/12 12:50 AM      Component Value Range Status Comment   Specimen Description BLOOD RIGHT ARM   Final    Special Requests BOTTLES DRAWN AEROBIC AND ANAEROBIC 5CC BOTH   Final    Culture  Setup Time 06/04/2012 15:39   Final    Culture     Final    Value:        BLOOD CULTURE RECEIVED NO GROWTH TO DATE CULTURE WILL BE HELD FOR 5 DAYS BEFORE ISSUING A FINAL NEGATIVE REPORT   Report Status PENDING   Incomplete   CULTURE, BLOOD (ROUTINE X 2)     Status: Normal (Preliminary result)   Collection Time   06/04/12  1:15 AM      Component Value Range Status Comment   Specimen Description BLOOD RIGHT ARM   Final    Special Requests      Final    Value: BOTTLES DRAWN AEROBIC AND ANAEROBIC 3CC AEB, 2CC ANA   Culture  Setup Time 06/04/2012 15:39   Final    Culture     Final    Value:        BLOOD CULTURE RECEIVED NO GROWTH TO DATE CULTURE WILL BE HELD FOR 5 DAYS BEFORE ISSUING A FINAL NEGATIVE REPORT   Report Status PENDING   Incomplete     Anti-infectives     Start     Dose/Rate Route Frequency Ordered Stop   06/08/12 1200   vancomycin (VANCOCIN) 1,250 mg in sodium chloride 0.9 % 250 mL IVPB        1,250 mg 166.7 mL/hr over 90 Minutes Intravenous Every 12 hours 06/08/12 1017     06/07/12 1200   Ampicillin-Sulbactam (UNASYN) 3 g in sodium chloride 0.9 % 100 mL IVPB        3 g 100 mL/hr over 60 Minutes Intravenous 4 times per day 06/07/12 0834     06/05/12 2200   vancomycin (VANCOCIN) 1,250 mg in sodium chloride 0.9 % 250 mL IVPB  Status:  Discontinued        1,250 mg 166.7 mL/hr over  90 Minutes Intravenous Every 12 hours 06/05/12 2026 06/08/12 0334   06/03/12 2315   ampicillin-sulbactam (UNASYN) 1.5 g in sodium chloride 0.9 % 50 mL IVPB  Status:  Discontinued        1.5 g 100 mL/hr over 30 Minutes Intravenous 4 times per day 06/03/12 2251 06/07/12 0834   06/03/12 2000   vancomycin (VANCOCIN) 1,500 mg in sodium chloride 0.9 % 500 mL IVPB  Status:  Discontinued        1,500 mg 250 mL/hr over 120 Minutes Intravenous Every 12 hours 06/03/12 1934 06/05/12 2025   06/03/12 1830   clindamycin (CLEOCIN) IVPB 600 mg        600 mg 100 mL/hr over 30 Minutes Intravenous  Once 06/03/12 1819 06/03/12 2115          Assessment:  61 YOF admitted with bilateral cellulitis  Day #6 Vancomycin and Unasyn  Renal function remains stable, WBC normal, febrile to 101.1 on 12/16, afebrile this am  Vanc trough last night (23.6) is not valid since it was drawn after dose was hung  Trough slightly above goal this am (19) on 1250mg  q12h  Goal of Therapy:  Vancomycin trough level 10-15 mcg/ml  Plan:   Decrease vancomycin to 1g  q12h Follow up renal function & cultures Follow up clinical resolution of symptoms, de-escalation of antibiotics  Loralee Pacas, PharmD, BCPS Pager: [phone] 06/08/2012,10:19 AM

## 2012-06-08 NOTE — Progress Notes (Signed)
Pt. Had a 4.02 second pause at 0253. Her hx indicates that she has had a >2 second pause last night. The patient is asymptomatic. Vitals are stable. I placed 2L of O2 on patient. Patient informed me she usually wears CPAP at night but didn't think to tell anyone. Notified Floor Coverage at 0300. No new orders given at this time. Will continue to monitor.

## 2012-06-08 NOTE — Discharge Summary (Signed)
Physician Discharge Summary  [name] WUX:[id number] DOB: 1951-03-10 DOA: 06/03/2012  PCP: Alva Garnet., MD  Admit date: 06/03/2012 Discharge date: 06/08/2012  Time spent: >30 minutes  Recommendations for Outpatient Follow-up:  -Follow with PCP in 2 weeks -Arrange follow up with cardiology (atrial fib with RVR and sustained irregular rhythm; now on xarelto) -Follow up with wound care -Needs CBC and BMET during follow up to make sure Hgb and also kidney function/electrolytes remains stable.  Discharge Diagnoses:  Principal Problem:  *Lower extremity cellulitis Active Problems:  BMI 70 and over, adult  HTN (hypertension)  Sepsis  Lactic acidosis  Obstructive sleep apnea on CPAP  PAF (paroxysmal atrial fibrillation)   Discharge Condition: stable and improved. No fever, no CP, no SOB. Patient will take medications as prescribed and will follow with cardiology and PCP as an outpatient. She will also arrange follow up at wound care center for further outpatient treatment of her chronic LE ulcers.  Diet recommendation: heart healthy and low calorie diet.  Filed Weights   06/06/12 0549 06/07/12 0523 06/08/12 0524  Weight: 173 kg (381 lb 6.3 oz) 171.7 kg (378 lb 8.5 oz) 174.1 kg (383 lb 13.1 oz)    History of present illness:  61 y.o. female with past medical history of hypertension, morbid obesity, and history of atrial fibrillation not on Coumadin that comes in for lower extremity redness erythema swelling and purulent drainage of lower extremities bilaterally. She says she was doing fine until 2 days ago when she noticed the redness. [facility] and [facility] this morning and was told to come to the ED. Here in the ED she was found to have an elevated white count with lactic 2.4. Her blood pressure has remained stable.   Hospital Course:  1-Sepsis/ Lactic acidosis  - Secondary to her lower extremity cellulitis. Now pretty much resolved.  Patient afebrile, WBC;s WNL and lactic acid also WNL. Will transition abx's to by mouth and she will take it for a total of 10 days.  -Continue wound care   2-Lower extremity cellulitis:  vancomycin and Unasyn started on 06/03/2012, . Wound care consult requested while inside the hospital. -Patient responded well to treatment and at this moment is having just mild drainage from her wounds. Blood cultures negative. -Will transition to PO bactrim and she will follow at wound care center and continue receiving wound nurses at home [facility].   3-A. fib with RVR: most likely triggered by sepsis - Rate now controlled  -Will continue bisoprolol -Since CHADS score 2 and she experienced RVR and has remain ed irregular rhythm will start her on xarelto and she will follow with cardiology as an outpatient.  4-BMI 70 and over, adult : patient advised to follow low calorie diet  5-HTN (hypertension):stable. Will continue current regimen and low sodium diet.  6-OSA: continue CPAP.  Rest of her medical problems remains stable and the plan is to continue current medication regimen. Patient will follow with PCP in order to have medication adjusted as needed and to follow her chronic medical problems.  Procedures:  See below  Consultations:  WOC  Discharge Exam: Filed Vitals:   06/07/12 2157 06/08/12 0315 06/08/12 0524 06/08/12 1020  BP: 147/83 137/75 141/71 141/73  Pulse: 93 90 70 90  Temp: 99.1 F (37.3 C) 98 F (36.7 C) 98.7 F (37.1 C)   TempSrc: Oral Oral Oral   Resp: 20 20 19    Height:  Weight:   174.1 kg (383 lb 13.1 oz)   SpO2: 93% 97% 100%    General: Alert, afebrile, comfortable  Cardiovascular: s1s2, irregular, tachycardic.  Respiratory: no wheezing, no crackles, good air movement overall.   Abdomen: obese, soft NT ND BS+  Extremities: bilateral lower extremity cellulitis, erythema is better, clean dressings in palce  Neuro: non focal.  Discharge  Instructions  Discharge Orders    Future Orders Please Complete By Expires   Diet - low sodium heart healthy      Increase activity slowly      Discharge instructions      Comments:   -Arrange follow up with PCP in 2 weeks -Take medications as prescribed -Keep yourself well hydrated -Follow wound care instructions and recommendations.       Medication List     As of 06/08/2012  3:46 PM    TAKE these medications         albuterol 108 (90 BASE) MCG/ACT inhaler   Commonly known as: PROVENTIL HFA;VENTOLIN HFA   Inhale 1 puff into the lungs 4 (four) times daily.      aspirin 325 MG tablet   Take 325 mg by mouth daily.      citalopram 10 MG tablet   Commonly known as: CELEXA   Take 10 mg by mouth daily.      diclofenac sodium 1 % Gel   Commonly known as: VOLTAREN   Apply 1 application topically 4 (four) times daily.      lisinopril-hydrochlorothiazide 20-25 MG per tablet   Commonly known as: PRINZIDE,ZESTORETIC   Take 1 tablet by mouth daily.      nebivolol 10 MG tablet   Commonly known as: BYSTOLIC   Take 10 mg by mouth daily.      oxyCODONE-acetaminophen 5-325 MG per tablet   Commonly known as: PERCOCET/ROXICET   Take 1-2 tablets by mouth every 6 (six) hours as needed. Pain      Rivaroxaban 20 MG Tabs   Commonly known as: XARELTO   Take 1 tablet (20 mg total) by mouth daily with supper.      sulfamethoxazole-trimethoprim 800-160 MG per tablet   Commonly known as: BACTRIM DS,SEPTRA DS   Take 1 tablet by mouth 2 (two) times daily.      VITAMIN B 12 PO   Take 1,000 mcg by mouth daily.      Vitamin D-3 5000 UNITS Tabs   Take 1 capsule by mouth daily.      zinc gluconate 50 MG tablet   Take 50 mg by mouth daily.            Follow-up Information    Follow up with Alva Garnet., MD. Schedule an appointment as soon as possible for a visit in 2 weeks.   Contact information:   [address] [place] [address] [phone]       Call  [facility]. (CALL OFFICE TO SET UP APPOINTMENT IN 2 WEEKS.)    Contact information:   [address] [place] [address] [phone]          The results of significant diagnostics from this hospitalization (including imaging, microbiology, ancillary and laboratory) are listed below for reference.    Significant Diagnostic Studies: Dg Chest 2 View  06/07/2012  *RADIOLOGY REPORT*  Clinical Data: Fever, weakness  CHEST - 2 VIEW  Comparison: 09/07/2011  Findings: Cardiomegaly with pulmonary vascular congestion and possible mild  interstitial edema.  Opacity overlying the right lower lung likely reflects overlying soft tissue.  Degenerative changes of the visualized thoracolumbar spine.  IMPRESSION: Cardiomegaly with pulmonary vascular congestion and possible mild interstitial edema.   Original Report Authenticated By: Charline Bills, M.D.     Microbiology: Recent Results (from the past 240 hour(s))  CULTURE, BLOOD (ROUTINE X 2)     Status: Normal (Preliminary result)   Collection Time   06/04/12 12:50 AM      Component Value Range Status Comment   Specimen Description BLOOD RIGHT ARM   Final    Special Requests BOTTLES DRAWN AEROBIC AND ANAEROBIC 5CC BOTH   Final    Culture  Setup Time 06/04/2012 15:39   Final    Culture     Final    Value:        BLOOD CULTURE RECEIVED NO GROWTH TO DATE CULTURE WILL BE HELD FOR 5 DAYS BEFORE ISSUING A FINAL NEGATIVE REPORT   Report Status PENDING   Incomplete   CULTURE, BLOOD (ROUTINE X 2)     Status: Normal (Preliminary result)   Collection Time   06/04/12  1:15 AM      Component Value Range Status Comment   Specimen Description BLOOD RIGHT ARM   Final    Special Requests     Final    Value: BOTTLES DRAWN AEROBIC AND ANAEROBIC 3CC AEB, 2CC ANA   Culture  Setup Time 06/04/2012 15:39   Final    Culture     Final    Value:        BLOOD CULTURE RECEIVED NO GROWTH TO DATE CULTURE WILL BE HELD  FOR 5 DAYS BEFORE ISSUING A FINAL NEGATIVE REPORT   Report Status PENDING   Incomplete      Labs: Basic Metabolic Panel:  Lab 06/08/12 1610 06/07/12 2127 06/05/12 0525 06/04/12 0534 06/03/12 1715  NA 136 -- 136 134* 135  K 3.6 -- 3.8 3.6 3.4*  CL 103 -- 103 103 101  CO2 23 -- 22 23 21   GLUCOSE 99 -- 100* 96 94  BUN 10 -- 10 9 12   CREATININE 0.87 0.87 0.84 0.80 0.87  CALCIUM 8.7 -- 8.6 8.4 9.0  MG -- -- 2.0 -- --  PHOS -- -- -- -- --   Liver Function Tests:  Lab 06/04/12 0534  AST 8  ALT 5  ALKPHOS 87  BILITOT 0.4  PROT 6.4  ALBUMIN 2.1*   CBC:  Lab 06/07/12 1950 06/05/12 0525 06/04/12 0534 06/03/12 1715  WBC 10.5 10.5 9.3 13.0*  NEUTROABS -- -- -- 9.6*  HGB 10.2* 10.0* 10.0* 10.6*  HCT 31.5* 31.2* 31.0* 32.4*  MCV 91.8 92.6 92.3 91.8  PLT 298 277 272 315    Signed:  Sevana Grandinetti  [facility] 06/08/2012, 3:18 PM

## 2012-06-10 LAB — CULTURE, BLOOD (ROUTINE X 2)

## 2012-07-22 ENCOUNTER — Telehealth (INDEPENDENT_AMBULATORY_CARE_PROVIDER_SITE_OTHER): Payer: Self-pay

## 2012-07-22 NOTE — Telephone Encounter (Signed)
Verified w/ HH nurse that Dr. Abbey Chatters would sign patient's paperwork when he is in the office on 07/25/12.  We will then fax.

## 2013-03-08 ENCOUNTER — Emergency Department (HOSPITAL_COMMUNITY): Payer: Medicare Other

## 2013-03-08 ENCOUNTER — Encounter (HOSPITAL_COMMUNITY): Payer: Self-pay

## 2013-03-08 ENCOUNTER — Inpatient Hospital Stay (HOSPITAL_COMMUNITY)
Admission: EM | Admit: 2013-03-08 | Discharge: 2013-03-13 | DRG: 603 | Disposition: A | Discharge status: Skilled Nursing Facility | Payer: Medicare Other | Attending: Internal Medicine | Admitting: Internal Medicine

## 2013-03-08 DIAGNOSIS — L03113 Cellulitis of right upper limb: Secondary | ICD-10-CM

## 2013-03-08 DIAGNOSIS — D72829 Elevated white blood cell count, unspecified: Secondary | ICD-10-CM

## 2013-03-08 DIAGNOSIS — E785 Hyperlipidemia, unspecified: Secondary | ICD-10-CM | POA: Diagnosis present

## 2013-03-08 DIAGNOSIS — W19XXXA Unspecified fall, initial encounter: Secondary | ICD-10-CM

## 2013-03-08 DIAGNOSIS — Z6841 Body Mass Index (BMI) 40.0 and over, adult: Secondary | ICD-10-CM

## 2013-03-08 DIAGNOSIS — L03129 Acute lymphangitis of unspecified part of limb: Secondary | ICD-10-CM

## 2013-03-08 DIAGNOSIS — N61 Mastitis without abscess: Secondary | ICD-10-CM

## 2013-03-08 DIAGNOSIS — N390 Urinary tract infection, site not specified: Secondary | ICD-10-CM

## 2013-03-08 DIAGNOSIS — E872 Acidosis, unspecified: Secondary | ICD-10-CM

## 2013-03-08 DIAGNOSIS — I872 Venous insufficiency (chronic) (peripheral): Secondary | ICD-10-CM | POA: Diagnosis present

## 2013-03-08 DIAGNOSIS — I4891 Unspecified atrial fibrillation: Secondary | ICD-10-CM | POA: Diagnosis present

## 2013-03-08 DIAGNOSIS — Z87891 Personal history of nicotine dependence: Secondary | ICD-10-CM

## 2013-03-08 DIAGNOSIS — R6510 Systemic inflammatory response syndrome (SIRS) of non-infectious origin without acute organ dysfunction: Secondary | ICD-10-CM | POA: Diagnosis present

## 2013-03-08 DIAGNOSIS — E8729 Other acidosis: Secondary | ICD-10-CM

## 2013-03-08 DIAGNOSIS — I480 Paroxysmal atrial fibrillation: Secondary | ICD-10-CM

## 2013-03-08 DIAGNOSIS — N179 Acute kidney failure, unspecified: Secondary | ICD-10-CM

## 2013-03-08 DIAGNOSIS — Z79899 Other long term (current) drug therapy: Secondary | ICD-10-CM

## 2013-03-08 DIAGNOSIS — R627 Adult failure to thrive: Secondary | ICD-10-CM | POA: Diagnosis present

## 2013-03-08 DIAGNOSIS — L0291 Cutaneous abscess, unspecified: Secondary | ICD-10-CM

## 2013-03-08 DIAGNOSIS — G4733 Obstructive sleep apnea (adult) (pediatric): Secondary | ICD-10-CM

## 2013-03-08 DIAGNOSIS — L0390 Cellulitis, unspecified: Secondary | ICD-10-CM

## 2013-03-08 DIAGNOSIS — I10 Essential (primary) hypertension: Secondary | ICD-10-CM

## 2013-03-08 DIAGNOSIS — R269 Unspecified abnormalities of gait and mobility: Secondary | ICD-10-CM | POA: Diagnosis present

## 2013-03-08 DIAGNOSIS — F3289 Other specified depressive episodes: Secondary | ICD-10-CM | POA: Diagnosis present

## 2013-03-08 DIAGNOSIS — F329 Major depressive disorder, single episode, unspecified: Secondary | ICD-10-CM | POA: Diagnosis present

## 2013-03-08 DIAGNOSIS — L02419 Cutaneous abscess of limb, unspecified: Principal | ICD-10-CM | POA: Diagnosis present

## 2013-03-08 DIAGNOSIS — M1710 Unilateral primary osteoarthritis, unspecified knee: Secondary | ICD-10-CM | POA: Diagnosis present

## 2013-03-08 DIAGNOSIS — A419 Sepsis, unspecified organism: Secondary | ICD-10-CM

## 2013-03-08 DIAGNOSIS — E860 Dehydration: Secondary | ICD-10-CM | POA: Diagnosis present

## 2013-03-08 DIAGNOSIS — L03119 Cellulitis of unspecified part of limb: Secondary | ICD-10-CM

## 2013-03-08 DIAGNOSIS — R921 Mammographic calcification found on diagnostic imaging of breast: Secondary | ICD-10-CM

## 2013-03-08 DIAGNOSIS — B961 Klebsiella pneumoniae [K. pneumoniae] as the cause of diseases classified elsewhere: Secondary | ICD-10-CM | POA: Diagnosis present

## 2013-03-08 DIAGNOSIS — Z23 Encounter for immunization: Secondary | ICD-10-CM

## 2013-03-08 DIAGNOSIS — F411 Generalized anxiety disorder: Secondary | ICD-10-CM | POA: Diagnosis present

## 2013-03-08 LAB — URINALYSIS, ROUTINE W REFLEX MICROSCOPIC
Bilirubin Urine: NEGATIVE
Glucose, UA: NEGATIVE mg/dL
Ketones, ur: NEGATIVE mg/dL
Nitrite: POSITIVE — AB
pH: 5.5 (ref 5.0–8.0)

## 2013-03-08 LAB — COMPREHENSIVE METABOLIC PANEL
ALT: 16 U/L (ref 0–35)
Alkaline Phosphatase: 121 U/L — ABNORMAL HIGH (ref 39–117)
CO2: 16 mEq/L — ABNORMAL LOW (ref 19–32)
GFR calc Af Amer: 39 mL/min — ABNORMAL LOW (ref >90)
Glucose, Bld: 102 mg/dL — ABNORMAL HIGH (ref 70–99)
Potassium: 3.7 mEq/L (ref 3.5–5.1)
Sodium: 132 mEq/L — ABNORMAL LOW (ref 135–145)
Total Protein: 7.2 g/dL (ref 6.0–8.3)

## 2013-03-08 LAB — CBC WITH DIFFERENTIAL/PLATELET
Eosinophils Absolute: 0.1 10*3/uL (ref 0.0–0.7)
Lymphocytes Relative: 8 % — ABNORMAL LOW (ref 12–46)
Lymphs Abs: 1.3 10*3/uL (ref 0.7–4.0)
Neutrophils Relative %: 86 % — ABNORMAL HIGH (ref 43–77)
Platelets: 344 10*3/uL (ref 150–400)
RBC: 3.63 MIL/uL — ABNORMAL LOW (ref 3.87–5.11)
WBC: 16.8 10*3/uL — ABNORMAL HIGH (ref 4.0–10.5)

## 2013-03-08 LAB — PHOSPHORUS: Phosphorus: 3.3 mg/dL (ref 2.3–4.6)

## 2013-03-08 MED ORDER — VANCOMYCIN HCL 10 G IV SOLR
1500.0000 mg | Freq: Two times a day (BID) | INTRAVENOUS | Status: DC
  Administered 2013-03-09: 07:00:00 1500 mg via INTRAVENOUS
  Filled 2013-03-08: qty 1500

## 2013-03-08 MED ORDER — VANCOMYCIN HCL 10 G IV SOLR
2500.0000 mg | INTRAVENOUS | Status: AC
  Administered 2013-03-08: 2500 mg via INTRAVENOUS
  Filled 2013-03-08: qty 2500

## 2013-03-08 MED ORDER — SODIUM CHLORIDE 0.9 % IV SOLN
1000.0000 mL | Freq: Once | INTRAVENOUS | Status: AC
  Administered 2013-03-08: 1000 mL via INTRAVENOUS

## 2013-03-08 MED ORDER — HYDROCODONE-ACETAMINOPHEN 5-325 MG PO TABS
1.0000 | ORAL_TABLET | ORAL | Status: DC | PRN
  Administered 2013-03-09 – 2013-03-13 (×11): 2 via ORAL
  Filled 2013-03-08 (×11): qty 2

## 2013-03-08 MED ORDER — DEXTROSE 5 % IV SOLN
1.0000 g | INTRAVENOUS | Status: DC
  Administered 2013-03-08 – 2013-03-11 (×4): 1 g via INTRAVENOUS
  Filled 2013-03-08 (×5): qty 10

## 2013-03-08 MED ORDER — DIPHENHYDRAMINE HCL 25 MG PO CAPS: 25.0000 mg | ORAL_CAPSULE | Freq: Four times a day (QID) | ORAL | Status: DC | PRN

## 2013-03-08 MED ORDER — SODIUM CHLORIDE 0.9 % IJ SOLN
3.0000 mL | Freq: Two times a day (BID) | INTRAMUSCULAR | Status: DC
  Administered 2013-03-11 – 2013-03-13 (×4): 3 mL via INTRAVENOUS

## 2013-03-08 MED ORDER — VANCOMYCIN HCL IN DEXTROSE 1-5 GM/200ML-% IV SOLN
1000.0000 mg | INTRAVENOUS | Status: DC
  Filled 2013-03-08: qty 200

## 2013-03-08 MED ORDER — ONDANSETRON HCL 4 MG/2ML IJ SOLN: 4.0000 mg | Freq: Four times a day (QID) | INTRAMUSCULAR | Status: DC | PRN

## 2013-03-08 MED ORDER — ASPIRIN 325 MG PO TABS
325.0000 mg | ORAL_TABLET | Freq: Every day | ORAL | Status: DC
  Administered 2013-03-09 – 2013-03-13 (×5): 325 mg via ORAL
  Filled 2013-03-08 (×5): qty 1

## 2013-03-08 MED ORDER — ENOXAPARIN SODIUM 40 MG/0.4ML ~~LOC~~ SOLN
40.0000 mg | Freq: Every day | SUBCUTANEOUS | Status: DC
  Filled 2013-03-08 (×2): qty 0.4

## 2013-03-08 MED ORDER — ONDANSETRON HCL 4 MG PO TABS: 4.0000 mg | ORAL_TABLET | Freq: Four times a day (QID) | ORAL | Status: DC | PRN

## 2013-03-08 MED ORDER — SODIUM CHLORIDE 0.9 % IV SOLN
1000.0000 mL | INTRAVENOUS | Status: DC
  Administered 2013-03-08: 1000 mL via INTRAVENOUS

## 2013-03-08 MED ORDER — GABAPENTIN 300 MG PO CAPS
300.0000 mg | ORAL_CAPSULE | Freq: Every evening | ORAL | Status: DC | PRN
  Filled 2013-03-08: qty 1

## 2013-03-08 MED ORDER — NEBIVOLOL HCL 5 MG PO TABS
5.0000 mg | ORAL_TABLET | Freq: Every day | ORAL | Status: DC
  Administered 2013-03-09 – 2013-03-13 (×5): 5 mg via ORAL
  Filled 2013-03-08 (×5): qty 1

## 2013-03-08 MED ORDER — SODIUM CHLORIDE 0.9 % IV SOLN
INTRAVENOUS | Status: DC
  Administered 2013-03-09 – 2013-03-12 (×3): via INTRAVENOUS

## 2013-03-08 MED ORDER — CITALOPRAM HYDROBROMIDE 10 MG PO TABS
10.0000 mg | ORAL_TABLET | Freq: Every day | ORAL | Status: DC
  Administered 2013-03-09 – 2013-03-13 (×5): 10 mg via ORAL
  Filled 2013-03-08 (×5): qty 1

## 2013-03-08 NOTE — ED Notes (Signed)
Floor Unit RN was given report on pt--- Floor Unit RN will obtain bariatric bed for pt, will call when ready.

## 2013-03-08 NOTE — ED Notes (Signed)
Bed: UJ81 Expected date:  Expected time:  Means of arrival:  Comments: EMS-knee pain-fall

## 2013-03-08 NOTE — H&P (Addendum)
[facility] History and Physical  [name] ZOX:[id number] DOB: [birth year] DOA: 03/08/2013  Referring physician: ED physician PCP: Marletta Lor, NP   Chief Complaint: fall and lower extremity weakness   HPI:  Pt is 62 yo female with HTN, HLD, morbid obesity who presents to [facility] ED with main concern of several episodes of fall at home over the past few weeks. The last episode occurred earlier prior to this admission. Pt explains she was in the bathroom and felt like her legs were "giving out". She reports associated subjective fevers, chill, lower extremity swelling and redness, malaise, poor oral intake. She also explains she felt like she had stomach virus few days ago as she was having stomach cramps, muscle aches, diarrhea and vomiting but this appears to have resolved at this point. She denies chest pain or shortness of breath, no specific abdominal concerns at this time, no specific urinary concerns except urinary urgency.   In ED, pt hemodynamically stable, electrolyte panel with mild anion gap acidosis of 14, leukocytosis with WBC 16, acute renal failure. TR[facility] asked to admit for further evaluation of recurrent falls, management of UTI and lower extremity cellulitis.   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 and now somewhat worse - will treat with Vancomycin and narrow down as clinically indicated   Acute renal failure - most likely pre renal in etiology - will provide IVF and repeat BMP in AM   Leukocytosis - secondary to UTI and cellulitis - ABX as noted  above - CBC in AM   UTI (lower urinary tract infection) - Rocephin and follow up on urine cultures    Metabolic acidosis, increased anion gap - secondary to dehydration and SIRS as noted above - IVF and supportive care, repeat BMP in 4 hours and again in AM   BMI 70 and over, adult - nutrition consultation    HTN (hypertension) - stable BP on admission, continue home medical regimen    Obstructive sleep apnea on CPAP - CPAP at night    PAF (paroxysmal atrial fibrillation) - rate controlled, follow on telemetry    Wound, right upper breast area - Antibiotics as noted above and wound care consult  Code Status: Full Family Communication: Pt at bedside Disposition Plan: Admit to telemetry bed   Review of Systems:  Constitutional: Positive for fever, chills and malaise/fatigue. Negative for diaphoresis.  HENT: Negative for hearing loss, ear pain, nosebleeds, congestion, sore throat, neck pain, tinnitus and ear discharge.   Eyes: Negative for blurred vision, double vision, photophobia, pain, discharge and redness.  Respiratory: Negative for cough, hemoptysis, sputum production, shortness of breath, wheezing and stridor.   Cardiovascular: Negative for chest pain, palpitations, orthopnea, claudication.  Gastrointestinal: Negative for nausea, vomiting and abdominal pain. Negative for heartburn, constipation, blood in stool and melena.  Genitourinary: Negative for dysuria, urgency, hematuria and flank pain.  Musculoskeletal: Negative for myalgias, joint pain.  Skin: Negative for itching and rash.  Neurological: Negative for tingling, tremors, sensory change, speech change, focal weakness, loss of consciousness and headaches.  Endo/Heme/Allergies: Negative for environmental allergies  and polydipsia. Does not bruise/bleed easily.  Psychiatric/Behavioral: Negative for suicidal ideas. The patient is not nervous/anxious.      Past Medical History  Diagnosis Date  . Obesities, morbid   .  Hypertension   . Cellulitis     right breast, right arm, ble  . Depression   . Anxiety   . 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 Obstructive sleep apnea     CPAP  . Breast cancer 1988  . Dysrhythmia   . Obstructive sleep apnea on CPAP 06/04/2012  . PAF (paroxysmal atrial fibrillation) 06/04/2012    Past Surgical History  Procedure Laterality Date  . Right lumpectomy  1988  . Abdominal hysterectomy  1991  . Right carpal tunnel release  1995  . Axillary lymph node dissection  1988  . Wound debridement  09/03/2011    Procedure: DEBRIDEMENT WOUND;  Surgeon: Adolph Pollack, MD;  Location: [facility] ORS;  Service: General;  Laterality: Right;    Social History:  reports that she quit smoking about 8 years ago. Her smoking use included Cigarettes. She has a 20 pack-year smoking history. She has never used smokeless tobacco. She reports that she does not drink alcohol or use illicit drugs.  Allergies  Allergen Reactions  . Effexor [Venlafaxine Hydrochloride] Anxiety    Made her want to kill herself    Family History  Problem Relation Age of Onset  . Hypertension    . Other Mother   . Other Father     Prior to Admission medications   Medication Sig Start Date End Date Taking? Authorizing Provider  aspirin 325 MG tablet Take 325 mg by mouth daily.   Yes Historical Provider, MD  Cholecalciferol (VITAMIN D-3) 5000 UNITS TABS Take 1 capsule by mouth daily.   Yes Historical Provider, MD  citalopram (CELEXA) 10 MG tablet Take 10 mg by mouth daily.   Yes Historical Provider, MD  diphenhydrAMINE (BENADRYL) 25 mg capsule Take 25 mg by mouth every 6 (six) hours as needed for allergies.   Yes Historical Provider, MD  gabapentin (NEURONTIN) 300 MG capsule Take 300 mg by mouth at bedtime as needed (nerve pain).    Yes Historical Provider, MD  nebivolol (BYSTOLIC) 10 MG tablet Take 5 mg by mouth  daily.    Yes Historical Provider, MD    Physical Exam: Filed Vitals:   03/08/13 1433 03/08/13 1703  BP: 95/56 142/99  Pulse: 90 88  Temp: 97.6 F (36.4 C)   TempSrc: Oral   Resp: 20 20  SpO2: 99% 97%    Physical Exam  Constitutional: Appears well-developed and well-nourished. No distress. Morbidly obese  HENT: Normocephalic. External right and left ear normal. Oropharynx is clear and moist.  Eyes: Conjunctivae and EOM are normal. PERRLA, no scleral icterus.  Neck: Normal ROM. Neck supple. No JVD. No tracheal deviation. No thyromegaly.  CVS: Irregular rate and rhythm, no murmurs, no gallops, no carotid bruit.  Pulmonary: Diminished breath sounds bilaterally mostly due to body habitus, no wheezing or rhonchi   Abdominal: Soft. BS +,  no distension, tenderness, rebound or guarding.  Musculoskeletal: Bilateral lower extremity venous stasis dermatitis with erythema and tenderness to palpation, warmth to touch extending to mid thighs   Lymphadenopathy: No lymphadenopathy noted, cervical, inguinal. Neuro: Alert. Normal reflexes, muscle tone coordination. No cranial nerve deficit. Skin: Pannus, abdominal area, erythema,  right upper breast area with open wound and packing, some drainage noted, no tenderness to palpation  Psychiatric: Normal mood and affect. Behavior, judgment, thought content normal.   Labs on Admission:  Basic Metabolic Panel:  Recent Labs Lab 03/08/13 1555  NA 132*  K 3.7  CL 101  CO2 16*  GLUCOSE 102*  BUN 32*  CREATININE 1.57*  CALCIUM 8.7   Liver Function Tests:  Recent Labs Lab 03/08/13 1555  AST 15  ALT 16  ALKPHOS 121*  BILITOT 0.3  PROT 7.2  ALBUMIN 1.8*   CBC:  Recent Labs Lab 03/08/13 1555  WBC 16.8*  NEUTROABS 14.4*  HGB 11.0*  HCT 33.3*  MCV 91.7  PLT 344    Radiological Exams on Admission: Dg Chest Port 1 View  03/08/2013   CLINICAL DATA:  Cough and shortness of breast; recent trauma  EXAM: PORTABLE CHEST - 1 VIEW   COMPARISON:  September 07, 2011 and June 07, 2012  FINDINGS: There is cardiomegaly with mild pulmonary venous hypertension. There is no frank edema or consolidation. No appreciable effusions. No adenopathy. There are surgical clips in the right axilla.  IMPRESSION: No edema or consolidation. There is, however, cardiomegaly with a degree of pulmonary venous hypertension. Question early volume overload.   Electronically Signed   By: Bretta Bang   On: 03/08/2013 18:30    EKG: Normal sinus rhythm, no ST/T wave changes  Debbora Presto, MD  [facility] Pager [phone]  If 7PM-7AM, please contact night-coverage [email or web address] Password [facility] 03/08/2013, 6:45 PM

## 2013-03-08 NOTE — Progress Notes (Signed)
Attempted to call ED for report. Pt NS stated they are in the middle of shift change, on hold for 10 minutes. Will try back. [name] A

## 2013-03-08 NOTE — ED Notes (Signed)
She phoned EMS today because "I've been falling lately--my knees are giving out".  She is oriented x 4 with clear speech.

## 2013-03-08 NOTE — Progress Notes (Addendum)
ANTIBIOTIC CONSULT NOTE - INITIAL  Pharmacy Consult for vancomycin Indication: cellulitis  Allergies  Allergen Reactions  . Effexor [Venlafaxine Hydrochloride] Anxiety    Made her want to kill herself    Patient Measurements:   Adjusted Body Weight:   Vital Signs: Temp: 97.6 F (36.4 C) (09/17 1433) Temp src: Oral (09/17 1433) BP: 95/56 mmHg (09/17 1433) Pulse Rate: 90 (09/17 1433) Intake/Output from previous day:   Intake/Output from this shift:    Labs: No results found for this basename: WBC, HGB, PLT, LABCREA, CREATININE,  in the last 72 hours The CrCl is unknown because both a height and weight (above a minimum accepted value) are required for this calculation. No results found for this basename: VANCOTROUGH, VANCOPEAK, VANCORANDOM, GENTTROUGH, GENTPEAK, GENTRANDOM, TOBRATROUGH, TOBRAPEAK, TOBRARND, AMIKACINPEAK, AMIKACINTROU, AMIKACIN,  in the last 72 hours   Microbiology: No results found for this or any previous visit (from the past 720 hour(s)).  Medical History: Past Medical History  Diagnosis Date  . Obesities, morbid   . Hypertension   . Cellulitis     right breast, right arm, ble  . Depression   . Anxiety   . 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 Obstructive sleep apnea     CPAP  . Breast cancer 1988  . Dysrhythmia   . Obstructive sleep apnea on CPAP 06/04/2012  . PAF (paroxysmal atrial fibrillation) 06/04/2012   Assessment: 73 YOF presents with frequent falls, per physician's physical exam, patient is noted to be morbidly obese with stool/mucus between legs as well as L leg erythema.  Both lower extremities noted to have edema. No labs currently available.   9/17 >>vancomycin  >>  Tmax: afebrile WBCs: Renal:  / blood: / urine:  / sputum:    Goal of Therapy:  Vancomycin trough level 10-15 mcg/ml  Plan:   Orders for vancomycin 1gm  now, will need additional 1.5gm given after first 1gm dose given  Await labs to be drawn to determine maintenance dosing  Juliette Alcide, PharmD, BCPS.   Pager: [phone]  03/08/2013,4:03 PM   ADDENDUM:   SCr returned has returned = 1.57 for est CrCl ~33ml/min  Plan: Start vancomycin 1500mg  IV q12h, check steady state trough  Juliette Alcide, PharmD, BCPS.   Pager: [phone] 03/08/2013 5:01 PM

## 2013-03-08 NOTE — ED Provider Notes (Signed)
CSN: [id number]     Arrival date & time 03/08/13  1427 History  First MD Initiated Contact with Patient 03/08/13 1512     Chief Complaint  Patient presents with  . Fall    HPI Pt has falled 6 times in the last week because her knees are giving out.  She had another episode today wheere she fell in the bathroom.  Pt had to call the paramedics.  Pt states her knees give out because they are painful but she also gets weak.  She feels like her coordination is off.  She feels like she gets tongue tied sometimes.  She has felt feverish as well over the last few days.  Her last fever was Monday and she was attributing her symptoms to a stomach virus where she had vomiting and diarrhea.  No vomiting or diarrhea today.  No cough. She often has cellulitis of her legs   Normally she can walk with a walker but she was not able to since yesterday.  WHen she finally tired to get up this am she fell to the floor. Past Medical History  Diagnosis Date  . Obesities, morbid   . Hypertension   . Cellulitis     right breast, right arm, ble  . Depression   . Anxiety   . 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 Obstructive sleep apnea     CPAP  . Breast cancer 1988  . Dysrhythmia   . Obstructive sleep apnea on CPAP 06/04/2012  . PAF (paroxysmal atrial fibrillation) 06/04/2012   Past Surgical History  Procedure Laterality Date  . Right lumpectomy  1988  . Abdominal hysterectomy  1991  . Right carpal tunnel release  1995  . Axillary lymph node dissection  1988  . Wound debridement  09/03/2011    Procedure: DEBRIDEMENT WOUND;  Surgeon: Adolph Pollack, MD;  Location: [facility];  Service: General;  Laterality: Right;   Family History  Problem Relation Age of Onset  . Hypertension    . Other Mother   . Other Father    History  Substance Use Topics  . Smoking status: Former Smoker -- 0.50 packs/day for  40 years    Types: Cigarettes    Quit date: 07/01/2004  . Smokeless tobacco: Never Used  . Alcohol Use: No   OB History   Grav Para Term Preterm Abortions TAB SAB Ect Mult Living                 Review of Systems  All other systems reviewed and are negative.    Allergies  Effexor  Home Medications   Current Outpatient Rx  Name  Route  Sig  Dispense  Refill  . aspirin 325 MG tablet   Oral   Take 325 mg by mouth daily.         . Cholecalciferol (VITAMIN D-3) 5000 UNITS TABS   Oral   Take 1 capsule by mouth daily.         . citalopram (CELEXA) 10 MG tablet   Oral   Take 10 mg by mouth daily.         . diphenhydrAMINE (BENADRYL) 25 mg capsule   Oral   Take 25 mg by mouth every 6 (six) hours as needed for allergies.         Marland Kitchen gabapentin (NEURONTIN) 300  MG capsule   Oral   Take 300 mg by mouth at bedtime as needed (nerve pain).          . nebivolol (BYSTOLIC) 10 MG tablet   Oral   Take 5 mg by mouth daily.           BP 142/99  Pulse 88  Temp(Src) 97.6 F (36.4 C) (Oral)  Resp 20  SpO2 97% Physical Exam  Nursing note and vitals reviewed. Constitutional: No distress.  Morbidly obese  HENT:  Head: Normocephalic and atraumatic.  Right Ear: External ear normal.  Left Ear: External ear normal.  Mouth/Throat: No oropharyngeal exudate.  Eyes: Conjunctivae are normal. Right eye exhibits no discharge. Left eye exhibits no discharge. No scleral icterus.  Neck: Neck supple. No tracheal deviation present.  Cardiovascular: Normal rate, regular rhythm and intact distal pulses.   Pulmonary/Chest: Effort normal and breath sounds normal. No stridor. No respiratory distress. She has no wheezes. She has no rales.  Abdominal: Soft. Bowel sounds are normal. She exhibits no distension. There is no tenderness. There is no rebound and no guarding.  Genitourinary:  Malodorous, the patient has stool and mucus between her legs  Musculoskeletal: She exhibits edema and  tenderness.  Chronic edema bilateral lower extremities, the patient's legs are wrapped in pressure or bandages with Coban, she is erythema and induration extending proximally in her left leg towards her mid thigh;   Neurological: She is alert. She has normal strength. No sensory deficit. Cranial nerve deficit:  no gross defecits noted. She exhibits normal muscle tone. She displays no seizure activity. Coordination normal.  Skin: Skin is warm and dry. Rash noted.  The patient has moist areas of  erythema in her pannus folds,  Psychiatric: She has a normal mood and affect.    ED Course  Procedures (including critical care time)  EKG Rate 93 ATRIAL FIBRILLATION BORDERLINE LOW VOLTAGE IN FRONTAL LEADS BORDERLINE PROLONGED QT INTERVAL BASELINE WANDER IN LEAD(S) III,V3  Labs Review Labs Reviewed  CBC WITH DIFFERENTIAL - Abnormal; Notable for the following:    WBC 16.8 (*)    RBC 3.63 (*)    Hemoglobin 11.0 (*)    HCT 33.3 (*)    Neutrophils Relative % 86 (*)    Neutro Abs 14.4 (*)    Lymphocytes Relative 8 (*)    All other components within normal limits  COMPREHENSIVE METABOLIC PANEL - Abnormal; Notable for the following:    Sodium 132 (*)    CO2 16 (*)    Glucose, Bld 102 (*)    BUN 32 (*)    Creatinine, Ser 1.57 (*)    Albumin 1.8 (*)    Alkaline Phosphatase 121 (*)    GFR calc non Af Amer 34 (*)    GFR calc Af Amer 39 (*)    All other components within normal limits  CULTURE, BLOOD (ROUTINE X 2)  CULTURE, BLOOD (ROUTINE X 2)  URINE CULTURE  URINALYSIS, ROUTINE W REFLEX MICROSCOPIC  CG4 I-STAT (LACTIC ACID)   Imaging Review No results found.  1703. BP has improved after IV fluid hydration.  Abx ordered to cover for cellulitis as well as urinary tract infection.  The patient's lactic acid level was normal her blood pressure is now normal. I doubt sirs/sepsis.  MDM   1. UTI (urinary tract infection)   2. Cellulitis   3. Morbid obesity    The patient will  be  admitted to the hospital for IV antibiotics and further  treatment.    Celene Kras, MD 03/08/13 [phone]

## 2013-03-09 ENCOUNTER — Encounter (HOSPITAL_COMMUNITY): Payer: Self-pay

## 2013-03-09 DIAGNOSIS — R609 Edema, unspecified: Secondary | ICD-10-CM

## 2013-03-09 DIAGNOSIS — I369 Nonrheumatic tricuspid valve disorder, unspecified: Secondary | ICD-10-CM

## 2013-03-09 DIAGNOSIS — M79609 Pain in unspecified limb: Secondary | ICD-10-CM

## 2013-03-09 DIAGNOSIS — L02419 Cutaneous abscess of limb, unspecified: Principal | ICD-10-CM

## 2013-03-09 DIAGNOSIS — L03119 Cellulitis of unspecified part of limb: Secondary | ICD-10-CM

## 2013-03-09 DIAGNOSIS — N179 Acute kidney failure, unspecified: Secondary | ICD-10-CM

## 2013-03-09 LAB — CBC
HCT: 32.1 % — ABNORMAL LOW (ref 36.0–46.0)
Hemoglobin: 10.6 g/dL — ABNORMAL LOW (ref 12.0–15.0)
MCV: 92.8 fL (ref 78.0–100.0)
RDW: 14.8 % (ref 11.5–15.5)
WBC: 14.5 10*3/uL — ABNORMAL HIGH (ref 4.0–10.5)

## 2013-03-09 LAB — BASIC METABOLIC PANEL
CO2: 18 mEq/L — ABNORMAL LOW (ref 19–32)
Chloride: 106 mEq/L (ref 96–112)
Creatinine, Ser: 1.15 mg/dL — ABNORMAL HIGH (ref 0.50–1.10)
GFR calc Af Amer: 57 mL/min — ABNORMAL LOW (ref >90)
Potassium: 3.8 mEq/L (ref 3.5–5.1)

## 2013-03-09 LAB — TROPONIN I
Troponin I: <0.3 ng/mL (ref <0.30)
Troponin I: <0.3 ng/mL (ref <0.30)

## 2013-03-09 LAB — TSH: TSH: 0.837 u[IU]/mL (ref 0.350–4.500)

## 2013-03-09 MED ORDER — ENOXAPARIN SODIUM 80 MG/0.8ML ~~LOC~~ SOLN
80.0000 mg | SUBCUTANEOUS | Status: DC
  Administered 2013-03-09 – 2013-03-13 (×4): 80 mg via SUBCUTANEOUS
  Filled 2013-03-09 (×6): qty 0.8

## 2013-03-09 MED ORDER — PERFLUTREN LIPID MICROSPHERE: 1.0000 mL | INTRAVENOUS | Status: DC | PRN

## 2013-03-09 MED ORDER — PERFLUTREN LIPID MICROSPHERE
1.0000 mL | INTRAVENOUS | Status: AC | PRN
  Administered 2013-03-09: 13:00:00 2 mL via INTRAVENOUS
  Filled 2013-03-09: qty 10

## 2013-03-09 MED ORDER — VANCOMYCIN HCL 10 G IV SOLR
1250.0000 mg | Freq: Two times a day (BID) | INTRAVENOUS | Status: DC
  Administered 2013-03-09 – 2013-03-11 (×4): 1250 mg via INTRAVENOUS
  Filled 2013-03-09 (×6): qty 1250

## 2013-03-09 NOTE — Consult Note (Addendum)
WOC consult Note Reason for Consult:Bilateral LE with edema, perhaps lymphedema. Venous insufficiency classic signs are present, ie., hemosiderin staining, edema, but also LEs present with full thickness ulcerations on posterior and lateral surfaces. R>L. 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  Yeast overgrowth at pannus, beneath left breast. Sacrum: intertriginous dermatitis.  Left posterior thigh, moisture associated skin damage with friction. Wound type: Venous insufficiency vs lymphedema Pressure Ulcer POA: No to all except Sacrum, which is MASD, intertriginous dermatitis Measurement:circumferential on right LE., deepest crater lesion measures 4cm x 3cm x 0.4cm. Draining large amounts of serous fluid. Left LE with similar crater lesions, but they are smaller in diameter and still draining serous fluid, but copious amounts. Right upper chest:  3cm x 2.5cm x 4cm with calciphylaxis (calcium deposits) working up to top of skin in surrounding area. Left posterior thigh2.5cm x 6cm x 0.2cm: pink, moist with periwound maceration. Wound ZOX:WRUEA, pink, non-healing at right upper chest; craters on bilateral LEs are topped with yellow slough. Drainage (amount, consistency, odor) As described above. Serous at LEs, scant serous at right upper chest. Periwound:Intact, inflamed. periwound maceration at left posterior thigh wound. Dressing procedure/placement/frequency: Saline dressing twice daily to right upper chest.  Silicone dressings to bilateral LEs and then Unna's Boots changed twice weekly by orthopedic technician.  Bariatric bed with low air loss feature provided. Sacrum: 4cm x .5cm x 0.2cm open area will require application of skin barrier cream (zinc-based). WOC nursing team will not follow, but will remain available to this patient, the nursing and medical teams.  Please re-consult if needed. Thanks, Ladona Mow, MSN, RN, GNP, [place], CWON-AP  [phone])

## 2013-03-09 NOTE — Progress Notes (Signed)
Echocardiogram 2D Echocardiogram with Definity has been performed.  [name] 03/09/2013, 2:25 PM

## 2013-03-09 NOTE — Progress Notes (Signed)
[facility] PROGRESS NOTE  [name] UJW:[id number] DOB: [birth year] DOA: 03/08/2013 PCP: Marletta Lor, NP  Assessment/Plan: Cellulitis--left lower extremity - Although somewhat difficult to totally differentiate from the patient's baseline venous stasis dermatitis, LLE>RLE erythema and edema - Venous Doppler lower extremities rule out DVT - Continue vancomycin -WBC decreasing -wound care consult AKI - Improving with IV fluids - Serum creatinine 0.87 on 06/08/2012 - Baseline creatinine 0.8-1.0 Gait instability/lower extremity weakness/falls -Multifactorial including deconditioning, DJD knees, cellulitis, UTI -PT eval -IVF Pyuria  - Continue empiric ceftriaxone pending culture data  hypertension  - Continue amlodipine  - Blood pressure stable  PAF -CHADS-VASc = 3 -pt had rectal bleed earlier 2014 and stopped Xarelto - Discussed the risks, benefits, and alternatives with the patient--she does not want to restart anticoagulation  - Continue aspirin  -echo OSA -CPAP at night BMI 70 and over, adult -nutrition evaluation Family Communication:   Pt at beside Disposition Plan:   Home when medically stable   Antibiotics:  Ceftriaxone 03/08/2013>>>  Vancomycin 03/08/2013>>>    Procedures/Studies: Dg Chest Port 1 View  03/08/2013   CLINICAL DATA:  Cough and shortness of breast; recent trauma  EXAM: PORTABLE CHEST - 1 VIEW  COMPARISON:  September 07, 2011 and June 07, 2012  FINDINGS: There is cardiomegaly with mild pulmonary venous hypertension. There is no frank edema or consolidation. No appreciable effusions. No adenopathy. There are surgical clips in the right axilla.  IMPRESSION: No edema or consolidation. There is, however, cardiomegaly with a degree of pulmonary venous hypertension. Question early volume overload.   Electronically Signed   By: Bretta Bang   On: 03/08/2013 18:30         Subjective: Patient continues to complain of bilateral lower  extremity pain below the knees. Denies any fevers, chills, chest pain, shortness breath, vomiting, diarrhea, nausea, abdominal pain, dysuria.  Objective: Filed Vitals:   03/08/13 1433 03/08/13 1703 03/08/13 2240 03/09/13 0424  BP: 95/56 142/99 117/54 98/53  Pulse: 90 88 89 92  Temp: 97.6 F (36.4 C)  98.5 F (36.9 C) 98.1 F (36.7 C)  TempSrc: Oral  Oral Oral  Resp: 20 20 19 20   Height:   5\' 3"  (1.6 m)   Weight:   180.9 kg (398 lb 13 oz) 180.9 kg (398 lb 13 oz)  SpO2: 99% 97% 100% 100%    Intake/Output Summary (Last 24 hours) at 03/09/13 0853 Last data filed at 03/09/13 0819  Gross per 24 hour  Intake    360 ml  Output      0 ml  Net    360 ml   Weight change:  Exam:   General:  Pt is alert, follows commands appropriately, not in acute distress  HEENT: No icterus, No thrush, No meningismus, [place]/AT  Cardiovascular: IRRR, no rubs, no gallops  Respiratory: Diminished breath sounds at the bases but clear to auscultation.  Abdomen: Soft/+BS, non tender, non distended, no guarding  Extremities: 2+ LE edema, scattered bilateral venostasis ulcerations without any pus. Erythema left lower extremity greater than right lower extremity below the knees without any crepitance or necrosis.   Data Reviewed: Basic Metabolic Panel:  Recent Labs Lab 03/08/13 1555 03/08/13 2300 03/09/13 0422  NA 132*  --  136  K 3.7  --  3.8  CL 101  --  106  CO2 16*  --  18*  GLUCOSE 102*  --  83  BUN 32*  --  24*  CREATININE 1.57*  --  1.15*  CALCIUM 8.7  --  8.4  MG  --  2.1  --   PHOS  --  3.3  --    Liver Function Tests:  Recent Labs Lab 03/08/13 1555  AST 15  ALT 16  ALKPHOS 121*  BILITOT 0.3  PROT 7.2  ALBUMIN 1.8*   No results found for this basename: LIPASE, AMYLASE,  in the last 168 hours No results found for this basename: AMMONIA,  in the last 168 hours CBC:  Recent Labs Lab 03/08/13 1555 03/09/13 0422  WBC 16.8* 14.5*  NEUTROABS 14.4*  --   HGB 11.0* 10.6*   HCT 33.3* 32.1*  MCV 91.7 92.8  PLT 344 343   Cardiac Enzymes:  Recent Labs Lab 03/08/13 2300 03/09/13 0422  TROPONINI <0.30 <0.30   BNP: No components found with this basename: POCBNP,  CBG: No results found for this basename: GLUCAP,  in the last 168 hours  No results found for this or any previous visit (from the past 240 hour(s)).   Scheduled Meds: . aspirin  325 mg Oral Daily  . cefTRIAXone (ROCEPHIN)  IV  1 g Intravenous Q24H  . citalopram  10 mg Oral Daily  . enoxaparin (LOVENOX) injection  80 mg Subcutaneous Q24H  . nebivolol  5 mg Oral Daily  . sodium chloride  3 mL Intravenous Q12H  . vancomycin  1,250 mg Intravenous Q12H   Continuous Infusions: . sodium chloride 1,000 mL (03/08/13 2024)  . sodium chloride 50 mL/hr at 03/09/13 0629     Sabel Hornbeck, DO  [facility] Pager [phone]  If 7PM-7AM, please contact night-coverage [email or web address] Password [email or web address] 03/09/2013, 8:53 AM   LOS: 1 day

## 2013-03-09 NOTE — Progress Notes (Signed)
Lovenox per Pharmacy for DVT Prophylaxis    Pharmacy has been consulted from dosing enoxaparin (lovenox) in this patient for DVT prophylaxis.  The pharmacist has reviewed pertinent labs (Hgb _10.6__; PLT_343), patient weight (_180__kg) and renal function (CrCl_81__mL/min) and decided that enoxaparin _80_mg SQ Q_24_Hrs is appropriate for this patient.  The pharmacy department will sign off at this time.  Please reconsult pharmacy if status changes or for further issues.  Thank you  Luetta Nutting PharmD, BCPS  03/09/2013, 5:42 AM

## 2013-03-09 NOTE — Progress Notes (Signed)
PT Cancellation Note  Patient Details Name: [name] MRN: [id number] DOB: 06-Jan-1951   Cancelled Treatment:    Reason Eval/Treat Not Completed: Fatigue/lethargy limiting ability to participate, pt declined.   Rada Hay 03/09/2013, 2:31 PM Blanchard Kelch PT [phone]

## 2013-03-09 NOTE — Progress Notes (Signed)
*  PRELIMINARY RESULTS* Vascular Ultrasound Lower extremity venous duplex has been completed.  Preliminary findings: technically limited due to body habitus and bandages on lower extremities. No obvious DVT noted in visualized veins bilaterally.    Farrel Demark, RDMS, RVT  03/09/2013, 11:56 AM

## 2013-03-09 NOTE — Care Management Note (Addendum)
    Page 1 of 1   03/13/2013     8:45:33 AM   CARE MANAGEMENT NOTE 03/13/2013  Patient:  [name], [name]   Account Number:  [id number]  Date Initiated:  03/09/2013  Documentation initiated by:  Lanier Clam  Subjective/Objective Assessment:   62 Y/O F ADMITTED W/FALL,L LEG CELLULITIS.     Action/Plan:   FROM HOME.ACTIVE W/[facility]-HHRN-DSG CHANGES.   Anticipated DC Date:  03/13/2013   Anticipated DC Plan:  SKILLED NURSING FACILITY      DC Planning Services  CM consult      [facility] Choice  Resumption Of Svcs/PTA Provider   Choice offered to / List presented to:             Status of service:  Completed, signed off Medicare Important Message given?   (If response is "NO", the following Medicare IM given date fields will be blank) Date Medicare IM given:   Date Additional Medicare IM given:    Discharge Disposition:  SKILLED NURSING FACILITY  Per UR Regulation:  Reviewed for med. necessity/level of care/duration of stay  If discussed at Long Length of Stay Meetings, dates discussed:    Comments:  03/13/13 Brant Peets RN,BSN NCM [phone] D/C SNF. 03/10/13 Lillyth Spong RN,BSN NCM [phone] PT-SNF.PATIENT AGREEABLE.CSW/MD UPDATED.   03/08/13 Dena Esperanza RN,BSN NCM [phone] CONFIRMED W/[facility] REP KRISTEN ACTIVE W/HHRN-DSG CHANGES.IF APPROPRIATE, WILL NEED HHRN ORDER @ D/C.

## 2013-03-09 NOTE — Progress Notes (Signed)
Vancomycin Consult  62 YOF presented 9/17 with frequent falls (6x in the last week), per physician's physical exam, patient is noted to be morbidly obese with stool/mucus between legs as well as L leg erythema. Both lower extremities noted to have edema. Abx started for cellulitis and UTI (UA c/w infection).  9/17 >> Vancomycin >> 9/17 >> Rocephin (MD) >>  Tmax: afebrile WBCs: improved to 14.5 Renal: SCr improved to 1.15, CG 81, N 56  9/17 blood >> in process 9/17 urine >> in process  Today is D#2 of Vancomycin and Rocephin. Looks like VT was 19 in 05/2012 on 1500mg  IV q12h and Scr was ~0.8 at that time. AKI here on admit but Scr improving.   Plan: Adjust Vancomycin to 1250 mg IV q12h given known historical info and expected further improvement in Scr.    Geoffry Paradise, PharmD, BCPS Pager: [phone] 8:47 AM Pharmacy #: [phone]

## 2013-03-10 DIAGNOSIS — I4891 Unspecified atrial fibrillation: Secondary | ICD-10-CM

## 2013-03-10 DIAGNOSIS — A419 Sepsis, unspecified organism: Secondary | ICD-10-CM

## 2013-03-10 LAB — CBC
MCH: 30.4 pg (ref 26.0–34.0)
MCHC: 32.3 g/dL (ref 30.0–36.0)
MCV: 94.2 fL (ref 78.0–100.0)
Platelets: 352 10*3/uL (ref 150–400)
RDW: 15.1 % (ref 11.5–15.5)

## 2013-03-10 LAB — BASIC METABOLIC PANEL
BUN: 19 mg/dL (ref 6–23)
Calcium: 8.4 mg/dL (ref 8.4–10.5)
Creatinine, Ser: 1.12 mg/dL — ABNORMAL HIGH (ref 0.50–1.10)
GFR calc Af Amer: 59 mL/min — ABNORMAL LOW (ref >90)

## 2013-03-10 NOTE — Clinical Social Work Psychosocial (Signed)
     Clinical Social Work Department BRIEF PSYCHOSOCIAL ASSESSMENT 03/10/2013  Patient:  [name], [name]     Account Number:  [id number]     Admit date:  03/08/2013  Clinical Social Worker:  Hattie Perch  Date/Time:  03/10/2013 12:00 M  Referred by:  Physician  Date Referred:  03/10/2013 Referred for  SNF Placement   Other Referral:   Interview type:  Patient Other interview type:    PSYCHOSOCIAL DATA Living Status:  ALONE Admitted from facility:   Level of care:   Primary support name:  justin Pollino Primary support relationship to patient:  CHILD, ADULT Degree of support available:   fair    CURRENT CONCERNS Current Concerns  Post-Acute Placement   Other Concerns:    SOCIAL WORK ASSESSMENT / PLAN CSW met with patient. patient is alert and oriented X3. patient in need of snf placement. patient is agreeable to snf. she is concerned that she is not able to clean herself really well after she goes to the bathroom. CSW explained rehab and faxing out procedure. patient is agreeable to same.   Assessment/plan status:   Other assessment/ plan:   Information/referral to community resources:    PATIENTS/FAMILYS RESPONSE TO PLAN OF CARE: patient is anxious about the idea of rehab but hopeful that it will be able to help her.

## 2013-03-10 NOTE — Evaluation (Signed)
Physical Therapy Evaluation Patient Details Name: [name] MRN: [id number] DOB: 04/07/1951 Today's Date: 03/10/2013 Time: 4098-1191 PT Time Calculation (min): 51 min  PT Assessment / Plan / Recommendation History of Present Illness  Pt is 62 yo female with HTN, HLD, morbid obesity who presents to [facility] ED with main concern of several episodes of fall at home over the past few weeks.  Pt also has chronic recurrent cellulitis and is found to have a UTI  Clinical Impression  Pt with morbid obesity,but she is fairly mobile in the bed and has potential to return to ambulation.  She will benefit from continued LE strengthening and mobility training at SNF    PT Assessment  Patient needs continued PT services    Follow Up Recommendations  SNF    Does the patient have the potential to tolerate intense rehabilitation      Barriers to Discharge Decreased caregiver support      Equipment Recommendations  None recommended by PT    Recommendations for Other Services OT consult   Frequency Min 3X/week    Precautions / Restrictions Precautions Precautions: Fall   Pertinent Vitals/Pain Pain in legs with exercise      Mobility  Bed Mobility Bed Mobility: Rolling Right;Rolling Left;Supine to Sit;Sit to Supine Rolling Right: 6: Modified independent (Device/Increase time);With rail Rolling Left: 6: Modified independent (Device/Increase time);With rail Supine to Sit: 4: Min assist;HOB elevated;With rails Sit to Supine: 4: Min assist;HOB flat;With rail Details for Bed Mobility Assistance: Pt using bedrails and instaflate or deflate feature of bari bed to assist with bed mobility Transfers Transfers: Not assessed Details for Transfer Assistance: pt too anxious to attempt standing today.  She is afraid her legs with give out Ambulation/Gait Ambulation/Gait Assistance: Not tested (comment)    Exercises General Exercises - Lower Extremity Ankle Circles/Pumps: Both;5 reps;Supine;AROM Quad  Sets: AROM;Both;5 reps;Supine Gluteal Sets: AROM;Both;5 reps;Supine Short Arc Quad: AROM;Both;Supine;10 reps Long Arc Quad: AROM;Both;5 reps;Seated Hip ABduction/ADduction: AROM;Both;10 reps;Sidelying Hip Flexion/Marching: AROM;Both;5 reps;Supine Other Exercises Other Exercises: abd sets   PT Diagnosis: Difficulty walking;Abnormality of gait;Generalized weakness;Acute pain  PT Problem List: Decreased strength;Decreased range of motion;Decreased activity tolerance;Pain;Decreased mobility;Decreased skin integrity PT Treatment Interventions: Gait training;Functional mobility training;Therapeutic activities;Therapeutic exercise;Balance training;Patient/family education;Neuromuscular re-education     PT Goals(Current goals can be found in the care plan section) Acute Rehab PT Goals Patient Stated Goal: to get stronger and return back home PT Goal Formulation: With patient Time For Goal Achievement: 03/24/13 Potential to Achieve Goals: Good  Visit Information  Last PT Received On: 03/10/13 Assistance Needed: +1 History of Present Illness: Pt is 62 yo female with HTN, HLD, morbid obesity who presents to [facility] ED with main concern of several episodes of fall at home over the past few weeks.  Pt also has chronic recurrent cellulitis and is found to have a UTI       Prior Functioning  Home Living Family/patient expects to be discharged to:: Private residence Living Arrangements: Alone Type of Home: House Home Access: Stairs to enter Secretary/administrator of Steps: 1 Home Layout: One level Home Equipment: Bedside commode;Walker - 2 wheels Prior Function Level of Independence: Independent Communication Communication: No difficulties    Cognition  Cognition Arousal/Alertness: Awake/alert Behavior During Therapy: WFL for tasks assessed/performed Overall Cognitive Status: Within Functional Limits for tasks assessed    Extremity/Trunk Assessment Lower Extremity Assessment Lower  Extremity Assessment: RLE deficits/detail;LLE deficits/detail RLE Deficits / Details: pt with unna boot on lower leg contolling edema and providing wound  management.  Heavy thigh with lobules and inability to completely extend knee.  Quad strength ~ 3+/5 LLE Deficits / Details: Pt with unna boot on lower leg. Thigh with fibrotic lobule with draining wound with alleyn dressing on medial aspect .  Pt unable to compeletey extend knee with quad strength ~ 3/5 Cervical / Trunk Assessment Cervical / Trunk Assessment: Other exceptions Cervical / Trunk Exceptions: morbid obesity, but pt able to activate all muscle groups   Balance Balance Balance Assessed: Yes Static Sitting Balance Static Sitting - Balance Support: Bilateral upper extremity supported;Feet unsupported Static Sitting - Comment/# of Minutes: pt able to sit on EOB ~ 10 minutes before fatiguing and requesting to return to supine  End of Session PT - End of Session Activity Tolerance: Patient tolerated treatment well;Patient limited by pain Patient left: in bed Nurse Communication: Mobility status  GP    Rosey Bath K. Manson Passey, [place] [phone] 03/10/2013, 1:26 PM

## 2013-03-10 NOTE — Clinical Documentation Improvement (Signed)
THIS DOCUMENT IS NOT A PERMANENT PART OF THE MEDICAL RECORD  Please update your documentation within the medical record to reflect your response to this query. If you need help knowing how to do this please call [phone].  03/10/13  Dear Dr. Arbutus Leas, Algis Downs Marton Redwood  In a better effort to capture your patient's severity of illness, reflect appropriate length of stay and utilization of resources, a review of the medical record has revealed the following indicators.    Based on your clinical judgment, please clarify and document in a progress note and/or discharge summary the clinical condition associated with the following supporting information:  In responding to this query please exercise your independent judgment.  The fact that a query is asked, does not imply that any particular answer is desired or expected.  Abnormal findings (laboratory, x-ray, pathologic, and other diagnostic results) are not coded and reported unless the physician indicates their clinical significance.   The medical record reflects the following clinical findings, please clarify the diagnostic and/or clinical significance:       Pt with  Pulmonary venous HTN per Chest X-ray  Clarification Needed   Please clarify if you agree "Pulmonary venous HTN," or other diagnosis in setting of cardiomegaly, fluid overload, and bilateral LE with edema (lympehedema).     Possible Clinical Conditions?                                   Cellulitis  ARF  Acidosis  SIR  AFIB  HTN  Other Condition___________________                  Cannot Clinically Determine_________      Supporting Information: Cellulitis ARF Acidosis SIRs UTI AFIB HTN  Treatment:  Aspirin 325 Bystolic tab 5 mg     Reviewed:  no additional documentation provided  ljh   Thank You,  Enis Slipper  RN, BSN, MSN/Inf, CCDS Clinical Documentation Specialist [facility] Dept Pager: [phone] / E-mail:  [email or web address]  [phone] Health Information Management [place]

## 2013-03-10 NOTE — Progress Notes (Signed)
[facility] PROGRESS NOTE  [name] ZOX:[id number] DOB: [birth year] DOA: 03/08/2013 PCP: Marletta Lor, NP  Assessment/Plan: Cellulitis--left lower extremity  - Although somewhat difficult to totally differentiate from the patient's baseline venous stasis dermatitis, LLE>RLE erythema and edema  - Venous Doppler lower extremities--neg for DVT - Continue vancomycin  -WBC decreasing  -appreciate wound care consult  AKI  - Improving with IV fluids  - Serum creatinine 0.87 on 06/08/2012  - Baseline creatinine 0.8-1.0  Gait instability/lower extremity weakness/falls  -Multifactorial including deconditioning, DJD knees, cellulitis, UTI  -PT eval-->SNF -IVF  Pyuria  - Continue empiric ceftriaxone pending culture data  hypertension  - Continue nebivolol - Blood pressure stable  PAF  -CHADS-VASc = 3  -pt had rectal bleed earlier 2014 and stopped Xarelto  - Discussed the risks, benefits, and alternatives with the patient--she does not want to restart anticoagulation  - Continue aspirin  -echo--EF 50-55%, no WMA, grade 1 diastolic dysfunction, mild PA HTN OSA  -CPAP at night  BMI 70 and over, adult  -nutrition evaluation  Family Communication: Pt at beside  Disposition Plan: Home when medically stable  Antibiotics:  Ceftriaxone 03/08/2013>>>  Vancomycin 03/08/2013>>>   Family Communication:   Pt at beside Disposition Plan:   SNF when medically stable      Procedures/Studies: Dg Chest Port 1 View  03/08/2013   CLINICAL DATA:  Cough and shortness of breast; recent trauma  EXAM: PORTABLE CHEST - 1 VIEW  COMPARISON:  September 07, 2011 and June 07, 2012  FINDINGS: There is cardiomegaly with mild pulmonary venous hypertension. There is no frank edema or consolidation. No appreciable effusions. No adenopathy. There are surgical clips in the right axilla.  IMPRESSION: No edema or consolidation. There is, however, cardiomegaly with a degree of pulmonary venous hypertension.  Question early volume overload.   Electronically Signed   By: Bretta Bang   On: 03/08/2013 18:30         Subjective: Patient is feeling better today. Denies any fevers, chills, chest discomfort, shortness breath, nausea, vomiting, diarrhea, abdominal pain, dysuria.  Objective: Filed Vitals:   03/09/13 1343 03/09/13 2134 03/10/13 0513 03/10/13 1415  BP: 115/60 102/50 112/63 94/51  Pulse: 90 83 62 86  Temp: 98.3 F (36.8 C) 98 F (36.7 C) 99.1 F (37.3 C) 97.7 F (36.5 C)  TempSrc: Oral Oral Axillary Oral  Resp: 22 18 18 18   Height:      Weight:      SpO2: 97% 99% 96% 98%    Intake/Output Summary (Last 24 hours) at 03/10/13 1842 Last data filed at 03/10/13 1300  Gross per 24 hour  Intake   1080 ml  Output    550 ml  Net    530 ml   Weight change:  Exam:   General:  Pt is alert, follows commands appropriately, not in acute distress  HEENT: No icterus, No thrush,  /AT  Cardiovascular: RRR, S1/S2, no rubs, no gallops  Respiratory: Diminished breath sounds at the bases but clear to auscultation. No wheezing. Good air movement  Abdomen: Soft/+BS, non tender, non distended, no guarding  Extremities: 2+ edema, No lymphangitis, No petechiae, No rashes, no synovitis  Data Reviewed: Basic Metabolic Panel:  Recent Labs Lab 03/08/13 1555 03/08/13 2300 03/09/13 0422 03/10/13 0457  NA 132*  --  136 137  K 3.7  --  3.8 3.5  CL 101  --  106 110  CO2 16*  --  18* 19  GLUCOSE 102*  --  83 85  BUN 32*  --  24* 19  CREATININE 1.57*  --  1.15* 1.12*  CALCIUM 8.7  --  8.4 8.4  MG  --  2.1  --   --   PHOS  --  3.3  --   --    Liver Function Tests:  Recent Labs Lab 03/08/13 1555  AST 15  ALT 16  ALKPHOS 121*  BILITOT 0.3  PROT 7.2  ALBUMIN 1.8*   No results found for this basename: LIPASE, AMYLASE,  in the last 168 hours No results found for this basename: AMMONIA,  in the last 168 hours CBC:  Recent Labs Lab 03/08/13 1555 03/09/13 0422  03/10/13 0457  WBC 16.8* 14.5* 12.5*  NEUTROABS 14.4*  --   --   HGB 11.0* 10.6* 10.0*  HCT 33.3* 32.1* 31.0*  MCV 91.7 92.8 94.2  PLT 344 343 352   Cardiac Enzymes:  Recent Labs Lab 03/08/13 2300 03/09/13 0422 03/09/13 0925  TROPONINI <0.30 <0.30 <0.30   BNP: No components found with this basename: POCBNP,  CBG: No results found for this basename: GLUCAP,  in the last 168 hours  Recent Results (from the past 240 hour(s))  CULTURE, BLOOD (ROUTINE X 2)     Status: None   Collection Time    03/08/13  3:55 PM      Result Value Range Status   Specimen Description BLOOD R UPPER   Final   Special Requests NONE BOTTLES DRAWN AEROBIC AND ANAEROBIC 2CC   Final   Culture  Setup Time     Final   Value: 03/09/2013 03:42     Performed at [facility]   Culture     Final   Value:        BLOOD CULTURE RECEIVED NO GROWTH TO DATE CULTURE WILL BE HELD FOR 5 DAYS BEFORE ISSUING A FINAL NEGATIVE REPORT     Performed at [facility]   Report Status PENDING   Incomplete  CULTURE, BLOOD (ROUTINE X 2)     Status: None   Collection Time    03/08/13  4:35 PM      Result Value Range Status   Specimen Description BLOOD LEFT ARM   Final   Special Requests BOTTLES DRAWN AEROBIC AND ANAEROBIC 5CC   Final   Culture  Setup Time     Final   Value: 03/09/2013 03:42     Performed at [facility]   Culture     Final   Value:        BLOOD CULTURE RECEIVED NO GROWTH TO DATE CULTURE WILL BE HELD FOR 5 DAYS BEFORE ISSUING A FINAL NEGATIVE REPORT     Performed at [facility]   Report Status PENDING   Incomplete  URINE CULTURE     Status: None   Collection Time    03/08/13  5:15 PM      Result Value Range Status   Specimen Description URINE, CATHETERIZED   Final   Special Requests NONE   Final   Culture  Setup Time     Final   Value: 03/09/2013 01:27     Performed at [facility] Count PENDING   Incomplete   Culture     Final   Value: Culture  reincubated for better growth     Performed at [facility]   Report Status PENDING   Incomplete     Scheduled Meds: . aspirin  325  mg Oral Daily  . cefTRIAXone (ROCEPHIN)  IV  1 g Intravenous Q24H  . citalopram  10 mg Oral Daily  . enoxaparin (LOVENOX) injection  80 mg Subcutaneous Q24H  . nebivolol  5 mg Oral Daily  . sodium chloride  3 mL Intravenous Q12H  . vancomycin  1,250 mg Intravenous Q12H   Continuous Infusions: . sodium chloride 50 mL/hr at 03/09/13 0629     Zakaree Mcclenahan, DO  [facility] Pager [phone]  If 7PM-7AM, please contact night-coverage [email or web address] Password [email or web address] 03/10/2013, 6:42 PM   LOS: 2 days

## 2013-03-11 DIAGNOSIS — D72829 Elevated white blood cell count, unspecified: Secondary | ICD-10-CM

## 2013-03-11 LAB — BASIC METABOLIC PANEL
Calcium: 8.7 mg/dL (ref 8.4–10.5)
Creatinine, Ser: 1.07 mg/dL (ref 0.50–1.10)
GFR calc non Af Amer: 54 mL/min — ABNORMAL LOW (ref >90)
Sodium: 139 mEq/L (ref 135–145)

## 2013-03-11 LAB — URINE CULTURE

## 2013-03-11 LAB — CBC
MCH: 29.5 pg (ref 26.0–34.0)
Platelets: 369 10*3/uL (ref 150–400)
RBC: 3.8 MIL/uL — ABNORMAL LOW (ref 3.87–5.11)
RDW: 15.3 % (ref 11.5–15.5)
WBC: 11.6 10*3/uL — ABNORMAL HIGH (ref 4.0–10.5)

## 2013-03-11 LAB — VANCOMYCIN, TROUGH: Vancomycin Tr: 34 ug/mL (ref 10.0–20.0)

## 2013-03-11 NOTE — Progress Notes (Signed)
ANTIBIOTIC CONSULT NOTE - FOLLOW UP  Pharmacy Consult for Vancomycin Indication: Cellulitis, UTI  Allergies  Allergen Reactions  . Effexor [Venlafaxine Hydrochloride] Anxiety    Made her want to kill herself    Patient Measurements: Height: 5\' 3"  (160 cm) Weight: 398 lb 13 oz (180.9 kg) IBW/kg (Calculated) : 52.4  Vital Signs: Temp: 98 F (36.7 C) (09/20 0530) Temp src: Oral (09/20 0530) BP: 128/63 mmHg (09/20 0530) Pulse Rate: 73 (09/20 0530) Intake/Output from previous day: 09/19 0701 - 09/20 0700 In: 2318.3 [P.O.:720; I.V.:598.3; IV Piggyback:1000] Out: 350 [Urine:350]  Labs:  Recent Labs  03/09/13 0422 03/10/13 0457 03/11/13 0513  WBC 14.5* 12.5* 11.6*  HGB 10.6* 10.0* 11.2*  PLT 343 352 369  CREATININE 1.15* 1.12* 1.07   Estimated Creatinine Clearance: 87 ml/min (by C-G formula based on Cr of 1.07). No results found for this basename: VANCOTROUGH, Leodis Binet, VANCORANDOM, GENTTROUGH, GENTPEAK, GENTRANDOM, TOBRATROUGH, TOBRAPEAK, TOBRARND, AMIKACINPEAK, AMIKACINTROU, AMIKACIN,  in the last 72 hours   Microbiology: 9/17 blood >> NGTD 9/17 urine >> re-incubated  Anti-infectives: 9/17 >> Vancomycin >> 9/17 >> Rocephin  >>   Assessment: 27 YOF presented 9/17 with frequent falls (6x in the last week), per physician's physical exam, patient is noted to be morbidly obese with stool/mucus between legs as well as L leg erythema. Both lower extremities noted to have edema. Pharmacy asked to dose vancomycin for cellulitis and UTI (UA c/w infection).  Day #4 Vancomycin (Rx dosing) and Ceftriaxone (MD dosing). SCr continues to improve, CrCl ~ 60 ml/min Normalized  WBC improving  Cultures remain NGTD  Goal of Therapy:  Vancomycin trough level 10-15 mcg/ml  Plan:   Continue Vancomycin 1250mg  IV q12 h.  Measure Vanc trough at steady state, before 1800 dose today.  Follow up renal fxn and culture results.    Lynann Beaver PharmD, BCPS Pager  [phone] 03/11/2013 1:03 PM

## 2013-03-11 NOTE — Progress Notes (Signed)
CRITICAL VALUE ALERT  Critical value received: van trough 34.0  Date of notification:  03/11/13  Time of notification:  1800  Critical value read back:yes  Nurse who received alert:  Nonah Mattes  MD notified (1st page):  Notified pharmacy  Time of first page:    MD notified (2nd page):  Time of second page:  Responding MD:    Time MD responded:

## 2013-03-11 NOTE — Progress Notes (Signed)
[facility] PROGRESS NOTE  [name] EXB:[id number] DOB: [birth year] DOA: 03/08/2013 PCP: Marletta Lor, NP  Assessment/Plan: Cellulitis--left lower extremity  - Although somewhat difficult to totally differentiate from the patient's baseline venous stasis dermatitis, LLE>RLE erythema and edema  - Venous Doppler lower extremities--neg for DVT  - Continue vancomycin  -WBC decreasing  -appreciate wound care consult  AKI  - Improving with IV fluids  - Serum creatinine 0.87 on 06/08/2012  - Baseline creatinine 0.8-1.0  Gait instability/lower extremity weakness/falls  -Multifactorial including deconditioning, DJD knees, cellulitis, UTI  -PT eval-->SNF  -IVF--saline lock Pyuria  - Continue empiric ceftriaxone pending culture data  hypertension  - Continue nebivolol  - Blood pressure stable  PAF  -CHADS-VASc = 3  -pt had rectal bleed earlier 2014 and stopped Xarelto  - Discussed the risks, benefits, and alternatives with the patient--she does not want to restart anticoagulation  - Continue aspirin  -echo--EF 50-55%, no WMA, grade 1 diastolic dysfunction, mild PA HTN  OSA  -CPAP at night  BMI 70 and over, adult  -nutrition evaluation  Family Communication: Pt at beside  Disposition Plan: Home when medically stable  Antibiotics:  Ceftriaxone 03/08/2013>>>  Vancomycin 03/08/2013>>>  Family Communication: Pt at beside  Disposition Plan: SNF when medically stable          Procedures/Studies: Dg Chest Port 1 View  03/08/2013   CLINICAL DATA:  Cough and shortness of breast; recent trauma  EXAM: PORTABLE CHEST - 1 VIEW  COMPARISON:  September 07, 2011 and June 07, 2012  FINDINGS: There is cardiomegaly with mild pulmonary venous hypertension. There is no frank edema or consolidation. No appreciable effusions. No adenopathy. There are surgical clips in the right axilla.  IMPRESSION: No edema or consolidation. There is, however, cardiomegaly with a degree of pulmonary venous  hypertension. Question early volume overload.   Electronically Signed   By: Bretta Bang   On: 03/08/2013 18:30         Subjective: Feeling well. She denies any fevers, chills, chest discomfort, shortness breath, nausea, vomiting, diarrhea, abdominal pain, dysuria, hematuria. No rashes. She worked well with physical therapy today.  Objective: Filed Vitals:   03/10/13 1415 03/10/13 2211 03/11/13 0530 03/11/13 1300  BP: 94/51 132/69 128/63 111/51  Pulse: 86 78 73 76  Temp: 97.7 F (36.5 C) 97.7 F (36.5 C) 98 F (36.7 C) 98.1 F (36.7 C)  TempSrc: Oral Oral Oral Oral  Resp: 18 18 18 16   Height:      Weight:      SpO2: 98% 99% 100% 97%    Intake/Output Summary (Last 24 hours) at 03/11/13 1527 Last data filed at 03/11/13 1500  Gross per 24 hour  Intake 1838.33 ml  Output    300 ml  Net 1538.33 ml   Weight change:  Exam:   General:  Pt is alert, follows commands appropriately, not in acute distress  HEENT: No icterus, No thrush,[place]/AT  Cardiovascular: RRR, S1/S2, no rubs, no gallops  Respiratory: CTA bilaterally, no wheezing,  Abdomen: Soft/+BS, non tender, non distended, no guarding  Extremities: 2+ edema, No lymphangitis, limited visualization secondary to UNA boots  Data Reviewed: Basic Metabolic Panel:  Recent Labs Lab 03/08/13 1555 03/08/13 2300 03/09/13 0422 03/10/13 0457 03/11/13 0513  NA 132*  --  136 137 139  K 3.7  --  3.8 3.5 3.7  CL 101  --  106 110 111  CO2 16*  --  18* 19 19  GLUCOSE 102*  --  83 85 90  BUN 32*  --  24* 19 15  CREATININE 1.57*  --  1.15* 1.12* 1.07  CALCIUM 8.7  --  8.4 8.4 8.7  MG  --  2.1  --   --   --   PHOS  --  3.3  --   --   --    Liver Function Tests:  Recent Labs Lab 03/08/13 1555  AST 15  ALT 16  ALKPHOS 121*  BILITOT 0.3  PROT 7.2  ALBUMIN 1.8*   No results found for this basename: LIPASE, AMYLASE,  in the last 168 hours No results found for this basename: AMMONIA,  in the last 168  hours CBC:  Recent Labs Lab 03/08/13 1555 03/09/13 0422 03/10/13 0457 03/11/13 0513  WBC 16.8* 14.5* 12.5* 11.6*  NEUTROABS 14.4*  --   --   --   HGB 11.0* 10.6* 10.0* 11.2*  HCT 33.3* 32.1* 31.0* 36.4  MCV 91.7 92.8 94.2 95.8  PLT 344 343 352 369   Cardiac Enzymes:  Recent Labs Lab 03/08/13 2300 03/09/13 0422 03/09/13 0925  TROPONINI <0.30 <0.30 <0.30   BNP: No components found with this basename: POCBNP,  CBG: No results found for this basename: GLUCAP,  in the last 168 hours  Recent Results (from the past 240 hour(s))  CULTURE, BLOOD (ROUTINE X 2)     Status: None   Collection Time    03/08/13  3:55 PM      Result Value Range Status   Specimen Description BLOOD R UPPER   Final   Special Requests NONE BOTTLES DRAWN AEROBIC AND ANAEROBIC 2CC   Final   Culture  Setup Time     Final   Value: 03/09/2013 03:42     Performed at [facility]   Culture     Final   Value:        BLOOD CULTURE RECEIVED NO GROWTH TO DATE CULTURE WILL BE HELD FOR 5 DAYS BEFORE ISSUING A FINAL NEGATIVE REPORT     Performed at [facility]   Report Status PENDING   Incomplete  CULTURE, BLOOD (ROUTINE X 2)     Status: None   Collection Time    03/08/13  4:35 PM      Result Value Range Status   Specimen Description BLOOD LEFT ARM   Final   Special Requests BOTTLES DRAWN AEROBIC AND ANAEROBIC 5CC   Final   Culture  Setup Time     Final   Value: 03/09/2013 03:42     Performed at [facility]   Culture     Final   Value:        BLOOD CULTURE RECEIVED NO GROWTH TO DATE CULTURE WILL BE HELD FOR 5 DAYS BEFORE ISSUING A FINAL NEGATIVE REPORT     Performed at [facility]   Report Status PENDING   Incomplete  URINE CULTURE     Status: None   Collection Time    03/08/13  5:15 PM      Result Value Range Status   Specimen Description URINE, CATHETERIZED   Final   Special Requests NONE   Final   Culture  Setup Time     Final   Value: 03/09/2013 01:27      Performed at [facility] Count PENDING   Incomplete   Culture     Final   Value: Culture reincubated for better growth     Performed at [facility]   Report Status PENDING   Incomplete     Scheduled Meds: . aspirin  325 mg Oral Daily  . cefTRIAXone (ROCEPHIN)  IV  1 g Intravenous Q24H  . citalopram  10 mg Oral Daily  . enoxaparin (LOVENOX) injection  80 mg Subcutaneous Q24H  . nebivolol  5 mg Oral Daily  . sodium chloride  3 mL Intravenous Q12H  . vancomycin  1,250 mg Intravenous Q12H   Continuous Infusions: . sodium chloride 50 mL/hr at 03/11/13 0540     Davyon Fisch, DO  [facility] Pager [phone]  If 7PM-7AM, please contact night-coverage [email or web address] Password [email or web address] 03/11/2013, 3:27 PM   LOS: 3 days

## 2013-03-11 NOTE — Progress Notes (Signed)
CSW met with Pt and provided list of SNF beds available at this time. Weekday CSW to follow up.  [address] [place], [phone]

## 2013-03-11 NOTE — Progress Notes (Signed)
Physical Therapy Treatment Patient Details Name: Ermagene Saidi MRN: [id number] DOB: 08/26/1950 Today's Date: 03/11/2013 Time: 4098-1191 PT Time Calculation (min): 28 min  PT Assessment / Plan / Recommendation  History of Present Illness pt with some pain this am, but better able to participate with PT after pain med   PT Comments   Pt was pleased that she was able to stand today and is moving better in the bed.  Anticipate she will continue her gradual progress at SNF  Follow Up Recommendations  SNF     Does the patient have the potential to tolerate intense rehabilitation     Barriers to Discharge        Equipment Recommendations  None recommended by PT    Recommendations for Other Services OT consult  Frequency Min 3X/week   Progress towards PT Goals Progress towards PT goals: Progressing toward goals  Plan      Precautions / Restrictions Precautions Precautions: Fall   Pertinent Vitals/Pain C/o pain and itching on lower legs under unna boot    Mobility  Bed Mobility Bed Mobility: Rolling Right;Rolling Left;Supine to Sit;Sit to Supine Rolling Right: 6: Modified independent (Device/Increase time);With rail Rolling Left: 6: Modified independent (Device/Increase time);With rail Supine to Sit: 4: Min assist;HOB elevated;With rails Sit to Supine: 4: Min assist;HOB flat;With rail Details for Bed Mobility Assistance: Pt using bedrails and instaflate or deflate feature of bari bed to assist with bed mobility Transfers Transfers: Sit to Stand;Stand to Sit Sit to Stand: 1: +2 Total assist Sit to Stand: Patient Percentage: 80% Stand to Sit: 1: +2 Total assist Stand to Sit: Patient Percentage: 80% Details for Transfer Assistance: portable 6 ince platform placed under pt feet and walker as bed is too high for pt feet to reach the floor.  She was able to stand for 90 sec x 2 and activate quads and glutes to maintain standing posture. Ambulation/Gait Ambulation/Gait Assistance:  Not tested (comment)    Exercises General Exercises - Lower Extremity Ankle Circles/Pumps: Both;5 reps;Supine;AROM Quad Sets: AROM;Both;5 reps;Supine Gluteal Sets: AROM;Both;5 reps;Supine Short Arc Quad: AROM;Both;Supine;10 reps Long Arc Quad: AROM;Both;5 reps;Seated Hip Flexion/Marching: AROM;Both;5 reps;Supine   PT Diagnosis:    PT Problem List:   PT Treatment Interventions:     PT Goals (current goals can now be found in the care plan section)    Visit Information  Last PT Received On: 03/11/13 Assistance Needed: +2 History of Present Illness: pt with some pain this am, but better able to participate with PT after pain med    Subjective Data      Cognition  Cognition Arousal/Alertness: Awake/alert Behavior During Therapy: WFL for tasks assessed/performed Overall Cognitive Status: Within Functional Limits for tasks assessed    Balance  Static Sitting Balance Static Sitting - Balance Support: Bilateral upper extremity supported;Feet unsupported Static Standing Balance Static Standing - Balance Support: Bilateral upper extremity supported Static Standing - Level of Assistance: 5: Stand by assistance Static Standing - Comment/# of Minutes: 90 sec x 2  End of Session PT - End of Session Activity Tolerance: Patient tolerated treatment well;Patient limited by pain Patient left: in bed Nurse Communication: Mobility status   GP   Rosey Bath K. Manson Passey, [place] [phone]  03/11/2013, 4:12 PM

## 2013-03-11 NOTE — Progress Notes (Signed)
Brief Pharmacy Note - Vancomycin  Labs: vanc trough 34  A/P: Vanc trough supratherapeutic (goal 15-20). Will d/c current vanc order of 1250mg  IV q12 and recheck level in AM to see how quickly patient is clearing vanc   Hessie Knows, PharmD, BCPS Pager [phone] 03/11/2013 6:06 PM

## 2013-03-12 LAB — BASIC METABOLIC PANEL
BUN: 12 mg/dL (ref 6–23)
CO2: 20 mEq/L (ref 19–32)
Calcium: 8.6 mg/dL (ref 8.4–10.5)
GFR calc Af Amer: 66 mL/min — ABNORMAL LOW (ref >90)
GFR calc non Af Amer: 57 mL/min — ABNORMAL LOW (ref >90)
Sodium: 139 mEq/L (ref 135–145)

## 2013-03-12 LAB — CBC
HCT: 37.3 % (ref 36.0–46.0)
MCH: 29.8 pg (ref 26.0–34.0)
MCHC: 30.6 g/dL (ref 30.0–36.0)
Platelets: 359 10*3/uL (ref 150–400)
RBC: 3.83 MIL/uL — ABNORMAL LOW (ref 3.87–5.11)
RDW: 15.3 % (ref 11.5–15.5)
WBC: 9.4 10*3/uL (ref 4.0–10.5)

## 2013-03-12 LAB — VANCOMYCIN, RANDOM
Vancomycin Rm: 25.8 ug/mL
Vancomycin Rm: 22.5 ug/mL

## 2013-03-12 MED ORDER — CIPROFLOXACIN HCL 250 MG PO TABS
250.0000 mg | ORAL_TABLET | Freq: Two times a day (BID) | ORAL | Status: DC
  Administered 2013-03-12: 250 mg via ORAL
  Filled 2013-03-12 (×5): qty 1

## 2013-03-12 NOTE — Discharge Summary (Signed)
Physician Discharge Summary  [name] AVW:[id number] DOB: 10-Mar-1951 DOA: 03/08/2013  PCP: Marletta Lor, NP  Admit date: 03/08/2013 Discharge date: 03/13/2013  Recommendations for Outpatient Follow-up:  1. Pt will need to follow up with PCP in 2 weeks post discharge 2. Please obtain BMP to evaluate electrolytes and kidney function 3. Please also check CBC to evaluate Hg and Hct levels 4. Please change the Unna boot dressings twice per week Wound Care Recommendation Silicone dressings to bilateral LEs and then Unna's Boots changed twice weekly by orthopedic technician. Bariatric bed with low air loss feature provided. Sacrum: 4cm x .5cm x 0.2cm open area will require application of skin barrier cream (zinc-based). Saline dressing twice daily to right upper chest.  Discharge Diagnoses:  Principal Problem:   Fall Active Problems:   BMI 70 and over, adult   HTN (hypertension)   Lower extremity cellulitis   Obstructive sleep apnea on CPAP   PAF (paroxysmal atrial fibrillation)   Acute renal failure   Leukocytosis   UTI (lower urinary tract infection)   Metabolic acidosis, increased anion gap   Cellulitis, leg Cellulitis--left lower extremity  -leg erythema improving  - Although somewhat difficult to totally differentiate from the patient's baseline venous stasis dermatitis, LLE>RLE erythema and edema  - Venous Doppler lower extremities--neg for DVT  - Continue vancomycin while the patient is in house  -WBC improved  -appreciate wound care consult  - Plan to discharge the patient with Bactrim DS, 2 tablets twice a day which will also cover her urine isolate as well as her cellulitis  - Plan 5 more days of Bactrim DS which would complete 10 days of therapy. AKI  - Improved with IV fluids--back to baseline. - Serum creatinine 0.87 on 06/08/2012  - Baseline creatinine 0.8-1.0  Gait instability/lower extremity weakness/falls  -Multifactorial including deconditioning, DJD knees,  cellulitis, UTI  -PT eval-->SNF  -IVF--saline lock  UTI- Klebsiella pneumoniae  - Discontinue ceftriaxone  - Start ciprofloxacin 250 mg twice a day - Patient will be discharged on Bactrim DS  hypertension  - Continue nebivolol  - Blood pressure stable  PAF  -CHADS-VASc = 3  -pt had rectal bleed earlier 2014 and stopped Xarelto  - Discussed the risks, benefits, and alternatives with the patient--she does not want to restart anticoagulation  - Continue aspirin  -echo--EF 50-55%, no WMA, grade 1 diastolic dysfunction, mild PA HTN  OSA  -CPAP at night  BMI 70 and over, adult  -nutrition evaluation  Family Communication: Pt at beside  Disposition Plan: Home when medically stable  Antibiotics:  Ceftriaxone 03/08/2013>>> 03/12/2013  Vancomycin 03/08/2013>>>  Ciprofloxacin 03/12/2013>>>   Discharge Condition: Stable  Disposition:  skilled nursing facility  Diet: Heart healthy Wt Readings from Last 3 Encounters:  03/09/13 180.9 kg (398 lb 13 oz)  06/08/12 174.1 kg (383 lb 13.1 oz)  09/02/11 182.8 kg (403 lb)    History of present illness:   62 yo female with HTN, HLD, morbid obesity who presents to [facility] ED with main concern of several episodes of fall at home over the past few weeks. The last episode occurred earlier prior to this admission. Pt explains she was in the bathroom and felt like her legs were "giving out". She reports associated subjective fevers, chill, lower extremity swelling and redness, malaise, poor oral intake. She also explains she felt like she had stomach virus few days ago as she was having stomach cramps, muscle aches, diarrhea and vomiting but this appears to have resolved at  this point. She denies chest pain or shortness of breath, no specific abdominal concerns at this time, no specific urinary concerns except urinary urgency. The patient was noted have cellulitis of her lower extremities. She was started on intravenous vancomycin. On the day of admission, she  did have leukocytosis with WBC 16.8 and soft blood pressures. The patient was started on intravenous fluids. Wound care consultation was obtained. UNNA boots were placed on the patient's legs. The patient will require Unna boot dressings twice per week. Blood cultures remained negative. Urine cultures grew Klebsiella pneumoniae. The patient was initially started on ceftriaxone. This was the escalated to ciprofloxacin. At the time of discharge, the patient will be changed to Bactrim DS, 2 tablets twice a day which will cover both her cellulitis as well as her urine isolate. The patient's WBC count decreased and normalized. The patient's blood pressure remained stable on her beta blocker. Regarding her atrial fibrillation, the patient remained in sinus rhythm. The patient was remained on aspirin. Overall, the patient's clinical condition improved. Her diet was advanced and she tolerated it well.    Discharge Exam: Filed Vitals:   03/13/13 0529  BP: 118/73  Pulse: 82  Temp: 98 F (36.7 C)  Resp: 20   Filed Vitals:   03/12/13 0511 03/12/13 1414 03/12/13 2041 03/13/13 0529  BP: 106/57 133/67 133/57 118/73  Pulse: 77 78 80 82  Temp: 98.1 F (36.7 C) 98.2 F (36.8 C) 99.3 F (37.4 C) 98 F (36.7 C)  TempSrc: Oral Oral Oral Oral  Resp: 20 18 18 20   Height:      Weight:      SpO2: 98% 100% 99% 98%   General: A&O x 3, NAD, pleasant, cooperative Cardiovascular: RRR, no rub, no gallop, no S3 Respiratory: CTAB, no wheeze, no rhonchi Abdomen:soft, nontender, nondistended, positive bowel sounds Extremities: Lower extremities below knees covered but Unna boots. No crepitance. No lymphangitis. 22+ LE edema   Discharge Instructions      Discharge Orders   Future Orders Complete By Expires   Diet - low sodium heart healthy  As directed    Increase activity slowly  As directed        Medication List         aspirin 325 MG tablet  Take 325 mg by mouth daily.     citalopram 10 MG tablet   Commonly known as:  CELEXA  Take 10 mg by mouth daily.     diphenhydrAMINE 25 mg capsule  Commonly known as:  BENADRYL  Take 25 mg by mouth every 6 (six) hours as needed for allergies.     gabapentin 300 MG capsule  Commonly known as:  NEURONTIN  Take 300 mg by mouth at bedtime as needed (nerve pain).     HYDROcodone-acetaminophen 5-325 MG per tablet  Commonly known as:  NORCO/VICODIN  Take 1-2 tablets by mouth every 4 (four) hours as needed.     nebivolol 10 MG tablet  Commonly known as:  BYSTOLIC  Take 5 mg by mouth daily.     sulfamethoxazole-trimethoprim 800-160 MG per tablet  Commonly known as:  BACTRIM DS  Take 2 tablets by mouth every 12 (twelve) hours.     Vitamin D-3 5000 UNITS Tabs  Take 1 capsule by mouth daily.         The results of significant diagnostics from this hospitalization (including imaging, microbiology, ancillary and laboratory) are listed below for reference.    Significant Diagnostic Studies: Dg Chest [facility]  1 View  03/08/2013   CLINICAL DATA:  Cough and shortness of breast; recent trauma  EXAM: PORTABLE CHEST - 1 VIEW  COMPARISON:  September 07, 2011 and June 07, 2012  FINDINGS: There is cardiomegaly with mild pulmonary venous hypertension. There is no frank edema or consolidation. No appreciable effusions. No adenopathy. There are surgical clips in the right axilla.  IMPRESSION: No edema or consolidation. There is, however, cardiomegaly with a degree of pulmonary venous hypertension. Question early volume overload.   Electronically Signed   By: Bretta Bang   On: 03/08/2013 18:30     Microbiology: Recent Results (from the past 240 hour(s))  CULTURE, BLOOD (ROUTINE X 2)     Status: None   Collection Time    03/08/13  3:55 PM      Result Value Range Status   Specimen Description BLOOD R UPPER   Final   Special Requests NONE BOTTLES DRAWN AEROBIC AND ANAEROBIC 2CC   Final   Culture  Setup Time     Final   Value: 03/09/2013 03:42      Performed at [facility]   Culture     Final   Value:        BLOOD CULTURE RECEIVED NO GROWTH TO DATE CULTURE WILL BE HELD FOR 5 DAYS BEFORE ISSUING A FINAL NEGATIVE REPORT     Performed at [facility]   Report Status PENDING   Incomplete  CULTURE, BLOOD (ROUTINE X 2)     Status: None   Collection Time    03/08/13  4:35 PM      Result Value Range Status   Specimen Description BLOOD LEFT ARM   Final   Special Requests BOTTLES DRAWN AEROBIC AND ANAEROBIC 5CC   Final   Culture  Setup Time     Final   Value: 03/09/2013 03:42     Performed at [facility]   Culture     Final   Value:        BLOOD CULTURE RECEIVED NO GROWTH TO DATE CULTURE WILL BE HELD FOR 5 DAYS BEFORE ISSUING A FINAL NEGATIVE REPORT     Performed at [facility]   Report Status PENDING   Incomplete  URINE CULTURE     Status: None   Collection Time    03/08/13  5:15 PM      Result Value Range Status   Specimen Description URINE, CATHETERIZED   Final   Special Requests NONE   Final   Culture  Setup Time     Final   Value: 03/09/2013 01:27     Performed at [facility] Count     Final   Value: >=100,000 COLONIES/ML     Performed at [facility]   Culture     Final   Value: KLEBSIELLA PNEUMONIAE     Performed at [facility]   Report Status 03/11/2013 FINAL   Final   Organism ID, Bacteria KLEBSIELLA PNEUMONIAE   Final     Labs: Basic Metabolic Panel:  Recent Labs Lab 03/08/13 2300 03/09/13 0422 03/10/13 0457 03/11/13 0513 03/12/13 0717 03/13/13 0425  NA  --  136 137 139 139 137  K  --  3.8 3.5 3.7 3.7 3.3*  CL  --  106 110 111 109 110  CO2  --  18* 19 19 20 20   GLUCOSE  --  83 85 90 90 88  BUN  --  24* 19 15  12 12  CREATININE  --  1.15* 1.12* 1.07 1.02 1.11*  CALCIUM  --  8.4 8.4 8.7 8.6 8.4  MG 2.1  --   --   --   --   --   PHOS 3.3  --   --   --   --   --    Liver Function Tests:  Recent Labs Lab 03/08/13 1555  AST 15  ALT 16   ALKPHOS 121*  BILITOT 0.3  PROT 7.2  ALBUMIN 1.8*   No results found for this basename: LIPASE, AMYLASE,  in the last 168 hours No results found for this basename: AMMONIA,  in the last 168 hours CBC:  Recent Labs Lab 03/08/13 1555 03/09/13 0422 03/10/13 0457 03/11/13 0513 03/12/13 0717  WBC 16.8* 14.5* 12.5* 11.6* 9.4  NEUTROABS 14.4*  --   --   --   --   HGB 11.0* 10.6* 10.0* 11.2* 11.4*  HCT 33.3* 32.1* 31.0* 36.4 37.3  MCV 91.7 92.8 94.2 95.8 97.4  PLT 344 343 352 369 359   Cardiac Enzymes:  Recent Labs Lab 03/08/13 2300 03/09/13 0422 03/09/13 0925  TROPONINI <0.30 <0.30 <0.30   BNP: No components found with this basename: POCBNP,  CBG: No results found for this basename: GLUCAP,  in the last 168 hours  Time coordinating discharge:  Greater than 30 minutes  Signed:  Tria Noguera, DO [facility] Pager: [phone] 03/13/2013, 8:06 AM

## 2013-03-12 NOTE — Progress Notes (Signed)
Brief Pharmacy Note - Vancomycin  Labs: Vanc Trough 22.5  A/P: Vanc trough still supratherapeutic (goal 15-20). Last dose of vanc was 1250mg  given on 9/20 at 0540. Continue to hold vancomycin for now. With plan per Md to d/c patient tomorrow on PO abx's will not recheck another level tomorrow   Hessie Knows, PharmD, BCPS Pager [phone] 03/12/2013 7:00 PM

## 2013-03-12 NOTE — Progress Notes (Signed)
Pt refused CPAP, RT to monitor and assess as needed 

## 2013-03-12 NOTE — Progress Notes (Addendum)
ANTIBIOTIC CONSULT NOTE - FOLLOW UP  Pharmacy Consult for Vancomycin Indication: Cellulitis, UTI  Allergies  Allergen Reactions  . Effexor [Venlafaxine Hydrochloride] Anxiety    Made her want to kill herself    Patient Measurements: Height: 5\' 3"  (160 cm) Weight: 398 lb 13 oz (180.9 kg) IBW/kg (Calculated) : 52.4  Vital Signs: Temp: 98.1 F (36.7 C) (09/21 0511) Temp src: Oral (09/21 0511) BP: 106/57 mmHg (09/21 0511) Pulse Rate: 77 (09/21 0511) Intake/Output from previous day: 09/20 0701 - 09/21 0700 In: 1445 [P.O.:360; I.V.:885; IV Piggyback:200] Out: 300 [Urine:300]  Labs:  Recent Labs  03/10/13 0457 03/11/13 0513  WBC 12.5* 11.6*  HGB 10.0* 11.2*  PLT 352 369  CREATININE 1.12* 1.07   Estimated Creatinine Clearance: 87 ml/min (by C-G formula based on Cr of 1.07).  Recent Labs  03/11/13 1700 03/12/13 0717  VANCOTROUGH 34.0*  --   VANCORANDOM  --  25.8     Microbiology: 9/17 blood: NGTD 9/17 urine:  100k Kleb pneumoniae (Sens: cefazolin, ceftriaxone, cipro, gent, levo, P/T, tob, tmp/sul)  Anti-infectives: 9/17 >> Vancomycin >> 9/17 >> Rocephin  >>   Assessment: 34 YOF presented 9/17 with frequent falls (6x in the last week), per physician's physical exam, patient is noted to be morbidly obese with stool/mucus between legs as well as L leg erythema. Both lower extremities noted to have edema. Pharmacy asked to dose vancomycin for cellulitis and UTI (UA c/w infection).  Day #5 Vancomycin (Rx dosing) and Ceftriaxone (MD dosing).  SCr continues to improve, CrCl ~ 63 ml/min Normalized  WBC improved to wnl  Urine culture with Klebsiella  Vancomycin trough level 9/20 elevated, random level 9/20 remains elevated.   Goal of Therapy:  Vancomycin trough level 10-15 mcg/ml  Plan:   Continue to hold vancomycin  Recheck Vanc level today at 1800   Follow up renal fxn and culture results.   MD, can antibiotic therapy be narrowed today?     Lynann Beaver PharmD, BCPS Pager [phone] 03/12/2013 7:08 AM

## 2013-03-12 NOTE — Progress Notes (Signed)
Pt in a fib, HR dropping into the 30's with occasional pauses of 2.40 secs, HR does not sustain and goes back up into the 60/70's. VSS, no complaints. NP on call notifed, no new orders at this time. Will continue to monitor.  Zakry Caso, [place] RN

## 2013-03-12 NOTE — Progress Notes (Signed)
[facility] PROGRESS NOTE  Tsering Leaman ZOX:[id number] DOB: 28-Mar-1951 DOA: 03/08/2013 PCP: Marletta Lor, NP  Assessment/Plan: Cellulitis--left lower extremity  -leg erythema improving - Although somewhat difficult to totally differentiate from the patient's baseline venous stasis dermatitis, LLE>RLE erythema and edema  - Venous Doppler lower extremities--neg for DVT  - Continue vancomycin while the patient is in house -WBC improved -appreciate wound care consult  - Plan to discharge the patient with Bactrim DS, 2 tablets twice a day which will also cover her urine isolate as well as her cellulitis AKI  - Improved with IV fluids  - Serum creatinine 0.87 on 06/08/2012  - Baseline creatinine 0.8-1.0  Gait instability/lower extremity weakness/falls  -Multifactorial including deconditioning, DJD knees, cellulitis, UTI  -PT eval-->SNF  -IVF--saline lock  UTI- Klebsiella pneumoniae - Discontinue ceftriaxone - Start ciprofloxacin 250 mg twice a day hypertension  - Continue nebivolol  - Blood pressure stable  PAF  -CHADS-VASc = 3  -pt had rectal bleed earlier 2014 and stopped Xarelto  - Discussed the risks, benefits, and alternatives with the patient--she does not want to restart anticoagulation  - Continue aspirin  -echo--EF 50-55%, no WMA, grade 1 diastolic dysfunction, mild PA HTN  OSA  -CPAP at night  BMI 70 and over, adult  -nutrition evaluation  Family Communication: Pt at beside  Disposition Plan: Home when medically stable  Antibiotics:  Ceftriaxone 03/08/2013>>> 03/12/2013 Vancomycin 03/08/2013>>>  Ciprofloxacin 03/12/2013>>> Family Communication: Pt at beside  Disposition Plan: SNF 9/22        Procedures/Studies: Dg Chest Port 1 View  03/08/2013   CLINICAL DATA:  Cough and shortness of breast; recent trauma  EXAM: PORTABLE CHEST - 1 VIEW  COMPARISON:  September 07, 2011 and June 07, 2012  FINDINGS: There is cardiomegaly with mild pulmonary venous  hypertension. There is no frank edema or consolidation. No appreciable effusions. No adenopathy. There are surgical clips in the right axilla.  IMPRESSION: No edema or consolidation. There is, however, cardiomegaly with a degree of pulmonary venous hypertension. Question early volume overload.   Electronically Signed   By: Bretta Bang   On: 03/08/2013 18:30         Subjective: Patient denies fevers, chills, chest discomfort, dizziness, nausea, vomiting, diarrhea, abdominal pain, dysuria, hematuria.  Objective: Filed Vitals:   03/11/13 1300 03/11/13 2220 03/12/13 0511 03/12/13 1414  BP: 111/51 116/58 106/57 133/67  Pulse: 76 83 77 78  Temp: 98.1 F (36.7 C) 98.1 F (36.7 C) 98.1 F (36.7 C) 98.2 F (36.8 C)  TempSrc: Oral Oral Oral Oral  Resp: 16 20 20 18   Height:      Weight:      SpO2: 97% 99% 98% 100%    Intake/Output Summary (Last 24 hours) at 03/12/13 1831 Last data filed at 03/12/13 1643  Gross per 24 hour  Intake   1245 ml  Output    350 ml  Net    895 ml   Weight change:  Exam:   General:  Pt is alert, follows commands appropriately, not in acute distress  HEENT: No icterus, No thrush,  [place]/AT  Cardiovascular: RRR, S1/S2, no rubs, no gallops  Respiratory: CTA bilaterally, no wheezing, no crackles, no rhonchi  Abdomen: Soft/+BS, non tender, non distended, no guarding  Extremities: Lower extremities below knees covered but Unna boots. No crepitance. No lymphangitis.  Data Reviewed: Basic Metabolic Panel:  Recent Labs Lab 03/08/13 1555 03/08/13 2300 03/09/13 0422 03/10/13 0457 03/11/13 0513 03/12/13 0717  NA 132*  --  136 137 139 139  K 3.7  --  3.8 3.5 3.7 3.7  CL 101  --  106 110 111 109  CO2 16*  --  18* 19 19 20   GLUCOSE 102*  --  83 85 90 90  BUN 32*  --  24* 19 15 12   CREATININE 1.57*  --  1.15* 1.12* 1.07 1.02  CALCIUM 8.7  --  8.4 8.4 8.7 8.6  MG  --  2.1  --   --   --   --   PHOS  --  3.3  --   --   --   --    Liver Function  Tests:  Recent Labs Lab 03/08/13 1555  AST 15  ALT 16  ALKPHOS 121*  BILITOT 0.3  PROT 7.2  ALBUMIN 1.8*   No results found for this basename: LIPASE, AMYLASE,  in the last 168 hours No results found for this basename: AMMONIA,  in the last 168 hours CBC:  Recent Labs Lab 03/08/13 1555 03/09/13 0422 03/10/13 0457 03/11/13 0513 03/12/13 0717  WBC 16.8* 14.5* 12.5* 11.6* 9.4  NEUTROABS 14.4*  --   --   --   --   HGB 11.0* 10.6* 10.0* 11.2* 11.4*  HCT 33.3* 32.1* 31.0* 36.4 37.3  MCV 91.7 92.8 94.2 95.8 97.4  PLT 344 343 352 369 359   Cardiac Enzymes:  Recent Labs Lab 03/08/13 2300 03/09/13 0422 03/09/13 0925  TROPONINI <0.30 <0.30 <0.30   BNP: No components found with this basename: POCBNP,  CBG: No results found for this basename: GLUCAP,  in the last 168 hours  Recent Results (from the past 240 hour(s))  CULTURE, BLOOD (ROUTINE X 2)     Status: None   Collection Time    03/08/13  3:55 PM      Result Value Range Status   Specimen Description BLOOD R UPPER   Final   Special Requests NONE BOTTLES DRAWN AEROBIC AND ANAEROBIC 2CC   Final   Culture  Setup Time     Final   Value: 03/09/2013 03:42     Performed at [facility]   Culture     Final   Value:        BLOOD CULTURE RECEIVED NO GROWTH TO DATE CULTURE WILL BE HELD FOR 5 DAYS BEFORE ISSUING A FINAL NEGATIVE REPORT     Performed at [facility]   Report Status PENDING   Incomplete  CULTURE, BLOOD (ROUTINE X 2)     Status: None   Collection Time    03/08/13  4:35 PM      Result Value Range Status   Specimen Description BLOOD LEFT ARM   Final   Special Requests BOTTLES DRAWN AEROBIC AND ANAEROBIC 5CC   Final   Culture  Setup Time     Final   Value: 03/09/2013 03:42     Performed at [facility]   Culture     Final   Value:        BLOOD CULTURE RECEIVED NO GROWTH TO DATE CULTURE WILL BE HELD FOR 5 DAYS BEFORE ISSUING A FINAL NEGATIVE REPORT     Performed at [facility]    Report Status PENDING   Incomplete  URINE CULTURE     Status: None   Collection Time    03/08/13  5:15 PM      Result Value Range Status   Specimen Description URINE, CATHETERIZED   Final   Special Requests  NONE   Final   Culture  Setup Time     Final   Value: 03/09/2013 01:27     Performed at [facility] Count     Final   Value: >=100,000 COLONIES/ML     Performed at [facility]   Culture     Final   Value: KLEBSIELLA PNEUMONIAE     Performed at [facility]   Report Status 03/11/2013 FINAL   Final   Organism ID, Bacteria KLEBSIELLA PNEUMONIAE   Final     Scheduled Meds: . aspirin  325 mg Oral Daily  . ciprofloxacin  250 mg Oral BID  . citalopram  10 mg Oral Daily  . enoxaparin (LOVENOX) injection  80 mg Subcutaneous Q24H  . nebivolol  5 mg Oral Daily  . sodium chloride  3 mL Intravenous Q12H   Continuous Infusions: . sodium chloride 50 mL/hr at 03/12/13 0313     Saim Almanza, DO  [facility] Pager [phone]  If 7PM-7AM, please contact night-coverage [email or web address] Password [facility] 03/12/2013, 6:31 PM   LOS: 4 days

## 2013-03-13 DIAGNOSIS — I8310 Varicose veins of unspecified lower extremity with inflammation: Secondary | ICD-10-CM

## 2013-03-13 LAB — BASIC METABOLIC PANEL
Calcium: 8.4 mg/dL (ref 8.4–10.5)
Chloride: 110 mEq/L (ref 96–112)
Creatinine, Ser: 1.11 mg/dL — ABNORMAL HIGH (ref 0.50–1.10)
GFR calc Af Amer: 59 mL/min — ABNORMAL LOW (ref >90)
GFR calc non Af Amer: 51 mL/min — ABNORMAL LOW (ref >90)
Sodium: 137 mEq/L (ref 135–145)

## 2013-03-13 MED ORDER — NEBIVOLOL HCL 5 MG PO TABS: 5.0000 mg | ORAL_TABLET | Freq: Every day | ORAL | Status: DC

## 2013-03-13 MED ORDER — DIPHENHYDRAMINE HCL 25 MG PO CAPS: 25.0000 mg | ORAL_CAPSULE | Freq: Four times a day (QID) | ORAL | Status: AC | PRN

## 2013-03-13 MED ORDER — SULFAMETHOXAZOLE-TMP DS 800-160 MG PO TABS: 2.0000 | ORAL_TABLET | Freq: Two times a day (BID) | ORAL | Status: DC

## 2013-03-13 MED ORDER — ASPIRIN 325 MG PO TABS: 325.0000 mg | ORAL_TABLET | Freq: Every day | ORAL | Status: DC

## 2013-03-13 MED ORDER — POTASSIUM CHLORIDE CRYS ER 20 MEQ PO TBCR
20.0000 meq | EXTENDED_RELEASE_TABLET | Freq: Once | ORAL | Status: AC
  Administered 2013-03-13: 08:00:00 20 meq via ORAL
  Filled 2013-03-13: qty 1

## 2013-03-13 MED ORDER — HYDROCODONE-ACETAMINOPHEN 5-325 MG PO TABS: 1.0000 | ORAL_TABLET | ORAL | Status: DC | PRN

## 2013-03-13 MED ORDER — CITALOPRAM HYDROBROMIDE 10 MG PO TABS: 10.0000 mg | ORAL_TABLET | Freq: Every day | ORAL | Status: DC

## 2013-03-13 MED ORDER — GABAPENTIN 300 MG PO CAPS: 300.0000 mg | ORAL_CAPSULE | Freq: Every evening | ORAL | Status: AC | PRN

## 2013-03-13 MED ORDER — PNEUMOCOCCAL VAC POLYVALENT 25 MCG/0.5ML IJ INJ
0.5000 mL | INJECTION | Freq: Once | INTRAMUSCULAR | Status: AC
  Administered 2013-03-13: 13:00:00 0.5 mL via INTRAMUSCULAR
  Filled 2013-03-13: qty 0.5

## 2013-03-13 MED ORDER — SULFAMETHOXAZOLE-TMP DS 800-160 MG PO TABS
2.0000 | ORAL_TABLET | Freq: Two times a day (BID) | ORAL | Status: DC
  Administered 2013-03-13: 2 via ORAL
  Filled 2013-03-13 (×2): qty 2

## 2013-03-13 MED ORDER — VITAMIN D-3 125 MCG (5000 UT) PO TABS: 1.0000 | ORAL_TABLET | Freq: Every day | ORAL | Status: DC

## 2013-03-13 NOTE — Progress Notes (Signed)
Report called to RN at [facility] [employer]. Discharge reviewed and all questions answered. Julio Sicks RN

## 2013-03-13 NOTE — Progress Notes (Signed)
CSW met with patient. She is upset that CSW can not make a recommendation on a facility. She called friends and decided upon [facility]. Patient is cleared for discharge.  Sheng Pritz C. Jaisa Defino MSW, LCSW [phone]

## 2013-03-13 NOTE — Progress Notes (Signed)
Packet copied and placed in wall. Patient agreeable to transfer. RN called transport.  Captola Teschner C. Tanya Crothers MSW, LCSW [phone]

## 2013-03-13 NOTE — Clinical Social Work Placement (Signed)
     Clinical Social Work Department CLINICAL SOCIAL WORK PLACEMENT NOTE 03/13/2013  Patient:  [name], [name]  Account Number:  [id number] Admit date:  03/08/2013  Clinical Social Worker:  Becky Sax, LCSW  Date/time:  03/13/2013 12:00 M  Clinical Social Work is seeking post-discharge placement for this patient at the following level of care:   SKILLED NURSING   (*CSW will update this form in Epic as items are completed)   03/13/2013  Patient/family provided with [facility] Department of Clinical Social Works list of facilities offering this level of care within the geographic area requested by the patient (or if unable, by the patients family).  03/13/2013  Patient/family informed of their freedom to choose among providers that offer the needed level of care, that participate in Medicare, Medicaid or managed care program needed by the patient, have an available bed and are willing to accept the patient.  03/13/2013  Patient/family informed of [facility] ownership interest in [facility], as well as of the fact that they are under no obligation to receive care at this facility.  PASARR submitted to EDS on 03/13/2013 PASARR number received from EDS on 03/13/2013  FL2 transmitted to all facilities in geographic area requested by pt/family on  03/13/2013 FL2 transmitted to all facilities within larger geographic area on 03/13/2013  Patient informed that his/her managed care company has contracts with or will negotiate with  certain facilities, including the following:     Patient/family informed of bed offers received:  03/13/2013 Patient chooses bed at [facility] Physician recommends and patient chooses bed at    Patient to be transferred to [facility] on  03/13/2013 Patient to be transferred to facility by ptar  The following physician request were entered in Epic:   Additional Comments:

## 2013-03-15 LAB — CULTURE, BLOOD (ROUTINE X 2)

## 2013-04-04 ENCOUNTER — Encounter (HOSPITAL_BASED_OUTPATIENT_CLINIC_OR_DEPARTMENT_OTHER): Payer: Medicare Other | Attending: General Surgery

## 2013-04-04 DIAGNOSIS — L97909 Non-pressure chronic ulcer of unspecified part of unspecified lower leg with unspecified severity: Secondary | ICD-10-CM | POA: Insufficient documentation

## 2013-04-04 DIAGNOSIS — Z853 Personal history of malignant neoplasm of breast: Secondary | ICD-10-CM | POA: Insufficient documentation

## 2013-04-04 DIAGNOSIS — I872 Venous insufficiency (chronic) (peripheral): Secondary | ICD-10-CM | POA: Insufficient documentation

## 2013-04-04 DIAGNOSIS — I4891 Unspecified atrial fibrillation: Secondary | ICD-10-CM | POA: Insufficient documentation

## 2013-04-04 DIAGNOSIS — I252 Old myocardial infarction: Secondary | ICD-10-CM | POA: Insufficient documentation

## 2013-04-04 DIAGNOSIS — Z79899 Other long term (current) drug therapy: Secondary | ICD-10-CM | POA: Insufficient documentation

## 2013-04-04 DIAGNOSIS — Z923 Personal history of irradiation: Secondary | ICD-10-CM | POA: Insufficient documentation

## 2013-04-04 DIAGNOSIS — G4730 Sleep apnea, unspecified: Secondary | ICD-10-CM | POA: Insufficient documentation

## 2013-04-04 DIAGNOSIS — I10 Essential (primary) hypertension: Secondary | ICD-10-CM | POA: Insufficient documentation

## 2013-04-05 ENCOUNTER — Other Ambulatory Visit (HOSPITAL_COMMUNITY): Payer: Self-pay | Admitting: General Surgery

## 2013-04-05 DIAGNOSIS — I83009 Varicose veins of unspecified lower extremity with ulcer of unspecified site: Secondary | ICD-10-CM

## 2013-04-05 NOTE — H&P (Signed)
[name], [name]              ACCOUNT NO.:  [id number]  MEDICAL RECORD NO.:  [id number]  LOCATION:  FOOT                         FACILITY:  [facility]  PHYSICIAN:  Joanne Gavel, M.D.        DATE OF BIRTH:  May 22, 1951  DATE OF ADMISSION:  04/04/2013 DATE OF DISCHARGE:                             HISTORY & PHYSICAL   CHIEF COMPLAINT:  Sores, both legs.  HISTORY OF PRESENT ILLNESS:  This is a 62 year old female, who is staying right now in a nursing home.  She has had sores on both legs for at least the year and a half.  She has had multiple episodes of venous stasis ulcers in a quite knowledgeable on the subject.  She was hospitalized after having several episodes of falling and dizziness. She does not have stroke.  She has a history of myocardial infarction, and several other medical problems.  She was treated for breast cancer in 1988 including lumpectomy and radiation, has had some mild swelling of the right arm.  Cigarettes none.  Alcohol none.  ALLERGIES:  Effexor cause suicidal thoughts.  MEDICATIONS:  Gabapentin, hydrocodone, lorazepam, and Benadryl.  REVIEW OF SYSTEMS:  Review of systems reveals history of paced atrial fibrillation, morbid obesity, episodes of cellulitis, hypertension, sleep apnea, and depressive disorder.  PHYSICAL EXAMINATION:  GENERAL APPEARANCE:  Well developed, somewhat obese.  The patient is not very ambulatory.  She is in a wheelchair and on the stretcher. VITAL SIGNS:  Stated weight is 342 pounds, temperature is 98.1, pulse 95 and slightly irregular, respirations 19, blood pressure 105/70. CHEST:  Clear. HEART:  Heart sounds are distinct and slightly irregular.  She has multiple ulcerations of both legs, going from 0.5 x 0.2 to 3.8 x 2.3 same is true of the left leg.  Some of these wounds are quite deep up to 0.5 cm x 0.7 cm deep and draining a great deal.  She states that these represent an improvement over previous state.  She has bounding  pulses at posterior tibial and dorsalis pedis.  My impression is severe stasis disease probably with impending lymph edema.  PLAN OF TREATMENT:  We will treat the wounds with Santyl.  Many of the need debriding which she will not allow at present and Unna boots.  We believe that the Unna boots will have to be changed quite frequently because of the patient's left have great deal of drainage.  We will see her in 7 days.     Joanne Gavel, M.D.     RA/MEDQ  D:  04/04/2013  T:  04/05/2013  Job:  [id number]

## 2013-04-06 ENCOUNTER — Encounter (HOSPITAL_COMMUNITY): Payer: Self-pay | Admitting: Emergency Medicine

## 2013-04-06 ENCOUNTER — Emergency Department (HOSPITAL_COMMUNITY): Payer: Medicare Other

## 2013-04-06 ENCOUNTER — Inpatient Hospital Stay (HOSPITAL_COMMUNITY)
Admission: EM | Admit: 2013-04-06 | Discharge: 2013-04-08 | DRG: 300 | Disposition: A | Discharge status: Skilled Nursing Facility | Payer: Medicare Other | Attending: Internal Medicine | Admitting: Internal Medicine

## 2013-04-06 ENCOUNTER — Ambulatory Visit (HOSPITAL_BASED_OUTPATIENT_CLINIC_OR_DEPARTMENT_OTHER)
Admission: RE | Admit: 2013-04-06 | Discharge: 2013-04-06 | Disposition: A | Discharge status: Home / Self Care | Payer: Medicare Other | Source: Ambulatory Visit | Attending: Internal Medicine | Admitting: Internal Medicine

## 2013-04-06 DIAGNOSIS — L03119 Cellulitis of unspecified part of limb: Secondary | ICD-10-CM

## 2013-04-06 DIAGNOSIS — Z853 Personal history of malignant neoplasm of breast: Secondary | ICD-10-CM

## 2013-04-06 DIAGNOSIS — N179 Acute kidney failure, unspecified: Secondary | ICD-10-CM

## 2013-04-06 DIAGNOSIS — L03113 Cellulitis of right upper limb: Secondary | ICD-10-CM

## 2013-04-06 DIAGNOSIS — S21009S Unspecified open wound of unspecified breast, sequela: Secondary | ICD-10-CM

## 2013-04-06 DIAGNOSIS — N61 Mastitis without abscess: Secondary | ICD-10-CM | POA: Diagnosis present

## 2013-04-06 DIAGNOSIS — Z7982 Long term (current) use of aspirin: Secondary | ICD-10-CM

## 2013-04-06 DIAGNOSIS — M7989 Other specified soft tissue disorders: Secondary | ICD-10-CM

## 2013-04-06 DIAGNOSIS — Z6841 Body Mass Index (BMI) 40.0 and over, adult: Secondary | ICD-10-CM

## 2013-04-06 DIAGNOSIS — E872 Acidosis: Secondary | ICD-10-CM

## 2013-04-06 DIAGNOSIS — I4891 Unspecified atrial fibrillation: Secondary | ICD-10-CM | POA: Diagnosis present

## 2013-04-06 DIAGNOSIS — I480 Paroxysmal atrial fibrillation: Secondary | ICD-10-CM

## 2013-04-06 DIAGNOSIS — Z87891 Personal history of nicotine dependence: Secondary | ICD-10-CM

## 2013-04-06 DIAGNOSIS — I8310 Varicose veins of unspecified lower extremity with inflammation: Secondary | ICD-10-CM

## 2013-04-06 DIAGNOSIS — G4733 Obstructive sleep apnea (adult) (pediatric): Secondary | ICD-10-CM | POA: Diagnosis present

## 2013-04-06 DIAGNOSIS — N390 Urinary tract infection, site not specified: Secondary | ICD-10-CM

## 2013-04-06 DIAGNOSIS — L02419 Cutaneous abscess of limb, unspecified: Secondary | ICD-10-CM | POA: Diagnosis present

## 2013-04-06 DIAGNOSIS — I82409 Acute embolism and thrombosis of unspecified deep veins of unspecified lower extremity: Principal | ICD-10-CM | POA: Diagnosis present

## 2013-04-06 DIAGNOSIS — I82401 Acute embolism and thrombosis of unspecified deep veins of right lower extremity: Secondary | ICD-10-CM

## 2013-04-06 DIAGNOSIS — W19XXXS Unspecified fall, sequela: Secondary | ICD-10-CM

## 2013-04-06 DIAGNOSIS — Z79899 Other long term (current) drug therapy: Secondary | ICD-10-CM

## 2013-04-06 DIAGNOSIS — A419 Sepsis, unspecified organism: Secondary | ICD-10-CM

## 2013-04-06 DIAGNOSIS — D72829 Elevated white blood cell count, unspecified: Secondary | ICD-10-CM

## 2013-04-06 DIAGNOSIS — I10 Essential (primary) hypertension: Secondary | ICD-10-CM | POA: Diagnosis present

## 2013-04-06 DIAGNOSIS — R921 Mammographic calcification found on diagnostic imaging of breast: Secondary | ICD-10-CM

## 2013-04-06 DIAGNOSIS — F329 Major depressive disorder, single episode, unspecified: Secondary | ICD-10-CM | POA: Diagnosis present

## 2013-04-06 DIAGNOSIS — I872 Venous insufficiency (chronic) (peripheral): Secondary | ICD-10-CM | POA: Diagnosis present

## 2013-04-06 DIAGNOSIS — M79609 Pain in unspecified limb: Secondary | ICD-10-CM

## 2013-04-06 DIAGNOSIS — I83009 Varicose veins of unspecified lower extremity with ulcer of unspecified site: Secondary | ICD-10-CM

## 2013-04-06 DIAGNOSIS — F411 Generalized anxiety disorder: Secondary | ICD-10-CM | POA: Diagnosis present

## 2013-04-06 DIAGNOSIS — F3289 Other specified depressive episodes: Secondary | ICD-10-CM | POA: Diagnosis present

## 2013-04-06 DIAGNOSIS — L03129 Acute lymphangitis of unspecified part of limb: Secondary | ICD-10-CM

## 2013-04-06 HISTORY — DX: Venous insufficiency (chronic) (peripheral): I87.2

## 2013-04-06 HISTORY — DX: Acute embolism and thrombosis of unspecified deep veins of right lower extremity: I82.401

## 2013-04-06 HISTORY — DX: Unspecified osteoarthritis, unspecified site: M19.90

## 2013-04-06 HISTORY — DX: Pneumonia, unspecified organism: J18.9

## 2013-04-06 HISTORY — DX: Unspecified chronic bronchitis: J42

## 2013-04-06 HISTORY — DX: Shortness of breath: R06.02

## 2013-04-06 LAB — URINE MICROSCOPIC-ADD ON

## 2013-04-06 LAB — CBC WITH DIFFERENTIAL/PLATELET
Basophils Absolute: 0 10*3/uL (ref 0.0–0.1)
Basophils Relative: 0 % (ref 0–1)
HCT: 35.3 % — ABNORMAL LOW (ref 36.0–46.0)
Lymphocytes Relative: 30 % (ref 12–46)
Lymphs Abs: 2.5 10*3/uL (ref 0.7–4.0)
Monocytes Absolute: 0.7 10*3/uL (ref 0.1–1.0)
Neutro Abs: 5 10*3/uL (ref 1.7–7.7)
Neutrophils Relative %: 59 % (ref 43–77)
Platelets: 204 10*3/uL (ref 150–400)
RDW: 15.4 % (ref 11.5–15.5)
WBC: 8.4 10*3/uL (ref 4.0–10.5)

## 2013-04-06 LAB — BASIC METABOLIC PANEL
CO2: 24 mEq/L (ref 19–32)
Chloride: 104 mEq/L (ref 96–112)
Creatinine, Ser: 1.04 mg/dL (ref 0.50–1.10)
GFR calc Af Amer: 64 mL/min — ABNORMAL LOW (ref >90)
Glucose, Bld: 95 mg/dL (ref 70–99)
Potassium: 4.1 mEq/L (ref 3.5–5.1)
Sodium: 138 mEq/L (ref 135–145)

## 2013-04-06 LAB — PROTIME-INR
INR: 1.21 (ref 0.00–1.49)
Prothrombin Time: 15 seconds (ref 11.6–15.2)

## 2013-04-06 LAB — URINALYSIS, ROUTINE W REFLEX MICROSCOPIC
Nitrite: POSITIVE — AB
Protein, ur: 30 mg/dL — AB
Specific Gravity, Urine: 1.03 (ref 1.005–1.030)
Urobilinogen, UA: 0.2 mg/dL (ref 0.0–1.0)

## 2013-04-06 LAB — HEPARIN LEVEL (UNFRACTIONATED): Heparin Unfractionated: 0.15 [IU]/mL — ABNORMAL LOW (ref 0.30–0.70)

## 2013-04-06 LAB — TSH: TSH: 1.617 u[IU]/mL (ref 0.350–4.500)

## 2013-04-06 MED ORDER — VANCOMYCIN HCL IN DEXTROSE 1-5 GM/200ML-% IV SOLN
1000.0000 mg | Freq: Once | INTRAVENOUS | Status: AC
  Administered 2013-04-06: 1000 mg via INTRAVENOUS
  Filled 2013-04-06: qty 200

## 2013-04-06 MED ORDER — ALUM & MAG HYDROXIDE-SIMETH 200-200-20 MG/5ML PO SUSP: 30.0000 mL | Freq: Four times a day (QID) | ORAL | Status: DC | PRN

## 2013-04-06 MED ORDER — ONDANSETRON HCL 4 MG PO TABS: 4.0000 mg | ORAL_TABLET | Freq: Four times a day (QID) | ORAL | Status: DC | PRN

## 2013-04-06 MED ORDER — HEPARIN (PORCINE) IN NACL 100-0.45 UNIT/ML-% IJ SOLN
2500.0000 [IU]/h | INTRAMUSCULAR | Status: AC
  Administered 2013-04-06: 1800 [IU]/h via INTRAVENOUS
  Administered 2013-04-07: 2100 [IU]/h via INTRAVENOUS
  Filled 2013-04-06 (×5): qty 250

## 2013-04-06 MED ORDER — NEBIVOLOL HCL 10 MG PO TABS
10.0000 mg | ORAL_TABLET | Freq: Every day | ORAL | Status: DC
  Administered 2013-04-07 – 2013-04-08 (×2): 10 mg via ORAL
  Filled 2013-04-06 (×3): qty 1

## 2013-04-06 MED ORDER — SULFAMETHOXAZOLE-TMP DS 800-160 MG PO TABS
1.0000 | ORAL_TABLET | Freq: Two times a day (BID) | ORAL | Status: DC
  Administered 2013-04-06 – 2013-04-08 (×4): 1 via ORAL
  Filled 2013-04-06 (×5): qty 1

## 2013-04-06 MED ORDER — HEPARIN BOLUS VIA INFUSION
5000.0000 [IU] | Freq: Once | INTRAVENOUS | Status: AC
  Administered 2013-04-06: 5000 [IU] via INTRAVENOUS
  Filled 2013-04-06: qty 5000

## 2013-04-06 MED ORDER — LORAZEPAM 0.5 MG PO TABS: 0.2500 mg | ORAL_TABLET | Freq: Two times a day (BID) | ORAL | Status: DC | PRN

## 2013-04-06 MED ORDER — VANCOMYCIN HCL 10 G IV SOLR
2500.0000 mg | Freq: Once | INTRAVENOUS | Status: DC
  Filled 2013-04-06: qty 2500

## 2013-04-06 MED ORDER — HYDROCODONE-ACETAMINOPHEN 5-325 MG PO TABS
1.0000 | ORAL_TABLET | ORAL | Status: DC | PRN
  Administered 2013-04-07: 1 via ORAL
  Filled 2013-04-06: qty 1

## 2013-04-06 MED ORDER — ACETAMINOPHEN 325 MG PO TABS: 650.0000 mg | ORAL_TABLET | Freq: Four times a day (QID) | ORAL | Status: DC | PRN

## 2013-04-06 MED ORDER — GABAPENTIN 300 MG PO CAPS
300.0000 mg | ORAL_CAPSULE | Freq: Every evening | ORAL | Status: DC | PRN
Start: 2013-04-06 — End: 2013-04-08

## 2013-04-06 MED ORDER — MORPHINE SULFATE 2 MG/ML IJ SOLN: 2.0000 mg | INTRAMUSCULAR | Status: DC | PRN

## 2013-04-06 MED ORDER — DIPHENHYDRAMINE HCL 25 MG PO CAPS: 25.0000 mg | ORAL_CAPSULE | Freq: Four times a day (QID) | ORAL | Status: DC | PRN

## 2013-04-06 MED ORDER — ONDANSETRON HCL 4 MG/2ML IJ SOLN: 4.0000 mg | Freq: Four times a day (QID) | INTRAMUSCULAR | Status: DC | PRN

## 2013-04-06 MED ORDER — VANCOMYCIN HCL 10 G IV SOLR
1500.0000 mg | INTRAVENOUS | Status: DC
  Filled 2013-04-06: qty 1500

## 2013-04-06 MED ORDER — SODIUM CHLORIDE 0.9 % IV SOLN
1500.0000 mg | Freq: Two times a day (BID) | INTRAVENOUS | Status: DC
  Filled 2013-04-06: qty 1500

## 2013-04-06 MED ORDER — CITALOPRAM HYDROBROMIDE 10 MG PO TABS
10.0000 mg | ORAL_TABLET | Freq: Every day | ORAL | Status: DC
  Administered 2013-04-07 – 2013-04-08 (×2): 10 mg via ORAL
  Filled 2013-04-06 (×2): qty 1

## 2013-04-06 MED ORDER — ACETAMINOPHEN 650 MG RE SUPP: 650.0000 mg | Freq: Four times a day (QID) | RECTAL | Status: DC | PRN

## 2013-04-06 MED ORDER — ASPIRIN 325 MG PO TABS
325.0000 mg | ORAL_TABLET | Freq: Every day | ORAL | Status: DC
  Administered 2013-04-07 – 2013-04-08 (×2): 325 mg via ORAL
  Filled 2013-04-06 (×2): qty 1

## 2013-04-06 NOTE — ED Notes (Signed)
Arrived via [facility]AR from MDs office. Pt was at wound care clinic this morning for cellulitis treatment. With pain in leg pt was transferred to Cardiology who confirmed DVT in the right leg and superficial vein thrombosis in the left leg. Pt transported to [facility] for treatment.

## 2013-04-06 NOTE — ED Provider Notes (Signed)
CSN: [id number]     Arrival date & time 04/06/13  1206 History   First MD Initiated Contact with Patient 04/06/13 1217     Chief Complaint  Patient presents with  . DVT   (Consider location/radiation/quality/duration/timing/severity/associated sxs/prior Treatment) The history is provided by the patient.  [name] is a 62 y.o. female h of HTN, obesity, depression, anxiety here with DVT and cellulitis. She was admitted a month ago for cellulitis and was discharged on course of Bactrim. Still on Bactrim but the legs are more swollen recently. Had an outpatient DVT study that was positive for DVT on the right and involving right femoral vein. It also showed a left superficial vein thrombosis. Denies any fevers or chills. No history of DVT and no shortness of breath.    Past Medical History  Diagnosis Date  . Obesities, morbid   . Hypertension   . Cellulitis     right breast, right arm, ble  . Depression   . Anxiety   . 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 Obstructive sleep apnea     CPAP  . Breast cancer 1988  . Dysrhythmia   . Obstructive sleep apnea on CPAP 06/04/2012  . PAF (paroxysmal atrial fibrillation) 06/04/2012   Past Surgical History  Procedure Laterality Date  . Right lumpectomy  1988  . Abdominal hysterectomy  1991  . Right carpal tunnel release  1995  . Axillary lymph node dissection  1988  . Wound debridement  09/03/2011    Procedure: DEBRIDEMENT WOUND;  Surgeon: Adolph Pollack, MD;  Location: [facility];  Service: General;  Laterality: Right;   Family History  Problem Relation Age of Onset  . Hypertension    . Other Mother   . Other Father    History  Substance Use Topics  . Smoking status: Former Smoker -- 0.50 packs/day for 40 years    Types: Cigarettes    Quit date: 07/01/2004  . Smokeless tobacco: Never Used  . Alcohol Use: No   OB History   Grav Para Term Preterm Abortions TAB SAB Ect Mult Living                 Review of Systems  Musculoskeletal:       Leg swelling   All other systems reviewed and are negative.    Allergies  Effexor  Home Medications   Current Outpatient Rx  Name  Route  Sig  Dispense  Refill  . aspirin 325 MG tablet   Oral   Take 1 tablet (325 mg total) by mouth daily.         . Cholecalciferol (VITAMIN D-3) 5000 UNITS TABS   Oral   Take 1 capsule by mouth daily.   30 tablet   0   . citalopram (CELEXA) 10 MG tablet   Oral   Take 1 tablet (10 mg total) by mouth daily.   30 tablet   0   . diphenhydrAMINE (BENADRYL) 25 mg capsule   Oral   Take 1 capsule (25 mg total) by mouth every 6 (six) hours as needed for allergies.   30 capsule   0   . gabapentin (NEURONTIN) 300 MG capsule   Oral   Take 1 capsule (300 mg total) by mouth at bedtime as needed (nerve pain).   30 capsule   0   .  HYDROcodone-acetaminophen (NORCO/VICODIN) 5-325 MG per tablet   Oral   Take 1-2 tablets by mouth every 4 (four) hours as needed for pain.         Marland Kitchen LORazepam (ATIVAN) 0.5 MG tablet   Oral   Take 0.25 mg by mouth every 12 (twelve) hours as needed for anxiety.         . nebivolol (BYSTOLIC) 10 MG tablet   Oral   Take 10 mg by mouth daily.           BP 127/76  Pulse 79  Temp(Src) 98.3 F (36.8 C) (Oral)  Resp 22  Ht 5\' 2"  (1.575 m)  Wt 345 lb (156.491 kg)  BMI 63.09 kg/m2  SpO2 95% Physical Exam  Nursing note and vitals reviewed. Constitutional: She is oriented to person, place, and time.  Obese, chronically ill   HENT:  Head: Normocephalic.  Mouth/Throat: Oropharynx is clear and moist.  Eyes: Conjunctivae are normal. Pupils are equal, round, and reactive to light.  Neck: Normal range of motion. Neck supple.  Cardiovascular: Normal rate, regular rhythm and normal heart sounds.   Pulmonary/Chest: Effort normal and breath sounds normal. No respiratory distress. She has no wheezes.  She has no rales.  Abdominal: Soft. Bowel sounds are normal. She exhibits no distension. There is no tenderness. There is no rebound and no guarding.  Obese   Musculoskeletal:  Bilateral legs swollen. Obvious foul smelling cellulitis and ulcers on  bilateral legs.   Neurological: She is alert and oriented to person, place, and time.  Skin: Skin is warm and dry.  Psychiatric: She has a normal mood and affect. Her behavior is normal. Judgment and thought content normal.    ED Course  Procedures (including critical care time)  CRITICAL CARE Performed by: Silverio Lay, DAVID   Total critical care time:30 min   Critical care time was exclusive of separately billable procedures and treating other patients.  Critical care was necessary to treat or prevent imminent or life-threatening deterioration.  Critical care was time spent personally by me on the following activities: development of treatment plan with patient and/or surrogate as well as nursing, discussions with consultants, evaluation of patient's response to treatment, examination of patient, obtaining history from patient or surrogate, ordering and performing treatments and interventions, ordering and review of laboratory studies, ordering and review of radiographic studies, pulse oximetry and re-evaluation of patient's condition.   Labs Review Labs Reviewed  CBC WITH DIFFERENTIAL - Abnormal; Notable for the following:    RBC 3.63 (*)    Hemoglobin 11.0 (*)    HCT 35.3 (*)    All other components within normal limits  BASIC METABOLIC PANEL - Abnormal; Notable for the following:    GFR calc non Af Amer 56 (*)    GFR calc Af Amer 64 (*)    All other components within normal limits  URINE CULTURE  PROTIME-INR  URINALYSIS, ROUTINE W REFLEX MICROSCOPIC  HEPARIN LEVEL (UNFRACTIONATED)   Imaging Review Dg Tibia/fibula Left  04/06/2013   CLINICAL DATA:  Ulcer. Rule out osteomyelitis.  EXAM: LEFT TIBIA AND FIBULA - 2 VIEW  COMPARISON:   None.  FINDINGS: Extensive soft tissue calcifications. Degenerative change about the knee. No osseous destruction. No callus deposition. No soft tissue gas. Extensive soft tissue calcifications.  IMPRESSION: No plain film evidence of osteomyelitis.   Electronically Signed   By: Jeronimo Greaves M.D.   On: 04/06/2013 13:33   Dg Tibia/fibula Right  04/06/2013   CLINICAL DATA:  Right leg ulcer. Rule out osteomyelitis.  EXAM: RIGHT TIBIA AND FIBULA - 2 VIEW  COMPARISON:  None.  FINDINGS: Advanced degenerate changes about the knee. Extensive soft tissue calcification. No osseous destruction identified. No periosteal reaction or callus deposition. No soft tissue gas.  IMPRESSION: No plain film evidence of osteomyelitis.   Electronically Signed   By: Jeronimo Greaves M.D.   On: 04/06/2013 13:32    EKG Interpretation   None       MDM  No diagnosis found. [name] is a 61 y.o. female here with leg cellulitis and DVT. Xray showed no osteo. DVT was extensive and she is obese so will need admission and will heparinize to prevent PE. Will also admit for IV abx for cellulitis.      Richardean Canal, MD 04/06/13 1430

## 2013-04-06 NOTE — Progress Notes (Signed)
Venous Duplex Lower Ext. Bilateral Completed. Positive for acute DVT in the right common femoral, profunda femoral, and femoral veins. The left leg is negative for DVT in veins that were clearly visualized. Th GSV at the saphenofemoral junction extending in the distal thigh is positive for superificial vein thrombosis.   Marilynne Halsted, BS, RDMS, RVT

## 2013-04-06 NOTE — Progress Notes (Addendum)
ANTICOAGULATION CONSULT NOTE - Initial Consult  Pharmacy Consult for Heparin+ Vancomycin Indication: DVT and cellulitis  Allergies  Allergen Reactions  . Effexor [Venlafaxine Hydrochloride] Anxiety    Made her want to kill herself    Patient Measurements: Height: 5\' 2"  (157.5 cm) Weight: 345 lb (156.491 kg) IBW/kg (Calculated) : 50.1 Heparin Dosing Weight:  100 kg  Vital Signs: Temp: 98.3 F (36.8 C) (10/16 1213) Temp src: Oral (10/16 1213) BP: 127/76 mmHg (10/16 1230) Pulse Rate: 79 (10/16 1230)  Labs:  Recent Labs  04/06/13 1258  HGB 11.0*  HCT 35.3*  PLT 204    Estimated Creatinine Clearance: 74.9 ml/min (by C-G formula based on Cr of 1.11).   Medical History: Past Medical History  Diagnosis Date  . Obesities, morbid   . Hypertension   . Cellulitis     right breast, right arm, ble  . Depression   . Anxiety   . 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 Obstructive sleep apnea     CPAP  . Breast cancer 1988  . Dysrhythmia   . Obstructive sleep apnea on CPAP 06/04/2012  . PAF (paroxysmal atrial fibrillation) 06/04/2012    Medications:  ASA 325mg , Vit D, Celexa, Benadryl, Neurontin, Vicodin, Ativan, Nebivolol  Assessment: 62 y/o F at wound care clinic c/o leg pain. Found +DVT. Plan to start heparin in morbid obesity.  Labs: Scr 1.57, Hgb 11 slightly low. Plts 204. Baseline INR 1.21  Goal of Therapy:  Heparin level 0.3-0.7 units/ml Monitor platelets by anticoagulation protocol: Yes Vancomycin trough 10-15   Plan:  1. Heparin 5000 unit IV bolus 2. Heparin infusion 1800 units/hr 3. Check heparin level 6-8 hrs after heparin starts 4. Daily heparin level and CBC 5. Vancomycin 2500mg  IV load then 1500mg  IV q12h. Trough after 3-5 doses at steady state.  [name] S. Merilynn Finland, PharmD, BCPS Clinical Staff Pharmacist Pager [phone]  Deborah Stanley  [name] 04/06/2013,1:20 PM

## 2013-04-06 NOTE — H&P (Signed)
[facility] History and Physical  [name] ZOX:[id number] DOB: Nov 26, 1950 DOA: 04/06/2013  Referring physician: Silverio [name] PCP: Deborah Lor, NP  Specialists:   Chief Complaint: DVT  HPI: [name] is a 62 y.o. female with basilar history of morbid obesity, obstructive sleep apnea and paroxysmal atrial fibrillation. Patient brought to the hospital because of acute DVT. [facility], she goes to the wound clinic for her bilateral lower extremity wounds. Patient mentioned that 2 days ago she did have more pain, she was referred to to obtain Doppler and it did show acute DVT in the right lower extremity. Patient sent to the ED for further evaluation. In the ED patient started on heparin drip, hospitalist consulted for further management.  Review of Systems:  Constitutional: negative for anorexia, fevers and sweats Eyes: negative for irritation, redness and visual disturbance Ears, nose, mouth, throat, and face: negative for earaches, epistaxis, nasal congestion and sore throat Respiratory: negative for cough, dyspnea on exertion, sputum and wheezing Cardiovascular: negative for chest pain, dyspnea, lower extremity edema, orthopnea, palpitations and syncope Gastrointestinal: negative for abdominal pain, constipation, diarrhea, melena, nausea and vomiting Genitourinary:negative for dysuria, frequency and hematuria Hematologic/lymphatic: negative for bleeding, easy bruising and lymphadenopathy Musculoskeletal:negative for arthralgias, muscle weakness and stiff joints Neurological: negative for coordination problems, gait problems, headaches and weakness Endocrine: negative for diabetic symptoms including polydipsia, polyuria and weight loss Allergic/Immunologic: negative for anaphylaxis, hay fever and urticaria  Past Medical History  Diagnosis Date  . Obesities, morbid   . Hypertension   . Cellulitis     right breast, right arm, ble  . Depression   .  Anxiety   . 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 Obstructive sleep apnea     CPAP  . Breast cancer 1988  . Dysrhythmia   . Obstructive sleep apnea on CPAP 06/04/2012  . PAF (paroxysmal atrial fibrillation) 06/04/2012   Past Surgical History  Procedure Laterality Date  . Right lumpectomy  1988  . Abdominal hysterectomy  1991  . Right carpal tunnel release  1995  . Axillary lymph node dissection  1988  . Wound debridement  09/03/2011    Procedure: DEBRIDEMENT WOUND;  Surgeon: Adolph Pollack, MD;  Location: [facility];  Service: General;  Laterality: Right;   Social History:  reports that she quit smoking about 8 years ago. Her smoking use included Cigarettes. She has a 20 pack-year smoking history. She has never used smokeless tobacco. She reports that she does not drink alcohol or use illicit drugs.  Allergies  Allergen Reactions  . Effexor [Venlafaxine Hydrochloride] Anxiety    Made her want to kill herself    Family History  Problem Relation Age of Onset  . Hypertension    . Other Mother   . Other Father    Prior to Admission medications   Medication Sig Start Date End Date Taking? Authorizing Provider  aspirin 325 MG tablet Take 1 tablet (325 mg total) by mouth daily. 03/13/13  Yes Catarina Hartshorn, MD  Cholecalciferol (VITAMIN D-3) 5000 UNITS TABS Take 1 capsule by mouth daily. 03/13/13  Yes Catarina Hartshorn, MD  citalopram (CELEXA) 10 MG tablet Take 1 tablet (10 mg total) by mouth daily. 03/13/13  Yes Catarina Hartshorn, MD  diphenhydrAMINE (BENADRYL) 25 mg capsule Take 1 capsule (25 mg total) by mouth every 6 (six) hours  as needed for allergies. 03/13/13  Yes Catarina Hartshorn, MD  gabapentin (NEURONTIN) 300 MG capsule Take 1 capsule (300 mg total) by mouth at bedtime as needed (nerve pain). 03/13/13  Yes Catarina Hartshorn, MD  HYDROcodone-acetaminophen (NORCO/VICODIN) 5-325 MG per tablet Take 1-2 tablets by mouth  every 4 (four) hours as needed for pain.   Yes Historical Provider, MD  LORazepam (ATIVAN) 0.5 MG tablet Take 0.25 mg by mouth every 12 (twelve) hours as needed for anxiety.   Yes Historical Provider, MD  nebivolol (BYSTOLIC) 10 MG tablet Take 10 mg by mouth daily.    Yes Historical Provider, MD   Physical Exam: Filed Vitals:   04/06/13 1230  BP: 127/76  Pulse: 79  Temp:   Resp: 22   General appearance: alert, cooperative and no distress  Head: Normocephalic, without obvious abnormality, atraumatic  Eyes: conjunctivae/corneas clear. PERRL, EOM's intact. Fundi benign.  Nose: Nares normal. Septum midline. Mucosa normal. No drainage or sinus tenderness.  Throat: lips, mucosa, and tongue normal; teeth and gums normal  Neck: Supple, no masses, no cervical lymphadenopathy, no JVD appreciated, no meningeal signs Resp: clear to auscultation bilaterally  Chest wall: no tenderness  Cardio: regular rate and rhythm, S1, S2 normal, no murmur, click, rub or gallop  GI: soft, non-tender; bowel sounds normal; no masses, no organomegaly  Extremities: extremities normal, atraumatic, no cyanosis or edema  Skin: Chronic skin changes and lower extremity consistent with stasis dermatitis, there is zinc oxide dressing Neurologic: Alert and oriented X 3, normal strength and tone. Normal symmetric reflexes. Normal coordination and gai     Labs on Admission:  Basic Metabolic Panel:  Recent Labs Lab 04/06/13 1258  NA 138  K 4.1  CL 104  CO2 24  GLUCOSE 95  BUN 11  CREATININE 1.04  CALCIUM 8.4   Liver Function Tests: No results found for this basename: AST, ALT, ALKPHOS, BILITOT, PROT, ALBUMIN,  in the last 168 hours No results found for this basename: LIPASE, AMYLASE,  in the last 168 hours No results found for this basename: AMMONIA,  in the last 168 hours CBC:  Recent Labs Lab 04/06/13 1258  WBC 8.4  NEUTROABS 5.0  HGB 11.0*  HCT 35.3*  MCV 97.2  PLT 204   Cardiac Enzymes: No  results found for this basename: CKTOTAL, CKMB, CKMBINDEX, TROPONINI,  in the last 168 hours  BNP (last 3 results) No results found for this basename: PROBNP,  in the last 8760 hours CBG: No results found for this basename: GLUCAP,  in the last 168 hours  Radiological Exams on Admission: Dg Tibia/fibula Left  04/06/2013   CLINICAL DATA:  Ulcer. Rule out osteomyelitis.  EXAM: LEFT TIBIA AND FIBULA - 2 VIEW  COMPARISON:  None.  FINDINGS: Extensive soft tissue calcifications. Degenerative change about the knee. No osseous destruction. No callus deposition. No soft tissue gas. Extensive soft tissue calcifications.  IMPRESSION: No plain film evidence of osteomyelitis.   Electronically Signed   By: Jeronimo Greaves M.D.   On: 04/06/2013 13:33   Dg Tibia/fibula Right  04/06/2013   CLINICAL DATA:  Right leg ulcer. Rule out osteomyelitis.  EXAM: RIGHT TIBIA AND FIBULA - 2 VIEW  COMPARISON:  None.  FINDINGS: Advanced degenerate changes about the knee. Extensive soft tissue calcification. No osseous destruction identified. No periosteal reaction or callus deposition. No soft tissue gas.  IMPRESSION: No plain film evidence of osteomyelitis.   Electronically Signed   By: Jeronimo Greaves M.D.   On:  04/06/2013 13:32    EKG: Independently reviewed.   Assessment/Plan Active Problems:   Stasis dermatitis   Lower extremity cellulitis   PAF (paroxysmal atrial fibrillation)   Acute DVT (deep venous thrombosis)   Acute DVT -Started on heparin drip, pharmacy to manage. -Likely secondary to morbid obesity, immobility and concurrent infection. -Likely can change to Xarelto in the morning. -If no symptoms can likely go back to her nursing home in a.m.  PAF -Paroxysmal atrial fibrillation, patient rate is controlled. -According to the last discharge note patient declined anticoagulation on discharge because of recent rectal bleeding. -Likely patient will need prolonged anticoagulation.  Lower extremity  cellulitis -No fever, no leukocytosis or worsening of pain or lower extremity. -Started empirically on vancomycin. -I think this is chronic changes, vancomycin can be discontinued in a.m. if patient continued to be afebrile/no WBC.  Stasis dermatitis -With chronic changes in both lower extremities suggesting chronic stasis. -Patient has extensive ulceration, follows with wound clinic as outpatient.  Code Status: Full code Family Communication: Plan discussed with the patient Disposition Plan: Inpatient.  Time spent: 70 minutes  [facility] Pager [phone]  If 7PM-7AM, please contact night-coverage [email or web address] Password [email or web address] 04/06/2013, 3:56 PM

## 2013-04-07 ENCOUNTER — Encounter (HOSPITAL_COMMUNITY): Payer: Self-pay | Admitting: General Practice

## 2013-04-07 DIAGNOSIS — N61 Mastitis without abscess: Secondary | ICD-10-CM

## 2013-04-07 LAB — CBC
HCT: 33.1 % — ABNORMAL LOW (ref 36.0–46.0)
MCHC: 31.1 g/dL (ref 30.0–36.0)
RBC: 3.41 MIL/uL — ABNORMAL LOW (ref 3.87–5.11)
RDW: 15.5 % (ref 11.5–15.5)

## 2013-04-07 LAB — BASIC METABOLIC PANEL
CO2: 23 mEq/L (ref 19–32)
Calcium: 8.2 mg/dL — ABNORMAL LOW (ref 8.4–10.5)
Creatinine, Ser: 0.95 mg/dL (ref 0.50–1.10)
GFR calc Af Amer: 72 mL/min — ABNORMAL LOW (ref >90)

## 2013-04-07 LAB — HEPARIN LEVEL (UNFRACTIONATED): Heparin Unfractionated: 0.17 IU/mL — ABNORMAL LOW (ref 0.30–0.70)

## 2013-04-07 MED ORDER — RIVAROXABAN 15 MG PO TABS
15.0000 mg | ORAL_TABLET | Freq: Two times a day (BID) | ORAL | Status: DC
  Administered 2013-04-07 – 2013-04-08 (×2): 15 mg via ORAL
  Filled 2013-04-07 (×4): qty 1

## 2013-04-07 MED ORDER — HEPARIN BOLUS VIA INFUSION
3000.0000 [IU] | Freq: Once | INTRAVENOUS | Status: AC
  Administered 2013-04-07: 3000 [IU] via INTRAVENOUS
  Filled 2013-04-07: qty 3000

## 2013-04-07 MED ORDER — PRO-STAT SUGAR FREE PO LIQD
30.0000 mL | Freq: Two times a day (BID) | ORAL | Status: DC
  Administered 2013-04-07 – 2013-04-08 (×2): 30 mL via ORAL
  Filled 2013-04-07 (×3): qty 30

## 2013-04-07 MED ORDER — ADULT MULTIVITAMIN W/MINERALS CH
1.0000 | ORAL_TABLET | Freq: Every day | ORAL | Status: DC
  Administered 2013-04-07 – 2013-04-08 (×2): 1 via ORAL
  Filled 2013-04-07 (×2): qty 1

## 2013-04-07 MED ORDER — COLLAGENASE 250 UNIT/GM EX OINT
TOPICAL_OINTMENT | CUTANEOUS | Status: DC
  Administered 2013-04-07: 15:00:00 via TOPICAL
  Filled 2013-04-07: qty 30

## 2013-04-07 NOTE — Progress Notes (Signed)
ANTICOAGULATION CONSULT NOTE - Followup  Pharmacy Consult for Rivaroxaban -  Indication: DVT   Allergies  Allergen Reactions  . Effexor [Venlafaxine Hydrochloride] Anxiety    Made her want to kill herself   Patient Measurements: Height: 5\' 2"  (157.5 cm) Weight: 344 lb 6.4 oz (156.219 kg) IBW/kg (Calculated) : 50.1 Heparin Dosing Weight:  100 kg  Vital Signs: Temp: 98.9 F (37.2 C) (10/17 1333) Temp src: Oral (10/17 1333) BP: 111/72 mmHg (10/17 1333) Pulse Rate: 88 (10/17 1333)  Labs:  Recent Labs  04/06/13 1258 04/06/13 2205 04/07/13 0620  HGB 11.0*  --  10.3*  HCT 35.3*  --  33.1*  PLT 204  --  169  LABPROT 15.0  --   --   INR 1.21  --   --   HEPARINUNFRC  --  0.15* 0.17*  CREATININE 1.04  --  0.95   Estimated Creatinine Clearance: 87.4 ml/min (by C-G formula based on Cr of 0.95).  Medical History: Past Medical History  Diagnosis Date  . Obesities, morbid   . Hypertension   . Cellulitis     right breast, right arm, ble  . Depression   . Anxiety   . 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 Breast cancer 1988  . Dysrhythmia   . PAF (paroxysmal atrial fibrillation) 06/04/2012  . Pneumonia     "several times; last time was in the 1990's" (04/07/2013)  . Chronic bronchitis     "maybe 4 years in a row; last time was ~1990" (04/07/2013)  . Obstructive sleep apnea on CPAP 06/04/2012  . Exertional shortness of breath   . Arthritis     "knees; fingers/hands" (04/07/2013)  . Stasis dermatitis   . Right leg DVT 04/06/2013    "behind my knee" (04/07/2013)   Medications:  ASA 325mg , Vit D, Celexa, Benadryl, Neurontin, Vicodin, Ativan, Nebivolol  Assessment: 62 y/o F at wound care clinic c/o leg pain. Found +DVT. She was started on IV heparin and is now being converted to oral rivaroxaban (Xarelto).  She will need education regarding the importance of Rivaroxaban  and compliance and we will provide that for her today.   Goal of Therapy:  Therapeutic response to therapy and no DVT extension Monitor platelets by anticoagulation protocol: Yes   Plan:  1.  Will start Rivaroxaban at 15 mg twice daily for 21 days and then decrease dose to 20mg  daily. 2.  Monitor for s/s of bleeding complications.  Nadara Mustard, PharmD., MS Clinical Pharmacist Pager:  [phone] Thank you for allowing pharmacy to be part of this patients care team. 04/07/2013,2:39 PM

## 2013-04-07 NOTE — Progress Notes (Signed)
INITIAL NUTRITION ASSESSMENT  DOCUMENTATION CODES Per approved criteria  -Morbid Obesity   INTERVENTION: 1- Discussed with pt the importance of a healthy diet with weight loss. Encouraged small, frequent meals, whole grains, fruits and vegetables, and choices with low sodium. 2- Discussed the importance of consuming high-protein foods for wound healing.  3- Prostat BID, each supplement provides 15 g of protein and 100 kcal. 4- Will add MVI for wound healing.  NUTRITION DIAGNOSIS: Inadequate oral intake related to loss of appetite and forgetting to eat as evidenced by weight loss of 40 lbs.  Goal: Pt to continue weight loss, but at a slower rate of 1-2 lbs per week.   Monitor:  Weight change/trends Po intake Labs  Reason for Assessment: MST  62 y.o. female  Admitting Dx: <principal problem not specified>  ASSESSMENT: [name] is a 62 y.o. female with basilar history of morbid obesity, obstructive sleep apnea and paroxysmal atrial fibrillation. Patient brought to the hospital because of acute DVT. [facility], she goes to the wound clinic for her bilateral lower extremity wounds. Patient mentioned that 2 days ago she did have more pain, she was referred to to obtain Doppler and it did show acute DVT in the right lower extremity.  Pt reports that she has had a decrease in appetite over the past couple of months. She reports that she may have lost up to 40 lbs. RD discussed with pt the importance of losing weight slowly and without skipping meals. Pt encouraged to consume small frequent meals with high-protein foods. Pt reports that she most often consumes cottage cheese with fruit. Encouraged to try low-sodium cottage cheese. Pt reports eating 100% of her meals in the hospital. She says that she often gets busy and "forgets to eat and drink at home." Pt encouraged to leave bottles of water around where she can see them along with healthy snacks such as  nuts.   Height: Ht Readings from Last 1 Encounters:  04/06/13 5\' 2"  (1.575 m)    Weight: Wt Readings from Last 1 Encounters:  04/06/13 344 lb 6.4 oz (156.219 kg)    Ideal Body Weight: 50.1 kg  % Ideal Body Weight: 312%  Wt Readings from Last 10 Encounters:  04/06/13 344 lb 6.4 oz (156.219 kg)  03/09/13 398 lb 13 oz (180.9 kg)  06/08/12 383 lb 13.1 oz (174.1 kg)  09/02/11 403 lb (182.8 kg)  09/02/11 403 lb (182.8 kg)  07/02/11 413 lb (187.336 kg)    Usual Body Weight: possibly 383 lbs, recalled by pt  % Usual Body Weight: 90%  BMI:  Body mass index is 62.98 kg/(m^2).  Estimated Nutritional Needs: Kcal: 2300- 2500 (using ABW: 92.5 kg) Protein: 160-180 g Fluid: >2.3 L  Skin: bilateral lower extremity wounds  Diet Order: Cardiac  EDUCATION NEEDS: -Education needs addressed  No intake or output data in the 24 hours ending 04/07/13 1217  Last BM: PTA   Labs:   Recent Labs Lab 04/06/13 1258 04/07/13 0620  NA 138 140  K 4.1 4.2  CL 104 108  CO2 24 23  BUN 11 11  CREATININE 1.04 0.95  CALCIUM 8.4 8.2*  GLUCOSE 95 83    CBG (last 3)  No results found for this basename: GLUCAP,  in the last 72 hours  Scheduled Meds: . aspirin  325 mg Oral Daily  . citalopram  10 mg Oral Daily  . nebivolol  10 mg Oral Daily  . sulfamethoxazole-trimethoprim  1 tablet Oral Q12H    Continuous Infusions: . heparin 2,500 Units/hr (04/07/13 0750)    Past Medical History  Diagnosis Date  . Obesities, morbid   . Hypertension   . Cellulitis     right breast, right arm, ble  . Depression   . Anxiety   . 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 Breast cancer 1988  . Dysrhythmia   . PAF (paroxysmal atrial fibrillation) 06/04/2012  . Pneumonia     "several times; last time was in the 1990's" (04/07/2013)  . Chronic bronchitis     "maybe 4 years in a row; last time was  ~1990" (04/07/2013)  . Obstructive sleep apnea on CPAP 06/04/2012  . Exertional shortness of breath   . Arthritis     "knees; fingers/hands" (04/07/2013)  . Stasis dermatitis   . Right leg DVT 04/06/2013    "behind my knee" (04/07/2013)    Past Surgical History  Procedure Laterality Date  . Abdominal hysterectomy  1991  . Carpal tunnel release Right 1995  . Axillary lymph node dissection Right 1988  . Wound debridement  09/03/2011    Procedure: DEBRIDEMENT WOUND;  Surgeon: Adolph Pollack, MD;  Location: [facility];  Service: General;  Laterality: Right;  . Breast lumpectomy Right 1988  . Breast surgery Right 10/2011    "cut out calcification" (04/07/2013)  . Cataract extraction w/ intraocular lens implant Right ~ 2006    Ebbie Latus RD, LDN

## 2013-04-07 NOTE — Progress Notes (Signed)
Orthopedic Tech Progress Note Patient Details:  [name] 07-24-1950 [id number]  Ortho Devices Type of Ortho Device: Roland Rack boot Ortho Device/Splint Location: bilateral Ortho Device/Splint Interventions: Application   Nikki Dom 04/07/2013, 3:45 PM

## 2013-04-07 NOTE — Consult Note (Signed)
WOC wound consult note Reason for Consult: Consult requested for bilat leg stasis ulcers.  Pt states she had them for a year and a half, and home health has been assisting with dressing changes.  She went to the outpatient wound care center last week at [facility], and they have ordered Santyl ointment and una boots to be changed every other day. Wound type: Multiple full thickness wounds Measurement: Difficult to measure R/T multiple locations.   Left anterior leg .2X2X.2cm, 90% red, 10% yellow Left posterior leg 4X1.5X.2cm, 50%yellow, 50% red Left posterior leg 4X1.X.2cm, 50%yellow, 50% red Left posterior leg 1X1.X.2cm, 50%yellow, 50% red Right posterior leg 3X6X.2cm 50%yellow, 50% red Right posterior leg 3X3X.2cm 50%yellow, 50% red Right posterior leg 2X1X.2cm 50%yellow, 50% red Right posterior leg 1X1X.2cm 50%yellow, 50% red  Drainage (amount, consistency, odor) All sites with mod amt yellow drainage, slight odor Periwound:area protected by zinc paste Dressing procedure/placement/frequency: Continue with present plan of care with Santyl and una boot changes 3X week.  Pt can resume follow-up with outpatient wound care center after discharge. WOC will re-assess on Mon with dressing change if pt still in the hospital at that time. Cammie Mcgee MSN, RN, CWOCN, [place], CNS [phone]

## 2013-04-07 NOTE — Progress Notes (Signed)
[facility] PROGRESS NOTE Interim History: 62 y.o. female with basilar history of morbid obesity, obstructive sleep apnea and paroxysmal atrial fibrillation. Patient brought to the hospital because of acute DVT. Patient resides in [facility] nursing home, she goes to the wound clinic for her bilateral lower extremity wounds. Patient mentioned that 2 days ago she did have more pain, she was referred to to obtain Doppler and it did show acute DVT in the right lower extremity    Assessment/Plan:  Acute DVT  - Started on heparin drip, pharmacy to manage.  - Start Xarelto - monitor Hbg. - SNF in am  PAF  - Paroxysmal atrial fibrillation, patient rate is controlled.  - According to the last discharge note patient declined anticoagulation on discharge because of recent rectal bleeding.  - monitor HBg. - Lower extremity redness: -No fever, no leukocytosis or worsening of pain or lower extremity.  - dc vanc.  Stasis dermatitis  - Patient has extensive ulceration, follows with wound clinic as outpatient.  - WOC consult   Code Status: Full code  Family Communication: Plan discussed with the patient  Disposition Plan: Inpatient.    Consultants:  none  Procedures:  Doppler lower ext 10.16.2014: lower ext DVT.  Antibiotics:  Vanc 10.16-10.17.2014  HPI/Subjective: Mild lower extremity pain  Objective: Filed Vitals:   04/06/13 1600 04/07/13 0205 04/07/13 0555 04/07/13 1030  BP: 109/62 111/65 147/71 95/47  Pulse: 71 73 76 78  Temp: 99 F (37.2 C) 98.9 F (37.2 C) 98.6 F (37 C) 98.7 F (37.1 C)  TempSrc: Oral Oral Oral Oral  Resp: 18 18 20 18   Height: 5\' 2"  (1.575 m)     Weight: 156.219 kg (344 lb 6.4 oz)     SpO2: 90% 94% 98% 97%   No intake or output data in the 24 hours ending 04/07/13 1111 Filed Weights   04/06/13 1213 04/06/13 1600  Weight: 156.491 kg (345 lb) 156.219 kg (344 lb 6.4 oz)    Exam:  General: Alert, awake, oriented x3, in no acute  distress.  HEENT: No bruits, no goiter.  Heart: Regular rate and rhythm, without murmurs, rubs, gallops.  Lungs: Good air movement,  Clear to auscultation. Abdomen: Soft, nontender, nondistended, positive bowel sounds.  Neuro: Grossly intact, nonfocal.   Data Reviewed: Basic Metabolic Panel:  Recent Labs Lab 04/06/13 1258 04/07/13 0620  NA 138 140  K 4.1 4.2  CL 104 108  CO2 24 23  GLUCOSE 95 83  BUN 11 11  CREATININE 1.04 0.95  CALCIUM 8.4 8.2*   Liver Function Tests: No results found for this basename: AST, ALT, ALKPHOS, BILITOT, PROT, ALBUMIN,  in the last 168 hours No results found for this basename: LIPASE, AMYLASE,  in the last 168 hours No results found for this basename: AMMONIA,  in the last 168 hours CBC:  Recent Labs Lab 04/06/13 1258 04/07/13 0620  WBC 8.4 7.2  NEUTROABS 5.0  --   HGB 11.0* 10.3*  HCT 35.3* 33.1*  MCV 97.2 97.1  PLT 204 169   Cardiac Enzymes: No results found for this basename: CKTOTAL, CKMB, CKMBINDEX, TROPONINI,  in the last 168 hours BNP (last 3 results) No results found for this basename: PROBNP,  in the last 8760 hours CBG: No results found for this basename: GLUCAP,  in the last 168 hours  No results found for this or any previous visit (from the past 240 hour(s)).   Studies: Dg Tibia/fibula Left  04/06/2013   CLINICAL DATA:  Ulcer. Rule out osteomyelitis.  EXAM: LEFT TIBIA AND FIBULA - 2 VIEW  COMPARISON:  None.  FINDINGS: Extensive soft tissue calcifications. Degenerative change about the knee. No osseous destruction. No callus deposition. No soft tissue gas. Extensive soft tissue calcifications.  IMPRESSION: No plain film evidence of osteomyelitis.   Electronically Signed   By: Jeronimo Greaves M.D.   On: 04/06/2013 13:33   Dg Tibia/fibula Right  04/06/2013   CLINICAL DATA:  Right leg ulcer. Rule out osteomyelitis.  EXAM: RIGHT TIBIA AND FIBULA - 2 VIEW  COMPARISON:  None.  FINDINGS: Advanced degenerate changes about the knee.  Extensive soft tissue calcification. No osseous destruction identified. No periosteal reaction or callus deposition. No soft tissue gas.  IMPRESSION: No plain film evidence of osteomyelitis.   Electronically Signed   By: Jeronimo Greaves M.D.   On: 04/06/2013 13:32    Scheduled Meds: . aspirin  325 mg Oral Daily  . citalopram  10 mg Oral Daily  . nebivolol  10 mg Oral Daily  . sulfamethoxazole-trimethoprim  1 tablet Oral Q12H   Continuous Infusions: . heparin 2,500 Units/hr (04/07/13 0750)     Marinda Elk  [facility] Pager [phone]. If 8PM-8AM, please contact night-coverage at [email or web address], password [facility] 04/07/2013, 11:11 AM  LOS: 1 day

## 2013-04-07 NOTE — Progress Notes (Addendum)
ANTICOAGULATION CONSULT NOTE - Followup  Pharmacy Consult for Heparin Indication: DVT   Allergies  Allergen Reactions  . Effexor [Venlafaxine Hydrochloride] Anxiety    Made her want to kill herself    Patient Measurements: Height: 5\' 2"  (157.5 cm) Weight: 344 lb 6.4 oz (156.219 kg) IBW/kg (Calculated) : 50.1 Heparin Dosing Weight:  100 kg  Vital Signs: Temp: 99 F (37.2 C) (10/16 1600) Temp src: Oral (10/16 1600) BP: 109/62 mmHg (10/16 1600) Pulse Rate: 71 (10/16 1600)  Labs:  Recent Labs  04/06/13 1258 04/06/13 2205  HGB 11.0*  --   HCT 35.3*  --   PLT 204  --   LABPROT 15.0  --   INR 1.21  --   HEPARINUNFRC  --  0.15*  CREATININE 1.04  --     Estimated Creatinine Clearance: 79.8 ml/min (by C-G formula based on Cr of 1.04).   Medical History: Past Medical History  Diagnosis Date  . Obesities, morbid   . Hypertension   . Cellulitis     right breast, right arm, ble  . Depression   . Anxiety   . 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 Obstructive sleep apnea     CPAP  . Breast cancer 1988  . Dysrhythmia   . Obstructive sleep apnea on CPAP 06/04/2012  . PAF (paroxysmal atrial fibrillation) 06/04/2012    Medications:  ASA 325mg , Vit D, Celexa, Benadryl, Neurontin, Vicodin, Ativan, Nebivolol  Assessment: 62 y/o F at wound care clinic c/o leg pain. Found +DVT. Plan to start heparin in morbid obesity.  Initial heparin level 0.15 units/ml  Goal of Therapy:  Heparin level 0.3-0.7 units/ml Monitor platelets by anticoagulation protocol: Yes   Plan:  Increase heparin drip to 2100 units/hr Check heparin level in ~ 6 hours    Talbert Cage, PharmD Clinical Staff Pharmacist  04/07/2013,12:04 AM  Addum:  AM HL 0.17 units/ml.  Will rebolus 3000 units and increase drip to 2500 units/hr.  F/u 6 hr HL.

## 2013-04-08 DIAGNOSIS — N61 Mastitis without abscess: Secondary | ICD-10-CM | POA: Diagnosis present

## 2013-04-08 LAB — URINE CULTURE: Colony Count: >=100000

## 2013-04-08 MED ORDER — HYDROCODONE-ACETAMINOPHEN 5-325 MG PO TABS: 1.0000 | ORAL_TABLET | ORAL | Status: DC | PRN

## 2013-04-08 MED ORDER — ASPIRIN EC 81 MG PO TBEC: 81.0000 mg | DELAYED_RELEASE_TABLET | Freq: Every day | ORAL | Status: AC

## 2013-04-08 MED ORDER — RIVAROXABAN 15 MG PO TABS: 15.0000 mg | ORAL_TABLET | Freq: Two times a day (BID) | ORAL | Status: DC

## 2013-04-08 MED ORDER — SULFAMETHOXAZOLE-TRIMETHOPRIM 400-80 MG PO TABS: 1.0000 | ORAL_TABLET | Freq: Two times a day (BID) | ORAL | Status: AC

## 2013-04-08 MED ORDER — RIVAROXABAN 20 MG PO TABS: 20.0000 mg | ORAL_TABLET | Freq: Every day | ORAL | Status: DC

## 2013-04-08 NOTE — Discharge Summary (Addendum)
Physician Discharge Summary  [name] ZOX:[id number] DOB: 05-Oct-1950 DOA: 04/06/2013  PCP: Marletta Lor, NP  Admit date: 04/06/2013 Discharge date: 04/08/2013  Time spent: 35 minutes  Recommendations for Outpatient Follow-up:  1. Follow up with PCP 2 weeks. Check BP. 2. Follow up with wound care as an outpatient. 3. Dry to wet dressing in the right breast. Follow up with wound clinic as an outpatient.  Discharge Diagnoses:  Active Problems:   Stasis dermatitis   Lower extremity cellulitis   PAF (paroxysmal atrial fibrillation)   Acute DVT (deep venous thrombosis)   Cellulitis of female breast   Discharge Condition: stable  Diet recommendation: heart healthy  Filed Weights   04/06/13 1213 04/06/13 1600  Weight: 156.491 kg (345 lb) 156.219 kg (344 lb 6.4 oz)    History of present illness:  62 y.o. female with basilar history of morbid obesity, obstructive sleep apnea and paroxysmal atrial fibrillation. Patient brought to the hospital because of acute DVT. Patient resides in [facility] nursing home, she goes to the wound clinic for her bilateral lower extremity wounds. Patient mentioned that 2 days ago she did have more pain, she was referred to to obtain Doppler and it did show acute DVT in the right lower extremity. Patient sent to the ED for further evaluation.  In the ED patient started on heparin drip, hospitalist consulted for further management.   Hospital Course:  Acute DVT  - Started on heparin drip, pharmacy to manage.  - transition to Xarelto  - Stable  Hbg.   PAF  - Paroxysmal atrial fibrillation, patient rate is controlled.  - According to the last discharge note patient declined anticoagulation on discharge because of recent rectal bleeding.  - monitor HBg.   Lower extremity redness:  -No fever, no leukocytosis or worsening of pain or lower extremity.  - dc vanc.   Stasis dermatitis  - Patient has extensive ulceration, follows with wound clinic as  outpatient.  - WOC consult: plan of care with Santyl and una boot changes 3X week. - outpatient wound care center after discharge  Right breast cellulitis: - purulent drainage no fluctuation, some induration. - will start bactrim and cont it for 3 week. Is a febrile, non toxic appearing. - will need to follow up with Wound care center.   Procedures: Lower extremity DVT  Consultations:  none  Discharge Exam: Filed Vitals:   04/08/13 0524  BP: 111/53  Pulse: 71  Temp: 97.6 F (36.4 C)  Resp: 16    General: A&O x3 Cardiovascular: RRR Respiratory: good air movement CTA B/L  Discharge Instructions      Discharge Orders   Future Orders Complete By Expires   Diet - low sodium heart healthy  As directed    Increase activity slowly  As directed        Medication List    STOP taking these medications       aspirin 325 MG tablet  Replaced by:  aspirin EC 81 MG tablet      TAKE these medications       aspirin EC 81 MG tablet  Take 1 tablet (81 mg total) by mouth daily.     citalopram 10 MG tablet  Commonly known as:  CELEXA  Take 1 tablet (10 mg total) by mouth daily.     diphenhydrAMINE 25 mg capsule  Commonly known as:  BENADRYL  Take 1 capsule (25 mg total) by mouth every 6 (six) hours as needed for allergies.  gabapentin 300 MG capsule  Commonly known as:  NEURONTIN  Take 1 capsule (300 mg total) by mouth at bedtime as needed (nerve pain).     HYDROcodone-acetaminophen 5-325 MG per tablet  Commonly known as:  NORCO/VICODIN  Take 1-2 tablets by mouth every 4 (four) hours as needed for pain.     LORazepam 0.5 MG tablet  Commonly known as:  ATIVAN  Take 0.25 mg by mouth every 12 (twelve) hours as needed for anxiety.     nebivolol 10 MG tablet  Commonly known as:  BYSTOLIC  Take 10 mg by mouth daily.     Rivaroxaban 15 MG Tabs tablet  Commonly known as:  XARELTO  Take 1 tablet (15 mg total) by mouth 2 (two) times daily with a meal.      Rivaroxaban 20 MG Tabs tablet  Commonly known as:  XARELTO  Take 1 tablet (20 mg total) by mouth daily.  Start taking on:  04/28/2013     sulfamethoxazole-trimethoprim 400-80 MG per tablet  Commonly known as:  BACTRIM  Take 1 tablet by mouth 2 (two) times daily.     Vitamin D-3 5000 UNITS Tabs  Take 1 capsule by mouth daily.       Allergies  Allergen Reactions  . Effexor [Venlafaxine Hydrochloride] Anxiety    Made her want to kill herself   Follow-up Information   Follow up with Marletta Lor, NP In 2 weeks. (hospital follow up)    Specialty:  Nurse Practitioner   Contact information:   [facility] [address] [place] [address] [phone]       Follow up with [facility]              In 1 week. (hospital follow up)    Contact information:   [address] [place] [address] [phone]       The results of significant diagnostics from this hospitalization (including imaging, microbiology, ancillary and laboratory) are listed below for reference.    Significant Diagnostic Studies: Dg Tibia/fibula Left  04/06/2013   CLINICAL DATA:  Ulcer. Rule out osteomyelitis.  EXAM: LEFT TIBIA AND FIBULA - 2 VIEW  COMPARISON:  None.  FINDINGS: Extensive soft tissue calcifications. Degenerative change about the knee. No osseous destruction. No callus deposition. No soft tissue gas. Extensive soft tissue calcifications.  IMPRESSION: No plain film evidence of osteomyelitis.   Electronically Signed   By: Jeronimo Greaves M.D.   On: 04/06/2013 13:33   Dg Tibia/fibula Right  04/06/2013   CLINICAL DATA:  Right leg ulcer. Rule out osteomyelitis.  EXAM: RIGHT TIBIA AND FIBULA - 2 VIEW  COMPARISON:  None.  FINDINGS: Advanced degenerate changes about the knee. Extensive soft tissue calcification. No osseous destruction identified. No periosteal reaction or callus deposition. No soft tissue gas.  IMPRESSION: No plain  film evidence of osteomyelitis.   Electronically Signed   By: Jeronimo Greaves M.D.   On: 04/06/2013 13:32    Microbiology: Recent Results (from the past 240 hour(s))  URINE CULTURE     Status: None   Collection Time    04/06/13  2:34 PM      Result Value Range Status   Specimen Description URINE, CATHETERIZED   Final   Special Requests NONE   Final   Culture  Setup Time     Final   Value: 04/06/2013 15:38     Performed at [facility]  Colony Count     Final   Value: >=100,000 COLONIES/ML     Performed at [facility]   Culture     Final   Value: ESCHERICHIA COLI     Performed at [facility]   Report Status 04/08/2013 FINAL   Final   Organism ID, Bacteria ESCHERICHIA COLI   Final     Labs: Basic Metabolic Panel:  Recent Labs Lab 04/06/13 1258 04/07/13 0620  NA 138 140  K 4.1 4.2  CL 104 108  CO2 24 23  GLUCOSE 95 83  BUN 11 11  CREATININE 1.04 0.95  CALCIUM 8.4 8.2*   Liver Function Tests: No results found for this basename: AST, ALT, ALKPHOS, BILITOT, PROT, ALBUMIN,  in the last 168 hours No results found for this basename: LIPASE, AMYLASE,  in the last 168 hours No results found for this basename: AMMONIA,  in the last 168 hours CBC:  Recent Labs Lab 04/06/13 1258 04/07/13 0620  WBC 8.4 7.2  NEUTROABS 5.0  --   HGB 11.0* 10.3*  HCT 35.3* 33.1*  MCV 97.2 97.1  PLT 204 169   Cardiac Enzymes: No results found for this basename: CKTOTAL, CKMB, CKMBINDEX, TROPONINI,  in the last 168 hours BNP: BNP (last 3 results) No results found for this basename: PROBNP,  in the last 8760 hours CBG: No results found for this basename: GLUCAP,  in the last 168 hours     Signed:  Marinda Elk  [facility] 04/08/2013, 10:42 AM

## 2013-04-08 NOTE — Progress Notes (Signed)
Patient is medically ready for D/C today. CSW prepared D/C packet and arranged PTAR (non-emergency EMS) to [facility]. Patient reported that she would contact her son and family and let them know she is going over there today. Please reconsult if further social work needs arise. CSW signing off.   Jetta Lout, LCSWA Weekend CSW [phone]

## 2013-04-08 NOTE — Progress Notes (Signed)
ANTICOAGULATION CONSULT NOTE - Followup  Pharmacy Consult for Rivaroxaban -  Indication: DVT   Allergies  Allergen Reactions  . Effexor [Venlafaxine Hydrochloride] Anxiety    Made her want to kill herself   Patient Measurements: Height: 5\' 2"  (157.5 cm) Weight: 344 lb 6.4 oz (156.219 kg) IBW/kg (Calculated) : 50.1 Heparin Dosing Weight:  100 kg  Vital Signs: Temp: 97.6 F (36.4 C) (10/18 0524) Temp src: Oral (10/18 0524) BP: 111/53 mmHg (10/18 0524) Pulse Rate: 71 (10/18 0524)  Labs:  Recent Labs  04/06/13 1258 04/06/13 2205 04/07/13 0620  HGB 11.0*  --  10.3*  HCT 35.3*  --  33.1*  PLT 204  --  169  LABPROT 15.0  --   --   INR 1.21  --   --   HEPARINUNFRC  --  0.15* 0.17*  CREATININE 1.04  --  0.95   Estimated Creatinine Clearance: 87.4 ml/min (by C-G formula based on Cr of 0.95).  Medical History: Past Medical History  Diagnosis Date  . Obesities, morbid   . Hypertension   . Cellulitis     right breast, right arm, ble  . Depression   . Anxiety   . 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 Breast cancer 1988  . Dysrhythmia   . PAF (paroxysmal atrial fibrillation) 06/04/2012  . Pneumonia     "several times; last time was in the 1990's" (04/07/2013)  . Chronic bronchitis     "maybe 4 years in a row; last time was ~1990" (04/07/2013)  . Obstructive sleep apnea on CPAP 06/04/2012  . Exertional shortness of breath   . Arthritis     "knees; fingers/hands" (04/07/2013)  . Stasis dermatitis   . Right leg DVT 04/06/2013    "behind my knee" (04/07/2013)   Medications:  ASA 325mg , Vit D, Celexa, Benadryl, Neurontin, Vicodin, Ativan, Nebivolol  Assessment: 62 y/o F at wound care clinic c/o leg pain. Found +DVT. She was started on IV heparin and is now on oral rivaroxaban (Xarelto).  She received her first dose last evening.  Her H/H is down some but no active  bleeding noted.  Plans for her return to SNF noted.  Education: We have discussed Rivaroxaban therapy with this patient and she was receptive and expressed understanding of it's importance.  We have included discharge instructions in the [facility] AVS system for her review.    Goal of Therapy:  Therapeutic response to therapy and no DVT extension Monitor platelets by anticoagulation protocol: Yes   Plan:  1.  Continue Rivaroxaban at 15 mg twice daily for 21 days and then decrease dose to 20mg  daily. 2.  Monitor for s/s of bleeding complications.  Nadara Mustard, PharmD., MS Clinical Pharmacist Pager:  [phone] Thank you for allowing pharmacy to be part of this patients care team. 04/08/2013,8:11 AM

## 2013-04-08 NOTE — Progress Notes (Addendum)
[facility] PROGRESS NOTE Interim History: 62 y.o. female with basilar history of morbid obesity, obstructive sleep apnea and paroxysmal atrial fibrillation. Patient brought to the hospital because of acute DVT. Patient resides in for care nursing home, she goes to the wound clinic for her bilateral lower extremity wounds. Patient mentioned that 2 days ago she did have more pain, she was referred to to obtain Doppler and it did show acute DVT in the right lower extremity    Assessment/Plan:  Acute DVT  - Started on heparin drip, pharmacy to manage.  - Start Xarelto - monitor Hbg. - SNF in am  Right breast cellulitis with open ulcer: - Afebrile, no leukocytosis. - Foul smelling, started bactrim for 3 weeks. - Follow up with wound care.  PAF  - Paroxysmal atrial fibrillation, patient rate is controlled.  - According to the last discharge note patient declined anticoagulation on discharge because of recent rectal bleeding.  - monitor HBg. - Lower extremity redness: -No fever, no leukocytosis or worsening of pain or lower extremity.  - dc vanc.  Stasis dermatitis  - Patient has extensive ulceration, follows with wound clinic as outpatient.  - WOC consult   Code Status: Full code  Family Communication: Plan discussed with the patient  Disposition Plan: Inpatient.    Consultants:  none  Procedures:  Doppler lower ext 10.16.2014: lower ext DVT.  Antibiotics:  Vanc 10.16-10.17.2014  HPI/Subjective: Mild lower extremity pain  Objective: Filed Vitals:   04/07/13 1333 04/07/13 1736 04/07/13 2136 04/08/13 0524  BP: 111/72 110/54 121/48 111/53  Pulse: 88 85 72 71  Temp: 98.9 F (37.2 C) 98.5 F (36.9 C) 99.1 F (37.3 C) 97.6 F (36.4 C)  TempSrc: Oral Oral Oral Oral  Resp: 22 18 18 16   Height:      Weight:      SpO2: 98% 95% 96% 99%    Intake/Output Summary (Last 24 hours) at 04/08/13 1049 Last data filed at 04/07/13 1900  Gross per 24 hour  Intake     480 ml  Output      0 ml  Net    480 ml   Filed Weights   04/06/13 1213 04/06/13 1600  Weight: 156.491 kg (345 lb) 156.219 kg (344 lb 6.4 oz)    Exam:  General: Alert, awake, oriented x3, in no acute distress.  HEENT: No bruits, no goiter.  Heart: Regular rate and rhythm, without murmurs, rubs, gallops.  Lungs: Good air movement,  Clear to auscultation. Abdomen: Soft, nontender, nondistended, positive bowel sounds.  Neuro: Grossly intact, nonfocal.   Data Reviewed: Basic Metabolic Panel:  Recent Labs Lab 04/06/13 1258 04/07/13 0620  NA 138 140  K 4.1 4.2  CL 104 108  CO2 24 23  GLUCOSE 95 83  BUN 11 11  CREATININE 1.04 0.95  CALCIUM 8.4 8.2*   Liver Function Tests: No results found for this basename: AST, ALT, ALKPHOS, BILITOT, PROT, ALBUMIN,  in the last 168 hours No results found for this basename: LIPASE, AMYLASE,  in the last 168 hours No results found for this basename: AMMONIA,  in the last 168 hours CBC:  Recent Labs Lab 04/06/13 1258 04/07/13 0620  WBC 8.4 7.2  NEUTROABS 5.0  --   HGB 11.0* 10.3*  HCT 35.3* 33.1*  MCV 97.2 97.1  PLT 204 169   Cardiac Enzymes: No results found for this basename: CKTOTAL, CKMB, CKMBINDEX, TROPONINI,  in the last 168 hours BNP (last 3 results) No results found for  this basename: PROBNP,  in the last 8760 hours CBG: No results found for this basename: GLUCAP,  in the last 168 hours  Recent Results (from the past 240 hour(s))  URINE CULTURE     Status: None   Collection Time    04/06/13  2:34 PM      Result Value Range Status   Specimen Description URINE, CATHETERIZED   Final   Special Requests NONE   Final   Culture  Setup Time     Final   Value: 04/06/2013 15:38     Performed at [facility] Count     Final   Value: >=100,000 COLONIES/ML     Performed at [facility]   Culture     Final   Value: ESCHERICHIA COLI     Performed at [facility]   Report Status 04/08/2013  FINAL   Final   Organism ID, Bacteria ESCHERICHIA COLI   Final     Studies: Dg Tibia/fibula Left  04/06/2013   CLINICAL DATA:  Ulcer. Rule out osteomyelitis.  EXAM: LEFT TIBIA AND FIBULA - 2 VIEW  COMPARISON:  None.  FINDINGS: Extensive soft tissue calcifications. Degenerative change about the knee. No osseous destruction. No callus deposition. No soft tissue gas. Extensive soft tissue calcifications.  IMPRESSION: No plain film evidence of osteomyelitis.   Electronically Signed   By: Jeronimo Greaves M.D.   On: 04/06/2013 13:33   Dg Tibia/fibula Right  04/06/2013   CLINICAL DATA:  Right leg ulcer. Rule out osteomyelitis.  EXAM: RIGHT TIBIA AND FIBULA - 2 VIEW  COMPARISON:  None.  FINDINGS: Advanced degenerate changes about the knee. Extensive soft tissue calcification. No osseous destruction identified. No periosteal reaction or callus deposition. No soft tissue gas.  IMPRESSION: No plain film evidence of osteomyelitis.   Electronically Signed   By: Jeronimo Greaves M.D.   On: 04/06/2013 13:32    Scheduled Meds: . aspirin  325 mg Oral Daily  . citalopram  10 mg Oral Daily  . collagenase   Topical Q M,W,F  . feeding supplement (PRO-STAT SUGAR FREE 64)  30 mL Oral BID WC  . multivitamin with minerals  1 tablet Oral Daily  . nebivolol  10 mg Oral Daily  . Rivaroxaban  15 mg Oral BID WC  . sulfamethoxazole-trimethoprim  1 tablet Oral Q12H   Continuous Infusions:     Marinda Elk  [facility] Pager [phone]. If 8PM-8AM, please contact night-coverage at [email or web address], password [facility] 04/08/2013, 10:49 AM  LOS: 2 days

## 2013-04-08 NOTE — Progress Notes (Signed)
DC'd IV. Called report to [place] at [facility]. Went over discharge instructions with patient and answered her questions. Patient does not have any further questions at this time. Patient denies any pain and does not appear to be in acute distress. Patient will be transported by ambulance.

## 2013-04-08 NOTE — Progress Notes (Signed)
Patient has old rt breast wound from approximately one and a half years ago. Stated she has been doing wet to dry dressings changes daily since that time. MD notified of right breast wound and dressing request. Verbal order received for WOC consult.

## 2013-04-25 ENCOUNTER — Encounter (HOSPITAL_BASED_OUTPATIENT_CLINIC_OR_DEPARTMENT_OTHER): Payer: Medicare Other | Attending: General Surgery

## 2013-04-25 DIAGNOSIS — I87319 Chronic venous hypertension (idiopathic) with ulcer of unspecified lower extremity: Secondary | ICD-10-CM | POA: Insufficient documentation

## 2013-04-25 DIAGNOSIS — L97909 Non-pressure chronic ulcer of unspecified part of unspecified lower leg with unspecified severity: Secondary | ICD-10-CM | POA: Insufficient documentation

## 2014-04-06 ENCOUNTER — Other Ambulatory Visit: Payer: Self-pay

## 2014-12-17 ENCOUNTER — Other Ambulatory Visit: Payer: Self-pay

## 2015-02-27 ENCOUNTER — Emergency Department (HOSPITAL_COMMUNITY): Payer: Medicare Other

## 2015-02-27 ENCOUNTER — Inpatient Hospital Stay (HOSPITAL_COMMUNITY)
Admission: EM | Admit: 2015-02-27 | Discharge: 2015-03-07 | DRG: 602 | Disposition: A | Discharge status: Home Health | Payer: Medicare Other | Attending: Internal Medicine | Admitting: Internal Medicine

## 2015-02-27 ENCOUNTER — Encounter (HOSPITAL_COMMUNITY): Payer: Self-pay | Admitting: *Deleted

## 2015-02-27 DIAGNOSIS — L259 Unspecified contact dermatitis, unspecified cause: Secondary | ICD-10-CM | POA: Diagnosis present

## 2015-02-27 DIAGNOSIS — L8990 Pressure ulcer of unspecified site, unspecified stage: Secondary | ICD-10-CM

## 2015-02-27 DIAGNOSIS — L03113 Cellulitis of right upper limb: Secondary | ICD-10-CM | POA: Diagnosis not present

## 2015-02-27 DIAGNOSIS — Z23 Encounter for immunization: Secondary | ICD-10-CM | POA: Diagnosis not present

## 2015-02-27 DIAGNOSIS — D72829 Elevated white blood cell count, unspecified: Secondary | ICD-10-CM

## 2015-02-27 DIAGNOSIS — J189 Pneumonia, unspecified organism: Secondary | ICD-10-CM | POA: Diagnosis not present

## 2015-02-27 DIAGNOSIS — Z888 Allergy status to other drugs, medicaments and biological substances status: Secondary | ICD-10-CM

## 2015-02-27 DIAGNOSIS — L03119 Cellulitis of unspecified part of limb: Secondary | ICD-10-CM

## 2015-02-27 DIAGNOSIS — Z853 Personal history of malignant neoplasm of breast: Secondary | ICD-10-CM | POA: Diagnosis not present

## 2015-02-27 DIAGNOSIS — A419 Sepsis, unspecified organism: Secondary | ICD-10-CM

## 2015-02-27 DIAGNOSIS — I10 Essential (primary) hypertension: Secondary | ICD-10-CM | POA: Diagnosis present

## 2015-02-27 DIAGNOSIS — F419 Anxiety disorder, unspecified: Secondary | ICD-10-CM | POA: Diagnosis present

## 2015-02-27 DIAGNOSIS — Z7401 Bed confinement status: Secondary | ICD-10-CM

## 2015-02-27 DIAGNOSIS — G4733 Obstructive sleep apnea (adult) (pediatric): Secondary | ICD-10-CM | POA: Diagnosis present

## 2015-02-27 DIAGNOSIS — I480 Paroxysmal atrial fibrillation: Secondary | ICD-10-CM | POA: Diagnosis not present

## 2015-02-27 DIAGNOSIS — L03313 Cellulitis of chest wall: Secondary | ICD-10-CM | POA: Diagnosis not present

## 2015-02-27 DIAGNOSIS — Z90710 Acquired absence of both cervix and uterus: Secondary | ICD-10-CM | POA: Diagnosis not present

## 2015-02-27 DIAGNOSIS — R532 Functional quadriplegia: Secondary | ICD-10-CM | POA: Diagnosis present

## 2015-02-27 DIAGNOSIS — Z7982 Long term (current) use of aspirin: Secondary | ICD-10-CM

## 2015-02-27 DIAGNOSIS — K529 Noninfective gastroenteritis and colitis, unspecified: Secondary | ICD-10-CM | POA: Diagnosis present

## 2015-02-27 DIAGNOSIS — Z79899 Other long term (current) drug therapy: Secondary | ICD-10-CM

## 2015-02-27 DIAGNOSIS — L03114 Cellulitis of left upper limb: Secondary | ICD-10-CM | POA: Diagnosis present

## 2015-02-27 DIAGNOSIS — Z86718 Personal history of other venous thrombosis and embolism: Secondary | ICD-10-CM | POA: Diagnosis not present

## 2015-02-27 DIAGNOSIS — N61 Inflammatory disorders of breast: Secondary | ICD-10-CM | POA: Diagnosis not present

## 2015-02-27 DIAGNOSIS — F32A Depression, unspecified: Secondary | ICD-10-CM | POA: Diagnosis present

## 2015-02-27 DIAGNOSIS — J42 Unspecified chronic bronchitis: Secondary | ICD-10-CM | POA: Diagnosis present

## 2015-02-27 DIAGNOSIS — Z8249 Family history of ischemic heart disease and other diseases of the circulatory system: Secondary | ICD-10-CM

## 2015-02-27 DIAGNOSIS — Z87891 Personal history of nicotine dependence: Secondary | ICD-10-CM

## 2015-02-27 DIAGNOSIS — F329 Major depressive disorder, single episode, unspecified: Secondary | ICD-10-CM | POA: Diagnosis not present

## 2015-02-27 DIAGNOSIS — I872 Venous insufficiency (chronic) (peripheral): Secondary | ICD-10-CM | POA: Diagnosis present

## 2015-02-27 DIAGNOSIS — E875 Hyperkalemia: Secondary | ICD-10-CM | POA: Diagnosis not present

## 2015-02-27 DIAGNOSIS — J181 Lobar pneumonia, unspecified organism: Secondary | ICD-10-CM | POA: Diagnosis not present

## 2015-02-27 DIAGNOSIS — A047 Enterocolitis due to Clostridium difficile: Secondary | ICD-10-CM | POA: Diagnosis not present

## 2015-02-27 DIAGNOSIS — I878 Other specified disorders of veins: Secondary | ICD-10-CM | POA: Diagnosis not present

## 2015-02-27 DIAGNOSIS — I890 Lymphedema, not elsewhere classified: Secondary | ICD-10-CM

## 2015-02-27 DIAGNOSIS — M1990 Unspecified osteoarthritis, unspecified site: Secondary | ICD-10-CM | POA: Diagnosis not present

## 2015-02-27 DIAGNOSIS — L0390 Cellulitis, unspecified: Secondary | ICD-10-CM | POA: Diagnosis present

## 2015-02-27 DIAGNOSIS — B369 Superficial mycosis, unspecified: Secondary | ICD-10-CM | POA: Diagnosis present

## 2015-02-27 DIAGNOSIS — R32 Unspecified urinary incontinence: Secondary | ICD-10-CM | POA: Diagnosis not present

## 2015-02-27 DIAGNOSIS — N179 Acute kidney failure, unspecified: Secondary | ICD-10-CM

## 2015-02-27 DIAGNOSIS — Z6841 Body Mass Index (BMI) 40.0 and over, adult: Secondary | ICD-10-CM | POA: Diagnosis not present

## 2015-02-27 DIAGNOSIS — I272 Other secondary pulmonary hypertension: Secondary | ICD-10-CM | POA: Diagnosis not present

## 2015-02-27 DIAGNOSIS — R509 Fever, unspecified: Secondary | ICD-10-CM | POA: Diagnosis not present

## 2015-02-27 LAB — URINE MICROSCOPIC-ADD ON

## 2015-02-27 LAB — URINALYSIS, ROUTINE W REFLEX MICROSCOPIC
Glucose, UA: NEGATIVE mg/dL
KETONES UR: NEGATIVE mg/dL
NITRITE: NEGATIVE
PH: 6.5 (ref 5.0–8.0)
PROTEIN: NEGATIVE mg/dL
Specific Gravity, Urine: 1.029 (ref 1.005–1.030)
Urobilinogen, UA: 1 mg/dL (ref 0.0–1.0)

## 2015-02-27 LAB — COMPREHENSIVE METABOLIC PANEL
ALT: 9 U/L — ABNORMAL LOW (ref 14–54)
ANION GAP: 9 (ref 5–15)
AST: 19 U/L (ref 15–41)
Albumin: 2.9 g/dL — ABNORMAL LOW (ref 3.5–5.0)
Alkaline Phosphatase: 71 U/L (ref 38–126)
BILIRUBIN TOTAL: 1.2 mg/dL (ref 0.3–1.2)
BUN: 15 mg/dL (ref 6–20)
CHLORIDE: 101 mmol/L (ref 101–111)
CO2: 24 mmol/L (ref 22–32)
Calcium: 8.7 mg/dL — ABNORMAL LOW (ref 8.9–10.3)
Creatinine, Ser: 1.17 mg/dL — ABNORMAL HIGH (ref 0.44–1.00)
GFR, EST AFRICAN AMERICAN: 55 mL/min — AB (ref >60)
GFR, EST NON AFRICAN AMERICAN: 47 mL/min — AB (ref >60)
Glucose, Bld: 116 mg/dL — ABNORMAL HIGH (ref 65–99)
POTASSIUM: 4.3 mmol/L (ref 3.5–5.1)
Sodium: 134 mmol/L — ABNORMAL LOW (ref 135–145)
TOTAL PROTEIN: 7.8 g/dL (ref 6.5–8.1)

## 2015-02-27 LAB — CBC WITH DIFFERENTIAL/PLATELET
BASOS ABS: 0 10*3/uL (ref 0.0–0.1)
Basophils Relative: 0 % (ref 0–1)
EOS PCT: 0 % (ref 0–5)
Eosinophils Absolute: 0 10*3/uL (ref 0.0–0.7)
HCT: 38.2 % (ref 36.0–46.0)
HEMOGLOBIN: 12.3 g/dL (ref 12.0–15.0)
LYMPHS PCT: 10 % — AB (ref 12–46)
Lymphs Abs: 1.2 10*3/uL (ref 0.7–4.0)
MCH: 31.6 pg (ref 26.0–34.0)
MCHC: 32.2 g/dL (ref 30.0–36.0)
MCV: 98.2 fL (ref 78.0–100.0)
Monocytes Absolute: 0.3 10*3/uL (ref 0.1–1.0)
Monocytes Relative: 3 % (ref 3–12)
NEUTROS ABS: 10.7 10*3/uL — AB (ref 1.7–7.7)
NEUTROS PCT: 87 % — AB (ref 43–77)
PLATELETS: 186 10*3/uL (ref 150–400)
RBC: 3.89 MIL/uL (ref 3.87–5.11)
RDW: 15.4 % (ref 11.5–15.5)
WBC: 12.2 10*3/uL — AB (ref 4.0–10.5)

## 2015-02-27 LAB — I-STAT CG4 LACTIC ACID, ED
LACTIC ACID, VENOUS: 1.21 mmol/L (ref 0.5–2.0)
Lactic Acid, Venous: 2.17 mmol/L (ref 0.5–2.0)

## 2015-02-27 MED ORDER — ONDANSETRON HCL 4 MG/2ML IJ SOLN: 4.0000 mg | Freq: Four times a day (QID) | INTRAMUSCULAR | Status: DC | PRN

## 2015-02-27 MED ORDER — HYDROMORPHONE HCL 1 MG/ML IJ SOLN: 1.0000 mg | INTRAMUSCULAR | Status: DC | PRN

## 2015-02-27 MED ORDER — ASPIRIN EC 81 MG PO TBEC
81.0000 mg | DELAYED_RELEASE_TABLET | Freq: Every day | ORAL | Status: DC
  Administered 2015-02-28 – 2015-03-07 (×8): 81 mg via ORAL
  Filled 2015-02-27 (×8): qty 1

## 2015-02-27 MED ORDER — DEXTROSE 5 % IV SOLN
500.0000 mg | Freq: Once | INTRAVENOUS | Status: AC
  Administered 2015-02-27: 500 mg via INTRAVENOUS
  Filled 2015-02-27: qty 500

## 2015-02-27 MED ORDER — ONDANSETRON HCL 4 MG/2ML IJ SOLN: 4.0000 mg | Freq: Three times a day (TID) | INTRAMUSCULAR | Status: DC | PRN

## 2015-02-27 MED ORDER — ACETAMINOPHEN 500 MG PO TABS
1000.0000 mg | ORAL_TABLET | Freq: Once | ORAL | Status: AC
  Administered 2015-02-27: 1000 mg via ORAL
  Filled 2015-02-27: qty 2

## 2015-02-27 MED ORDER — VANCOMYCIN HCL 10 G IV SOLR
2500.0000 mg | Freq: Once | INTRAVENOUS | Status: DC
  Filled 2015-02-27: qty 2500

## 2015-02-27 MED ORDER — SODIUM CHLORIDE 0.9 % IV BOLUS (SEPSIS)
1000.0000 mL | Freq: Once | INTRAVENOUS | Status: AC
  Administered 2015-02-27: 1000 mL via INTRAVENOUS

## 2015-02-27 MED ORDER — OXYCODONE HCL 5 MG PO TABS
5.0000 mg | ORAL_TABLET | ORAL | Status: DC | PRN
  Filled 2015-02-27: qty 1

## 2015-02-27 MED ORDER — ENOXAPARIN SODIUM 40 MG/0.4ML ~~LOC~~ SOLN: 40.0000 mg | SUBCUTANEOUS | Status: DC

## 2015-02-27 MED ORDER — ENOXAPARIN SODIUM 100 MG/ML ~~LOC~~ SOLN
90.0000 mg | Freq: Every day | SUBCUTANEOUS | Status: DC
  Administered 2015-02-27 – 2015-03-06 (×8): 90 mg via SUBCUTANEOUS
  Filled 2015-02-27 (×9): qty 1

## 2015-02-27 MED ORDER — DEXTROSE 5 % IV SOLN
1.0000 g | INTRAVENOUS | Status: DC
  Administered 2015-02-28 – 2015-03-04 (×5): 1 g via INTRAVENOUS
  Filled 2015-02-27 (×5): qty 10

## 2015-02-27 MED ORDER — LORAZEPAM 1 MG PO TABS
1.0000 mg | ORAL_TABLET | Freq: Three times a day (TID) | ORAL | Status: DC | PRN
  Filled 2015-02-27: qty 1

## 2015-02-27 MED ORDER — DEXTROSE 5 % IV SOLN
500.0000 mg | INTRAVENOUS | Status: DC
  Administered 2015-02-28: 500 mg via INTRAVENOUS
  Filled 2015-02-27 (×2): qty 500

## 2015-02-27 MED ORDER — GABAPENTIN 300 MG PO CAPS
300.0000 mg | ORAL_CAPSULE | Freq: Every evening | ORAL | Status: DC | PRN
  Filled 2015-02-27: qty 1

## 2015-02-27 MED ORDER — SODIUM CHLORIDE 0.9 % IV SOLN
INTRAVENOUS | Status: DC
  Administered 2015-02-28 – 2015-03-04 (×3): via INTRAVENOUS

## 2015-02-27 MED ORDER — NEBIVOLOL HCL 10 MG PO TABS
10.0000 mg | ORAL_TABLET | Freq: Every day | ORAL | Status: DC
  Administered 2015-02-28 – 2015-03-07 (×8): 10 mg via ORAL
  Filled 2015-02-27 (×8): qty 1

## 2015-02-27 MED ORDER — VANCOMYCIN HCL IN DEXTROSE 1-5 GM/200ML-% IV SOLN: 1000.0000 mg | Freq: Once | INTRAVENOUS | Status: DC

## 2015-02-27 MED ORDER — ONDANSETRON HCL 4 MG PO TABS: 4.0000 mg | ORAL_TABLET | Freq: Four times a day (QID) | ORAL | Status: DC | PRN

## 2015-02-27 MED ORDER — VANCOMYCIN HCL IN DEXTROSE 1-5 GM/200ML-% IV SOLN
1000.0000 mg | Freq: Once | INTRAVENOUS | Status: AC
  Administered 2015-02-27: 1000 mg via INTRAVENOUS
  Filled 2015-02-27: qty 200

## 2015-02-27 MED ORDER — CEFTRIAXONE SODIUM 1 G IJ SOLR
1.0000 g | Freq: Once | INTRAMUSCULAR | Status: AC
  Administered 2015-02-27: 1 g via INTRAVENOUS
  Filled 2015-02-27: qty 10

## 2015-02-27 MED ORDER — PANTOPRAZOLE SODIUM 40 MG PO TBEC
40.0000 mg | DELAYED_RELEASE_TABLET | Freq: Every day | ORAL | Status: DC
  Administered 2015-02-28 – 2015-03-07 (×7): 40 mg via ORAL
  Filled 2015-02-27 (×9): qty 1

## 2015-02-27 MED ORDER — DIPHENHYDRAMINE HCL 25 MG PO CAPS: 25.0000 mg | ORAL_CAPSULE | Freq: Four times a day (QID) | ORAL | Status: DC | PRN

## 2015-02-27 MED ORDER — INFLUENZA VAC SPLIT QUAD 0.5 ML IM SUSY
0.5000 mL | PREFILLED_SYRINGE | INTRAMUSCULAR | Status: AC
  Administered 2015-02-28: 0.5 mL via INTRAMUSCULAR
  Filled 2015-02-27 (×2): qty 0.5

## 2015-02-27 MED ORDER — ALBUTEROL SULFATE (2.5 MG/3ML) 0.083% IN NEBU: 2.5000 mg | INHALATION_SOLUTION | Freq: Four times a day (QID) | RESPIRATORY_TRACT | Status: DC | PRN

## 2015-02-27 MED ORDER — IPRATROPIUM BROMIDE 0.02 % IN SOLN: 0.5000 mg | Freq: Four times a day (QID) | RESPIRATORY_TRACT | Status: DC | PRN

## 2015-02-27 MED ORDER — CLOTRIMAZOLE 1 % EX CREA
TOPICAL_CREAM | Freq: Two times a day (BID) | CUTANEOUS | Status: DC
  Administered 2015-02-28 (×2): via TOPICAL
  Filled 2015-02-27: qty 15

## 2015-02-27 MED ORDER — VANCOMYCIN HCL 10 G IV SOLR
1500.0000 mg | Freq: Once | INTRAVENOUS | Status: AC
  Administered 2015-02-27: 1500 mg via INTRAVENOUS
  Filled 2015-02-27: qty 1500

## 2015-02-27 MED ORDER — SODIUM CHLORIDE 0.9 % IV SOLN
INTRAVENOUS | Status: AC
  Administered 2015-02-27: 23:00:00 via INTRAVENOUS

## 2015-02-27 NOTE — ED Notes (Addendum)
Patients is from home. Contacted by EMS d/T [facility] NP assessment of right arm appeared to be infected. Pt is morbidly obese . BP 110 palpated,HR tachy 120. Tylenol 1000 mg PO given. Tem panic temp 102

## 2015-02-27 NOTE — Progress Notes (Signed)
ANTIBIOTIC CONSULT NOTE - INITIAL  Pharmacy Consult for Vancomycin Indication: Cellulitis of right arm  Allergies  Allergen Reactions  . Effexor [Venlafaxine Hydrochloride] Anxiety    Made her want to kill herself    Patient Measurements: Height: 5\' 4"  (162.6 cm) Weight: (!) 400 lb (181.439 kg) IBW/kg (Calculated) : 54.7   Vital Signs: Temp: 101.5 F (38.6 C) (09/07 1458) Temp Source: Oral (09/07 1458) BP: 93/54 mmHg (09/07 1757) Pulse Rate: 91 (09/07 1757) Intake/Output from previous day:   Intake/Output from this shift: Total I/O In: 2350 [I.V.:2350] Out: -   Labs:  Recent Labs  02/27/15 1608  WBC 12.2*  HGB 12.3  PLT 186  CREATININE 1.17*   Estimated Creatinine Clearance: 78.7 mL/min (by C-G formula based on Cr of 1.17). No results for input(s): VANCOTROUGH, VANCOPEAK, VANCORANDOM, GENTTROUGH, GENTPEAK, GENTRANDOM, TOBRATROUGH, TOBRAPEAK, TOBRARND, AMIKACINPEAK, AMIKACINTROU, AMIKACIN in the last 72 hours.   Microbiology: No results found for this or any previous visit (from the past 720 hour(s)).  Medical History: Past Medical History  Diagnosis Date  . Obesities, morbid   . Hypertension   . Cellulitis     right breast, right arm, ble  . Depression   . Anxiety   . 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 Breast cancer 1988  . Dysrhythmia   . PAF (paroxysmal atrial fibrillation) 06/04/2012  . Pneumonia     "several times; last time was in the 1990's" (04/07/2013)  . Chronic bronchitis     "maybe 4 years in a row; last time was ~1990" (04/07/2013)  . Obstructive sleep apnea on CPAP 06/04/2012  . Exertional shortness of breath   . Arthritis     "knees; fingers/hands" (04/07/2013)  . Stasis dermatitis   . Right leg DVT 04/06/2013    "behind my knee" (04/07/2013)    Medications:  Prescriptions prior to admission  Medication Sig Dispense Refill  Last Dose  . aspirin EC 81 MG tablet Take 1 tablet (81 mg total) by mouth daily.   02/26/2015 at Unknown time  . Cholecalciferol (VITAMIN D-3) 5000 UNITS TABS Take 1 capsule by mouth daily. 30 tablet 0 02/26/2015 at Unknown time  . diphenhydrAMINE (BENADRYL) 25 mg capsule Take 1 capsule (25 mg total) by mouth every 6 (six) hours as needed for allergies. 30 capsule 0 02/26/2015 at Unknown time  . gabapentin (NEURONTIN) 300 MG capsule Take 1 capsule (300 mg total) by mouth at bedtime as needed (nerve pain). (Patient taking differently: Take 300 mg by mouth at bedtime. ) 30 capsule 0 02/26/2015 at Unknown time  . loperamide (IMODIUM A-D) 2 MG tablet Take 2 mg by mouth 4 (four) times daily as needed for diarrhea or loose stools.   02/26/2015 at Unknown time  . LORazepam (ATIVAN) 0.5 MG tablet Take 0.25 mg by mouth every 12 (twelve) hours as needed for anxiety.   02/26/2015 at Unknown time  . nebivolol (BYSTOLIC) 10 MG tablet Take 10 mg by mouth daily.    02/26/2015 at Unknown time  . sertraline (ZOLOFT) 50 MG tablet TK 1 T PO  QHS.  4 Past Week at Unknown time  . traZODone (DESYREL) 100 MG tablet TK 1 T PO  HS.  3 02/26/2015 at Unknown time   Scheduled:  . sodium chloride   Intravenous STAT  . [START ON 02/28/2015] aspirin EC  81 mg  Oral Daily  . [START ON 02/28/2015] azithromycin  500 mg Intravenous Q24H  . [START ON 02/28/2015] cefTRIAXone (ROCEPHIN)  IV  1 g Intravenous Q24H  . enoxaparin (LOVENOX) injection  90 mg Subcutaneous QHS  . [START ON 02/28/2015] Influenza vac split quadrivalent PF  0.5 mL Intramuscular Tomorrow-1000  . [START ON 02/28/2015] nebivolol  10 mg Oral Daily  . [START ON 02/28/2015] pantoprazole  40 mg Oral Daily  . vancomycin  2,500 mg Intravenous Once   Infusions:  . sodium chloride     Assessment: 64 yoF c/o arm swelling and fever.  Pt with morbid obesity and hx of lymphedema of bilateral arms and legs.  IV Vancomycin per Rx for cellulitis of right arm.   Goal of Therapy:  Vancomycin trough  level 10-15 mcg/ml  Plan:   Vancomycin 2500mg  x1 then 1750mg  IV q24h  F/u SCr/cultures/levels as needed  Dorrene German 02/27/2015,10:42 PM

## 2015-02-27 NOTE — ED Notes (Signed)
Unable to get secondary IV access/2nd set blood cx. EDP made aware. EDP request IV abx to be started

## 2015-02-27 NOTE — H&P (Signed)
[facility] History and Physical  [name] HWT:[id number] DOB: 09-11-1950 DOA: 02/27/2015  Referring physician: Forde Dandy, MD PCP: Alvester Chou, NP   Chief Complaint: Fever and increased swelling.  HPI: [name] is a 64 y.o. female with a past medical history of hypertension, depression, anxiety, lymphedema, breast cancer status post lumpectomy and mastectomy, morbid obesity, obstructive sleep apnea, DVT, recurrent cellulitis of extremities who comes to the emergency department with a history of progressively worse erythema, drainage and tenderness of her left antecubital area for several days. Patient reports having fever at home and was also febrile initially in the emergency department. She also reports having cough, nasal congestion, mild dyspnea for several days.   Workup in the ER reveals leukocytosis and an mildly increased lactic acid level. She is currently in no acute distress and stated that her pain is better after she was medicated with analgesics.  Review of Systems:  Constitutional:  No weight loss, night sweats, Fevers, chills, fatigue.  HEENT:  No headaches, Difficulty swallowing,Tooth/dental problems,Sore throat,  No sneezing, itching, ear ache, nasal congestion, post nasal drip,  Cardio-vascular:  No chest pain, Orthopnea, PND, swelling in lower extremities, anasarca, dizziness, palpitations  GI:  No heartburn, indigestion, abdominal pain, nausea, vomiting, diarrhea, change in bowel habits, loss of appetite  Resp:  No shortness of breath with exertion or at rest. No excess mucus, no productive cough, No non-productive cough, No coughing up of blood.No change in color of mucus.No wheezing.No chest wall deformity  Skin:  no rash or lesions.  GU:  no dysuria, change in color of urine, no urgency or frequency. No flank pain.  Musculoskeletal:  No joint pain or swelling. No decreased range of motion. No back pain.  Psych:  No change in mood or  affect. No depression or anxiety. No memory loss.   Past Medical History  Diagnosis Date  . Obesities, morbid   . Hypertension   . Cellulitis     right breast, right arm, ble  . Depression   . Anxiety   . 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 Breast cancer 1988  . Dysrhythmia   . PAF (paroxysmal atrial fibrillation) 06/04/2012  . Pneumonia     "several times; last time was in the 1990's" (04/07/2013)  . Chronic bronchitis     "maybe 4 years in a row; last time was ~1990" (04/07/2013)  . Obstructive sleep apnea on CPAP 06/04/2012  . Exertional shortness of breath   . Arthritis     "knees; fingers/hands" (04/07/2013)  . Stasis dermatitis   . Right leg DVT 04/06/2013    "behind my knee" (04/07/2013)   Past Surgical History  Procedure Laterality Date  . Abdominal hysterectomy  1991  . Carpal tunnel release Right 1995  . Axillary lymph node dissection Right 1988  . Wound debridement  09/03/2011    Procedure: DEBRIDEMENT WOUND;  Surgeon: Odis Hollingshead, MD;  Location: [facility] ORS;  Service: General;  Laterality: Right;  . Breast lumpectomy Right 1988  . Breast surgery Right 10/2011    "cut out calcification" (04/07/2013)  . Cataract extraction w/ intraocular lens implant Right ~ 2006   Social History:  reports that she quit smoking about 10 years ago. Her smoking use included Cigarettes. She has a 20 pack-year smoking history. She has never used smokeless tobacco. She reports that  she does not drink alcohol or use illicit drugs.  Allergies  Allergen Reactions  . Effexor [Venlafaxine Hydrochloride] Anxiety    Made her want to kill herself    Family History  Problem Relation Age of Onset  . Hypertension    . Other Mother   . Other Father     Prior to Admission medications   Medication Sig Start Date End Date Taking? Authorizing Provider  aspirin EC 81 MG tablet Take 1  tablet (81 mg total) by mouth daily. 04/08/13  Yes Charlynne Cousins, MD  Cholecalciferol (VITAMIN D-3) 5000 UNITS TABS Take 1 capsule by mouth daily. 03/13/13  Yes Orson Eva, MD  diphenhydrAMINE (BENADRYL) 25 mg capsule Take 1 capsule (25 mg total) by mouth every 6 (six) hours as needed for allergies. 03/13/13  Yes Orson Eva, MD  gabapentin (NEURONTIN) 300 MG capsule Take 1 capsule (300 mg total) by mouth at bedtime as needed (nerve pain). Patient taking differently: Take 300 mg by mouth at bedtime.  03/13/13  Yes Orson Eva, MD  loperamide (IMODIUM A-D) 2 MG tablet Take 2 mg by mouth 4 (four) times daily as needed for diarrhea or loose stools.   Yes Historical Provider, MD  LORazepam (ATIVAN) 0.5 MG tablet Take 0.25 mg by mouth every 12 (twelve) hours as needed for anxiety.   Yes Historical Provider, MD  nebivolol (BYSTOLIC) 10 MG tablet Take 10 mg by mouth daily.    Yes Historical Provider, MD  sertraline (ZOLOFT) 50 MG tablet TK 1 T PO  QHS. 01/21/15  Yes Historical Provider, MD  traZODone (DESYREL) 100 MG tablet TK 1 T PO  HS. 02/09/15  Yes Historical Provider, MD   Physical Exam: Filed Vitals:   02/27/15 1452 02/27/15 1453 02/27/15 1458 02/27/15 1757  BP:   114/75 93/54  Pulse:   98 91  Temp: 101.5 F (38.6 C)  101.5 F (38.6 C)   TempSrc: Oral  Oral   Resp:   22 20  Height:  5\' 6"  (1.676 m) 5\' 4"  (1.626 m)   Weight:  181.439 kg (400 lb) 181.439 kg (400 lb)   SpO2:   99% 93%    Wt Readings from Last 3 Encounters:  02/27/15 181.439 kg (400 lb)  04/06/13 156.219 kg (344 lb 6.4 oz)  03/09/13 180.9 kg (398 lb 13 oz)    General:  Appears calm and comfortable Eyes: PERRL, normal lids, irises & conjunctiva ENT: grossly normal hearing, lips & tongue are mildly dry. Neck: no LAD, masses or thyromegaly Cardiovascular: RRR, no m/r/g. Mild pitting LE edema, lymphedema on all extremities. Telemetry: SR, no arrhythmias  Respiratory: Right lower lobe Rales, no wheezing, no rhonchi. Decreased  respiratory effort. Abdomen: soft, ntnd Skin: Antecubital area on the left shows significant erythema, foul smelling seropurulent discharge, tenderness. Right antecubital area, inguinal areas and neck area shows erythema and foul smelling odor. Pretibial areas shows hyperpigmented hyperkeratosis on both lower extremities. Psychiatric: grossly normal mood and affect, speech fluent and appropriate Neurologic: grossly non-focal.          Labs on Admission:  Basic Metabolic Panel:  Recent Labs Lab 02/27/15 1608  NA 134*  K 4.3  CL 101  CO2 24  GLUCOSE 116*  BUN 15  CREATININE 1.17*  CALCIUM 8.7*   Liver Function Tests:  Recent Labs Lab 02/27/15 1608  AST 19  ALT 9*  ALKPHOS 71  BILITOT 1.2  PROT 7.8  ALBUMIN 2.9*   No results for input(s): LIPASE, AMYLASE  in the last 168 hours. No results for input(s): AMMONIA in the last 168 hours. CBC:  Recent Labs Lab 02/27/15 1608  WBC 12.2*  NEUTROABS 10.7*  HGB 12.3  HCT 38.2  MCV 98.2  PLT 186    Radiological Exams on Admission: Dg Chest 2 View  02/27/2015   CLINICAL DATA:  Persistent cough for several months  EXAM: CHEST - 2 VIEW  COMPARISON:  03/08/2013  FINDINGS: Cardiac shadow is within normal limits. Increased density is noted along the medial aspect of the right lung base. A portion of this is related to the patient's body habitus although an early infiltrate cannot be excluded. The lateral images are of little utility due to the patient's body habitus.  IMPRESSION: Changes suggestive of early infiltrate in the medial right lung base.   Electronically Signed   By: Inez Catalina M.D.   On: 02/27/2015 17:03    ECOCARDIOGRAM: 03/09/2013 ------------------------------------------------------------ LV EF: 50% -  55%  ------------------------------------------------------------ Indications:   Atrial fibrillation - 427.31.  ------------------------------------------------------------ History:  Risk factors: Former  tobacco use. Hypertension. Morbidly obese.  ------------------------------------------------------------ Study Conclusions  - Left ventricle: The cavity size was normal. Wall thickness was increased in a pattern of mild LVH. Systolic function was normal. The estimated ejection fraction was in the range of 50% to 55%. Wall motion was normal; there were no regional wall motion abnormalities. Doppler parameters are consistent with abnormal left ventricular relaxation (grade 1 diastolic dysfunction). Doppler parameters are consistent with elevated mean left atrial filling pressure. - Mitral valve: Moderately calcified annulus most prominent posteriorly. - Right ventricle: Systolic pressure was increased. - Atrial septum: No defect or patent foramen ovale was identified. - Pulmonary arteries: PA peak pressure: 49mm Hg (S). - Pericardium, extracardiac: A trivial pericardial effusion was identified posterior to the heart. Impressions:  - The right ventricular systolic pressure was increased consistent with mild pulmonary hypertension. Transthoracic echocardiography. M-mode, complete 2D, spectral Doppler, and color Doppler. Height: Height: 160cm. Height: 63in. Weight: Weight: 180.9kg. Weight: 398lb. Body mass index: BMI: 70.6kg/m^2. Body surface area:  BSA: 2.3m^2. Blood pressure:   115/60. Patient status: Inpatient. Location: Bedside.    Assessment/Plan Principal Problem: 1) Cellulitis of left forearm    Lower extremity cellulitis   Stasis dermatitis/Lymphedema    Admit for IV antibiotic therapy. Check wound culture and sensitivity. Follow-up blood cultures and sensitivity. Will treat as associated fungal infection with topical clotrimazole.  Active Problems:   CAP (community acquired pneumonia) Supplemental oxygen. Continue IV antibiotics. Check sputum culture and sensitivity. Bronchodilators as needed.    BMI 70 and over,  adult   HTN (hypertension) Continue current antihypertensive therapy and monitor blood pressure.        PAF (paroxysmal atrial fibrillation) Rate is controlled. Telemetry monitoring.     Code Status: Full code. DVT Prophylaxis: Lovenox SQ. Family Communication: Disposition Plan: Admit for IV antibiotic treatment for several days.  Time spent: Over 70 minutes.  Reubin Milan [facility] Pager [phone].

## 2015-02-27 NOTE — ED Notes (Signed)
Patient transported to X-ray 

## 2015-02-27 NOTE — ED Notes (Signed)
Pt is aware of need for urine specimen. 

## 2015-02-27 NOTE — ED Notes (Signed)
Oxygen saturation 88%on ra, 2 lt applied o2 sat 92-93%

## 2015-02-27 NOTE — ED Provider Notes (Addendum)
CSN: [id number]     Arrival date & time 02/27/15  1436 History   First MD Initiated Contact with Patient 02/27/15 1454     Chief Complaint  Patient presents with  . Arm Swelling  . Fever     (Consider location/radiation/quality/duration/timing/severity/associated sxs/prior Treatment) HPI 64 year old female who presents with arm redness/swelling and fever. She has history of morbid obesity, HTN, lymphedema involving bilateral legs and arms, recurrent cellulitis. Says this is consistent with history of cellulitis that she has had in her right arm and legs in the past. Reports several week history of right arm swelling, but redness and pain in arm started about 2 weeks ago. Also noted redness, pus drainage, pain within the antecubital region of her left arm over past few days. Has had subjective fever and chills. Has also had cold like symptoms of cough and congestion. Denies chest pain or SOB. Denies abdominal pain, but had an episode of nausea/vomiting over the weekend now resolved. Denies urinary symptoms.   Past Medical History  Diagnosis Date  . Obesities, morbid   . Hypertension   . Cellulitis     right breast, right arm, ble  . Depression   . Anxiety   . 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 Breast cancer 1988  . Dysrhythmia   . PAF (paroxysmal atrial fibrillation) 06/04/2012  . Pneumonia     "several times; last time was in the 1990's" (04/07/2013)  . Chronic bronchitis     "maybe 4 years in a row; last time was ~1990" (04/07/2013)  . Obstructive sleep apnea on CPAP 06/04/2012  . Exertional shortness of breath   . Arthritis     "knees; fingers/hands" (04/07/2013)  . Stasis dermatitis   . Right leg DVT 04/06/2013    "behind my knee" (04/07/2013)   Past Surgical History  Procedure Laterality Date  . Abdominal hysterectomy  1991  . Carpal tunnel release Right 1995  .  Axillary lymph node dissection Right 1988  . Wound debridement  09/03/2011    Procedure: DEBRIDEMENT WOUND;  Surgeon: Odis Hollingshead, MD;  Location: [facility];  Service: General;  Laterality: Right;  . Breast lumpectomy Right 1988  . Breast surgery Right 10/2011    "cut out calcification" (04/07/2013)  . Cataract extraction w/ intraocular lens implant Right ~ 2006   Family History  Problem Relation Age of Onset  . Hypertension    . Other Mother   . Other Father    Social History  Substance Use Topics  . Smoking status: Former Smoker -- 0.50 packs/day for 40 years    Types: Cigarettes    Quit date: 07/01/2004  . Smokeless tobacco: Never Used  . Alcohol Use: No   OB History    No data available     Review of Systems 10/14 systems reviewed and are negative other than those stated in the HPI  Allergies  Effexor  Home Medications   Prior to Admission medications   Medication Sig Start Date End Date Taking? Authorizing Provider  aspirin EC 81 MG tablet Take 1 tablet (81 mg total) by mouth daily. 04/08/13  Yes Charlynne Cousins, MD  Cholecalciferol (VITAMIN D-3) 5000 UNITS TABS Take 1 capsule by mouth daily. 03/13/13  Yes Orson Eva, MD  citalopram (CELEXA) 10 MG tablet Take 1 tablet (10 mg total) by mouth daily.  Patient not taking: Reported on 02/27/2015 03/13/13   Orson Eva, MD  diphenhydrAMINE (BENADRYL) 25 mg capsule Take 1 capsule (25 mg total) by mouth every 6 (six) hours as needed for allergies. 03/13/13   Orson Eva, MD  gabapentin (NEURONTIN) 300 MG capsule Take 1 capsule (300 mg total) by mouth at bedtime as needed (nerve pain). 03/13/13   Orson Eva, MD  HYDROcodone-acetaminophen (NORCO/VICODIN) 5-325 MG per tablet Take 1-2 tablets by mouth every 4 (four) hours as needed for pain. 04/08/13   Charlynne Cousins, MD  LORazepam (ATIVAN) 0.5 MG tablet Take 0.25 mg by mouth every 12 (twelve) hours as needed for anxiety.    Historical Provider, MD  nebivolol (BYSTOLIC) 10 MG tablet  Take 10 mg by mouth daily.     Historical Provider, MD  Rivaroxaban (XARELTO) 15 MG TABS tablet Take 1 tablet (15 mg total) by mouth 2 (two) times daily with a meal. 04/08/13   Charlynne Cousins, MD  Rivaroxaban (XARELTO) 20 MG TABS tablet Take 1 tablet (20 mg total) by mouth daily. 04/28/13   Charlynne Cousins, MD   BP 93/54 mmHg  Pulse 91  Temp(Src) 101.5 F (38.6 C) (Oral)  Resp 20  Ht 5\' 4"  (1.626 m)  Wt 400 lb (181.439 kg)  BMI 68.63 kg/m2  SpO2 93% Physical Exam  Physical Exam  Nursing note and vitals reviewed. Constitutional: Well developed, well nourished, non-toxic, and in no acute distress Head: Normocephalic and atraumatic.  Mouth/Throat: Oropharynx is clear and moist.  Neck: Normal range of motion. Neck supple.  Cardiovascular: Tachycardic rate and regular rhythm.  lymphedema involving the arms and legs Pulmonary/Chest: Effort normal and breath sounds normal. No conversational dyspnea. Exam limited by body habitus Abdominal: Soft. Morbidly obeseThere is no tenderness. There is no rebound and no guarding.  Musculoskeletal: Normal range of motion. No deformities. Neurological: Alert, no facial droop, fluent speech, moves all extremities symmetrically Skin: Skin is warm and dry. Area of induration, erythematous, and tenderness of left antecubital arm. Erythema, swelling, tenderness of right forearm.  Psychiatric: Cooperative   ED Course  Procedures (including critical care time) Labs Review Labs Reviewed  COMPREHENSIVE METABOLIC PANEL - Abnormal; Notable for the following:    Sodium 134 (*)    Glucose, Bld 116 (*)    Creatinine, Ser 1.17 (*)    Calcium 8.7 (*)    Albumin 2.9 (*)    ALT 9 (*)    GFR calc non Af Amer 47 (*)    GFR calc Af Amer 55 (*)    All other components within normal limits  CBC WITH DIFFERENTIAL/PLATELET - Abnormal; Notable for the following:    WBC 12.2 (*)    Neutrophils Relative % 87 (*)    Neutro Abs 10.7 (*)    Lymphocytes Relative  10 (*)    All other components within normal limits  I-STAT CG4 LACTIC ACID, ED - Abnormal; Notable for the following:    Lactic Acid, Venous 2.17 (*)    All other components within normal limits  CULTURE, BLOOD (ROUTINE X 2)  CULTURE, BLOOD (ROUTINE X 2)  URINE CULTURE  URINALYSIS, ROUTINE W REFLEX MICROSCOPIC (NOT AT [facility])  I-STAT CG4 LACTIC ACID, ED    Imaging Review Dg Chest 2 View  02/27/2015   CLINICAL DATA:  Persistent cough for several months  EXAM: CHEST - 2 VIEW  COMPARISON:  03/08/2013  FINDINGS: Cardiac shadow is within normal limits. Increased density is noted along the medial aspect of  the right lung base. A portion of this is related to the patient's body habitus although an early infiltrate cannot be excluded. The lateral images are of little utility due to the patient's body habitus.  IMPRESSION: Changes suggestive of early infiltrate in the medial right lung base.   Electronically Signed   By: Inez Catalina M.D.   On: 02/27/2015 17:03   I have personally reviewed and evaluated these images and lab results as part of my medical decision-making.   MDM   Final diagnoses:  Community acquired pneumonia  Cellulitis of upper extremity, unspecified laterality  AKI (acute kidney injury)  Leukocytosis  Sepsis, due to unspecified organism    In short, this is 64 year old female with history of morbid obesity and chronic lymphedema who presents with fever, and concern for cellulitis involving her arms. She is febrile, near tachycardic, but normotensive and in no acute distress on arrival. Evidence of cellulitis overlying the forearm of the left upper extremity. This area is erythematous, warm to touch, and tender to palpation. No evidence of underlying abscess. There is also area of concerning cellulitis within the left antecubital region of her upper extremity as well without concern for underlying abscess. Chest x-ray also concerning for possible pneumonia, and given that she does  have symptoms of this she is treated for community-acquired pneumonia as well. She received 2 L of IV fluids with vancomycin, ceftriaxone, and azithromycin. Blood cultures are obtained, we'll also obtain urinalysis for sepsis workup. She has a leukocytosis, with a mildly elevated lactate of 2.1, and evidence of a KI. Discussed with Dr. Olevia Bowens from hospitalist service and admitted for sepsis secondary to community-acquired pneumonia and cellulitis.   CRITICAL CARE Performed by: Forde Dandy   Total critical care time: 35 minutes  Critical care time was exclusive of separately billable procedures and treating other patients.  Critical care was necessary to treat or prevent imminent or life-threatening deterioration.  Critical care was time spent personally by me on the following activities: management of sepsis, development of treatment plan with patient and/or surrogate as well as nursing, discussions with consultants, evaluation of patient's response to treatment, examination of patient, obtaining history from patient or surrogate, ordering and performing treatments and interventions, ordering and review of laboratory studies, ordering and review of radiographic studies, pulse oximetry and re-evaluation of patient's condition.    Forde Dandy, MD 02/27/15 [place] Deandra Goering, MD 02/27/15 2004

## 2015-02-27 NOTE — Progress Notes (Signed)
EDCM spoke to patient at bedside.  Patient reports she lives at home alone.  Patient reports her next door neighbor comes to her house about 10am and stays until 9pm every day with the patient.  Patient confirms she has a Chartered certified accountant and an aide with [employer].  Patient reports her neighbor assists patient with her ADL's and [employer] aide comes to her house three times a week to assist with ADL's.  Patient reports she orders her food from [employer] and her neighbor picks it up for her.  Patient noted to be wearing oxygen in the ED, patient does NOT wear oxygen at home.  Patient reports she has a walker, wheelchair, elevated toilet seat and a hospital bed at home.  EDCM assessed for further dme needs at home, patient could not think of anything at the moment.  Patient confirms her pcp is Dr. Alvester Chou and visits patient in her home.  Patient is thankful for services.  No further EDCM needs at this time.

## 2015-02-27 NOTE — ED Notes (Signed)
Bed: [facility] Expected date:  Expected time:  Means of arrival:  Comments: EMS- 64yo F, arm infection

## 2015-02-28 DIAGNOSIS — Z6841 Body Mass Index (BMI) 40.0 and over, adult: Secondary | ICD-10-CM

## 2015-02-28 DIAGNOSIS — I890 Lymphedema, not elsewhere classified: Secondary | ICD-10-CM

## 2015-02-28 DIAGNOSIS — J189 Pneumonia, unspecified organism: Secondary | ICD-10-CM

## 2015-02-28 DIAGNOSIS — I8312 Varicose veins of left lower extremity with inflammation: Secondary | ICD-10-CM

## 2015-02-28 DIAGNOSIS — L03313 Cellulitis of chest wall: Secondary | ICD-10-CM

## 2015-02-28 DIAGNOSIS — L8990 Pressure ulcer of unspecified site, unspecified stage: Secondary | ICD-10-CM

## 2015-02-28 DIAGNOSIS — I480 Paroxysmal atrial fibrillation: Secondary | ICD-10-CM

## 2015-02-28 DIAGNOSIS — I8311 Varicose veins of right lower extremity with inflammation: Secondary | ICD-10-CM

## 2015-02-28 DIAGNOSIS — L03114 Cellulitis of left upper limb: Secondary | ICD-10-CM | POA: Diagnosis not present

## 2015-02-28 DIAGNOSIS — I10 Essential (primary) hypertension: Secondary | ICD-10-CM

## 2015-02-28 DIAGNOSIS — L03119 Cellulitis of unspecified part of limb: Secondary | ICD-10-CM

## 2015-02-28 LAB — HIV ANTIBODY (ROUTINE TESTING W REFLEX): HIV SCREEN 4TH GENERATION: NONREACTIVE

## 2015-02-28 LAB — STREP PNEUMONIAE URINARY ANTIGEN: STREP PNEUMO URINARY ANTIGEN: NEGATIVE

## 2015-02-28 MED ORDER — TRAZODONE HCL 100 MG PO TABS
100.0000 mg | ORAL_TABLET | Freq: Every day | ORAL | Status: DC
  Administered 2015-02-28 – 2015-03-06 (×7): 100 mg via ORAL
  Filled 2015-02-28 (×3): qty 1
  Filled 2015-02-28: qty 2
  Filled 2015-02-28 (×2): qty 1
  Filled 2015-02-28: qty 2
  Filled 2015-02-28 (×3): qty 1

## 2015-02-28 MED ORDER — SERTRALINE HCL 50 MG PO TABS
50.0000 mg | ORAL_TABLET | Freq: Every day | ORAL | Status: DC
  Administered 2015-02-28 – 2015-03-07 (×8): 50 mg via ORAL
  Filled 2015-02-28 (×8): qty 1

## 2015-02-28 MED ORDER — VANCOMYCIN HCL 10 G IV SOLR
1750.0000 mg | INTRAVENOUS | Status: DC
  Administered 2015-02-28 – 2015-03-04 (×5): 1750 mg via INTRAVENOUS
  Filled 2015-02-28 (×5): qty 1750

## 2015-02-28 MED ORDER — FLUCONAZOLE IN SODIUM CHLORIDE 200-0.9 MG/100ML-% IV SOLN
200.0000 mg | INTRAVENOUS | Status: DC
  Administered 2015-02-28 – 2015-03-04 (×5): 200 mg via INTRAVENOUS
  Filled 2015-02-28 (×6): qty 100

## 2015-02-28 NOTE — Care Management Note (Signed)
Case Management Note  Patient Details  Name: [name] MRN: [id number] Date of Birth: January 30, 1951  Subjective/Objective:             Cellulitis with purlent drainage fever and elevated wbc       Action/Plan:Date:  Sept.8, 2016 U.R. performed for needs and level of care. Will continue to follow for Case Management needs.  Velva Harman, RN, BSN, [place]   [phone]  Expected Discharge Date:   (unknown)               Expected Discharge Plan:  Home/Self Care  In-House Referral:  NA  Discharge planning Services  CM Consult  Post Acute Care Choice:  NA Choice offered to:  NA  DME Arranged:    DME Agency:     HH Arranged:    [place] Agency:     Status of Service:  Completed, signed off  Medicare Important Message Given:    Date Medicare IM Given:    Medicare IM give by:    Date Additional Medicare IM Given:    Additional Medicare Important Message give by:     If discussed at [place] of Stay Meetings, dates discussed:    Additional Comments:  Leeroy Cha, RN 02/28/2015, 11:37 AM

## 2015-02-28 NOTE — Progress Notes (Signed)
Rx Brief note:  Lovenox Wt=181 kg, CrCl~78 ml/min BMI=68 Rx adjusted Lovenox to 90mg  (~0.5 mg/kg) daily in pt with BMI>30  Thanks Dorrene German 02/28/2015 5:37 AM

## 2015-02-28 NOTE — Consult Note (Addendum)
WOC wound consult note Reason for Consult:Intertriginous dermatitis, incontinence associated dermatitis, full thickness tissue loss at the posterior left LE and the right anterior breast (chronic) Wound type: Moisture, neoplasm, venous insufficiency Pressure Ulcer POA: No Measurement:Right breast, full thickness:  3cm x 8cm x 3.8VF  With slick, red, moist, non-granulating wound bed.  Left posterior LE:  2cm x 2.5cm x 0.2cm full thickness VLU with red, moist non-granulating wound bed.  Left breast inframammary skin fold, bilateral skin folds on arms (UEs), sub-pannicular skin fold, neck and inguinal skin folds and crease at gluteal cleft present with intertriginous dermatitis:  Red, macerated, serous drainage consistent with fungal overgrowth and excess moisture Wound bed:As described above Drainage (amount, consistency, odor) Musty odor in skin folds, macerated periwound in neck, arms, left breast and sub-pannicular Periwound:As noted above Dressing procedure/placement/frequency: Patient is on a therapeutic mattress replacement with low air loss feature to accommodate her bariatric proportion and to address microclimate. Topical wound care is ordered for posterior left LE and right breast chronic wound.  An antimicrobial textile is provided for the injtertriginous dermatitis in place of a topical antifungal cream which in a skin fold creates more moisture issues and occasionally contact dermatitis. Bilateral pressure redistribution boots are provided to prevent pressure injuries to the heel. [place] nursing team will not follow, but will remain available to this patient, the nursing and medical teams.  Please re-consult if needed. Thanks, Maudie Flakes, MSN, RN, [place], [place], [place] [phone])

## 2015-02-28 NOTE — Progress Notes (Signed)
Progress Note   [name] DPO:[id number] DOB: 11/17/1950 DOA: 02/27/2015 PCP: Alvester Chou, NP   Brief Narrative:   [name] is an 64 y.o. female with a PMH of hypertension, depression/anxiety, breast cancer status post right lumpectomy, PAF, prior DVT, morbid obesity, OSA, and recurrent cellulitis of the extremities who was admitted with fever and left antecubital swelling consistent with recurrent cellulitis.  Assessment/Plan:   Principal Problems:   Cellulitis of left forearm and right breast with open wound of right breast - Admitted and placed on topical clotrimazole and IV vancomycin. - Add IV diflucan. - Follow-up blood cultures.    Community-acquired pneumonia - Early infiltrates noted on chest radiography in the medial right lung base. - Placed on empiric Rocephin/azithromycin. - Continue supplemental oxygen as needed. - Follow-up blood and sputum cultures. - Continue broncho-dilators as needed.  Active Problems:   BMI 70 and over, adult / Bedbound - Dietitian consulted for diet education. - Bedbound for the past 3 years.    HTN (hypertension) - Continue Bystolic.    Stasis dermatitis and lymphedema - Treating infection.    Lower extremity cellulitis and venous stasis / open wound posterior LLE - Wound care RN to evaluate, ? Unna boots.    PAF (paroxysmal atrial fibrillation) - Rate controlled on Bystolic. Continue aspirin.    Pressure ulcer - Wound care consultation.    DVT Prophylaxis - Lovenox ordered.  Family Communication: No family at the bedside. Attempted to call son, Larkin Ina, but neither number correct. Disposition Plan: Lives alone, neighbor cares for her in her home. Code Status:     Code Status Orders        Start     Ordered   02/27/15 2227  Full code   Continuous     02/27/15 2226        IV Access:    Peripheral IV   Procedures and diagnostic studies:   Dg Chest 2 View  02/27/2015   CLINICAL DATA:  Persistent  cough for several months  EXAM: CHEST - 2 VIEW  COMPARISON:  03/08/2013  FINDINGS: Cardiac shadow is within normal limits. Increased density is noted along the medial aspect of the right lung base. A portion of this is related to the patient's body habitus although an early infiltrate cannot be excluded. The lateral images are of little utility due to the patient's body habitus.  IMPRESSION: Changes suggestive of early infiltrate in the medial right lung base.   Electronically Signed   By: Inez Catalina M.D.   On: 02/27/2015 17:03     Medical Consultants:    None.  Anti-Infectives:   Vancomycin 02/27/15---> Rocephin 02/27/15---> Azithromycin 02/27/15---> Clotrimazole 02/27/15--->  Subjective:   [name] reports the recent breakdown of right lumpectomy scar.  Has some shortness of breath, no further cough (but had coughing spells prior to admission).  Feels more energetic.  Has pain in bilateral arms, especially the right antecubital fossa.  Objective:    Filed Vitals:   02/27/15 1458 02/27/15 1757 02/27/15 2305 02/28/15 0509  BP: 114/75 93/54 86/45  92/41  Pulse: 98 91 60 66  Temp: 101.5 F (38.6 C)  98 F (36.7 C) 97.5 F (36.4 C)  TempSrc: Oral  Oral Oral  Resp: 22 20 20 20   Height: 5\' 4"  (1.626 m)     Weight: 181.439 kg (400 lb)     SpO2: 99% 93% 99% 99%    Intake/Output Summary (Last 24 hours) at 02/28/15 4315 Last data  filed at 02/28/15 0510  Gross per 24 hour  Intake 3337.5 ml  Output    250 ml  Net 3087.5 ml   Filed Weights   02/27/15 1453 02/27/15 1458  Weight: 181.439 kg (400 lb) 181.439 kg (400 lb)    Exam: Gen:  NAD, morbidly obese Cardiovascular:  RRR, No M/R/G Respiratory:  Lungs CTAB Gastrointestinal:  Abdomen soft, NT/ND, + BS Skin/Extremities:  Documented as pictured.  RUE erythematous/swollen.          Data Reviewed:    Labs: Basic Metabolic Panel:  Recent Labs Lab 02/27/15 1608  NA 134*  K 4.3  CL 101  CO2 24  GLUCOSE 116*    BUN 15  CREATININE 1.17*  CALCIUM 8.7*   GFR Estimated Creatinine Clearance: 78.7 mL/min (by C-G formula based on Cr of 1.17). Liver Function Tests:  Recent Labs Lab 02/27/15 1608  AST 19  ALT 9*  ALKPHOS 71  BILITOT 1.2  PROT 7.8  ALBUMIN 2.9*   CBC:  Recent Labs Lab 02/27/15 1608  WBC 12.2*  NEUTROABS 10.7*  HGB 12.3  HCT 38.2  MCV 98.2  PLT 186   Sepsis Labs:  Recent Labs Lab 02/27/15 1608 02/27/15 1616 02/27/15 2032  WBC 12.2*  --   --   LATICACIDVEN  --  2.17* 1.21   Microbiology No results found for this or any previous visit (from the past 240 hour(s)).   Medications:   . sodium chloride   Intravenous STAT  . aspirin EC  81 mg Oral Daily  . azithromycin  500 mg Intravenous Q24H  . cefTRIAXone (ROCEPHIN)  IV  1 g Intravenous Q24H  . clotrimazole   Topical BID  . enoxaparin (LOVENOX) injection  90 mg Subcutaneous QHS  . Influenza vac split quadrivalent PF  0.5 mL Intramuscular Tomorrow-1000  . nebivolol  10 mg Oral Daily  . pantoprazole  40 mg Oral Daily  . vancomycin  1,750 mg Intravenous Q24H   Continuous Infusions: . sodium chloride Stopped (02/27/15 2230)    Time spent: 35 minutes with > 50% of time discussing current diagnostic test results, clinical impression and plan of care.   LOS: 1 day   RAMA,CHRISTINA  [facility] Pager [phone]. If unable to reach me by pager, please call my cell phone at [phone].  *Please refer to [email or web address], password [email or web address] to get updated schedule on who will round on this patient, as hospitalists switch teams weekly. If 7PM-7AM, please contact night-coverage at [email or web address], password [email or web address] for any overnight needs.  02/28/2015, 8:11 AM

## 2015-03-01 DIAGNOSIS — R532 Functional quadriplegia: Secondary | ICD-10-CM | POA: Diagnosis present

## 2015-03-01 LAB — LEGIONELLA ANTIGEN, URINE

## 2015-03-01 LAB — URINE CULTURE: Culture: NO GROWTH

## 2015-03-01 MED ORDER — AZITHROMYCIN 500 MG PO TABS
500.0000 mg | ORAL_TABLET | Freq: Every day | ORAL | Status: DC
  Administered 2015-03-01 – 2015-03-04 (×4): 500 mg via ORAL
  Filled 2015-03-01 (×5): qty 1

## 2015-03-01 NOTE — Progress Notes (Signed)
Progress Note   [name] PRF:[id number] DOB: 18-Oct-1950 DOA: 02/27/2015 PCP: Alvester Chou, NP   Brief Narrative:   [name] is an 64 y.o. female with a PMH of hypertension, depression/anxiety, breast cancer status post right lumpectomy, PAF, prior DVT, morbid obesity, OSA, and recurrent cellulitis of the extremities who was admitted with fever and left antecubital swelling consistent with recurrent cellulitis.  Assessment/Plan:   Principal Problems:   Cellulitis of left forearm and right breast with open wound of right breast - Continue topical clotrimazole, IV vancomycin and Diflucan. - Follow-up blood cultures.    Community-acquired pneumonia - Early infiltrates noted on chest radiography in the medial right lung base. - Placed on empiric Rocephin/azithromycin. - Continue supplemental oxygen as needed. - Follow-up blood and sputum cultures. - Continue broncho-dilators as needed.  Active Problems:   BMI 70 and over, adult / Bedbound with functional quadriplegia - Dietitian consulted for diet education. - Bedbound for the past 3 years. PT evaluation requested.    HTN (hypertension) - Continue Bystolic.    Stasis dermatitis and lymphedema - Treating infection.    Lower extremity cellulitis and venous stasis / open wound posterior LLE - Wound seen and evaluated by wound care RN 02/28/15. Continue wound care per recommendations.    PAF (paroxysmal atrial fibrillation) - Rate controlled on Bystolic. Continue aspirin.    Pressure ulcer - Wound care per wound care RN recommendations.    DVT Prophylaxis - Lovenox ordered.  Family Communication: No family at the bedside. Attempted to call son, Larkin Ina, but neither number correct. Disposition Plan: Lives alone, neighbor cares for her in her home. Code Status:     Code Status Orders        Start     Ordered   02/27/15 2227  Full code   Continuous     02/27/15 2226        IV Access:    Peripheral  IV   Procedures and diagnostic studies:   Dg Chest 2 View  02/27/2015   CLINICAL DATA:  Persistent cough for several months  EXAM: CHEST - 2 VIEW  COMPARISON:  03/08/2013  FINDINGS: Cardiac shadow is within normal limits. Increased density is noted along the medial aspect of the right lung base. A portion of this is related to the patient's body habitus although an early infiltrate cannot be excluded. The lateral images are of little utility due to the patient's body habitus.  IMPRESSION: Changes suggestive of early infiltrate in the medial right lung base.   Electronically Signed   By: Inez Catalina M.D.   On: 02/27/2015 17:03     Medical Consultants:    None.  Anti-Infectives:   Vancomycin 02/27/15---> Rocephin 02/27/15---> Azithromycin 02/27/15---> Clotrimazole 02/27/15--->  Subjective:   [name] has not moved her bowels in the last few days. No nausea or vomiting. Appetite fair. Reports some improvement in her right upper extremity burning type pain.  Objective:    Filed Vitals:   02/28/15 0509 02/28/15 1400 02/28/15 2156 03/01/15 0539  BP: 92/41 118/59 107/90 81/38  Pulse: 66 69 68 82  Temp: 97.5 F (36.4 C) 98.2 F (36.8 C) 98.1 F (36.7 C) 97.9 F (36.6 C)  TempSrc: Oral Oral Oral Oral  Resp: 20 20 20 20   Height:      Weight:      SpO2: 99% 90% 100% 100%    Intake/Output Summary (Last 24 hours) at 03/01/15 0745 Last data filed at 03/01/15 0600  Gross per 24 hour  Intake   3800 ml  Output    875 ml  Net   2925 ml   Filed Weights   02/27/15 1453 02/27/15 1458  Weight: 181.439 kg (400 lb) 181.439 kg (400 lb)    Exam: Gen:  NAD, morbidly obese Cardiovascular:  RRR, No M/R/G Respiratory:  Lungs CTAB Gastrointestinal:  Abdomen soft, NT/ND, + BS Skin/Extremities:  RUE still swollen and erythematous but less intensely so, other wounds documented by photo in note 02/28/15.  Data Reviewed:    Labs: Basic Metabolic Panel:  Recent Labs Lab 02/27/15 1608   NA 134*  K 4.3  CL 101  CO2 24  GLUCOSE 116*  BUN 15  CREATININE 1.17*  CALCIUM 8.7*   GFR Estimated Creatinine Clearance: 78.7 mL/min (by C-G formula based on Cr of 1.17). Liver Function Tests:  Recent Labs Lab 02/27/15 1608  AST 19  ALT 9*  ALKPHOS 71  BILITOT 1.2  PROT 7.8  ALBUMIN 2.9*   CBC:  Recent Labs Lab 02/27/15 1608  WBC 12.2*  NEUTROABS 10.7*  HGB 12.3  HCT 38.2  MCV 98.2  PLT 186   Sepsis Labs:  Recent Labs Lab 02/27/15 1608 02/27/15 1616 02/27/15 2032  WBC 12.2*  --   --   LATICACIDVEN  --  2.17* 1.21   Microbiology Recent Results (from the past 240 hour(s))  Culture, blood (routine x 2)     Status: None (Preliminary result)   Collection Time: 02/27/15  4:05 PM  Result Value Ref Range Status   Specimen Description BLOOD BLOOD LEFT HAND  Final   Special Requests BOTTLES DRAWN AEROBIC AND ANAEROBIC 5CC  Final   Culture   Final    NO GROWTH < 24 HOURS Performed at [facility]    Report Status PENDING  Incomplete  Culture, blood (routine x 2)     Status: None (Preliminary result)   Collection Time: 02/27/15  6:50 PM  Result Value Ref Range Status   Specimen Description BLOOD RIGHT HAND  Final   Special Requests IN PEDIATRIC BOTTLE 3CC  Final   Culture   Final    NO GROWTH < 24 HOURS Performed at [facility]    Report Status PENDING  Incomplete     Medications:   . aspirin EC  81 mg Oral Daily  . azithromycin  500 mg Intravenous Q24H  . cefTRIAXone (ROCEPHIN)  IV  1 g Intravenous Q24H  . enoxaparin (LOVENOX) injection  90 mg Subcutaneous QHS  . fluconazole (DIFLUCAN) IV  200 mg Intravenous Q24H  . nebivolol  10 mg Oral Daily  . pantoprazole  40 mg Oral Daily  . sertraline  50 mg Oral Daily  . traZODone  100 mg Oral QHS  . vancomycin  1,750 mg Intravenous Q24H   Continuous Infusions: . sodium chloride 75 mL/hr at 02/28/15 1310    Time spent: 25 minutes.   LOS: 2 days   Galina Haddox  [facility] Pager [phone]. If unable to reach me by pager, please call my cell phone at [phone].  *Please refer to [email or web address], password [email or web address] to get updated schedule on who will round on this patient, as hospitalists switch teams weekly. If 7PM-7AM, please contact night-coverage at [email or web address], password [email or web address] for any overnight needs.  03/01/2015, 7:45 AM

## 2015-03-01 NOTE — Progress Notes (Addendum)
ANTIBIOTIC CONSULT NOTE  Pharmacy Consult for Vancomycin Indication: Cellulitis of right arm  Allergies  Allergen Reactions  . Effexor [Venlafaxine Hydrochloride] Anxiety    Made her want to kill herself    Patient Measurements: Height: 5\' 4"  (162.6 cm) Weight: (!) 400 lb (181.439 kg) IBW/kg (Calculated) : 54.7   Vital Signs: Temp: 98.2 F (36.8 C) (09/09 0856) Temp Source: Oral (09/09 0856) BP: 93/45 mmHg (09/09 0856) Pulse Rate: 76 (09/09 0856) Intake/Output from previous day: 09/08 0701 - 09/09 0700 In: 3800 [P.O.:600; I.V.:2700; IV Piggyback:500] Out: 875 [Urine:875] Intake/Output from this shift: Total I/O In: 240 [P.O.:240] Out: 100 [Urine:100]  Labs:  Recent Labs  02/27/15 1608  WBC 12.2*  HGB 12.3  PLT 186  CREATININE 1.17*   Estimated Creatinine Clearance: 78.7 mL/min (by C-G formula based on Cr of 1.17). No results for input(s): VANCOTROUGH, VANCOPEAK, VANCORANDOM, GENTTROUGH, GENTPEAK, GENTRANDOM, TOBRATROUGH, TOBRAPEAK, TOBRARND, AMIKACINPEAK, AMIKACINTROU, AMIKACIN in the last 72 hours.   Microbiology: Recent Results (from the past 720 hour(s))  Culture, blood (routine x 2)     Status: None (Preliminary result)   Collection Time: 02/27/15  4:05 PM  Result Value Ref Range Status   Specimen Description BLOOD BLOOD LEFT HAND  Final   Special Requests BOTTLES DRAWN AEROBIC AND ANAEROBIC 5CC  Final   Culture   Final    NO GROWTH < 24 HOURS Performed at [facility]    Report Status PENDING  Incomplete  Culture, blood (routine x 2)     Status: None (Preliminary result)   Collection Time: 02/27/15  6:50 PM  Result Value Ref Range Status   Specimen Description BLOOD RIGHT HAND  Final   Special Requests IN PEDIATRIC BOTTLE 3CC  Final   Culture   Final    NO GROWTH < 24 HOURS Performed at [facility]    Report Status PENDING  Incomplete    Medical History: Past Medical History  Diagnosis Date  . Obesities, morbid   .  Hypertension   . Cellulitis     right breast, right arm, ble  . Depression   . Anxiety   . 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 Breast cancer 1988  . Dysrhythmia   . PAF (paroxysmal atrial fibrillation) 06/04/2012  . Pneumonia     "several times; last time was in the 1990's" (04/07/2013)  . Chronic bronchitis     "maybe 4 years in a row; last time was ~1990" (04/07/2013)  . Obstructive sleep apnea on CPAP 06/04/2012  . Exertional shortness of breath   . Arthritis     "knees; fingers/hands" (04/07/2013)  . Stasis dermatitis   . Right leg DVT 04/06/2013    "behind my knee" (04/07/2013)    Medications:  Prescriptions prior to admission  Medication Sig Dispense Refill Last Dose  . aspirin EC 81 MG tablet Take 1 tablet (81 mg total) by mouth daily.   02/26/2015 at Unknown time  . Cholecalciferol (VITAMIN D-3) 5000 UNITS TABS Take 1 capsule by mouth daily. 30 tablet 0 02/26/2015 at Unknown time  . diphenhydrAMINE (BENADRYL) 25 mg capsule Take 1 capsule (25 mg total) by mouth every 6 (six) hours as needed for allergies. 30 capsule 0 02/26/2015 at Unknown time  . gabapentin (NEURONTIN) 300 MG capsule Take 1 capsule (300 mg total) by mouth at bedtime as needed (  nerve pain). (Patient taking differently: Take 300 mg by mouth at bedtime. ) 30 capsule 0 02/26/2015 at Unknown time  . loperamide (IMODIUM A-D) 2 MG tablet Take 2 mg by mouth 4 (four) times daily as needed for diarrhea or loose stools.   02/26/2015 at Unknown time  . LORazepam (ATIVAN) 0.5 MG tablet Take 0.25 mg by mouth every 12 (twelve) hours as needed for anxiety.   02/26/2015 at Unknown time  . nebivolol (BYSTOLIC) 10 MG tablet Take 10 mg by mouth daily.    02/26/2015 at Unknown time  . sertraline (ZOLOFT) 50 MG tablet TK 1 T PO  QHS.  4 Past Week at Unknown time  . traZODone (DESYREL) 100 MG tablet TK 1 T PO  HS.  3 02/26/2015 at Unknown time    Scheduled:  . aspirin EC  81 mg Oral Daily  . azithromycin  500 mg Intravenous Q24H  . cefTRIAXone (ROCEPHIN)  IV  1 g Intravenous Q24H  . enoxaparin (LOVENOX) injection  90 mg Subcutaneous QHS  . fluconazole (DIFLUCAN) IV  200 mg Intravenous Q24H  . nebivolol  10 mg Oral Daily  . pantoprazole  40 mg Oral Daily  . sertraline  50 mg Oral Daily  . traZODone  100 mg Oral QHS  . vancomycin  1,750 mg Intravenous Q24H   Infusions:  . sodium chloride 75 mL/hr at 02/28/15 1310   Assessment: 64 yoF c/o arm swelling and fever.  Pt with morbid obesity and hx of lymphedema of bilateral arms and legs.  IV Vancomycin per Rx for cellulitis of right arm.  She is afebrile.  Mild leukocytosis on admission.  Scr elevated above patient's baseline.  NCrCl ~ 10ml/min.   No labs since admission.   9/7 >>Vancomycin  >> 9/7>>Zithromax>> 9/7>>Rocephin>> 9/8>>Diflucan>>  9/7 blood x2: NGTD 9/7 urine: IP S pneumo urinary antigen: NEG  Goal of Therapy:  Vancomycin trough level 10-15 mcg/ml  Plan:   Continue Vancomycin 1750mg  IV q24h Check Vancomycin trough at steady state (~Mon 9/12) F/U renal fxn- Scr ordered for 9/10 F/U cx data Duration of therapy per MD Change Zithromax to PO per P&T policy (see below)  Netta Cedars, PharmD, BCPS Pager: [phone] 03/01/2015,10:51 AM  PHARMACIST - PHYSICIAN COMMUNICATION CONCERNING: Antibiotic IV to Oral Route Change Policy  RECOMMENDATION: This patient is receiving Zithromax by the intravenous route.  Based on criteria approved by the Pharmacy and Therapeutics Committee, the antibiotic(s) is/are being converted to the equivalent oral dose form(s).   DESCRIPTION: These criteria include:  Patient being treated for a respiratory tract infection, urinary tract infection, cellulitis or clostridium difficile associated diarrhea if on metronidazole  The patient is not neutropenic and does not exhibit a GI malabsorption state  The patient is  eating (either orally or via tube) and/or has been taking other orally administered medications for a least 24 hours  The patient is improving clinically and has a Tmax < 100.5  If you have questions about this conversion, please contact the Pharmacy Department  [x]    [phone] )  [facility]

## 2015-03-01 NOTE — Clinical Documentation Improvement (Signed)
Hospitalist  Can the diagnosis of bedbound be further specified?   Function Quadriplegia, including suspected or known cause and/or associated condition(s)  Functional Quadriparesis, including suspected or known cause and/or associated condition(s)   Other  Clinically Undetermined  Document any associated diagnoses/conditions.  Supporting Information: BMI 70 and over, adult / Bedbound  - Dietitian consulted for diet education.  - Bedbound for the past 3 years.   Please exercise your independent, professional judgment when responding. A specific answer is not anticipated or expected.  Thank You, Alessandra Grout, RN, BSN, CCDS,Clinical Documentation Specialist:  [phone]  [phone]=[facility] [place]- Health Information Management

## 2015-03-01 NOTE — Progress Notes (Signed)
NUTRITION NOTE    RD consulted for nutrition education regarding weight loss.  Body mass index is 68.63 kg/(m^2). Pt meets criteria for morbid obesity based on current BMI.  RD provided "Weight Loss Tips" handout from the Academy of [employer] Dietetics. Emphasized the importance of serving sizes and provided examples of correct portions of common foods. Discussed importance of controlled and consistent intake throughout the day. Provided examples of ways to balance meals/snacks and encouraged intake of high-fiber, whole grain complex carbohydrates. Emphasized the importance of hydration with calorie-free beverages and limiting sugar-sweetened beverages. Encouraged pt to discuss physical activity options with physician. Teach back method used.  Pt reports that her appetite has been decreased since the beginning of the summer due to not feeling "quite myself." She states that a neighbor stays with her from 10 AM- 9PM most days and does all of the cooking.   Per pt's food history report, she eats fruits and vegetables often during meals and snacks, she sometimes snacks on cheese cubes or apples with peanut butter, dinner meals consist of a meat and 2 vegetables, she does not eat fried foods. She drinks flavored water, 1 cup of sweet tea/week, and 2 cups of juice/day. She has more recently made some of these healthy diet choices because she is interested in a healthy lifestyle. From discussion with pt, she is doing very well from a nutrition stand-point in these recent changes and even with some digging RD was unable to find areas that needed much improvement (no binge-eating type behaviors, no junk food or dessert type items in her diet, etc).   Encouraged pt to continue her current healthy choices and to focus on her long-term goals concerning them. Pt was very open and honest with RD and appreciative of discussion and thankful for handouts to serve as a reminder of healthy choices during meals and  snacks as well as healthy portion sizes.  Expect good to fair compliance.  Current diet order is Heart Healthy.Labs and medications reviewed. No further nutrition interventions warranted at this time. RD contact information provided. If additional nutrition issues arise, please re-consult RD.     Jarome Matin, RD, LDN Inpatient Clinical Dietitian Pager # [phone] After hours/weekend pager # [phone]

## 2015-03-01 NOTE — Care Management Important Message (Signed)
Important Message  Patient Details  Name: [name] MRN: [id number] Date of Birth: [birth year]   Medicare Important Message Given:  Yes-second notification given    Camillo Flaming 03/01/2015, 3:19 [place] Message  Patient Details  Name: [name] MRN: [id number] Date of Birth: 1950/11/21   Medicare Important Message Given:  Yes-second notification given    Camillo Flaming 03/01/2015, 3:19 PM

## 2015-03-02 LAB — BASIC METABOLIC PANEL
Anion gap: 4 — ABNORMAL LOW (ref 5–15)
BUN: 14 mg/dL (ref 6–20)
CO2: 26 mmol/L (ref 22–32)
CREATININE: 1.1 mg/dL — AB (ref 0.44–1.00)
Calcium: 8.4 mg/dL — ABNORMAL LOW (ref 8.9–10.3)
Chloride: 105 mmol/L (ref 101–111)
GFR calc Af Amer: 59 mL/min — ABNORMAL LOW (ref >60)
GFR, EST NON AFRICAN AMERICAN: 51 mL/min — AB (ref >60)
GLUCOSE: 87 mg/dL (ref 65–99)
POTASSIUM: 4.2 mmol/L (ref 3.5–5.1)
SODIUM: 135 mmol/L (ref 135–145)

## 2015-03-02 NOTE — Progress Notes (Signed)
Progress Note   [name] PYK:[id number] DOB: 26-Sep-1950 DOA: 02/27/2015 PCP: Alvester Chou, NP   Brief Narrative:   [name] is an 64 y.o. female with a PMH of hypertension, depression/anxiety, breast cancer status post right lumpectomy, PAF, prior DVT, morbid obesity, OSA, and recurrent cellulitis of the extremities who was admitted with fever and left antecubital swelling consistent with recurrent cellulitis.  Assessment/Plan:   Principal Problems:   Cellulitis of left forearm and right breast with open wound of right breast - Continue topical clotrimazole, IV vancomycin and Diflucan. - Follow-up blood cultures (negative to date).    Community-acquired pneumonia - Early infiltrates noted on chest radiography in the medial right lung base. - Placed on empiric Rocephin/azithromycin. - Continue supplemental oxygen as needed. - Follow-up blood and sputum cultures. - Continue broncho-dilators as needed.  Active Problems:   BMI 70 and over, adult / Bedbound with functional quadriplegia - Evaluated and counseled by dietitian 03/01/15. - Bedbound for the past 3 years. PT evaluation requested.    HTN (hypertension) - Continue Bystolic.    Stasis dermatitis and lymphedema - Treating infection.    Lower extremity cellulitis and venous stasis / open wound posterior LLE - Wound seen and evaluated by wound care RN 02/28/15. Continue wound care per recommendations.    PAF (paroxysmal atrial fibrillation) - Rate controlled on Bystolic. Continue aspirin.    Pressure ulcer - Wound care per wound care RN recommendations.    DVT Prophylaxis - Lovenox ordered.  Family Communication: No family at the bedside. Attempted to call son, Larkin Ina 9/9, but neither number correct. Disposition Plan: Lives alone, neighbor cares for her in her home. Code Status:     Code Status Orders        Start     Ordered   02/27/15 2227  Full code   Continuous     02/27/15 2226        IV  Access:    Peripheral IV   Procedures and diagnostic studies:   No results found.   Medical Consultants:    None.  Anti-Infectives:   Vancomycin 02/27/15---> Rocephin 02/27/15---> Azithromycin 02/27/15---> Clotrimazole 02/27/15--->  Subjective:   [name] has had a BM and reports that her arm pain is improving.  No dyspnea.  Cough improved.  Objective:    Filed Vitals:   03/01/15 0856 03/01/15 1409 03/01/15 2252 03/02/15 0557  BP: 93/45 108/74 114/49 99/62  Pulse: 76 74 80 78  Temp: 98.2 F (36.8 C) 98.1 F (36.7 C) 98 F (36.7 C) 98 F (36.7 C)  TempSrc: Oral Oral Oral Oral  Resp: 20 20 20 20   Height:      Weight:      SpO2: 99% 100% 98% 100%    Intake/Output Summary (Last 24 hours) at 03/02/15 0755 Last data filed at 03/02/15 0558  Gross per 24 hour  Intake   2705 ml  Output    675 ml  Net   2030 ml   Filed Weights   02/27/15 1453 02/27/15 1458  Weight: 181.439 kg (400 lb) 181.439 kg (400 lb)    Exam: Gen:  NAD, morbidly obese Cardiovascular:  RRR, No M/R/G Respiratory:  Lungs CTAB Gastrointestinal:  Abdomen soft, NT/ND, + BS Skin/Extremities:  RUE still mildly swollen and erythematous, other wounds documented by photo in note 02/28/15.  Data Reviewed:    Labs: Basic Metabolic Panel:  Recent Labs Lab 02/27/15 1608 03/02/15 0531  NA 134* 135  K 4.3 4.2  CL 101 105  CO2 24 26  GLUCOSE 116* 87  BUN 15 14  CREATININE 1.17* 1.10*  CALCIUM 8.7* 8.4*   GFR Estimated Creatinine Clearance: 83.7 mL/min (by C-G formula based on Cr of 1.1). Liver Function Tests:  Recent Labs Lab 02/27/15 1608  AST 19  ALT 9*  ALKPHOS 71  BILITOT 1.2  PROT 7.8  ALBUMIN 2.9*   CBC:  Recent Labs Lab 02/27/15 1608  WBC 12.2*  NEUTROABS 10.7*  HGB 12.3  HCT 38.2  MCV 98.2  PLT 186   Sepsis Labs:  Recent Labs Lab 02/27/15 1608 02/27/15 1616 02/27/15 2032  WBC 12.2*  --   --   LATICACIDVEN  --  2.17* 1.21   Microbiology Recent Results  (from the past 240 hour(s))  Culture, blood (routine x 2)     Status: None (Preliminary result)   Collection Time: 02/27/15  4:05 PM  Result Value Ref Range Status   Specimen Description BLOOD BLOOD LEFT HAND  Final   Special Requests BOTTLES DRAWN AEROBIC AND ANAEROBIC 5CC  Final   Culture   Final    NO GROWTH 2 DAYS Performed at [facility]    Report Status PENDING  Incomplete  Culture, blood (routine x 2)     Status: None (Preliminary result)   Collection Time: 02/27/15  6:50 PM  Result Value Ref Range Status   Specimen Description BLOOD RIGHT HAND  Final   Special Requests IN PEDIATRIC BOTTLE 3CC  Final   Culture   Final    NO GROWTH 2 DAYS Performed at [facility]    Report Status PENDING  Incomplete  Urine culture     Status: None   Collection Time: 02/27/15  8:52 PM  Result Value Ref Range Status   Specimen Description Urine  Final   Special Requests NONE  Final   Culture   Final    NO GROWTH 1 DAY Performed at [facility]    Report Status 03/01/2015 FINAL  Final     Medications:   . aspirin EC  81 mg Oral Daily  . azithromycin  500 mg Oral Daily  . cefTRIAXone (ROCEPHIN)  IV  1 g Intravenous Q24H  . enoxaparin (LOVENOX) injection  90 mg Subcutaneous QHS  . fluconazole (DIFLUCAN) IV  200 mg Intravenous Q24H  . nebivolol  10 mg Oral Daily  . pantoprazole  40 mg Oral Daily  . sertraline  50 mg Oral Daily  . traZODone  100 mg Oral QHS  . vancomycin  1,750 mg Intravenous Q24H   Continuous Infusions: . sodium chloride 75 mL/hr at 02/28/15 1310    Time spent: 25 minutes.   LOS: 3 days   [facility] Pager [phone]. If unable to reach me by pager, please call my cell phone at [phone].  *Please refer to [email or web address], password [email or web address] to get updated schedule on who will round on this patient, as hospitalists switch teams weekly. If 7PM-7AM, please contact night-coverage at [email or web address], password [email or web address] for any overnight  needs.  03/02/2015, 7:55 AM

## 2015-03-02 NOTE — Progress Notes (Signed)
PT Cancellation Note  Patient Details Name: [name] MRN: [id number] DOB: [birth year]   Cancelled Treatment:    Reason Eval/Treat Not Completed: PT screened, no needs identified, will sign off.  Reviewed chart and spoke with pt and her neighbor.  Pt has been bed bound for 3 years and has hospital bed with air mattress at home.  Pt uses bed pan for toileting with neighbor's assistance.  Discussed role of PT and decided with pt and neighbor to hold off on PT in hospital, but recommend HHPT so that neighbor can be trained on how best to A pt in her own environment and hospital bed set up.  Explained to pt that if she changed her mind, she could request PT to be re-ordered while in hospital.    Will sign off at this time.  Santiago Glad L. Tamala Julian, [place] Pager [phone] 03/02/2015    [name] 03/02/2015, 3:07 PM

## 2015-03-03 DIAGNOSIS — L03113 Cellulitis of right upper limb: Secondary | ICD-10-CM

## 2015-03-03 NOTE — Progress Notes (Signed)
Progress Note   [name] TSV:[id number] DOB: 03-29-1951 DOA: 02/27/2015 PCP: Alvester Chou, NP   Brief Narrative:   [name] is an 64 y.o. female with a PMH of hypertension, depression/anxiety, breast cancer status post right lumpectomy, PAF, prior DVT, morbid obesity, OSA, and recurrent cellulitis of the extremities who was admitted with fever and left antecubital swelling consistent with recurrent cellulitis.  Assessment/Plan:   Principal Problems:   Cellulitis of left forearm and right breast with open wound of right breast - Continue topical clotrimazole, IV vancomycin and Diflucan. - Follow-up blood cultures (negative to date).    Community-acquired pneumonia - Early infiltrates noted on chest radiography in the medial right lung base. - Placed on empiric Rocephin/azithromycin. - Continue supplemental oxygen as needed. - Follow-up blood and sputum cultures. - Continue broncho-dilators as needed.  Active Problems:   BMI 70 and over, adult / Bedbound with functional quadriplegia - Evaluated and counseled by dietitian 03/01/15. - Bedbound for the past 3 years. PT evaluation deferred with recommendations by PT to pursue outpatient treatment.    HTN (hypertension) - Continue Bystolic.    Stasis dermatitis and lymphedema - Treating infection.    Lower extremity cellulitis and venous stasis / open wound posterior LLE - Wound seen and evaluated by wound care RN 02/28/15. Continue wound care per recommendations.    PAF (paroxysmal atrial fibrillation) - Rate controlled on Bystolic. Continue aspirin.    Pressure ulcer - Wound care per wound care RN recommendations.    DVT Prophylaxis - Lovenox ordered.  Family Communication: No family at the bedside. Attempted to call son, [name] 9/9, but neither number correct. Disposition Plan: Lives alone, neighbor cares for her in her home. Possibly discharge home 03/04/15. Code Status:     Code Status Orders        Start     Ordered   02/27/15 2227  Full code   Continuous     02/27/15 2226        IV Access:    Peripheral IV   Procedures and diagnostic studies:   No results found.   Medical Consultants:    None.  Anti-Infectives:   Vancomycin 02/27/15---> Rocephin 02/27/15---> Azithromycin 02/27/15---> Clotrimazole 02/27/15--->  Subjective:   [name] says that her arm feels much better with no further complaints of a burning type pain. Appetite okay. Bowels are moving. No dyspnea or cough.  Objective:    Filed Vitals:   03/02/15 0557 03/02/15 1444 03/02/15 2119 03/03/15 0602  BP: 99/62 115/69 101/64 101/45  Pulse: 78 85 76 77  Temp: 98 F (36.7 C) 98.2 F (36.8 C) 98.1 F (36.7 C) 97.7 F (36.5 C)  TempSrc: Oral Oral Oral Oral  Resp: 20 20 20 20   Height:      Weight:      SpO2: 100% 100% 99% 99%    Intake/Output Summary (Last 24 hours) at 03/03/15 0941 Last data filed at 03/03/15 0603  Gross per 24 hour  Intake   2625 ml  Output    802 ml  Net   1823 ml   Filed Weights   02/27/15 1453 02/27/15 1458  Weight: 181.439 kg (400 lb) 181.439 kg (400 lb)    Exam: Gen:  NAD, morbidly obese Cardiovascular:  RRR, No M/R/G Respiratory:  Lungs CTAB Gastrointestinal:  Abdomen soft, NT/ND, + BS Skin/Extremities:  RUE still mildly swollen and erythematous, other wounds documented by photo in note 02/28/15.  Data Reviewed:    Labs: Basic  Metabolic Panel:  Recent Labs Lab 02/27/15 1608 03/02/15 0531  NA 134* 135  K 4.3 4.2  CL 101 105  CO2 24 26  GLUCOSE 116* 87  BUN 15 14  CREATININE 1.17* 1.10*  CALCIUM 8.7* 8.4*   GFR Estimated Creatinine Clearance: 83.7 mL/min (by C-G formula based on Cr of 1.1). Liver Function Tests:  Recent Labs Lab 02/27/15 1608  AST 19  ALT 9*  ALKPHOS 71  BILITOT 1.2  PROT 7.8  ALBUMIN 2.9*   CBC:  Recent Labs Lab 02/27/15 1608  WBC 12.2*  NEUTROABS 10.7*  HGB 12.3  HCT 38.2  MCV 98.2  PLT 186   Sepsis  Labs:  Recent Labs Lab 02/27/15 1608 02/27/15 1616 02/27/15 2032  WBC 12.2*  --   --   LATICACIDVEN  --  2.17* 1.21   Microbiology Recent Results (from the past 240 hour(s))  Culture, blood (routine x 2)     Status: None (Preliminary result)   Collection Time: 02/27/15  4:05 PM  Result Value Ref Range Status   Specimen Description BLOOD BLOOD LEFT HAND  Final   Special Requests BOTTLES DRAWN AEROBIC AND ANAEROBIC 5CC  Final   Culture   Final    NO GROWTH 3 DAYS Performed at [facility]    Report Status PENDING  Incomplete  Culture, blood (routine x 2)     Status: None (Preliminary result)   Collection Time: 02/27/15  6:50 PM  Result Value Ref Range Status   Specimen Description BLOOD RIGHT HAND  Final   Special Requests IN PEDIATRIC BOTTLE 3CC  Final   Culture   Final    NO GROWTH 3 DAYS Performed at [facility]    Report Status PENDING  Incomplete  Urine culture     Status: None   Collection Time: 02/27/15  8:52 PM  Result Value Ref Range Status   Specimen Description Urine  Final   Special Requests NONE  Final   Culture   Final    NO GROWTH 1 DAY Performed at [facility]    Report Status 03/01/2015 FINAL  Final     Medications:   . aspirin EC  81 mg Oral Daily  . azithromycin  500 mg Oral Daily  . cefTRIAXone (ROCEPHIN)  IV  1 g Intravenous Q24H  . enoxaparin (LOVENOX) injection  90 mg Subcutaneous QHS  . fluconazole (DIFLUCAN) IV  200 mg Intravenous Q24H  . nebivolol  10 mg Oral Daily  . pantoprazole  40 mg Oral Daily  . sertraline  50 mg Oral Daily  . traZODone  100 mg Oral QHS  . vancomycin  1,750 mg Intravenous Q24H   Continuous Infusions: . sodium chloride 75 mL/hr at 03/02/15 1235    Time spent: 25 minutes.   LOS: 4 days   [place] [facility] Pager [phone]. If unable to reach me by pager, please call my cell phone at [phone].  *Please refer to [email or web address], password [email or web address] to get updated schedule on who  will round on this patient, as hospitalists switch teams weekly. If 7PM-7AM, please contact night-coverage at [email or web address], password [email or web address] for any overnight needs.  03/03/2015, 9:41 AM

## 2015-03-04 LAB — C DIFFICILE QUICK SCREEN W PCR REFLEX
C DIFFICLE (CDIFF) ANTIGEN: POSITIVE — AB
C Diff toxin: NEGATIVE

## 2015-03-04 LAB — CULTURE, BLOOD (ROUTINE X 2)
Culture: NO GROWTH
Culture: NO GROWTH

## 2015-03-04 LAB — BASIC METABOLIC PANEL
Anion gap: 7 (ref 5–15)
BUN: 8 mg/dL (ref 6–20)
CO2: 22 mmol/L (ref 22–32)
CREATININE: 0.95 mg/dL (ref 0.44–1.00)
Calcium: 8.4 mg/dL — ABNORMAL LOW (ref 8.9–10.3)
Chloride: 107 mmol/L (ref 101–111)
GFR calc Af Amer: >60 mL/min (ref >60)
Glucose, Bld: 93 mg/dL (ref 65–99)
Potassium: 5.3 mmol/L — ABNORMAL HIGH (ref 3.5–5.1)
SODIUM: 136 mmol/L (ref 135–145)

## 2015-03-04 MED ORDER — SODIUM POLYSTYRENE SULFONATE 15 GM/60ML PO SUSP
15.0000 g | Freq: Once | ORAL | Status: DC
  Filled 2015-03-04: qty 60

## 2015-03-04 NOTE — Progress Notes (Signed)
Progress Note   [name] ZOX:[id number] DOB: 03-30-1951 DOA: 02/27/2015 PCP: Alvester Chou, NP   Brief Narrative:   [name] is an 64 y.o. female with a PMH of hypertension, depression/anxiety, breast cancer status post right lumpectomy, PAF, prior DVT, morbid obesity, OSA, and recurrent cellulitis of the extremities who was admitted with fever and left antecubital swelling consistent with recurrent cellulitis.  Assessment/Plan:   Principal Problems:   Cellulitis of left forearm and right breast with open wound of right breast - Continue topical clotrimazole, IV vancomycin and Diflucan. - Follow-up blood cultures (negative to date).    Community-acquired pneumonia - Early infiltrates noted on chest radiography in the medial right lung base. - Placed on empiric Rocephin/azithromycin. - Continue supplemental oxygen as needed. - Follow-up blood and sputum cultures. - Continue broncho-dilators as needed.  Active Problems:   Diarrhea - Has had 3 stools in the last 24 hours, loose, not on laxatives, on antibiotics. - Send stool, rule out C. Diff.    BMI 70 and over, adult / Bedbound with functional quadriplegia - Evaluated and counseled by dietitian 03/01/15. - Bedbound for the past 3 years. PT evaluation deferred with recommendations by PT to pursue outpatient treatment.    HTN (hypertension) - Continue Bystolic.    Stasis dermatitis and lymphedema - Treating infection.    Lower extremity cellulitis and venous stasis / open wound posterior LLE - Wound seen and evaluated by wound care RN 02/28/15. Continue wound care per recommendations.    PAF (paroxysmal atrial fibrillation) - Rate controlled on Bystolic. Continue aspirin.    Pressure ulcer - Wound care per wound care RN recommendations.    DVT Prophylaxis - Lovenox ordered.  Family Communication: No family at the bedside. Attempted to call son, Larkin Ina 9/9, but neither number correct. Disposition Plan: Lives  alone, neighbor cares for her in her home. Possibly discharge home 03/04/15. Code Status:     Code Status Orders        Start     Ordered   02/27/15 2227  Full code   Continuous     02/27/15 2226        IV Access:    Peripheral IV   Procedures and diagnostic studies:   No results found.   Medical Consultants:    None.  Anti-Infectives:   Vancomycin 02/27/15---> Rocephin 02/27/15---> Azithromycin 02/27/15---> Clotrimazole 02/27/15--->  Subjective:   [name] says that her arm feels much better but still has some pain at the wound on her right breast, describes it as "pinprick like".  3 loose stools in the past 24 hours.  Appetite ok.  Objective:    Filed Vitals:   03/03/15 0602 03/03/15 1508 03/03/15 2145 03/04/15 0652  BP: 101/45 132/70 131/84 135/81  Pulse: 77 79 75 77  Temp: 97.7 F (36.5 C) 98.3 F (36.8 C) 98.6 F (37 C) 97.6 F (36.4 C)  TempSrc: Oral Oral Oral Oral  Resp: 20 18 18 18   Height:      Weight:      SpO2: 99% 95% 96% 100%    Intake/Output Summary (Last 24 hours) at 03/04/15 0759 Last data filed at 03/04/15 0010  Gross per 24 hour  Intake   1380 ml  Output   1401 ml  Net    -21 ml   Filed Weights   02/27/15 1453 02/27/15 1458  Weight: 181.439 kg (400 lb) 181.439 kg (400 lb)    Exam: Gen:  NAD, morbidly obese Cardiovascular:  RRR, No M/R/G Respiratory:  Lungs CTAB Gastrointestinal:  Abdomen soft, NT/ND, + BS Skin/Extremities:  RUE still mildly swollen and erythematous, other wounds documented by photo in note 02/28/15.  Data Reviewed:    Labs: Basic Metabolic Panel:  Recent Labs Lab 02/27/15 1608 03/02/15 0531 03/04/15 0545  NA 134* 135 136  K 4.3 4.2 5.3*  CL 101 105 107  CO2 24 26 22   GLUCOSE 116* 87 93  BUN 15 14 8   CREATININE 1.17* 1.10* 0.95  CALCIUM 8.7* 8.4* 8.4*   GFR Estimated Creatinine Clearance: 96.9 mL/min (by C-G formula based on Cr of 0.95). Liver Function Tests:  Recent Labs Lab  02/27/15 1608  AST 19  ALT 9*  ALKPHOS 71  BILITOT 1.2  PROT 7.8  ALBUMIN 2.9*   CBC:  Recent Labs Lab 02/27/15 1608  WBC 12.2*  NEUTROABS 10.7*  HGB 12.3  HCT 38.2  MCV 98.2  PLT 186   Sepsis Labs:  Recent Labs Lab 02/27/15 1608 02/27/15 1616 02/27/15 2032  WBC 12.2*  --   --   LATICACIDVEN  --  2.17* 1.21   Microbiology Recent Results (from the past 240 hour(s))  Culture, blood (routine x 2)     Status: None (Preliminary result)   Collection Time: 02/27/15  4:05 PM  Result Value Ref Range Status   Specimen Description BLOOD BLOOD LEFT HAND  Final   Special Requests BOTTLES DRAWN AEROBIC AND ANAEROBIC 5CC  Final   Culture   Final    NO GROWTH 4 DAYS Performed at [facility]    Report Status PENDING  Incomplete  Culture, blood (routine x 2)     Status: None (Preliminary result)   Collection Time: 02/27/15  6:50 PM  Result Value Ref Range Status   Specimen Description BLOOD RIGHT HAND  Final   Special Requests IN PEDIATRIC BOTTLE 3CC  Final   Culture   Final    NO GROWTH 4 DAYS Performed at [facility]    Report Status PENDING  Incomplete  Urine culture     Status: None   Collection Time: 02/27/15  8:52 PM  Result Value Ref Range Status   Specimen Description Urine  Final   Special Requests NONE  Final   Culture   Final    NO GROWTH 1 DAY Performed at [facility]    Report Status 03/01/2015 FINAL  Final     Medications:   . aspirin EC  81 mg Oral Daily  . azithromycin  500 mg Oral Daily  . cefTRIAXone (ROCEPHIN)  IV  1 g Intravenous Q24H  . enoxaparin (LOVENOX) injection  90 mg Subcutaneous QHS  . fluconazole (DIFLUCAN) IV  200 mg Intravenous Q24H  . nebivolol  10 mg Oral Daily  . pantoprazole  40 mg Oral Daily  . sertraline  50 mg Oral Daily  . sodium polystyrene  15 g Oral Once  . traZODone  100 mg Oral QHS  . vancomycin  1,750 mg Intravenous Q24H   Continuous Infusions: . sodium chloride 75 mL/hr at 03/02/15  1235    Time spent: 25 minutes.   LOS: 5 days   [place] [facility] Pager [phone]. If unable to reach me by pager, please call my cell phone at [phone].  *Please refer to [email or web address], password [email or web address] to get updated schedule on who will round on this patient, as hospitalists switch teams weekly. If 7PM-7AM, please contact night-coverage at [email or web address], password [email or web address] for any overnight needs.  03/04/2015, 7:59 AM

## 2015-03-04 NOTE — Care Management Important Message (Signed)
Important Message  Patient Details IM Letter given to Rhonda/ Case Manager to present to [place] Message  Patient Details  Name: [name] MRN: [id number] Date of Birth: 09-Jan-1951   Medicare Important Message Given:  Yes-third notification given    Camillo Flaming 03/04/2015, 12:46 PM Name: [name] MRN: [id number] Date of Birth: Aug 13, 1950   Medicare Important Message Given:  Yes-third notification given    Camillo Flaming 03/04/2015, 12:45 PM

## 2015-03-04 NOTE — Progress Notes (Signed)
Ur done /Elayne Gruver,RN,BSN,CCM

## 2015-03-04 NOTE — Progress Notes (Addendum)
ANTIBIOTIC CONSULT NOTE  Pharmacy Consult for Vancomycin Indication: Cellulitis of right arm with R breast wound  Allergies  Allergen Reactions  . Effexor [Venlafaxine Hydrochloride] Anxiety    Made her want to kill herself    Patient Measurements: Height: 5\' 4"  (162.6 cm) Weight: (!) 400 lb (181.439 kg) IBW/kg (Calculated) : 54.7   Vital Signs: Temp: 97.6 F (36.4 C) (09/12 0652) Temp Source: Oral (09/12 0652) BP: 135/81 mmHg (09/12 0652) Pulse Rate: 77 (09/12 0652) Intake/Output from previous day: 09/11 0701 - 09/12 0700 In: 1380 [P.O.:480; I.V.:900] Out: 1401 [Urine:1400; Stool:1] Intake/Output from this shift:    Labs:  Recent Labs  03/02/15 0531 03/04/15 0545  CREATININE 1.10* 0.95   Estimated Creatinine Clearance: 96.9 mL/min (by C-G formula based on Cr of 0.95). No results for input(s): VANCOTROUGH, VANCOPEAK, VANCORANDOM, GENTTROUGH, GENTPEAK, GENTRANDOM, TOBRATROUGH, TOBRAPEAK, TOBRARND, AMIKACINPEAK, AMIKACINTROU, AMIKACIN in the last 72 hours.   Microbiology: Recent Results (from the past 720 hour(s))  Culture, blood (routine x 2)     Status: None (Preliminary result)   Collection Time: 02/27/15  4:05 PM  Result Value Ref Range Status   Specimen Description BLOOD BLOOD LEFT HAND  Final   Special Requests BOTTLES DRAWN AEROBIC AND ANAEROBIC 5CC  Final   Culture   Final    NO GROWTH 4 DAYS Performed at [facility]    Report Status PENDING  Incomplete  Culture, blood (routine x 2)     Status: None (Preliminary result)   Collection Time: 02/27/15  6:50 PM  Result Value Ref Range Status   Specimen Description BLOOD RIGHT HAND  Final   Special Requests IN PEDIATRIC BOTTLE 3CC  Final   Culture   Final    NO GROWTH 4 DAYS Performed at [facility]    Report Status PENDING  Incomplete  Urine culture     Status: None   Collection Time: 02/27/15  8:52 PM  Result Value Ref Range Status   Specimen Description Urine  Final   Special  Requests NONE  Final   Culture   Final    NO GROWTH 1 DAY Performed at [facility]    Report Status 03/01/2015 FINAL  Final    Medical History: Past Medical History  Diagnosis Date  . Obesities, morbid   . Hypertension   . Cellulitis     right breast, right arm, ble  . Depression   . Anxiety   . 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 Breast cancer 1988  . Dysrhythmia   . PAF (paroxysmal atrial fibrillation) 06/04/2012  . Pneumonia     "several times; last time was in the 1990's" (04/07/2013)  . Chronic bronchitis     "maybe 4 years in a row; last time was ~1990" (04/07/2013)  . Obstructive sleep apnea on CPAP 06/04/2012  . Exertional shortness of breath   . Arthritis     "knees; fingers/hands" (04/07/2013)  . Stasis dermatitis   . Right leg DVT 04/06/2013    "behind my knee" (04/07/2013)    Medications:  Prescriptions prior to admission  Medication Sig Dispense Refill Last Dose  . aspirin EC 81 MG tablet Take 1 tablet (81 mg total) by mouth daily.   02/26/2015 at Unknown time  . Cholecalciferol (VITAMIN D-3) 5000 UNITS TABS Take 1 capsule by mouth daily. 30 tablet 0  02/26/2015 at Unknown time  . diphenhydrAMINE (BENADRYL) 25 mg capsule Take 1 capsule (25 mg total) by mouth every 6 (six) hours as needed for allergies. 30 capsule 0 02/26/2015 at Unknown time  . gabapentin (NEURONTIN) 300 MG capsule Take 1 capsule (300 mg total) by mouth at bedtime as needed (nerve pain). (Patient taking differently: Take 300 mg by mouth at bedtime. ) 30 capsule 0 02/26/2015 at Unknown time  . loperamide (IMODIUM A-D) 2 MG tablet Take 2 mg by mouth 4 (four) times daily as needed for diarrhea or loose stools.   02/26/2015 at Unknown time  . LORazepam (ATIVAN) 0.5 MG tablet Take 0.25 mg by mouth every 12 (twelve) hours as needed for anxiety.   02/26/2015 at Unknown time  . nebivolol (BYSTOLIC) 10 MG  tablet Take 10 mg by mouth daily.    02/26/2015 at Unknown time  . sertraline (ZOLOFT) 50 MG tablet TK 1 T PO  QHS.  4 Past Week at Unknown time  . traZODone (DESYREL) 100 MG tablet TK 1 T PO  HS.  3 02/26/2015 at Unknown time   Scheduled:  . aspirin EC  81 mg Oral Daily  . azithromycin  500 mg Oral Daily  . cefTRIAXone (ROCEPHIN)  IV  1 g Intravenous Q24H  . enoxaparin (LOVENOX) injection  90 mg Subcutaneous QHS  . fluconazole (DIFLUCAN) IV  200 mg Intravenous Q24H  . nebivolol  10 mg Oral Daily  . pantoprazole  40 mg Oral Daily  . sertraline  50 mg Oral Daily  . traZODone  100 mg Oral QHS  . vancomycin  1,750 mg Intravenous Q24H   Infusions:  . sodium chloride 75 mL/hr at 03/02/15 1235   Assessment: 68 yoF presented to the ED on 9/7 with  c/o arm swelling and fever.  Pt with morbid obesity and hx of lymphedema of bilateral arms and legs.  IV Vancomycin per Rx for cellulitis of right arm.   Today, 03/04/2015: - Afeb, WBC increased - Scr 0.95 (quadriplegia), CrCl ~57 N - 9/7: early infiltrates   9/7 >>vancomycin  Rx>> 9/7>>Zithromax>> 9/7>>Rocephin>> 9/8>>Diflucan>>  9/7 blood x2: NGTD 9/7 urine: neg FINAL S pneumo urinary antigen: NEG  Goal of Therapy:  Vancomycin trough level 10-15 mcg/ml  Plan:   Continue Vancomycin 1750mg  IV q24h  With plan for possible discharge today, will hold off on checking vancomycin level. Will check Vancomycin trough on 9/13 to assess drug clearance, if she is still here F/U cx data Continue azithromycin and ceftriaxone per MD for PNA   Dia Sitter, PharmD, BCPS 03/04/2015 8:27 AM

## 2015-03-05 DIAGNOSIS — J181 Lobar pneumonia, unspecified organism: Secondary | ICD-10-CM | POA: Diagnosis present

## 2015-03-05 DIAGNOSIS — L03115 Cellulitis of right lower limb: Secondary | ICD-10-CM

## 2015-03-05 LAB — CBC
HEMATOCRIT: 38.9 % (ref 36.0–46.0)
HEMOGLOBIN: 12.2 g/dL (ref 12.0–15.0)
MCH: 31.7 pg (ref 26.0–34.0)
MCHC: 31.4 g/dL (ref 30.0–36.0)
MCV: 101 fL — AB (ref 78.0–100.0)
Platelets: 185 10*3/uL (ref 150–400)
RBC: 3.85 MIL/uL — ABNORMAL LOW (ref 3.87–5.11)
RDW: 15.4 % (ref 11.5–15.5)
WBC: 8.5 10*3/uL (ref 4.0–10.5)

## 2015-03-05 LAB — BASIC METABOLIC PANEL
ANION GAP: 4 — AB (ref 5–15)
BUN: 7 mg/dL (ref 6–20)
CHLORIDE: 107 mmol/L (ref 101–111)
CO2: 26 mmol/L (ref 22–32)
Calcium: 8.6 mg/dL — ABNORMAL LOW (ref 8.9–10.3)
Creatinine, Ser: 0.86 mg/dL (ref 0.44–1.00)
GFR calc Af Amer: >60 mL/min (ref >60)
GLUCOSE: 99 mg/dL (ref 65–99)
POTASSIUM: 4.4 mmol/L (ref 3.5–5.1)
Sodium: 137 mmol/L (ref 135–145)

## 2015-03-05 MED ORDER — SACCHAROMYCES BOULARDII 250 MG PO CAPS
250.0000 mg | ORAL_CAPSULE | Freq: Two times a day (BID) | ORAL | Status: DC
  Administered 2015-03-05 – 2015-03-07 (×5): 250 mg via ORAL
  Filled 2015-03-05 (×6): qty 1

## 2015-03-05 MED ORDER — DOXYCYCLINE HYCLATE 100 MG PO TABS
100.0000 mg | ORAL_TABLET | Freq: Two times a day (BID) | ORAL | Status: DC
  Administered 2015-03-05 – 2015-03-07 (×5): 100 mg via ORAL
  Filled 2015-03-05 (×6): qty 1

## 2015-03-05 MED ORDER — FLUCONAZOLE 200 MG PO TABS
200.0000 mg | ORAL_TABLET | Freq: Every day | ORAL | Status: DC
  Administered 2015-03-05 – 2015-03-07 (×3): 200 mg via ORAL
  Filled 2015-03-05 (×3): qty 1

## 2015-03-05 MED ORDER — FLUCONAZOLE 200 MG PO TABS
200.0000 mg | ORAL_TABLET | Freq: Every day | ORAL | Status: DC
  Administered 2015-03-05: 200 mg via ORAL
  Filled 2015-03-05: qty 1

## 2015-03-05 NOTE — Progress Notes (Signed)
PHARMACIST - PHYSICIAN COMMUNICATION DR: Rama CONCERNING: Antibiotic IV to Oral Route Change Policy  RECOMMENDATION: This patient is receiving Diflucan by the intravenous route.  Based on criteria approved by the Pharmacy and Therapeutics Committee, the antibiotic(s) is/are being converted to the equivalent oral dose form(s).   DESCRIPTION: These criteria include:  Patient being treated for a respiratory tract infection, urinary tract infection, cellulitis or clostridium difficile associated diarrhea if on metronidazole  The patient is not neutropenic and does not exhibit a GI malabsorption state  The patient is eating (either orally or via tube) and/or has been taking other orally administered medications for a least 24 hours  The patient is improving clinically and has a Tmax < 100.5  If you have questions about this conversion, please contact the Pharmacy Department  []   [phone] )  [facility] []   [phone] )  [facility] []   [phone] )  [facility] []   [phone] )  [facility] [x]   [phone] )  [place], PharmD, [place] Pager: [phone] 03/05/2015@9 :35 AM

## 2015-03-05 NOTE — Progress Notes (Addendum)
Progress Note   [name] ZOX:[id number] DOB: [birth year] DOA: 02/27/2015 PCP: Alvester Chou, NP   Brief Narrative:   [name] is an 64 y.o. female with a PMH of hypertension, depression/anxiety, breast cancer status post right lumpectomy, PAF, prior DVT, morbid obesity, OSA, and recurrent cellulitis of the extremities who was admitted with fever and left antecubital swelling consistent with recurrent cellulitis.  Assessment/Plan:   Principal Problems:   Cellulitis of left forearm and right breast with open wound of right breast - Currently on clotrimazole, IV vancomycin and Diflucan. - Blood cultures negative. - Switch Diflucan to PO route and change IV Vancomycin to doxycycline. - If no worsening diarrhea, can possibly go home tomorrow.    Community acquired pneumonia - Early infiltrates noted on chest radiography in the medial right lung base. - Placed on empiric Rocephin/azithromycin. Discontinue given stability of respiratory status. - Continue supplemental oxygen as needed. - Blood cultures negative. - Continue broncho-dilators as needed.  Active Problems:   Diarrhea - Has had 3 stools yesterday, 1 in the last 24 hours. - C. difficile antigen positive but toxin negative, likely colonized.  Rocephin/Azithro D/C'd.  Florastor started. - Monitor for worsening diarrhea.    BMI 70 and over, adult / Bedbound with functional quadriplegia - Evaluated and counseled by dietitian 03/01/15. - Bedbound for the past 3 years. PT evaluation deferred with recommendations by PT to pursue outpatient treatment.    HTN (hypertension) - Continue Bystolic.    Stasis dermatitis and lymphedema - Treating infection.    Lower extremity cellulitis and venous stasis / open wound posterior LLE - Wound seen and evaluated by wound care RN 02/28/15. Continue wound care per recommendations.    PAF (paroxysmal atrial fibrillation) - Rate controlled on Bystolic. Continue aspirin.    Pressure  ulcer - Wound care per wound care RN recommendations.    DVT Prophylaxis - Lovenox ordered.  Family Communication: No family at the bedside. Attempted to call son, Larkin Ina 9/9, but neither number correct. Disposition Plan: Lives alone, neighbor cares for her in her home. D/C 03/06/15 if diarrhea resolved. Code Status:     Code Status Orders        Start     Ordered   02/27/15 2227  Full code   Continuous     02/27/15 2226        IV Access:    Peripheral IV   Procedures and diagnostic studies:   Dg Chest 2 View  02/27/2015   CLINICAL DATA:  Persistent cough for several months  EXAM: CHEST - 2 VIEW  COMPARISON:  03/08/2013  FINDINGS: Cardiac shadow is within normal limits. Increased density is noted along the medial aspect of the right lung base. A portion of this is related to the patient's body habitus although an early infiltrate cannot be excluded. The lateral images are of little utility due to the patient's body habitus.  IMPRESSION: Changes suggestive of early infiltrate in the medial right lung base.   Electronically Signed   By: Inez Catalina M.D.   On: 02/27/2015 17:03    Medical Consultants:    None.  Anti-Infectives:   Vancomycin 02/27/15--->03/05/15 Rocephin 02/27/15---> 03/05/15 Azithromycin 02/27/15---> 03/05/15 Clotrimazole 02/27/15---> Doxycycline 03/05/15--->  Subjective:   [name] says that her arm feels much better but complains of nausea, fatigue and generalized non-specific complaints of not feeling well.  1 loose stool in 24 hours.  Does not feel well enough to go home.  Objective:  Filed Vitals:   03/04/15 0652 03/04/15 1533 03/04/15 2127 03/05/15 0604  BP: 135/81 108/90 117/54 100/66  Pulse: 77 76 74 72  Temp: 97.6 F (36.4 C) 97.8 F (36.6 C) 98 F (36.7 C) 98.2 F (36.8 C)  TempSrc: Oral Oral Oral Oral  Resp: 18 18 18 18   Height:      Weight:      SpO2: 100% 100% 99% 99%    Intake/Output Summary (Last 24 hours) at 03/05/15  0753 Last data filed at 03/05/15 0655  Gross per 24 hour  Intake 3038.75 ml  Output   1552 ml  Net 1486.75 ml   Filed Weights   02/27/15 1453 02/27/15 1458  Weight: 181.439 kg (400 lb) 181.439 kg (400 lb)    Exam: Gen:  NAD, morbidly obese Cardiovascular:  RRR, No M/R/G Respiratory:  Lungs CTAB Gastrointestinal:  Abdomen soft, NT/ND, + BS Skin/Extremities:  RUE minimally erythematous, macerated skin in antecubital space improved, other wounds documented by photo in note 02/28/15.  Data Reviewed:    Labs: Basic Metabolic Panel:  Recent Labs Lab 02/27/15 1608 03/02/15 0531 03/04/15 0545 03/05/15 0635  NA 134* 135 136 137  K 4.3 4.2 5.3* 4.4  CL 101 105 107 107  CO2 24 26 22 26   GLUCOSE 116* 87 93 99  BUN 15 14 8 7   CREATININE 1.17* 1.10* 0.95 0.86  CALCIUM 8.7* 8.4* 8.4* 8.6*   GFR Estimated Creatinine Clearance: 107.1 mL/min (by C-G formula based on Cr of 0.86). Liver Function Tests:  Recent Labs Lab 02/27/15 1608  AST 19  ALT 9*  ALKPHOS 71  BILITOT 1.2  PROT 7.8  ALBUMIN 2.9*   CBC:  Recent Labs Lab 02/27/15 1608 03/05/15 0635  WBC 12.2* 8.5  NEUTROABS 10.7*  --   HGB 12.3 12.2  HCT 38.2 38.9  MCV 98.2 101.0*  PLT 186 185   Sepsis Labs:  Recent Labs Lab 02/27/15 1608 02/27/15 1616 02/27/15 2032 03/05/15 0635  WBC 12.2*  --   --  8.5  LATICACIDVEN  --  2.17* 1.21  --    Microbiology Recent Results (from the past 240 hour(s))  Culture, blood (routine x 2)     Status: None   Collection Time: 02/27/15  4:05 PM  Result Value Ref Range Status   Specimen Description BLOOD BLOOD LEFT HAND  Final   Special Requests BOTTLES DRAWN AEROBIC AND ANAEROBIC 5CC  Final   Culture   Final    NO GROWTH 5 DAYS Performed at [facility]    Report Status 03/04/2015 FINAL  Final  Culture, blood (routine x 2)     Status: None   Collection Time: 02/27/15  6:50 PM  Result Value Ref Range Status   Specimen Description BLOOD RIGHT HAND  Final    Special Requests IN PEDIATRIC BOTTLE 3CC  Final   Culture   Final    NO GROWTH 5 DAYS Performed at [facility]    Report Status 03/04/2015 FINAL  Final  Urine culture     Status: None   Collection Time: 02/27/15  8:52 PM  Result Value Ref Range Status   Specimen Description Urine  Final   Special Requests NONE  Final   Culture   Final    NO GROWTH 1 DAY Performed at [facility]    Report Status 03/01/2015 FINAL  Final  C difficile quick scan w PCR reflex     Status: Abnormal   Collection Time: 03/04/15  11:39 AM  Result Value Ref Range Status   C Diff antigen POSITIVE (A) NEGATIVE Final   C Diff toxin NEGATIVE NEGATIVE Final   C Diff interpretation   Final    C. difficile present, but toxin not detected. This indicates colonization. In most cases, this does not require treatment. If patient has signs and symptoms consistent with colitis, consider treatment.     Medications:   . aspirin EC  81 mg Oral Daily  . azithromycin  500 mg Oral Daily  . cefTRIAXone (ROCEPHIN)  IV  1 g Intravenous Q24H  . enoxaparin (LOVENOX) injection  90 mg Subcutaneous QHS  . fluconazole (DIFLUCAN) IV  200 mg Intravenous Q24H  . nebivolol  10 mg Oral Daily  . pantoprazole  40 mg Oral Daily  . sertraline  50 mg Oral Daily  . traZODone  100 mg Oral QHS  . vancomycin  1,750 mg Intravenous Q24H   Continuous Infusions: . sodium chloride 75 mL/hr at 03/04/15 2226    Time spent: 25 minutes.   LOS: 6 days   [facility] Pager [phone]. If unable to reach me by pager, please call my cell phone at [phone].  *Please refer to [email or web address], password [email or web address] to get updated schedule on who will round on this patient, as hospitalists switch teams weekly. If 7PM-7AM, please contact night-coverage at [email or web address], password [email or web address] for any overnight needs.  03/05/2015, 7:53 AM

## 2015-03-05 NOTE — Care Management Note (Signed)
Case Management Note  Patient Details  Name: [name] MRN: [id number] Date of Birth: 1951/01/18  Subjective/Objective:    Active w/[facility] HHRN-wound care/aide-[place] tel#[phone]. PT-recc HHPT.If Md agree please order HHRN,HHPT/HH Nurse's aide, face to face.Please fax [place] order to [phone].               Action/Plan:d/c home w/HHC.   Expected Discharge Date:   (unknown)               Expected Discharge Plan:  [place]  In-House Referral:  NA  Discharge planning Services  CM Consult  Post Acute Care Choice:  NA, Home Health (Active w/[facility]-HHRN/HH Nurse's aide.) Choice offered to:  NA  DME Arranged:    DME Agency:     HH Arranged:    [place] Agency:     Status of Service:  Completed, signed off  Medicare Important Message Given:  Yes-third notification given Date Medicare IM Given:    Medicare IM give by:    Date Additional Medicare IM Given:    Additional Medicare Important Message give by:     If discussed at [place] of Stay Meetings, dates discussed:    Additional Comments:  Dessa Phi, RN 03/05/2015, 4:15 PM

## 2015-03-06 DIAGNOSIS — F329 Major depressive disorder, single episode, unspecified: Secondary | ICD-10-CM | POA: Diagnosis present

## 2015-03-06 DIAGNOSIS — J181 Lobar pneumonia, unspecified organism: Secondary | ICD-10-CM

## 2015-03-06 DIAGNOSIS — E875 Hyperkalemia: Secondary | ICD-10-CM

## 2015-03-06 DIAGNOSIS — F32A Depression, unspecified: Secondary | ICD-10-CM | POA: Diagnosis present

## 2015-03-06 DIAGNOSIS — K529 Noninfective gastroenteritis and colitis, unspecified: Secondary | ICD-10-CM | POA: Diagnosis present

## 2015-03-06 DIAGNOSIS — B369 Superficial mycosis, unspecified: Secondary | ICD-10-CM | POA: Diagnosis present

## 2015-03-06 DIAGNOSIS — N61 Inflammatory disorders of breast: Secondary | ICD-10-CM

## 2015-03-06 LAB — CREATININE, SERUM
Creatinine, Ser: 0.9 mg/dL (ref 0.44–1.00)
GFR calc Af Amer: >60 mL/min (ref >60)

## 2015-03-06 NOTE — Progress Notes (Addendum)
Patient ID: [name], female   DOB: 1951/03/06, 64 y.o.   MRN: [id number] [facility] PROGRESS NOTE  [name] WUJ:[id number] DOB: 1950-08-23 DOA: 02/27/2015 PCP: Alvester Chou, NP  Brief narrative:    64 y.o. female with a past medical history of hypertension, depression/anxiety, breast cancer status post right lumpectomy, PAF, prior DVT, morbid obesity, OSA, and recurrent cellulitis of the extremities who was admitted with fever and left forearm swelling and erythema consistent with recurrent cellulitis.  Anticipated discharge: Home with home health services likely by 03/07/2015. Has had 2 soft stools today. Will observe for next 24 hours.    Assessment/Plan:    Principal Problems: Cellulitis of left forearm and right breast with open wound of right breast / Fungal dermatitis  - Patient with history of recurrent cellulitis. - We will continue clotrimazole - Continue doxycycline and Diflucan. - Blood cultures so far show no growth.   Active Problems: Lobar pneumonia (Community acquired pneumonia) - X-ray on the admission showed early infiltrates in the medial right lung base. - Patient was initially on azithromycin and Rocephin. This was subsequently discontinued considering her respiratory status was stable. - As noted, blood cultures so far show no growth. - Continue oxygen if needed to keep oxygen saturation above 90%.  Diarrhea - C. difficile antigen positive but toxin negative, likely colonized.  - Continue Florastor - Had 2 soft stools in past 24 hours.  BMI 70 and over, adult / Bedbound with functional quadriplegia - Evaluated and counseled by dietitian 03/01/15. - Bedbound for the past 3 years.  - Home health orders placed  Essential hypertension - Continue Bystolic  PAF (paroxysmal atrial fibrillation) - CHADSvasc score 3 - Rate controlled with Bystolic.  - Continue aspirin.  Depression - Stable. Continue Zoloft 50 mg daily.  Hyperkalemia -  Potassium normalized   Intertriginous dermatitis, incontinence associated dermatitis / Lower extremity cellulitis and venous stasis / open wound posterior LLE - Appreciate wound care assessment. There is a full-thickness tissue loss at the posterior left lower extremity and right anterior breast (chronic). Measurement: Right breast: 3cm x 8cm x 9.5AOZHYQ slick, red, moist, non-granulating wound bed. Left posterior LE: 2cm x 2.5cm x 0.2cm full thickness with red, moist non-granulating wound bed. Left breast inframammary skin fold, bilateral skin folds on arms (UEs), sub-pannicular skin fold, neck and inguinal skin folds and crease at gluteal cleft : intertriginous dermatitis: Red, macerated, serous drainage consistent with fungal overgrowth and excess moisture. - Dressing procedure/placement/frequency: Therapeutic mattress replacement with low air loss feature to accommodate bariatric proportion and to address microclimate. Topical wound care ordered for posterior left LE and right breast chronic wound. An antimicrobial textile is provided for the injtertriginous dermatitis in place of a topical antifungal cream. Bilateral pressure redistribution boots are provided to prevent pressure injuries to the heel.  Functional quadriplegia - Likely from morbid obesity    DVT Prophylaxis  - Lovenox subcutaneous ordered   Code Status: Full.  Family Communication:  plan of care discussed with the patient Disposition Plan: Home likely 03/07/2015.  IV access:  Peripheral IV  Procedures and diagnostic studies:    Dg Chest 2 View 02/27/2015   Changes suggestive of early infiltrate in the medial right lung base.   Electronically Signed   By: Inez Catalina M.D.   On: 02/27/2015 17:03   Medical Consultants:  None   Other Consultants:  Physical therapy Wound care  IAnti-Infectives:   Vancomycin 02/27/15 --> 03/05/15 Rocephin 02/27/15 --> 03/05/15 Azithromycin 02/27/15 --> 03/05/15  Clotrimazole 02/27/15  --> Doxycycline 03/05/15 -->   Leisa Lenz, MD  [facility] Pager [phone]  Time spent in minutes: 25 minutes  If 7PM-7AM, please contact night-coverage [email or web address] Password [email or web address] 03/06/2015, 12:30 PM   LOS: 7 days    HPI/Subjective: No acute overnight events. Patient reports feeling better.  Objective: Filed Vitals:   03/05/15 0604 03/05/15 1700 03/05/15 2151 03/06/15 0638  BP: 100/66 133/85 110/61 119/60  Pulse: 72 84 78 78  Temp: 98.2 F (36.8 C) 97.2 F (36.2 C) 98.1 F (36.7 C) 97.8 F (36.6 C)  TempSrc: Oral Oral Oral Oral  Resp: 18 20 20 20   Height:      Weight:      SpO2: 99% 100% 98% 95%    Intake/Output Summary (Last 24 hours) at 03/06/15 1230 Last data filed at 03/06/15 0800  Gross per 24 hour  Intake    480 ml  Output    652 ml  Net   -172 ml    Exam:   General:  Pt is alert, follows commands appropriately, not in acute distress  Cardiovascular: Regular rate and rhythm, S1/S2 (+)   Respiratory: Clear to auscultation bilaterally, no wheezing, no crackles, no rhonchi  Abdomen: Obese, nontender abdomen, appreciate bowel sounds  Extremities: left upper extremity erythematous, pulses palpable  Neuro: Grossly nonfocal  Data Reviewed: Basic Metabolic Panel:  Recent Labs Lab 02/27/15 1608 03/02/15 0531 03/04/15 0545 03/05/15 0635 03/06/15 0810  NA 134* 135 136 137  --   K 4.3 4.2 5.3* 4.4  --   CL 101 105 107 107  --   CO2 24 26 22 26   --   GLUCOSE 116* 87 93 99  --   BUN 15 14 8 7   --   CREATININE 1.17* 1.10* 0.95 0.86 0.90  CALCIUM 8.7* 8.4* 8.4* 8.6*  --    Liver Function Tests:  Recent Labs Lab 02/27/15 1608  AST 19  ALT 9*  ALKPHOS 71  BILITOT 1.2  PROT 7.8  ALBUMIN 2.9*   No results for input(s): LIPASE, AMYLASE in the last 168 hours. No results for input(s): AMMONIA in the last 168 hours. CBC:  Recent Labs Lab 02/27/15 1608 03/05/15 0635  WBC 12.2* 8.5  NEUTROABS 10.7*  --   HGB 12.3 12.2  HCT  38.2 38.9  MCV 98.2 101.0*  PLT 186 185   Cardiac Enzymes: No results for input(s): CKTOTAL, CKMB, CKMBINDEX, TROPONINI in the last 168 hours. BNP: Invalid input(s): POCBNP CBG: No results for input(s): GLUCAP in the last 168 hours.  Recent Results (from the past 240 hour(s))  Culture, blood (routine x 2)     Status: None   Collection Time: 02/27/15  4:05 PM  Result Value Ref Range Status   Specimen Description BLOOD BLOOD LEFT HAND  Final   Special Requests BOTTLES DRAWN AEROBIC AND ANAEROBIC 5CC  Final   Culture   Final    NO GROWTH 5 DAYS Performed at [facility]    Report Status 03/04/2015 FINAL  Final  Culture, blood (routine x 2)     Status: None   Collection Time: 02/27/15  6:50 PM  Result Value Ref Range Status   Specimen Description BLOOD RIGHT HAND  Final   Special Requests IN PEDIATRIC BOTTLE 3CC  Final   Culture   Final    NO GROWTH 5 DAYS Performed at [facility]    Report Status 03/04/2015 FINAL  Final  Urine culture  Status: None   Collection Time: 02/27/15  8:52 PM  Result Value Ref Range Status   Specimen Description Urine  Final   Special Requests NONE  Final   Culture   Final    NO GROWTH 1 DAY Performed at [facility]    Report Status 03/01/2015 FINAL  Final  C difficile quick scan w PCR reflex     Status: Abnormal   Collection Time: 03/04/15 11:39 AM  Result Value Ref Range Status   C Diff antigen POSITIVE (A) NEGATIVE Final   C Diff toxin NEGATIVE NEGATIVE Final   C Diff interpretation   Final    C. difficile present, but toxin not detected. This indicates colonization. In most cases, this does not require treatment. If patient has signs and symptoms consistent with colitis, consider treatment.     Scheduled Meds: . aspirin EC  81 mg Oral Daily  . doxycycline  100 mg Oral Q12H  . enoxaparin (LOVENOX) injection  90 mg Subcutaneous QHS  . fluconazole  200 mg Oral Daily  . nebivolol  10 mg Oral Daily  .  pantoprazole  40 mg Oral Daily  . saccharomyces boulardii  250 mg Oral BID  . sertraline  50 mg Oral Daily  . traZODone  100 mg Oral QHS

## 2015-03-07 MED ORDER — SACCHAROMYCES BOULARDII 250 MG PO CAPS: 250.0000 mg | ORAL_CAPSULE | Freq: Two times a day (BID) | ORAL | Status: AC

## 2015-03-07 MED ORDER — FAMOTIDINE 20 MG PO TABS: 20.0000 mg | ORAL_TABLET | Freq: Two times a day (BID) | ORAL | Status: AC

## 2015-03-07 MED ORDER — ALBUTEROL SULFATE (2.5 MG/3ML) 0.083% IN NEBU: 2.5000 mg | INHALATION_SOLUTION | Freq: Four times a day (QID) | RESPIRATORY_TRACT | Status: AC | PRN

## 2015-03-07 MED ORDER — DOXYCYCLINE HYCLATE 100 MG PO TABS: 100.0000 mg | ORAL_TABLET | Freq: Two times a day (BID) | ORAL | Status: DC

## 2015-03-07 MED ORDER — FLUCONAZOLE 200 MG PO TABS: 200.0000 mg | ORAL_TABLET | Freq: Every day | ORAL | Status: DC

## 2015-03-07 NOTE — Care Management Important Message (Signed)
Important Message  Patient Details IM Letter given to Rhonda/ Case Manager to deliver to [place] Message  Patient Details  Name: [name] MRN: [id number] Date of Birth: Jun 16, 1951   Medicare Important Message Given:  Yes-fourth notification given    Camillo Flaming 03/07/2015, 12:59 PM Name: [name] MRN: [id number] Date of Birth: [birth year]   Medicare Important Message Given:  Yes-fourth notification given    Camillo Flaming 03/07/2015, 12:57 PM

## 2015-03-07 NOTE — Progress Notes (Signed)
Clinical Social Work  Therapist, sports reports that patient needs transportation home. CSW met with patient at bedside who confirmed her address and agreeable to [facility]. Patient has equipment at home and keys to get into house. PTAR arranged. Request #: [id number].  CSW is signing off but available if needed.  [place], [place] [phone]

## 2015-03-07 NOTE — Discharge Instructions (Signed)
Abscess An abscess (boil or furuncle) is an infected area on or under the skin. This area is filled with yellowish-white fluid (pus) and other material (debris). HOME CARE   Only take medicines as told by your doctor.  If you were given antibiotic medicine, take it as directed. Finish the medicine even if you start to feel better.  If gauze is used, follow your doctor's directions for changing the gauze.  To avoid spreading the infection:  Keep your abscess covered with a bandage.  Wash your hands well.  Do not share personal care items, towels, or whirlpools with others.  Avoid skin contact with others.  Keep your skin and clothes clean around the abscess.  Keep all doctor visits as told. GET HELP RIGHT AWAY IF:   You have more pain, puffiness (swelling), or redness in the wound site.  You have more fluid or blood coming from the wound site.  You have muscle aches, chills, or you feel sick.  You have a fever. MAKE SURE YOU:   Understand these instructions.  Will watch your condition.  Will get help right away if you are not doing well or get worse. Document Released: 11/25/2007 Document Revised: 12/08/2011 Document Reviewed: 08/21/2011 [facility] Patient Information 2015 [place], [place]. This information is not intended to replace advice given to you by your health care provider. Make sure you discuss any questions you have with your health care provider. Doxycycline tablets or capsules What is this medicine? DOXYCYCLINE (dox i SYE kleen) is a tetracycline antibiotic. It kills certain bacteria or stops their growth. It is used to treat many kinds of infections, like dental, skin, respiratory, and urinary tract infections. It also treats acne, Lyme disease, malaria, and certain sexually transmitted infections. This medicine may be used for other purposes; ask your health care provider or pharmacist if you have questions. COMMON BRAND NAME(S): Acticlate, Adoxa, Adoxa CK,  Adoxa Pak, Adoxa TT, Alodox, Avidoxy, Doxal, Monodox, Morgidox 1x, Morgidox 1x Kit, Morgidox 2x, Morgidox 2x Kit, Ocudox, Vibra-Tabs, Vibramycin What should I tell my health care provider before I take this medicine? They need to know if you have any of these conditions: -liver disease -long exposure to sunlight like working outdoors -stomach problems like colitis -an unusual or allergic reaction to doxycycline, tetracycline antibiotics, other medicines, foods, dyes, or preservatives -pregnant or trying to get pregnant -breast-feeding How should I use this medicine? Take this medicine by mouth with a full glass of water. Follow the directions on the prescription label. It is best to take this medicine without food, but if it upsets your stomach take it with food. Take your medicine at regular intervals. Do not take your medicine more often than directed. Take all of your medicine as directed even if you think you are better. Do not skip doses or stop your medicine early. Talk to your pediatrician regarding the use of this medicine in children. Special care may be needed. While this drug may be prescribed for children as young as 45 years old for selected conditions, precautions do apply. Overdosage: If you think you have taken too much of this medicine contact a poison control center or emergency room at once. NOTE: This medicine is only for you. Do not share this medicine with others. What if I miss a dose? If you miss a dose, take it as soon as you can. If it is almost time for your next dose, take only that dose. Do not take double or extra doses. What may  interact with this medicine? -antacids -barbiturates -birth control pills -bismuth subsalicylate -carbamazepine -methoxyflurane -other antibiotics -phenytoin -vitamins that contain iron -warfarin This list may not describe all possible interactions. Give your health care provider a list of all the medicines, herbs, non-prescription  drugs, or dietary supplements you use. Also tell them if you smoke, drink alcohol, or use illegal drugs. Some items may interact with your medicine. What should I watch for while using this medicine? Tell your doctor or health care professional if your symptoms do not improve. Do not treat diarrhea with over the counter products. Contact your doctor if you have diarrhea that lasts more than 2 days or if it is severe and watery. Do not take this medicine just before going to bed. It may not dissolve properly when you lay down and can cause pain in your throat. Drink plenty of fluids while taking this medicine to also help reduce irritation in your throat. This medicine can make you more sensitive to the sun. Keep out of the sun. If you cannot avoid being in the sun, wear protective clothing and use sunscreen. Do not use sun lamps or tanning beds/booths. Birth control pills may not work properly while you are taking this medicine. Talk to your doctor about using an extra method of birth control. If you are being treated for a sexually transmitted infection, avoid sexual contact until you have finished your treatment. Your sexual partner may also need treatment. Avoid antacids, aluminum, calcium, magnesium, and iron products for 4 hours before and 2 hours after taking a dose of this medicine. If you are using this medicine to prevent malaria, you should still protect yourself from contact with mosquitos. Stay in screened-in areas, use mosquito nets, keep your body covered, and use an insect repellent. What side effects may I notice from receiving this medicine? Side effects that you should report to your doctor or health care professional as soon as possible: -allergic reactions like skin rash, itching or hives, swelling of the face, lips, or tongue -difficulty breathing -fever -itching in the rectal or genital area -pain on swallowing -redness, blistering, peeling or loosening of the skin, including  inside the mouth -severe stomach pain or cramps -unusual bleeding or bruising -unusually weak or tired -yellowing of the eyes or skin Side effects that usually do not require medical attention (report to your doctor or health care professional if they continue or are bothersome): -diarrhea -loss of appetite -nausea, vomiting This list may not describe all possible side effects. Call your doctor for medical advice about side effects. You may report side effects to FDA at 1-800-FDA-1088. Where should I keep my medicine? Keep out of the reach of children. Store at room temperature, below 30 degrees C (86 degrees F). Protect from light. Keep container tightly closed. Throw away any unused medicine after the expiration date. Taking this medicine after the expiration date can make you seriously ill. NOTE: This sheet is a summary. It may not cover all possible information. If you have questions about this medicine, talk to your doctor, pharmacist, or health care provider.  2015, Elsevier/Gold Standard. (2013-04-14 13:58:06) Fluconazole tablets What is this medicine? FLUCONAZOLE (floo KON na zole) is an antifungal medicine. It is used to treat certain kinds of fungal or yeast infections. This medicine may be used for other purposes; ask your health care provider or pharmacist if you have questions. COMMON BRAND NAME(S): Diflucan What should I tell my health care provider before I take this medicine? They need  to know if you have any of these conditions: -electrolyte abnormalities -history of irregular heart beat -kidney disease -an unusual or allergic reaction to fluconazole, other azole antifungals, medicines, foods, dyes, or preservatives -pregnant or trying to get pregnant -breast-feeding How should I use this medicine? Take this medicine by mouth. Follow the directions on the prescription label. Do not take your medicine more often than directed. Talk to your pediatrician regarding the use  of this medicine in children. Special care may be needed. This medicine has been used in children as young as 62 months of age. Overdosage: If you think you have taken too much of this medicine contact a poison control center or emergency room at once. NOTE: This medicine is only for you. Do not share this medicine with others. What if I miss a dose? If you miss a dose, take it as soon as you can. If it is almost time for your next dose, take only that dose. Do not take double or extra doses. What may interact with this medicine? Do not take this medicine with any of the following medications: -astemizole -certain medicines for irregular heart beat like dofetilide, dronedarone, quinidine -cisapride -erythromycin -lomitapide -other medicines that prolong the QT interval (cause an abnormal heart rhythm) -pimozide -terfenadine -thioridazine -tolvaptan -ziprasidone This medicine may also interact with the following medications: -antiviral medicines for HIV or AIDS -birth control pills -certain antibiotics like rifabutin, rifampin -certain medicines for blood pressure like amlodipine, isradipine, felodipine, hydrochlorothiazide, losartan, nifedipine -certain medicines for cancer like cyclophosphamide, vinblastine, vincristine -certain medicines for cholesterol like atorvastatin, lovastatin, fluvastatin, simvastatin -certain medicines for depression, anxiety, or psychotic disturbances like amitriptyline, midazolam, nortriptyline, triazolam -certain medicines for diabetes like glipizide, glyburide, tolbutamide -certain medicines for pain like alfentanil, fentanyl, methadone -certain medicines for seizures like carbamazepine, phenytoin -certain medicines that treat or prevent blood clots like warfarin -halofantrine -medicines that lower your chance of fighting infection like cyclosporine, prednisone, tacrolimus -NSAIDS, medicines for pain and inflammation, like celecoxib, diclofenac,  flurbiprofen, ibuprofen, meloxicam, naproxen -other medicines for fungal infections -sirolimus -theophylline -tofacitinib This list may not describe all possible interactions. Give your health care provider a list of all the medicines, herbs, non-prescription drugs, or dietary supplements you use. Also tell them if you smoke, drink alcohol, or use illegal drugs. Some items may interact with your medicine. What should I watch for while using this medicine? Visit your doctor or health care professional for regular checkups. If you are taking this medicine for a long time you may need blood work. Tell your doctor if your symptoms do not improve. Some fungal infections need many weeks or months of treatment to cure. Alcohol can increase possible damage to your liver. Avoid alcoholic drinks. If you have a vaginal infection, do not have sex until you have finished your treatment. You can wear a sanitary napkin. Do not use tampons. Wear freshly washed cotton, not synthetic, panties. What side effects may I notice from receiving this medicine? Side effects that you should report to your doctor or health care professional as soon as possible: -allergic reactions like skin rash or itching, hives, swelling of the lips, mouth, tongue, or throat -dark urine -feeling dizzy or faint -irregular heartbeat or chest pain -redness, blistering, peeling or loosening of the skin, including inside the mouth -trouble breathing -unusual bruising or bleeding -vomiting -yellowing of the eyes or skin Side effects that usually do not require medical attention (report to your doctor or health care professional if they continue or are  bothersome): -changes in how food tastes -diarrhea -headache -stomach upset or nausea This list may not describe all possible side effects. Call your doctor for medical advice about side effects. You may report side effects to FDA at 1-800-FDA-1088. Where should I keep my medicine? Keep  out of the reach of children. Store at room temperature below 30 degrees C (86 degrees F). Throw away any medicine after the expiration date. NOTE: This sheet is a summary. It may not cover all possible information. If you have questions about this medicine, talk to your doctor, pharmacist, or health care provider.  2015, Elsevier/Gold Standard. (2013-01-14 16:13:04)

## 2015-03-07 NOTE — Progress Notes (Signed)
DC summary sent to [facility] to be forwarded to payor

## 2015-03-07 NOTE — Progress Notes (Signed)
[facility]/Shemia Bevel,RN,BSN,CCM

## 2015-03-07 NOTE — Discharge Summary (Addendum)
Physician Discharge Summary  [name] OHY:[id number] DOB: 1950/09/10 DOA: 02/27/2015  PCP: Alvester Chou, NP  Admit date: 02/27/2015 Discharge date: 03/07/2015  Recommendations for Outpatient Follow-up:  1. Continue doxycycline for 4 days on discharge. Continue fluconazole for 5 days on discharge. 2. Please note that the C. difficile antigen is positive but C. difficile toxin is negative. We changed from Protonix to Pepcid on discharge. Also the goal is to minimize the antibodies as this patient is at risk for CDiff infection. Will not continue doxycycline for more than 4 days of discharge. Patient instructed to follow-up with primary care physician and wound care to reevaluate the wound healing.  Discharge Diagnoses:  Principal Problem:   Cellulitis of left forearm Active Problems:   Cellulitis of female breast   BMI 70 and over, adult   HTN (hypertension)   Stasis dermatitis   PAF (paroxysmal atrial fibrillation)   CAP (community acquired pneumonia)   Lymphedema   Functional quadriplegia   Lobar pneumonia   Lobar pneumonia due to unspecified organism   Hyperkalemia   Dermatitis fungal   Non-infective diarrhea   Depression    Discharge Condition: stable   Diet recommendation: as tolerated   History of present illness:  64 y.o. female with a past medical history of hypertension, depression/anxiety, breast cancer status post right lumpectomy, PAF, prior DVT, morbid obesity, OSA and recurrent cellulitis of the extremities who was admitted with fever, left forearm swelling and erythema consistent with recurrent cellulitis. Patient was seen by wound care team.  Hospital Course:  Assessment/Plan:    Principal Problems: Cellulitis of left forearm and right breast with open wound of right breast / Fungal dermatitis  - Patient with history of recurrent cellulitis. - As noted above, we will continue doxycycline for 4 days on discharge. We will continue Diflucan for 5 days on  discharge. - Blood cultures to date are negative.  Active Problems: Lobar pneumonia (Community acquired pneumonia) - Chest x-ray at the time of the admission demonstrated early infiltrates in the medial right lung base. - Patient initially on azithromycin and Rocephin. This was subsequently discontinued considering stable respiratory status  Diarrhea - C. difficile antigen positive but toxin negative, likely colonized.  - Continue Florastor - No diarrhea in past 48 hours   BMI 70 and over, adult / Bed bound / functional quadriplegia - Bedbound for the past 3 years.  - Home health orders placed prior to discharge   Essential hypertension - Continue Bystolic on discharge   PAF (paroxysmal atrial fibrillation) - CHADSvasc score 3 - Rate controlled with Bystolic.  - Continue daily aspirin.  Depression - Continue Zoloft 50 mg daily.  Hyperkalemia - Potassium normalized   Intertriginous dermatitis, incontinence associated dermatitis / Lower extremity cellulitis and venous stasis / open wound posterior LLE / Venous stasis dermatitis  - Appreciate wound care assessment. There is a full-thickness tissue loss at the posterior left lower extremity and right anterior breast (chronic). Measurement: Right breast: 3cm x 8cm x 9.4WNIOEV slick, red, moist, non-granulating wound bed. Left posterior LE: 2cm x 2.5cm x 0.2cm full thickness with red, moist non-granulating wound bed. Left breast inframammary skin fold, bilateral skin folds on arms (UEs), sub-pannicular skin fold, neck and inguinal skin folds and crease at gluteal cleft : intertriginous dermatitis: Red, macerated, serous drainage consistent with fungal overgrowth and excess moisture. - Dressing procedure/placement/frequency: Therapeutic mattress replacement with low air loss feature to accommodate bariatric proportion and to address microclimate. Topical wound care ordered for posterior left  LE and right breast chronic wound. An  antimicrobial textile is provided for the injtertriginous dermatitis in place of a topical antifungal cream. Bilateral pressure redistribution boots are provided to prevent pressure injuries to the heel.    DVT Prophylaxis  - Lovenox subcutaneous ordered while pt in hospital    Code Status: Full.  Family Communication: plan of care discussed with the patient   IV access:  Peripheral IV  Procedures and diagnostic studies:   Dg Chest 2 View 02/27/2015 Changes suggestive of early infiltrate in the medial right lung base. Electronically Signed By: Inez Catalina M.D. On: 02/27/2015 17:03   Medical Consultants:  None   Other Consultants:  Physical therapy Wound care  IAnti-Infectives:   Vancomycin 02/27/15 --> 03/05/15 Rocephin 02/27/15 --> 03/05/15 Azithromycin 02/27/15 --> 03/05/15 Clotrimazole 02/27/15 --> 03/06/2015 Doxycycline 03/05/15 --> for 4 days on discharge     Signed:  Leisa Lenz, MD  [facility] 03/07/2015, 10:17 AM  Pager #: [phone]  Time spent in minutes: more than 30 minutes   Discharge Exam: Filed Vitals:   03/07/15 0516  BP: 134/68  Pulse: 72  Temp: 97.5 F (36.4 C)  Resp: 20   Filed Vitals:   03/06/15 0638 03/06/15 1354 03/06/15 2052 03/07/15 0516  BP: 119/60 130/59 127/73 134/68  Pulse: 78 90 72 72  Temp: 97.8 F (36.6 C) 98 F (36.7 C) 97.8 F (36.6 C) 97.5 F (36.4 C)  TempSrc: Oral Oral Oral Oral  Resp: 20 20 20 20   Height:      Weight:      SpO2: 95% 96% 93% 95%    General: Pt is alert, not in acute distress Cardiovascular: Regular rate and rhythm, S1/S2 + Respiratory: No wheezing, no crackles, no rhonchi Abdominal: Soft, obese, non tender, non distended, bowel sounds +, no guarding Extremities: bilateral obese extremities with venous stasis changes, cellulitis improving  Neuro: Nonfocal  Discharge Instructions  Discharge Instructions    Call MD for:  difficulty breathing, headache or visual  disturbances    Complete by:  As directed      Call MD for:  persistant nausea and vomiting    Complete by:  As directed      Call MD for:  severe uncontrolled pain    Complete by:  As directed      Diet - low sodium heart healthy    Complete by:  As directed      Discharge instructions    Complete by:  As directed   1. Continue doxycycline for 4 days on discharge. Continue fluconazole for 5 days on discharge.     Increase activity slowly    Complete by:  As directed             Medication List    TAKE these medications        aspirin EC 81 MG tablet  Take 1 tablet (81 mg total) by mouth daily.     diphenhydrAMINE 25 mg capsule  Commonly known as:  BENADRYL  Take 1 capsule (25 mg total) by mouth every 6 (six) hours as needed for allergies.     doxycycline 100 MG tablet  Commonly known as:  VIBRA-TABS  Take 1 tablet (100 mg total) by mouth every 12 (twelve) hours.     famotidine 20 MG tablet  Commonly known as:  PEPCID  Take 1 tablet (20 mg total) by mouth 2 (two) times daily.     fluconazole 200 MG tablet  Commonly known as:  DIFLUCAN  Take 1 tablet (200 mg total) by mouth daily.     gabapentin 300 MG capsule  Commonly known as:  NEURONTIN  Take 1 capsule (300 mg total) by mouth at bedtime as needed (nerve pain).     loperamide 2 MG tablet  Commonly known as:  IMODIUM A-D  Take 2 mg by mouth 4 (four) times daily as needed for diarrhea or loose stools.     LORazepam 0.5 MG tablet  Commonly known as:  ATIVAN  Take 0.25 mg by mouth every 12 (twelve) hours as needed for anxiety.     nebivolol 10 MG tablet  Commonly known as:  BYSTOLIC  Take 10 mg by mouth daily.     saccharomyces boulardii 250 MG capsule  Commonly known as:  FLORASTOR  Take 1 capsule (250 mg total) by mouth 2 (two) times daily.     sertraline 50 MG tablet  Commonly known as:  ZOLOFT  TK 1 T PO  QHS.     traZODone 100 MG tablet  Commonly known as:  DESYREL  TK 1 T PO  HS.     Vitamin D-3  5000 UNITS Tabs  Take 1 capsule by mouth daily.      ASK your doctor about these medications        albuterol 108 (90 BASE) MCG/ACT inhaler  Commonly known as:  PROVENTIL HFA;VENTOLIN HFA  Inhale 1 puff into the lungs 4 (four) times daily.  Ask about: Which instructions should I use?     albuterol (2.5 MG/3ML) 0.083% nebulizer solution  Commonly known as:  PROVENTIL  Take 3 mLs (2.5 mg total) by nebulization every 6 (six) hours as needed for wheezing or shortness of breath.  Ask about: Which instructions should I use?           Follow-up Information    Follow up with Alvester Chou, NP. Schedule an appointment as soon as possible for a visit in 1 week.   Specialty:  Nurse Practitioner   Why:  Follow up appt after recent hospitalization   Contact information:   [facility] [place]  [id number] [phone]        The results of significant diagnostics from this hospitalization (including imaging, microbiology, ancillary and laboratory) are listed below for reference.    Significant Diagnostic Studies: Dg Chest 2 View  02/27/2015   CLINICAL DATA:  Persistent cough for several months  EXAM: CHEST - 2 VIEW  COMPARISON:  03/08/2013  FINDINGS: Cardiac shadow is within normal limits. Increased density is noted along the medial aspect of the right lung base. A portion of this is related to the patient's body habitus although an early infiltrate cannot be excluded. The lateral images are of little utility due to the patient's body habitus.  IMPRESSION: Changes suggestive of early infiltrate in the medial right lung base.   Electronically Signed   By: Inez Catalina M.D.   On: 02/27/2015 17:03    Microbiology: Recent Results (from the past 240 hour(s))  Culture, blood (routine x 2)     Status: None   Collection Time: 02/27/15  4:05 PM  Result Value Ref Range Status   Specimen Description BLOOD BLOOD LEFT HAND  Final   Special Requests BOTTLES  DRAWN AEROBIC AND ANAEROBIC 5CC  Final   Culture   Final    NO GROWTH 5 DAYS Performed at [facility]    Report Status 03/04/2015 FINAL  Final  Culture, blood (routine x 2)     Status: None   Collection Time: 02/27/15  6:50 PM  Result Value Ref Range Status   Specimen Description BLOOD RIGHT HAND  Final   Special Requests IN PEDIATRIC BOTTLE 3CC  Final   Culture   Final    NO GROWTH 5 DAYS Performed at [facility]    Report Status 03/04/2015 FINAL  Final  Urine culture     Status: None   Collection Time: 02/27/15  8:52 PM  Result Value Ref Range Status   Specimen Description Urine  Final   Special Requests NONE  Final   Culture   Final    NO GROWTH 1 DAY Performed at [facility]    Report Status 03/01/2015 FINAL  Final  C difficile quick scan w PCR reflex     Status: Abnormal   Collection Time: 03/04/15 11:39 AM  Result Value Ref Range Status   C Diff antigen POSITIVE (A) NEGATIVE Final   C Diff toxin NEGATIVE NEGATIVE Final   C Diff interpretation   Final    C. difficile present, but toxin not detected. This indicates colonization. In most cases, this does not require treatment. If patient has signs and symptoms consistent with colitis, consider treatment.     Labs: Basic Metabolic Panel:  Recent Labs Lab 03/02/15 0531 03/04/15 0545 03/05/15 0635 03/06/15 0810  NA 135 136 137  --   K 4.2 5.3* 4.4  --   CL 105 107 107  --   CO2 26 22 26   --   GLUCOSE 87 93 99  --   BUN 14 8 7   --   CREATININE 1.10* 0.95 0.86 0.90  CALCIUM 8.4* 8.4* 8.6*  --    Liver Function Tests: No results for input(s): AST, ALT, ALKPHOS, BILITOT, PROT, ALBUMIN in the last 168 hours. No results for input(s): LIPASE, AMYLASE in the last 168 hours. No results for input(s): AMMONIA in the last 168 hours. CBC:  Recent Labs Lab 03/05/15 0635  WBC 8.5  HGB 12.2  HCT 38.9  MCV 101.0*  PLT 185   Cardiac Enzymes: No results for input(s): CKTOTAL, CKMB, CKMBINDEX,  TROPONINI in the last 168 hours. BNP: BNP (last 3 results) No results for input(s): BNP in the last 8760 hours.  ProBNP (last 3 results) No results for input(s): PROBNP in the last 8760 hours.  CBG: No results for input(s): GLUCAP in the last 168 hours.

## 2015-03-07 NOTE — Progress Notes (Addendum)
Patient given discharge instructions, and verbalized an understanding of all discharge instructions.  Patient agrees with discharge plan, and is being discharged in stable medical condition.  Patient given transportation via ptar.

## 2015-03-08 ENCOUNTER — Inpatient Hospital Stay (HOSPITAL_COMMUNITY)
Admission: EM | Admit: 2015-03-08 | Discharge: 2015-03-16 | DRG: 193 | Disposition: A | Discharge status: Home Health | Payer: Medicare Other | Attending: Internal Medicine | Admitting: Internal Medicine

## 2015-03-08 DIAGNOSIS — R0602 Shortness of breath: Secondary | ICD-10-CM | POA: Diagnosis not present

## 2015-03-08 DIAGNOSIS — Z86718 Personal history of other venous thrombosis and embolism: Secondary | ICD-10-CM

## 2015-03-08 DIAGNOSIS — Z79899 Other long term (current) drug therapy: Secondary | ICD-10-CM

## 2015-03-08 DIAGNOSIS — L03114 Cellulitis of left upper limb: Secondary | ICD-10-CM | POA: Diagnosis present

## 2015-03-08 DIAGNOSIS — G4733 Obstructive sleep apnea (adult) (pediatric): Secondary | ICD-10-CM | POA: Diagnosis present

## 2015-03-08 DIAGNOSIS — F329 Major depressive disorder, single episode, unspecified: Secondary | ICD-10-CM | POA: Diagnosis present

## 2015-03-08 DIAGNOSIS — Z7401 Bed confinement status: Secondary | ICD-10-CM

## 2015-03-08 DIAGNOSIS — I5033 Acute on chronic diastolic (congestive) heart failure: Secondary | ICD-10-CM | POA: Diagnosis present

## 2015-03-08 DIAGNOSIS — Z66 Do not resuscitate: Secondary | ICD-10-CM | POA: Diagnosis present

## 2015-03-08 DIAGNOSIS — L8990 Pressure ulcer of unspecified site, unspecified stage: Secondary | ICD-10-CM | POA: Insufficient documentation

## 2015-03-08 DIAGNOSIS — I509 Heart failure, unspecified: Secondary | ICD-10-CM

## 2015-03-08 DIAGNOSIS — Z6841 Body Mass Index (BMI) 40.0 and over, adult: Secondary | ICD-10-CM

## 2015-03-08 DIAGNOSIS — E876 Hypokalemia: Secondary | ICD-10-CM | POA: Diagnosis present

## 2015-03-08 DIAGNOSIS — J189 Pneumonia, unspecified organism: Principal | ICD-10-CM | POA: Diagnosis present

## 2015-03-08 DIAGNOSIS — I272 Other secondary pulmonary hypertension: Secondary | ICD-10-CM | POA: Diagnosis present

## 2015-03-08 DIAGNOSIS — J42 Unspecified chronic bronchitis: Secondary | ICD-10-CM | POA: Diagnosis present

## 2015-03-08 DIAGNOSIS — Z7982 Long term (current) use of aspirin: Secondary | ICD-10-CM

## 2015-03-08 DIAGNOSIS — I890 Lymphedema, not elsewhere classified: Secondary | ICD-10-CM

## 2015-03-08 DIAGNOSIS — Z8701 Personal history of pneumonia (recurrent): Secondary | ICD-10-CM

## 2015-03-08 DIAGNOSIS — F419 Anxiety disorder, unspecified: Secondary | ICD-10-CM | POA: Diagnosis present

## 2015-03-08 DIAGNOSIS — Z87891 Personal history of nicotine dependence: Secondary | ICD-10-CM

## 2015-03-08 DIAGNOSIS — E662 Morbid (severe) obesity with alveolar hypoventilation: Secondary | ICD-10-CM | POA: Diagnosis present

## 2015-03-08 DIAGNOSIS — Z90710 Acquired absence of both cervix and uterus: Secondary | ICD-10-CM

## 2015-03-08 DIAGNOSIS — I501 Left ventricular failure: Secondary | ICD-10-CM | POA: Diagnosis present

## 2015-03-08 DIAGNOSIS — R0902 Hypoxemia: Secondary | ICD-10-CM | POA: Diagnosis present

## 2015-03-08 DIAGNOSIS — I480 Paroxysmal atrial fibrillation: Secondary | ICD-10-CM | POA: Diagnosis present

## 2015-03-08 DIAGNOSIS — Z853 Personal history of malignant neoplasm of breast: Secondary | ICD-10-CM

## 2015-03-08 DIAGNOSIS — Z888 Allergy status to other drugs, medicaments and biological substances status: Secondary | ICD-10-CM

## 2015-03-08 DIAGNOSIS — L89892 Pressure ulcer of other site, stage 2: Secondary | ICD-10-CM | POA: Diagnosis present

## 2015-03-08 DIAGNOSIS — Z8249 Family history of ischemic heart disease and other diseases of the circulatory system: Secondary | ICD-10-CM

## 2015-03-08 DIAGNOSIS — M1990 Unspecified osteoarthritis, unspecified site: Secondary | ICD-10-CM | POA: Diagnosis present

## 2015-03-08 DIAGNOSIS — I872 Venous insufficiency (chronic) (peripheral): Secondary | ICD-10-CM | POA: Diagnosis present

## 2015-03-08 DIAGNOSIS — Z9989 Dependence on other enabling machines and devices: Secondary | ICD-10-CM

## 2015-03-08 DIAGNOSIS — R55 Syncope and collapse: Secondary | ICD-10-CM | POA: Diagnosis present

## 2015-03-08 DIAGNOSIS — I10 Essential (primary) hypertension: Secondary | ICD-10-CM | POA: Diagnosis present

## 2015-03-08 NOTE — ED Notes (Signed)
EMS called to home d/t near syncopal episodes x2 intermittent Afib on monitor pt is morbidly obesity VSS 141/66  CBG 112 HR 84 irreg sat 85-86 on room air improved low 90"s on 2 liters

## 2015-03-08 NOTE — ED Notes (Signed)
Bed: HW86 Expected date:  Expected time:  Means of arrival:  Comments: EMS/46F/near syncope

## 2015-03-09 ENCOUNTER — Emergency Department (HOSPITAL_COMMUNITY): Payer: Medicare Other

## 2015-03-09 ENCOUNTER — Inpatient Hospital Stay (HOSPITAL_COMMUNITY): Payer: Medicare Other

## 2015-03-09 ENCOUNTER — Encounter (HOSPITAL_COMMUNITY): Payer: Self-pay | Admitting: *Deleted

## 2015-03-09 DIAGNOSIS — I5033 Acute on chronic diastolic (congestive) heart failure: Secondary | ICD-10-CM | POA: Diagnosis present

## 2015-03-09 DIAGNOSIS — Z6841 Body Mass Index (BMI) 40.0 and over, adult: Secondary | ICD-10-CM | POA: Diagnosis not present

## 2015-03-09 DIAGNOSIS — I5031 Acute diastolic (congestive) heart failure: Secondary | ICD-10-CM | POA: Diagnosis not present

## 2015-03-09 DIAGNOSIS — I509 Heart failure, unspecified: Secondary | ICD-10-CM

## 2015-03-09 DIAGNOSIS — L03114 Cellulitis of left upper limb: Secondary | ICD-10-CM

## 2015-03-09 DIAGNOSIS — F329 Major depressive disorder, single episode, unspecified: Secondary | ICD-10-CM | POA: Diagnosis present

## 2015-03-09 DIAGNOSIS — E876 Hypokalemia: Secondary | ICD-10-CM | POA: Diagnosis present

## 2015-03-09 DIAGNOSIS — I8312 Varicose veins of left lower extremity with inflammation: Secondary | ICD-10-CM

## 2015-03-09 DIAGNOSIS — I480 Paroxysmal atrial fibrillation: Secondary | ICD-10-CM | POA: Diagnosis present

## 2015-03-09 DIAGNOSIS — J42 Unspecified chronic bronchitis: Secondary | ICD-10-CM | POA: Diagnosis present

## 2015-03-09 DIAGNOSIS — R55 Syncope and collapse: Secondary | ICD-10-CM | POA: Diagnosis present

## 2015-03-09 DIAGNOSIS — R0602 Shortness of breath: Secondary | ICD-10-CM | POA: Diagnosis present

## 2015-03-09 DIAGNOSIS — R0902 Hypoxemia: Secondary | ICD-10-CM | POA: Diagnosis present

## 2015-03-09 DIAGNOSIS — I10 Essential (primary) hypertension: Secondary | ICD-10-CM | POA: Diagnosis present

## 2015-03-09 DIAGNOSIS — Z7982 Long term (current) use of aspirin: Secondary | ICD-10-CM | POA: Diagnosis not present

## 2015-03-09 DIAGNOSIS — I890 Lymphedema, not elsewhere classified: Secondary | ICD-10-CM | POA: Diagnosis present

## 2015-03-09 DIAGNOSIS — Z7401 Bed confinement status: Secondary | ICD-10-CM | POA: Diagnosis not present

## 2015-03-09 DIAGNOSIS — Z90710 Acquired absence of both cervix and uterus: Secondary | ICD-10-CM | POA: Diagnosis not present

## 2015-03-09 DIAGNOSIS — M1990 Unspecified osteoarthritis, unspecified site: Secondary | ICD-10-CM | POA: Diagnosis present

## 2015-03-09 DIAGNOSIS — Z87891 Personal history of nicotine dependence: Secondary | ICD-10-CM | POA: Diagnosis not present

## 2015-03-09 DIAGNOSIS — Z853 Personal history of malignant neoplasm of breast: Secondary | ICD-10-CM | POA: Diagnosis not present

## 2015-03-09 DIAGNOSIS — Z8249 Family history of ischemic heart disease and other diseases of the circulatory system: Secondary | ICD-10-CM | POA: Diagnosis not present

## 2015-03-09 DIAGNOSIS — R0600 Dyspnea, unspecified: Secondary | ICD-10-CM | POA: Diagnosis not present

## 2015-03-09 DIAGNOSIS — F419 Anxiety disorder, unspecified: Secondary | ICD-10-CM | POA: Diagnosis present

## 2015-03-09 DIAGNOSIS — I272 Other secondary pulmonary hypertension: Secondary | ICD-10-CM | POA: Diagnosis present

## 2015-03-09 DIAGNOSIS — I8311 Varicose veins of right lower extremity with inflammation: Secondary | ICD-10-CM

## 2015-03-09 DIAGNOSIS — Z79899 Other long term (current) drug therapy: Secondary | ICD-10-CM | POA: Diagnosis not present

## 2015-03-09 DIAGNOSIS — Z86718 Personal history of other venous thrombosis and embolism: Secondary | ICD-10-CM | POA: Diagnosis not present

## 2015-03-09 DIAGNOSIS — J189 Pneumonia, unspecified organism: Secondary | ICD-10-CM | POA: Diagnosis present

## 2015-03-09 DIAGNOSIS — I872 Venous insufficiency (chronic) (peripheral): Secondary | ICD-10-CM | POA: Diagnosis present

## 2015-03-09 DIAGNOSIS — Z8701 Personal history of pneumonia (recurrent): Secondary | ICD-10-CM | POA: Diagnosis not present

## 2015-03-09 DIAGNOSIS — L89892 Pressure ulcer of other site, stage 2: Secondary | ICD-10-CM | POA: Diagnosis present

## 2015-03-09 DIAGNOSIS — I501 Left ventricular failure: Secondary | ICD-10-CM | POA: Diagnosis present

## 2015-03-09 DIAGNOSIS — Z66 Do not resuscitate: Secondary | ICD-10-CM | POA: Diagnosis present

## 2015-03-09 DIAGNOSIS — G4733 Obstructive sleep apnea (adult) (pediatric): Secondary | ICD-10-CM | POA: Diagnosis not present

## 2015-03-09 DIAGNOSIS — Z888 Allergy status to other drugs, medicaments and biological substances status: Secondary | ICD-10-CM | POA: Diagnosis not present

## 2015-03-09 DIAGNOSIS — E662 Morbid (severe) obesity with alveolar hypoventilation: Secondary | ICD-10-CM | POA: Diagnosis present

## 2015-03-09 LAB — COMPREHENSIVE METABOLIC PANEL
ALBUMIN: 2.8 g/dL — AB (ref 3.5–5.0)
ALK PHOS: 64 U/L (ref 38–126)
ALT: 10 U/L — ABNORMAL LOW (ref 14–54)
AST: 22 U/L (ref 15–41)
Anion gap: 6 (ref 5–15)
BILIRUBIN TOTAL: 0.8 mg/dL (ref 0.3–1.2)
BUN: 9 mg/dL (ref 6–20)
CALCIUM: 8.9 mg/dL (ref 8.9–10.3)
CO2: 27 mmol/L (ref 22–32)
Chloride: 106 mmol/L (ref 101–111)
Creatinine, Ser: 0.99 mg/dL (ref 0.44–1.00)
GFR calc Af Amer: >60 mL/min (ref >60)
GFR calc non Af Amer: 58 mL/min — ABNORMAL LOW (ref >60)
GLUCOSE: 109 mg/dL — AB (ref 65–99)
Potassium: 3.9 mmol/L (ref 3.5–5.1)
Sodium: 139 mmol/L (ref 135–145)
TOTAL PROTEIN: 7.5 g/dL (ref 6.5–8.1)

## 2015-03-09 LAB — CBC WITH DIFFERENTIAL/PLATELET
BASOS ABS: 0 10*3/uL (ref 0.0–0.1)
BASOS PCT: 0 %
Eosinophils Absolute: 0.1 10*3/uL (ref 0.0–0.7)
Eosinophils Relative: 2 %
HEMATOCRIT: 38.4 % (ref 36.0–46.0)
HEMOGLOBIN: 12 g/dL (ref 12.0–15.0)
Lymphocytes Relative: 15 %
Lymphs Abs: 1 10*3/uL (ref 0.7–4.0)
MCH: 31.7 pg (ref 26.0–34.0)
MCHC: 31.3 g/dL (ref 30.0–36.0)
MCV: 101.3 fL — ABNORMAL HIGH (ref 78.0–100.0)
MONOS PCT: 5 %
Monocytes Absolute: 0.3 10*3/uL (ref 0.1–1.0)
NEUTROS ABS: 5.4 10*3/uL (ref 1.7–7.7)
NEUTROS PCT: 78 %
Platelets: 245 10*3/uL (ref 150–400)
RBC: 3.79 MIL/uL — ABNORMAL LOW (ref 3.87–5.11)
RDW: 16 % — ABNORMAL HIGH (ref 11.5–15.5)
WBC: 6.8 10*3/uL (ref 4.0–10.5)

## 2015-03-09 LAB — TSH: TSH: 1.886 u[IU]/mL (ref 0.350–4.500)

## 2015-03-09 LAB — TROPONIN I

## 2015-03-09 LAB — BRAIN NATRIURETIC PEPTIDE: B Natriuretic Peptide: 406 pg/mL — ABNORMAL HIGH (ref 0.0–100.0)

## 2015-03-09 MED ORDER — POTASSIUM CHLORIDE CRYS ER 10 MEQ PO TBCR
10.0000 meq | EXTENDED_RELEASE_TABLET | Freq: Two times a day (BID) | ORAL | Status: AC
  Administered 2015-03-09 – 2015-03-10 (×4): 10 meq via ORAL
  Filled 2015-03-09 (×4): qty 1

## 2015-03-09 MED ORDER — SERTRALINE HCL 50 MG PO TABS
50.0000 mg | ORAL_TABLET | Freq: Every day | ORAL | Status: DC
  Administered 2015-03-09 – 2015-03-13 (×5): 50 mg via ORAL
  Filled 2015-03-09 (×5): qty 1

## 2015-03-09 MED ORDER — TRAZODONE HCL 50 MG PO TABS
100.0000 mg | ORAL_TABLET | Freq: Every evening | ORAL | Status: DC | PRN
  Administered 2015-03-11 – 2015-03-13 (×3): 100 mg via ORAL
  Filled 2015-03-09 (×3): qty 2

## 2015-03-09 MED ORDER — GABAPENTIN 300 MG PO CAPS
300.0000 mg | ORAL_CAPSULE | Freq: Every day | ORAL | Status: DC
  Administered 2015-03-09 – 2015-03-15 (×7): 300 mg via ORAL
  Filled 2015-03-09 (×7): qty 1

## 2015-03-09 MED ORDER — FUROSEMIDE 10 MG/ML IJ SOLN
40.0000 mg | Freq: Two times a day (BID) | INTRAMUSCULAR | Status: DC
  Administered 2015-03-09: 40 mg via INTRAVENOUS
  Filled 2015-03-09: qty 4

## 2015-03-09 MED ORDER — LISINOPRIL 2.5 MG PO TABS
2.5000 mg | ORAL_TABLET | Freq: Every day | ORAL | Status: DC
  Administered 2015-03-09: 2.5 mg via ORAL
  Filled 2015-03-09: qty 1

## 2015-03-09 MED ORDER — ACETAMINOPHEN 325 MG PO TABS
650.0000 mg | ORAL_TABLET | ORAL | Status: DC | PRN
  Administered 2015-03-10 – 2015-03-13 (×7): 650 mg via ORAL
  Filled 2015-03-09 (×7): qty 2

## 2015-03-09 MED ORDER — SACCHAROMYCES BOULARDII 250 MG PO CAPS
250.0000 mg | ORAL_CAPSULE | Freq: Two times a day (BID) | ORAL | Status: DC
  Administered 2015-03-09 – 2015-03-16 (×15): 250 mg via ORAL
  Filled 2015-03-09 (×15): qty 1

## 2015-03-09 MED ORDER — LOPERAMIDE HCL 2 MG PO CAPS: 2.0000 mg | ORAL_CAPSULE | Freq: Four times a day (QID) | ORAL | Status: DC | PRN

## 2015-03-09 MED ORDER — NEBIVOLOL HCL 10 MG PO TABS
10.0000 mg | ORAL_TABLET | Freq: Every day | ORAL | Status: DC
  Administered 2015-03-09 – 2015-03-10 (×2): 10 mg via ORAL
  Filled 2015-03-09 (×2): qty 1

## 2015-03-09 MED ORDER — ONDANSETRON HCL 4 MG/2ML IJ SOLN
4.0000 mg | Freq: Four times a day (QID) | INTRAMUSCULAR | Status: DC | PRN
  Filled 2015-03-09: qty 2

## 2015-03-09 MED ORDER — DOXYCYCLINE HYCLATE 100 MG PO TABS
100.0000 mg | ORAL_TABLET | Freq: Two times a day (BID) | ORAL | Status: DC
  Administered 2015-03-09 – 2015-03-10 (×3): 100 mg via ORAL
  Filled 2015-03-09 (×3): qty 1

## 2015-03-09 MED ORDER — CETYLPYRIDINIUM CHLORIDE 0.05 % MT LIQD
7.0000 mL | Freq: Two times a day (BID) | OROMUCOSAL | Status: DC
  Administered 2015-03-10 – 2015-03-16 (×12): 7 mL via OROMUCOSAL

## 2015-03-09 MED ORDER — FUROSEMIDE 10 MG/ML IJ SOLN
40.0000 mg | Freq: Once | INTRAMUSCULAR | Status: AC
  Administered 2015-03-09: 40 mg via INTRAVENOUS
  Filled 2015-03-09: qty 4

## 2015-03-09 MED ORDER — SODIUM CHLORIDE 0.9 % IJ SOLN
3.0000 mL | Freq: Two times a day (BID) | INTRAMUSCULAR | Status: DC
  Administered 2015-03-09 – 2015-03-16 (×11): 3 mL via INTRAVENOUS

## 2015-03-09 MED ORDER — ENOXAPARIN SODIUM 40 MG/0.4ML ~~LOC~~ SOLN
40.0000 mg | SUBCUTANEOUS | Status: DC
Start: 2015-03-09 — End: 2015-03-10
  Administered 2015-03-09: 40 mg via SUBCUTANEOUS
  Filled 2015-03-09: qty 0.4

## 2015-03-09 MED ORDER — SODIUM CHLORIDE 0.9 % IJ SOLN
3.0000 mL | INTRAMUSCULAR | Status: DC | PRN
  Administered 2015-03-12: 3 mL via INTRAVENOUS
  Filled 2015-03-09: qty 3

## 2015-03-09 MED ORDER — ASPIRIN EC 81 MG PO TBEC
81.0000 mg | DELAYED_RELEASE_TABLET | Freq: Every day | ORAL | Status: DC
  Administered 2015-03-09 – 2015-03-11 (×3): 81 mg via ORAL
  Filled 2015-03-09 (×3): qty 1

## 2015-03-09 MED ORDER — SODIUM CHLORIDE 0.9 % IV SOLN
250.0000 mL | INTRAVENOUS | Status: DC | PRN
  Administered 2015-03-10: 250 mL via INTRAVENOUS

## 2015-03-09 MED ORDER — FUROSEMIDE 10 MG/ML IJ SOLN
60.0000 mg | Freq: Three times a day (TID) | INTRAMUSCULAR | Status: DC
  Administered 2015-03-09 – 2015-03-14 (×15): 60 mg via INTRAVENOUS
  Filled 2015-03-09 (×16): qty 6

## 2015-03-09 MED ORDER — LORAZEPAM 0.5 MG PO TABS: 0.2500 mg | ORAL_TABLET | Freq: Two times a day (BID) | ORAL | Status: DC | PRN

## 2015-03-09 MED ORDER — FAMOTIDINE 20 MG PO TABS
20.0000 mg | ORAL_TABLET | Freq: Two times a day (BID) | ORAL | Status: DC
  Administered 2015-03-09 – 2015-03-16 (×15): 20 mg via ORAL
  Filled 2015-03-09 (×15): qty 1

## 2015-03-09 MED ORDER — FUROSEMIDE 10 MG/ML IJ SOLN: 40.0000 mg | Freq: Two times a day (BID) | INTRAMUSCULAR | Status: DC

## 2015-03-09 NOTE — Progress Notes (Signed)
PT Cancellation Note  Patient Details Name: [name] MRN: [id number] DOB: September 18, 1950   Cancelled Treatment:    Reason Eval/Treat Not Completed: Attempted Pt eval-pt declined participation on today. Pt reports HHPT is following in home. Recommend pt resume HHPT at discharge. Will attempt to work with pt in hospital as able. Thanks.   Weston Anna, MPT Pager: [phone]

## 2015-03-09 NOTE — ED Notes (Signed)
This RN called for a Bariatric bed for 1443.

## 2015-03-09 NOTE — Progress Notes (Signed)
VASCULAR LAB PRELIMINARY  PRELIMINARY  PRELIMINARY  PRELIMINARY  Bilateral lower extremity venous duplex  completed.    Preliminary report:  Bilateral:  No evidence of DVT, superficial thrombosis, or Baker's Cyst.  Calf veins not well visualized.    Akanksha Bellmore, RVT 03/09/2015, 4:01 PM

## 2015-03-09 NOTE — Progress Notes (Signed)
CMT called and notified writer of patient having frequent pauses. TRH on call notified. Orders received for an EKG, and told to not give ativan tonight and hold nebivolol in AM. Will continue to monitor closely.

## 2015-03-09 NOTE — Progress Notes (Signed)
RT placed patient on CPAP. CPAP setting is auto 4-16 cmH20. Sterile water was added to water chamber for humidification. Patient is tolerating well. RT will continue to monitor.

## 2015-03-09 NOTE — ED Provider Notes (Signed)
CSN: [id number]     Arrival date & time 03/08/15  2330 History  This chart was scribed for Julianne Rice, MD by Irene Pap, ED Scribe. This patient was seen in room WA13/WA13 and patient care was started at 12:15 AM.     Chief Complaint  Patient presents with  . Near Syncope   The history is provided by the patient. No language interpreter was used.  HPI Comments: [name] is a 64 y.o. female with hx of HTN, cellulitis, breast cancer, dysrhythmia, PAF, pneumonia, chronic bronchitis, sleep apnea on CPAP, and right leg DVT who presents to the Emergency Department complaining of near syncope onset PTA. Pt states she was adjusting her bed at home and that she lowered her head was below her feet that were elevated, was unable to breathe and proceeded to pass out for a few seconds. Pt states that she was helped by a neighbor but it was hard for her to see them. She reports feeling better after sitting up but said that she continued to have difficulty breathing after. She states that she has been having a productive cough with thick, off white mucus for a few weeks. Reports that she was discharged yesterday after being admitted for cellulitis and pneumonia and is currently on antibiotics.  Pt currently denies SOB. She denies taking pain medication before arrival, biting her tongue, or being on oxygen at home. Pt reports being on aspirin for irregular heart beat.    Past Medical History  Diagnosis Date  . Obesities, morbid   . Hypertension   . Cellulitis     right breast, right arm, ble  . Depression   . Anxiety   . 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 Breast cancer 1988  . Dysrhythmia   . PAF (paroxysmal atrial fibrillation) 06/04/2012  . Pneumonia     "several times; last time was in the 1990's" (04/07/2013)  . Chronic bronchitis     "maybe 4 years in a row; last time was ~1990"  (04/07/2013)  . Obstructive sleep apnea on CPAP 06/04/2012  . Exertional shortness of breath   . Arthritis     "knees; fingers/hands" (04/07/2013)  . Stasis dermatitis   . Right leg DVT 04/06/2013    "behind my knee" (04/07/2013)   Past Surgical History  Procedure Laterality Date  . Abdominal hysterectomy  1991  . Carpal tunnel release Right 1995  . Axillary lymph node dissection Right 1988  . Wound debridement  09/03/2011    Procedure: DEBRIDEMENT WOUND;  Surgeon: Odis Hollingshead, MD;  Location: [facility];  Service: General;  Laterality: Right;  . Breast lumpectomy Right 1988  . Breast surgery Right 10/2011    "cut out calcification" (04/07/2013)  . Cataract extraction w/ intraocular lens implant Right ~ 2006   Family History  Problem Relation Age of Onset  . Hypertension    . Other Mother   . Other Father    Social History  Substance Use Topics  . Smoking status: Former Smoker -- 0.50 packs/day for 40 years    Types: Cigarettes    Quit date: 07/01/2004  . Smokeless tobacco: Never Used  . Alcohol Use: No   OB History    No data available     Review of Systems  Constitutional: Negative for fever and chills.  Respiratory: Positive for cough and shortness  of breath.   Cardiovascular: Negative for chest pain, palpitations and leg swelling.  Gastrointestinal: Negative for nausea, vomiting, abdominal pain and diarrhea.  Musculoskeletal: Negative for myalgias, back pain, neck pain and neck stiffness.  Skin: Negative for rash and wound.  Neurological: Positive for syncope. Negative for dizziness, weakness, light-headedness, numbness and headaches.  All other systems reviewed and are negative.     Allergies  Effexor  Home Medications   Prior to Admission medications   Medication Sig Start Date End Date Taking? Authorizing Provider  aspirin EC 81 MG tablet Take 1 tablet (81 mg total) by mouth daily. 04/08/13  Yes Charlynne Cousins, MD  diphenhydrAMINE (BENADRYL) 25  mg capsule Take 1 capsule (25 mg total) by mouth every 6 (six) hours as needed for allergies. 03/13/13  Yes Orson Eva, MD  doxycycline (VIBRA-TABS) 100 MG tablet Take 1 tablet (100 mg total) by mouth every 12 (twelve) hours. 03/07/15  Yes Robbie Lis, MD  famotidine (PEPCID) 20 MG tablet Take 1 tablet (20 mg total) by mouth 2 (two) times daily. 03/07/15  Yes Robbie Lis, MD  fluconazole (DIFLUCAN) 200 MG tablet Take 1 tablet (200 mg total) by mouth daily. 03/07/15  Yes Robbie Lis, MD  gabapentin (NEURONTIN) 300 MG capsule Take 1 capsule (300 mg total) by mouth at bedtime as needed (nerve pain). Patient taking differently: Take 300 mg by mouth at bedtime.  03/13/13  Yes Orson Eva, MD  loperamide (IMODIUM A-D) 2 MG tablet Take 2 mg by mouth 4 (four) times daily as needed for diarrhea or loose stools.   Yes Historical Provider, MD  LORazepam (ATIVAN) 0.5 MG tablet Take 0.25 mg by mouth every 12 (twelve) hours as needed for anxiety.   Yes Historical Provider, MD  nebivolol (BYSTOLIC) 10 MG tablet Take 10 mg by mouth daily.    Yes Historical Provider, MD  saccharomyces boulardii (FLORASTOR) 250 MG capsule Take 1 capsule (250 mg total) by mouth 2 (two) times daily. 03/07/15  Yes Robbie Lis, MD  sertraline (ZOLOFT) 50 MG tablet TK 1 T PO  QHS. 01/21/15  Yes Historical Provider, MD  traZODone (DESYREL) 100 MG tablet TK 1 T PO  HS. 02/09/15  Yes Historical Provider, MD  albuterol (PROVENTIL) (2.5 MG/3ML) 0.083% nebulizer solution Take 3 mLs (2.5 mg total) by nebulization every 6 (six) hours as needed for wheezing or shortness of breath. 03/07/15   Robbie Lis, MD   BP 124/66 mmHg  Pulse 78  Temp(Src) 97.7 F (36.5 C) (Oral)  Resp 16  Ht 5\' 4"  (1.626 m)  Wt 340 lb (154.223 kg)  BMI 58.33 kg/m2  SpO2 98% Physical Exam  Constitutional: She is oriented to person, place, and time. She appears well-developed and well-nourished. No distress.  Morbidly obese  HENT:  Head: Normocephalic and atraumatic.   Mouth/Throat: Oropharynx is clear and moist. No oropharyngeal exudate.  Eyes: EOM are normal. Pupils are equal, round, and reactive to light.  Neck: Normal range of motion. Neck supple.  Cardiovascular:  Irregularly irregular  Pulmonary/Chest: Effort normal and breath sounds normal. No respiratory distress. She has no wheezes. She has no rales.  Crackles in the right lung  Abdominal: Soft. Bowel sounds are normal. She exhibits no distension and no mass. There is no tenderness. There is no rebound and no guarding.  Musculoskeletal: Normal range of motion. She exhibits no edema or tenderness.  Neurological: She is alert and oriented to person, place, and time.  Moving all  extremities without deficit. Sensation intact.  Skin: Skin is warm and dry. No rash noted. No erythema.  No definite cellulitis visualized  Psychiatric: She has a normal mood and affect. Her behavior is normal.  Nursing note and vitals reviewed.   ED Course  Procedures (including critical care time) DIAGNOSTIC STUDIES: Oxygen Saturation is 97% on RA, normal by my interpretation.    COORDINATION OF CARE: 12:19 AM-Discussed treatment plan which includes EKG and labs with pt at bedside and pt agreed to plan.   Labs Review Labs Reviewed  CBC WITH DIFFERENTIAL/PLATELET - Abnormal; Notable for the following:    RBC 3.79 (*)    MCV 101.3 (*)    RDW 16.0 (*)    All other components within normal limits  COMPREHENSIVE METABOLIC PANEL - Abnormal; Notable for the following:    Glucose, Bld 109 (*)    Albumin 2.8 (*)    ALT 10 (*)    GFR calc non Af Amer 58 (*)    All other components within normal limits  BRAIN NATRIURETIC PEPTIDE - Abnormal; Notable for the following:    B Natriuretic Peptide 406.0 (*)    All other components within normal limits  Randolm Idol, ED    Imaging Review Dg Chest Port 1 View  03/09/2015   CLINICAL DATA:  Repeated syncopal episodes. History of morbid obesity, hypertension, RIGHT  breast cancer, chronic bronchitis, shortness of breath, RIGHT leg DVT.  EXAM: PORTABLE CHEST - 1 VIEW  COMPARISON:  Chest radiograph February 27, 2015  FINDINGS: The cardiac silhouette is mildly enlarged, unchanged. Calcified aortic knob. Diffuse interstitial prominence, patchy mid and lower lung zone alveolar airspace opacities with air bronchograms. No pleural effusion. No pneumothorax. RIGHT chest wall surgical clips and coarse calcifications projecting RIGHT breast. Severe degenerative change of the shoulders, widened RIGHT AC joint suggests old separation.  IMPRESSION: Stable cardiomegaly. Interstitial prominence could represent pulmonary edema or atypical infection with increasing mid and lower lung zone confluent edema or pneumonia. Recommend after treatment to verify improvement.   Electronically Signed   By: Elon Alas M.D.   On: 03/09/2015 01:07      EKG Interpretation   Date/Time:  Friday March 08 2015 23:47:15 EDT Ventricular Rate:  80 PR Interval:  214 QRS Duration: 87 QT Interval:  447 QTC Calculation: 516 R Axis:   105 Text Interpretation:  Wandering atrial pacemaker Anteroseptal infarct, age  indeterminate Prolonged QT interval Baseline wander in lead(s) V5  Confirmed by Lita Mains  MD, DAVID ([id number]) on 03/09/2015 3:47:23 AM      MDM   Final diagnoses:  CHF exacerbation  Syncope, unspecified syncope type    I personally performed the services described in this documentation, which was scribed in my presence. The recorded information has been reviewed and is accurate.   Patient requiring oxygen to maintain saturations above 90% in the emergency department. Pulmonary edema versus atypical infection on chest x-ray. Elevated BNP. Normal white blood cell count. Believe the patient's symptoms likely due to pulmonary edema and not infection. Discussed with Dr. Arnoldo Morale and will admit to telemetry bed.  Julianne Rice, MD 03/09/15 [phone]

## 2015-03-09 NOTE — Progress Notes (Addendum)
Patient seen and examined, 64 year old morbidly obese female with a history of paroxysmal atrial fibrillation, hypertension, obstructive sleep apnea, morbid obesity, lymphedema, prior history of DVT, who presented with a syncopal episode and worsening shortness of breath over the last 2 days.  Telemetry revealed that the patient has an slow atrial fibrillation, rate controlled, blood pressure stable  Continues to endorse shortness of breath, currently on 4 L of oxygen  Plan Repeat EKG Continue to cycle cardiac enzymes 2-D echo pending Venous Doppler to rule out DVT, if positive will order CTA of the chest to rule out PE Patient is bedbound and does not ambulate Wound care consultation for bilateral lower extremity edema According to the patient the patient has never been on anticoagulation for atrial fibrillation, likely secondary to her fall risk

## 2015-03-09 NOTE — ED Notes (Signed)
Hospitalist at bedside 

## 2015-03-09 NOTE — H&P (Signed)
[facility] Admission History and Physical       [name] YOV:[id number] DOB: Nov 04, 1950 DOA: 03/08/2015  Referring physician: EDP PCP: Alvester Chou, NP  Specialists:   Chief Complaint: SOB and Passed Out  HPI: [name] is a 64 y.o. female with a history of PAF, HTN, OSA, Morbid Obesity, Lymphedema, [place] a remote history of Breast Cancer S/P Lumpectomy who presents to the ED with complaints of worsening SOB over the past 24 hours and syncope x 2 which occurred after she was repositioned in bed.  She reports having a headache and feeling Dizzy before passing out.  She was found by EMSs to have O2 sats of 85% and was placed on 4 liters of NCO2.    She was discharged from the hospital on 09/15 for Cellulitis of the Left Forearm, and CAP Pneumonia and was discharged to home on Doxycyline Rx for 5 days.    In the ED, she was found to have pulmonary edema on chest X-Ray and was initially administered 40 mg of IV Lasix x 1 and referred for admission.     Review of Systems:  Constitutional: No Weight Loss, No Weight Gain, Night Sweats, Fevers, Chills, Dizziness, Light Headedness, Fatigue, or Generalized Weakness HEENT: No Headaches, Difficulty Swallowing,Tooth/Dental Problems,Sore Throat,  No Sneezing, Rhinitis, Ear Ache, Nasal Congestion, or Post Nasal Drip,  Cardio-vascular:  No Chest pain, Orthopnea, PND, Edema in Lower Extremities, Anasarca, Dizziness, Palpitations  Resp: +Dyspnea, No DOE, No Productive Cough, No Non-Productive Cough, No Hemoptysis, No Wheezing.    GI: No Heartburn, Indigestion, Abdominal Pain, Nausea, Vomiting, Diarrhea, Constipation, Hematemesis, Hematochezia, Melena, Change in Bowel Habits,  Loss of Appetite  GU: No Dysuria, No Change in Color of Urine, No Urgency or Urinary Frequency, No Flank pain.  Musculoskeletal: No Joint Pain or Swelling, No Decreased Range of Motion, No Back Pain.  Neurologic: No Syncope, No Seizures, Muscle Weakness,  Paresthesia, Vision Disturbance or Loss, No Diplopia, No Vertigo, No Difficulty Walking,  Skin: No Rash or Lesions. Psych: No Change in Mood or Affect, No Depression or Anxiety, No Memory loss, No Confusion, or Hallucinations   Past Medical History  Diagnosis Date  . Obesities, morbid   . Hypertension   . Cellulitis     right breast, right arm, ble  . Depression   . Anxiety   . 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 Breast cancer 1988  . Dysrhythmia   . PAF (paroxysmal atrial fibrillation) 06/04/2012  . Pneumonia     "several times; last time was in the 1990's" (04/07/2013)  . Chronic bronchitis     "maybe 4 years in a row; last time was ~1990" (04/07/2013)  . Obstructive sleep apnea on CPAP 06/04/2012  . Exertional shortness of breath   . Arthritis     "knees; fingers/hands" (04/07/2013)  . Stasis dermatitis   . Right leg DVT 04/06/2013    "behind my knee" (04/07/2013)     Past Surgical History  Procedure Laterality Date  . Abdominal hysterectomy  1991  . Carpal tunnel release Right 1995  . Axillary lymph node dissection Right 1988  . Wound debridement  09/03/2011    Procedure: DEBRIDEMENT WOUND;  Surgeon: Odis Hollingshead, MD;  Location: [facility];  Service: General;  Laterality: Right;  . Breast lumpectomy Right 1988  . Breast surgery Right 10/2011    "  cut out calcification" (04/07/2013)  . Cataract extraction w/ intraocular lens implant Right ~ 2006      Prior to Admission medications   Medication Sig Start Date End Date Taking? Authorizing Provider  aspirin EC 81 MG tablet Take 1 tablet (81 mg total) by mouth daily. 04/08/13  Yes Charlynne Cousins, MD  diphenhydrAMINE (BENADRYL) 25 mg capsule Take 1 capsule (25 mg total) by mouth every 6 (six) hours as needed for allergies. 03/13/13  Yes Orson Eva, MD  doxycycline (VIBRA-TABS) 100 MG tablet Take 1 tablet (100 mg  total) by mouth every 12 (twelve) hours. 03/07/15  Yes Robbie Lis, MD  famotidine (PEPCID) 20 MG tablet Take 1 tablet (20 mg total) by mouth 2 (two) times daily. 03/07/15  Yes Robbie Lis, MD  fluconazole (DIFLUCAN) 200 MG tablet Take 1 tablet (200 mg total) by mouth daily. 03/07/15  Yes Robbie Lis, MD  gabapentin (NEURONTIN) 300 MG capsule Take 1 capsule (300 mg total) by mouth at bedtime as needed (nerve pain). Patient taking differently: Take 300 mg by mouth at bedtime.  03/13/13  Yes Orson Eva, MD  loperamide (IMODIUM A-D) 2 MG tablet Take 2 mg by mouth 4 (four) times daily as needed for diarrhea or loose stools.   Yes Historical Provider, MD  LORazepam (ATIVAN) 0.5 MG tablet Take 0.25 mg by mouth every 12 (twelve) hours as needed for anxiety.   Yes Historical Provider, MD  nebivolol (BYSTOLIC) 10 MG tablet Take 10 mg by mouth daily.    Yes Historical Provider, MD  saccharomyces boulardii (FLORASTOR) 250 MG capsule Take 1 capsule (250 mg total) by mouth 2 (two) times daily. 03/07/15  Yes Robbie Lis, MD  sertraline (ZOLOFT) 50 MG tablet TK 1 T PO  QHS. 01/21/15  Yes Historical Provider, MD  traZODone (DESYREL) 100 MG tablet TK 1 T PO  HS. 02/09/15  Yes Historical Provider, MD  albuterol (PROVENTIL) (2.5 MG/3ML) 0.083% nebulizer solution Take 3 mLs (2.5 mg total) by nebulization every 6 (six) hours as needed for wheezing or shortness of breath. 03/07/15   Robbie Lis, MD     Allergies  Allergen Reactions  . Effexor [Venlafaxine Hydrochloride] Anxiety    Made her want to kill herself    Social History:  reports that she quit smoking about 10 years ago. Her smoking use included Cigarettes. She has a 20 pack-year smoking history. She has never used smokeless tobacco. She reports that she does not drink alcohol or use illicit drugs.    Family History  Problem Relation Age of Onset  . Hypertension    . Other Mother   . Other Father        Physical Exam:  GEN:  Pleasant Morbidly  Obese  64 y.o. Caucasian female examined and in no acute distress; cooperative with exam Filed Vitals:   03/08/15 2344 03/09/15 0221  BP: 143/83 124/66  Pulse: 75 78  Temp: 97.7 F (36.5 C)   TempSrc: Oral   Resp: 23 16  Height: 5\' 4"  (1.626 m)   Weight: 154.223 kg (340 lb)   SpO2: 97% 98%   Blood pressure 124/66, pulse 78, temperature 97.7 F (36.5 C), temperature source Oral, resp. rate 16, height 5\' 4"  (1.626 m), weight 154.223 kg (340 lb), SpO2 98 %. PSYCH: She is alert and oriented x4; does not appear anxious does not appear depressed; affect is normal HEENT: Normocephalic and Atraumatic, Mucous membranes pink; PERRLA; EOM intact; Fundi:  Benign;  No scleral icterus, Nares: Patent, Oropharynx: Clear, Fair Dentition,    Neck:  FROM, No Cervical Lymphadenopathy nor Thyromegaly or Carotid Bruit; No JVD; Breasts:: Not examined CHEST WALL: No tenderness CHEST: Normal respiration, clear to auscultation bilaterally HEART: Regular rate and rhythm; no murmurs rubs or gallops BACK: No kyphosis or scoliosis; No CVA tenderness ABDOMEN: Positive Bowel Sounds, Obese, Soft Non-Tender, No Rebound or Guarding; No Masses, No Organomegaly Rectal Exam: Not done EXTREMITIES: No Cyanosis, Clubbing, +Edema; +Stasis Dermatitis of BLEs, . Genitalia: not examined PULSES: 2+ and symmetric SKIN: Normal hydration no rash or ulceration CNS:  Alert and Oriented x 4, No Focal Deficits Vascular: pulses palpable throughout    Labs on Admission:  Basic Metabolic Panel:  Recent Labs Lab 03/02/15 0531 03/04/15 0545 03/05/15 0635 03/06/15 0810 03/09/15 0010  NA 135 136 137  --  139  K 4.2 5.3* 4.4  --  3.9  CL 105 107 107  --  106  CO2 26 22 26   --  27  GLUCOSE 87 93 99  --  109*  BUN 14 8 7   --  9  CREATININE 1.10* 0.95 0.86 0.90 0.99  CALCIUM 8.4* 8.4* 8.6*  --  8.9   Liver Function Tests:  Recent Labs Lab 03/09/15 0010  AST 22  ALT 10*  ALKPHOS 64  BILITOT 0.8  PROT 7.5  ALBUMIN 2.8*     No results for input(s): LIPASE, AMYLASE in the last 168 hours. No results for input(s): AMMONIA in the last 168 hours. CBC:  Recent Labs Lab 03/05/15 0635 03/09/15 0010  WBC 8.5 6.8  NEUTROABS  --  5.4  HGB 12.2 12.0  HCT 38.9 38.4  MCV 101.0* 101.3*  PLT 185 245   Cardiac Enzymes: No results for input(s): CKTOTAL, CKMB, CKMBINDEX, TROPONINI in the last 168 hours.  BNP (last 3 results)  Recent Labs  03/09/15 0027  BNP 406.0*    ProBNP (last 3 results) No results for input(s): PROBNP in the last 8760 hours.  CBG: No results for input(s): GLUCAP in the last 168 hours.  Radiological Exams on Admission: Dg Chest Port 1 View  03/09/2015   CLINICAL DATA:  Repeated syncopal episodes. History of morbid obesity, hypertension, RIGHT breast cancer, chronic bronchitis, shortness of breath, RIGHT leg DVT.  EXAM: PORTABLE CHEST - 1 VIEW  COMPARISON:  Chest radiograph February 27, 2015  FINDINGS: The cardiac silhouette is mildly enlarged, unchanged. Calcified aortic knob. Diffuse interstitial prominence, patchy mid and lower lung zone alveolar airspace opacities with air bronchograms. No pleural effusion. No pneumothorax. RIGHT chest wall surgical clips and coarse calcifications projecting RIGHT breast. Severe degenerative change of the shoulders, widened RIGHT AC joint suggests old separation.  IMPRESSION: Stable cardiomegaly. Interstitial prominence could represent pulmonary edema or atypical infection with increasing mid and lower lung zone confluent edema or pneumonia. Recommend after treatment to verify improvement.   Electronically Signed   By: Elon Alas M.D.   On: 03/09/2015 01:07      Assessment/Plan:   64 y.o. female with  Principal Problem:   1.     CHF exacerbation- Last 2D ECHO 03/10/2103 with EF = 16-10%   Diastolic CHF   CHF Protocol and Diurese with IV Lasix   2D ECHO in AM   Strict I/Os   Monitor O2 sats    O2 PRN   Active Problems:   2.     Syncope   Cardiac Monitoring   Neuro checks   2D ECHO  3.    Cellulitis of left forearm   Continue Doxycycline Rx x 3 days     4.    Essential hypertension   Monitor BPs   Continue Bystolic Rx      5.    PAF (paroxysmal atrial fibrillation)   On Bystolic and ASA Rx       6.   Lymphedema   Chronic     7.   Stasis dermatitis   Chronic   Lac Hydrin To BLEs BID     8.   Obstructive sleep apnea on CPAP   CPAP qhs      9.   BMI 70 and over, adult   Patient Reports trying to lose weight and has lost 30 pounds   Encourage Continued Weight Loss  10.   DVT Prophylaxis   Lovenox    Code Status:     FULL CODE        Family Communication:    No Family Present    Disposition Plan:    Inpatient Status        Time spent:  [facility] Pager [phone]   If [place] Please Contact the Day Rounding Team MD for [facility]  If 7PM-7AM, Please Contact Night-Floor Coverage  [email or web address] Password [email or web address] 03/09/2015, 3:46 AM     ADDENDUM:   Patient was seen and examined on 03/09/2015

## 2015-03-09 NOTE — ED Notes (Signed)
22G IV in left anterior foot . IV is patent. Verbal permission given to stick patient's foot. Pt denies HXof diabetes.

## 2015-03-10 ENCOUNTER — Inpatient Hospital Stay (HOSPITAL_COMMUNITY): Payer: Medicare Other

## 2015-03-10 DIAGNOSIS — I5031 Acute diastolic (congestive) heart failure: Secondary | ICD-10-CM

## 2015-03-10 DIAGNOSIS — R55 Syncope and collapse: Secondary | ICD-10-CM

## 2015-03-10 DIAGNOSIS — I480 Paroxysmal atrial fibrillation: Secondary | ICD-10-CM

## 2015-03-10 DIAGNOSIS — Z6841 Body Mass Index (BMI) 40.0 and over, adult: Secondary | ICD-10-CM

## 2015-03-10 DIAGNOSIS — L8990 Pressure ulcer of unspecified site, unspecified stage: Secondary | ICD-10-CM | POA: Insufficient documentation

## 2015-03-10 DIAGNOSIS — I10 Essential (primary) hypertension: Secondary | ICD-10-CM

## 2015-03-10 LAB — COMPREHENSIVE METABOLIC PANEL
ALBUMIN: 2.5 g/dL — AB (ref 3.5–5.0)
ALK PHOS: 56 U/L (ref 38–126)
ALT: 11 U/L — ABNORMAL LOW (ref 14–54)
ANION GAP: 4 — AB (ref 5–15)
AST: 20 U/L (ref 15–41)
BUN: 10 mg/dL (ref 6–20)
CO2: 33 mmol/L — AB (ref 22–32)
Calcium: 8.6 mg/dL — ABNORMAL LOW (ref 8.9–10.3)
Chloride: 106 mmol/L (ref 101–111)
Creatinine, Ser: 1.11 mg/dL — ABNORMAL HIGH (ref 0.44–1.00)
GFR calc Af Amer: 59 mL/min — ABNORMAL LOW (ref >60)
GFR calc non Af Amer: 51 mL/min — ABNORMAL LOW (ref >60)
GLUCOSE: 88 mg/dL (ref 65–99)
POTASSIUM: 3.5 mmol/L (ref 3.5–5.1)
SODIUM: 143 mmol/L (ref 135–145)
Total Bilirubin: 0.5 mg/dL (ref 0.3–1.2)
Total Protein: 6.6 g/dL (ref 6.5–8.1)

## 2015-03-10 LAB — CBC
HEMATOCRIT: 34.9 % — AB (ref 36.0–46.0)
HEMOGLOBIN: 10.4 g/dL — AB (ref 12.0–15.0)
MCH: 31 pg (ref 26.0–34.0)
MCHC: 29.8 g/dL — AB (ref 30.0–36.0)
MCV: 103.9 fL — ABNORMAL HIGH (ref 78.0–100.0)
Platelets: 193 10*3/uL (ref 150–400)
RBC: 3.36 MIL/uL — ABNORMAL LOW (ref 3.87–5.11)
RDW: 16 % — AB (ref 11.5–15.5)
WBC: 7 10*3/uL (ref 4.0–10.5)

## 2015-03-10 LAB — TROPONIN I: Troponin I: <0.03 ng/mL (ref <0.031)

## 2015-03-10 LAB — D-DIMER, QUANTITATIVE: D-Dimer, Quant: 2.3 ug/mL-FEU — ABNORMAL HIGH (ref 0.00–0.48)

## 2015-03-10 MED ORDER — NEBIVOLOL HCL 2.5 MG PO TABS
2.5000 mg | ORAL_TABLET | Freq: Every day | ORAL | Status: DC
  Administered 2015-03-11 – 2015-03-16 (×6): 2.5 mg via ORAL
  Filled 2015-03-10 (×6): qty 1

## 2015-03-10 MED ORDER — POTASSIUM CHLORIDE CRYS ER 20 MEQ PO TBCR
40.0000 meq | EXTENDED_RELEASE_TABLET | Freq: Once | ORAL | Status: AC
  Administered 2015-03-10: 40 meq via ORAL
  Filled 2015-03-10: qty 2

## 2015-03-10 MED ORDER — IOHEXOL 350 MG/ML SOLN
125.0000 mL | Freq: Once | INTRAVENOUS | Status: AC | PRN
  Administered 2015-03-10: 125 mL via INTRAVENOUS

## 2015-03-10 MED ORDER — DEXTROSE 5 % IV SOLN
1.0000 g | INTRAVENOUS | Status: DC
  Administered 2015-03-10 – 2015-03-13 (×4): 1 g via INTRAVENOUS
  Filled 2015-03-10 (×4): qty 10

## 2015-03-10 MED ORDER — DEXTROSE 5 % IV SOLN
500.0000 mg | INTRAVENOUS | Status: DC
  Administered 2015-03-10 – 2015-03-11 (×2): 500 mg via INTRAVENOUS
  Filled 2015-03-10 (×2): qty 500

## 2015-03-10 MED ORDER — IOHEXOL 350 MG/ML SOLN: 100.0000 mL | Freq: Once | INTRAVENOUS | Status: DC | PRN

## 2015-03-10 MED ORDER — ENOXAPARIN SODIUM 80 MG/0.8ML ~~LOC~~ SOLN
80.0000 mg | SUBCUTANEOUS | Status: DC
  Administered 2015-03-10 – 2015-03-11 (×2): 80 mg via SUBCUTANEOUS
  Filled 2015-03-10 (×2): qty 0.8

## 2015-03-10 NOTE — Progress Notes (Signed)
RT placed patient on CPAP. Patient setting is 4-16 cmH20. Sterile water was added to water chamber for humidification. RT will continue to monitor as needed.

## 2015-03-10 NOTE — Progress Notes (Addendum)
[facility] PROGRESS NOTE  [name] HUD:[id number] DOB: 1951-04-04 DOA: 03/08/2015 PCP: Alvester Chou, NP  Assessment/Plan: Principal Problem:   CHF exacerbation Active Problems:   BMI 70 and over, adult   Stasis dermatitis   Obstructive sleep apnea on CPAP   PAF (paroxysmal atrial fibrillation)   Cellulitis of left forearm   Lymphedema   Syncope   Essential hypertension   Acute diastolic CHF (congestive heart failure)   Faintness   Pressure ulcer    Recent admission for community-acquired pneumonia Discharged on oral doxycycline on 9/15 Repeat chest x-ray on 9/17 shows atypical infection with increasing mid and lower lung zone edema/pneumonia. Patient still continues to have community-acquired pneumonia Therefore switch back to Rocephin/azithromycin for community-acquired pneumonia for another 5 days. Discontinue doxycycline Speech therapy consultation ordered and pending  Acute on chronic diastolic heart failure-patient being diuresed aggressively with IV Lasix, follow renal function, replete potassium  Severe pulmonary hypertension PA peak pressure 60 mmHg, may need to repeat echo with contrast, cardiology consulted for further recommendations given patient's syncopal episode  Morbid obesity BMI 70 and over,    HTN (hypertension) Continue current antihypertensive therapy and monitor blood pressure.     PAF (paroxysmal atrial fibrillation)-reduced dose of BYSTOLIC to 2.5 mg given pauses on telemetry. Cardiology consulted for further recommendations  Lymphedema-wound care consultation ordered  Obstructive sleep apnea on CPAP. CPAP setting is auto 4-16 cmH20  DVT prophylaxsis Lovenox  Code Status:  Patient confirmed that she is a DO NOT RESUSCITATE DO NOT INTUBATE    Code Status Orders        Start     Ordered   03/10/15 1012  Do not attempt resuscitation (DNR)   Continuous    Question Answer Comment  In the event of cardiac or respiratory  ARREST Do not call a "code blue"   In the event of cardiac or respiratory ARREST Do not perform Intubation, CPR, defibrillation or ACLS   In the event of cardiac or respiratory ARREST Use medication by any route, position, wound care, and other measures to relive pain and suffering. May use oxygen, suction and manual treatment of airway obstruction as needed for comfort.      03/10/15 1012     Family Communication: No family by the bedside Disposition Plan:  Cardiology consult   Brief narrative: [name] is a 64 y.o. female with a history of PAF, HTN, OSA, Morbid Obesity, Lymphedema, Morbiand a remote history of Breast Cancer S/P Lumpectomy who presents to the ED with complaints of worsening SOB over the past 24 hours and syncope x 2 which occurred after she was repositioned in bed. She reports having a headache and feeling Dizzy before passing out. She was found by EMSs to have O2 sats of 85% and was placed on 4 liters of NCO2. She was discharged from the hospital on 09/15 for Cellulitis of the Left Forearm, and CAP Pneumonia and was discharged to home on Doxycyline Rx for 5 days.   In the ED, she was found to have pulmonary edema on chest X-Ray and was initially administered 40 mg of IV Lasix x 1 and referred for admission.   Consultants:  Cardiology  Procedures:  None  Antibiotics: Anti-infectives    Start     Dose/Rate Route Frequency Ordered Stop   03/10/15 1145  azithromycin (ZITHROMAX) 500 mg in dextrose 5 % 250 mL IVPB     500 mg 250 mL/hr over 60 Minutes Intravenous Every  24 hours 03/10/15 1139     03/10/15 1145  cefTRIAXone (ROCEPHIN) 1 g in dextrose 5 % 50 mL IVPB     1 g 100 mL/hr over 30 Minutes Intravenous Every 24 hours 03/10/15 1139     03/09/15 1000  doxycycline (VIBRA-TABS) tablet 100 mg  Status:  Discontinued     100 mg Oral Every 12 hours 03/09/15 0500 03/10/15 1139         HPI/Subjective: Patient feels that the shortness of breath has  improved, since admission, patient noted to have frequent pauses on telemetry, held nebivolol last night  Objective: Filed Vitals:   03/09/15 0523 03/09/15 1420 03/09/15 2103 03/10/15 0633  BP: 126/77 118/76 123/58 126/59  Pulse: 70 70 74 71  Temp: 97.4 F (36.3 C) 97.4 F (36.3 C) 98.5 F (36.9 C) 98.2 F (36.8 C)  TempSrc: Oral Oral Oral Axillary  Resp: 24 22 20 22   Height:      Weight:    182.8 kg (403 lb)  SpO2: 98% 100% 97% 97%    Intake/Output Summary (Last 24 hours) at 03/10/15 1141 Last data filed at 03/10/15 1125  Gross per 24 hour  Intake    600 ml  Output   4975 ml  Net  -4375 ml    Exam:  Cardiovascular:  Irregularly irregular  Pulmonary/Chest: Effort normal and breath sounds normal. No respiratory distress. She has no wheezes. She has no rales.  Crackles in the right lung  Abdominal: Soft. Bowel sounds are normal. She exhibits no distension and no mass. There is no tenderness. There is no rebound and no guarding.  Musculoskeletal: Normal range of motion. She exhibits no edema or tenderness.  Neurological: She is alert and oriented to person, place, and time.  Moving all extremities without deficit. Sensation intact.   Data Review   Micro Results Recent Results (from the past 240 hour(s))  C difficile quick scan w PCR reflex     Status: Abnormal   Collection Time: 03/04/15 11:39 AM  Result Value Ref Range Status   C Diff antigen POSITIVE (A) NEGATIVE Final   C Diff toxin NEGATIVE NEGATIVE Final   C Diff interpretation   Final    C. difficile present, but toxin not detected. This indicates colonization. In most cases, this does not require treatment. If patient has signs and symptoms consistent with colitis, consider treatment.    Radiology Reports Dg Chest 2 View  02/27/2015   CLINICAL DATA:  Persistent cough for several months  EXAM: CHEST - 2 VIEW  COMPARISON:  03/08/2013  FINDINGS: Cardiac shadow is within normal limits. Increased density is noted  along the medial aspect of the right lung base. A portion of this is related to the patient's body habitus although an early infiltrate cannot be excluded. The lateral images are of little utility due to the patient's body habitus.  IMPRESSION: Changes suggestive of early infiltrate in the medial right lung base.   Electronically Signed   By: Inez Catalina M.D.   On: 02/27/2015 17:03   Dg Chest Port 1 View  03/09/2015   CLINICAL DATA:  Repeated syncopal episodes. History of morbid obesity, hypertension, RIGHT breast cancer, chronic bronchitis, shortness of breath, RIGHT leg DVT.  EXAM: PORTABLE CHEST - 1 VIEW  COMPARISON:  Chest radiograph February 27, 2015  FINDINGS: The cardiac silhouette is mildly enlarged, unchanged. Calcified aortic knob. Diffuse interstitial prominence, patchy mid and lower lung zone alveolar airspace opacities with air bronchograms. No pleural effusion.  No pneumothorax. RIGHT chest wall surgical clips and coarse calcifications projecting RIGHT breast. Severe degenerative change of the shoulders, widened RIGHT AC joint suggests old separation.  IMPRESSION: Stable cardiomegaly. Interstitial prominence could represent pulmonary edema or atypical infection with increasing mid and lower lung zone confluent edema or pneumonia. Recommend after treatment to verify improvement.   Electronically Signed   By: Elon Alas M.D.   On: 03/09/2015 01:07     CBC  Recent Labs Lab 03/05/15 0635 03/09/15 0010 03/10/15 0510  WBC 8.5 6.8 7.0  HGB 12.2 12.0 10.4*  HCT 38.9 38.4 34.9*  PLT 185 245 193  MCV 101.0* 101.3* 103.9*  MCH 31.7 31.7 31.0  MCHC 31.4 31.3 29.8*  RDW 15.4 16.0* 16.0*  LYMPHSABS  --  1.0  --   MONOABS  --  0.3  --   EOSABS  --  0.1  --   BASOSABS  --  0.0  --     Chemistries   Recent Labs Lab 03/04/15 0545 03/05/15 0635 03/06/15 0810 03/09/15 0010 03/10/15 0510  NA 136 137  --  139 143  K 5.3* 4.4  --  3.9 3.5  CL 107 107  --  106 106  CO2 22 26  --   27 33*  GLUCOSE 93 99  --  109* 88  BUN 8 7  --  9 10  CREATININE 0.95 0.86 0.90 0.99 1.11*  CALCIUM 8.4* 8.6*  --  8.9 8.6*  AST  --   --   --  22 20  ALT  --   --   --  10* 11*  ALKPHOS  --   --   --  64 56  BILITOT  --   --   --  0.8 0.5   ------------------------------------------------------------------------------------------------------------------ estimated creatinine clearance is 83.3 mL/min (by C-G formula based on Cr of 1.11). ------------------------------------------------------------------------------------------------------------------ No results for input(s): HGBA1C in the last 72 hours. ------------------------------------------------------------------------------------------------------------------ No results for input(s): CHOL, HDL, LDLCALC, TRIG, CHOLHDL, LDLDIRECT in the last 72 hours. ------------------------------------------------------------------------------------------------------------------  Recent Labs  03/09/15 1150  TSH 1.886   ------------------------------------------------------------------------------------------------------------------ No results for input(s): VITAMINB12, FOLATE, FERRITIN, TIBC, IRON, RETICCTPCT in the last 72 hours.  Coagulation profile No results for input(s): INR, PROTIME in the last 168 hours.  No results for input(s): DDIMER in the last 72 hours.  Cardiac Enzymes  Recent Labs Lab 03/09/15 1125 03/09/15 1700 03/09/15 2330  TROPONINI <0.03 <0.03 <0.03   ------------------------------------------------------------------------------------------------------------------ Invalid input(s): POCBNP   CBG: No results for input(s): GLUCAP in the last 168 hours.     Studies: Dg Chest Port 1 View  03/09/2015   CLINICAL DATA:  Repeated syncopal episodes. History of morbid obesity, hypertension, RIGHT breast cancer, chronic bronchitis, shortness of breath, RIGHT leg DVT.  EXAM: PORTABLE CHEST - 1 VIEW  COMPARISON:  Chest  radiograph February 27, 2015  FINDINGS: The cardiac silhouette is mildly enlarged, unchanged. Calcified aortic knob. Diffuse interstitial prominence, patchy mid and lower lung zone alveolar airspace opacities with air bronchograms. No pleural effusion. No pneumothorax. RIGHT chest wall surgical clips and coarse calcifications projecting RIGHT breast. Severe degenerative change of the shoulders, widened RIGHT AC joint suggests old separation.  IMPRESSION: Stable cardiomegaly. Interstitial prominence could represent pulmonary edema or atypical infection with increasing mid and lower lung zone confluent edema or pneumonia. Recommend after treatment to verify improvement.   Electronically Signed   By: Elon Alas M.D.   On: 03/09/2015 01:07      Lab Results  Component Value Date   HGBA1C 5.8* 06/04/2012   HGBA1C 6.2* 07/02/2011   Lab Results  Component Value Date   LDLCALC  09/09/2010    71        Total Cholesterol/HDL:CHD Risk Coronary Heart Disease Risk Table                     Men   Women  1/2 Average Risk   3.4   3.3  Average Risk       5.0   4.4  2 X Average Risk   9.6   7.1  3 X Average Risk  23.4   11.0        Use the calculated Patient Ratio above and the CHD Risk Table to determine the patient's CHD Risk.        ATP III CLASSIFICATION (LDL):  <100     mg/dL   Optimal  100-129  mg/dL   Near or Above                    Optimal  130-159  mg/dL   Borderline  160-189  mg/dL   High  >190     mg/dL   Very High   CREATININE 1.11* 03/10/2015       Scheduled Meds: . antiseptic oral rinse  7 mL Mouth Rinse BID  . aspirin EC  81 mg Oral Daily  . azithromycin  500 mg Intravenous Q24H  . cefTRIAXone (ROCEPHIN)  IV  1 g Intravenous Q24H  . enoxaparin (LOVENOX) injection  80 mg Subcutaneous Q24H  . famotidine  20 mg Oral BID  . furosemide  60 mg Intravenous 3 times per day  . gabapentin  300 mg Oral QHS  . [START ON 03/11/2015] nebivolol  2.5 mg Oral Daily  . potassium  chloride  10 mEq Oral BID  . saccharomyces boulardii  250 mg Oral BID  . sertraline  50 mg Oral QHS  . sodium chloride  3 mL Intravenous Q12H   Continuous Infusions:   Principal Problem:   CHF exacerbation Active Problems:   BMI 70 and over, adult   Stasis dermatitis   Obstructive sleep apnea on CPAP   PAF (paroxysmal atrial fibrillation)   Cellulitis of left forearm   Lymphedema   Syncope   Essential hypertension   Acute diastolic CHF (congestive heart failure)   Faintness   Pressure ulcer    Time spent: 45 minutes   [facility] Pager [phone]. If 7PM-7AM, please contact night-coverage at [email or web address], password [email or web address] 03/10/2015, 11:41 AM  LOS: 1 day

## 2015-03-10 NOTE — Consult Note (Addendum)
Admit date: 03/08/2015 Referring Physician  Dr. Allyson Sabal Primary Physician  Dr. Alvester Chou Primary Cardiologist  None Reason for Consultation  Syncope  HPI: [name] is a 64 y.o. female with a history of PAF, HTN, OSA, Morbid Obesity, Lymphedema, Morbiand a remote history of Breast Cancer S/P Lumpectomy who presents to the ED with complaints of worsening SOB over the past 24 hours and syncope x 2 which occurred after she was repositioned in bed. She reports having a headache and feeling Dizzy before passing out. She was found by EMSs to have O2 sats of 85% and was placed on 4 liters of NCO2. She was discharged from the hospital on 09/15 for Cellulitis of the Left Forearm, and CAP Pneumonia and was discharged to home on Doxycyline Rx for 5 days.   In the ED, she was found to have pulmonary edema on chest X-Ray and was initially administered 40 mg of IV Lasix x 1 and referred for admission. Cardiac enzymes have been negative x 3.  2D echo very limited due to body habitus but did note moderate pulmonary HTN.  Cardiology is now asked to evaluate due to syncope.     PMH:   Past Medical History  Diagnosis Date  . Obesities, morbid   . Hypertension   . Cellulitis     right breast, right arm, ble  . Depression   . Anxiety   . 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 Breast cancer 1988  . Dysrhythmia   . PAF (paroxysmal atrial fibrillation) 06/04/2012  . Pneumonia     "several times; last time was in the 1990's" (04/07/2013)  . Chronic bronchitis     "maybe 4 years in a row; last time was ~1990" (04/07/2013)  . Obstructive sleep apnea on CPAP 06/04/2012  . Exertional shortness of breath   . Arthritis     "knees; fingers/hands" (04/07/2013)  . Stasis dermatitis   . Right leg DVT 04/06/2013    "behind my knee" (04/07/2013)     PSH:   Past Surgical History  Procedure  Laterality Date  . Abdominal hysterectomy  1991  . Carpal tunnel release Right 1995  . Axillary lymph node dissection Right 1988  . Wound debridement  09/03/2011    Procedure: DEBRIDEMENT WOUND;  Surgeon: Odis Hollingshead, MD;  Location: [facility]S;  Service: General;  Laterality: Right;  . Breast lumpectomy Right 1988  . Breast surgery Right 10/2011    "cut out calcification" (04/07/2013)  . Cataract extraction w/ intraocular lens implant Right ~ 2006    Allergies:  Effexor Prior to Admit Meds:   Prescriptions prior to admission  Medication Sig Dispense Refill Last Dose  . aspirin EC 81 MG tablet Take 1 tablet (81 mg total) by mouth daily.   03/08/2015 at Unknown time  . diphenhydrAMINE (BENADRYL) 25 mg capsule Take 1 capsule (25 mg total) by mouth every 6 (six) hours as needed for allergies. 30 capsule 0 03/08/2015 at Unknown time  . doxycycline (VIBRA-TABS) 100 MG tablet Take 1 tablet (100 mg total) by mouth every 12 (twelve) hours. 8 tablet 0 03/08/2015 at Unknown time  . famotidine (PEPCID) 20 MG tablet Take 1 tablet (20 mg total) by mouth 2 (two) times daily. 60 tablet 0 03/08/2015 at Unknown time  . fluconazole (DIFLUCAN) 200 MG tablet Take 1 tablet (200 mg  total) by mouth daily. 5 tablet 0 03/08/2015 at Unknown time  . gabapentin (NEURONTIN) 300 MG capsule Take 1 capsule (300 mg total) by mouth at bedtime as needed (nerve pain). (Patient taking differently: Take 300 mg by mouth at bedtime. ) 30 capsule 0 3 weeks ago  . loperamide (IMODIUM A-D) 2 MG tablet Take 2 mg by mouth 4 (four) times daily as needed for diarrhea or loose stools.   03/08/2015 at Unknown time  . LORazepam (ATIVAN) 0.5 MG tablet Take 0.25 mg by mouth every 12 (twelve) hours as needed for anxiety.   03/08/2015 at Unknown time  . nebivolol (BYSTOLIC) 10 MG tablet Take 10 mg by mouth daily.    03/08/2015 at 2100  . saccharomyces boulardii (FLORASTOR) 250 MG capsule Take 1 capsule (250 mg total) by mouth 2 (two) times daily. 60  capsule 0 03/08/2015 at Unknown time  . sertraline (ZOLOFT) 50 MG tablet TK 1 T PO  QHS.  4 03/08/2015 at Unknown time  . traZODone (DESYREL) 100 MG tablet TK 1 T PO  HS.  3 03/08/2015 at Unknown time  . albuterol (PROVENTIL) (2.5 MG/3ML) 0.083% nebulizer solution Take 3 mLs (2.5 mg total) by nebulization every 6 (six) hours as needed for wheezing or shortness of breath. 75 mL 0 NEVER TAKEN PATIENT DOES NOT HAVE A NEBULIZER MACHINE   Fam HX:    Family History  Problem Relation Age of Onset  . Hypertension    . Other Mother   . Other Father    Social HX:    Social History   Social History  . Marital Status: Married    Spouse Name: N/A  . Number of Children: N/A  . Years of Education: N/A   Occupational History  . Not on file.   Social History Main Topics  . Smoking status: Former Smoker -- 0.50 packs/day for 40 years    Types: Cigarettes    Quit date: 07/01/2004  . Smokeless tobacco: Never Used  . Alcohol Use: No  . Drug Use: No  . Sexual Activity: No   Other Topics Concern  . Not on file   Social History Narrative     ROS:  All 11 ROS were addressed and are negative except what is stated in the HPI  Physical Exam: Blood pressure 126/59, pulse 71, temperature 98.2 F (36.8 C), temperature source Axillary, resp. rate 22, height 5\' 4"  (1.626 m), weight 403 lb (182.8 kg), SpO2 97 %.    General: Well developed, well nourished, in no acute distress Head: Eyes PERRLA, No xanthomas.   Normal cephalic and atramatic  Lungs:   Clear bilaterally to auscultation and percussion. Heart:   HRRR S1 S2 Pulses are 2+ & equal.            No carotid bruit. No JVD.  No abdominal bruits. No femoral bruits. Abdomen: Bowel sounds are positive, abdomen soft and non-tender without masses Extremities:   No clubbing, cyanosis or edema.  DP +1 Neuro: Alert and oriented X 3. Psych:  Good affect, responds appropriately    Labs:   Lab Results  Component Value Date   WBC 7.0 03/10/2015   HGB  10.4* 03/10/2015   HCT 34.9* 03/10/2015   MCV 103.9* 03/10/2015   PLT 193 03/10/2015    Recent Labs Lab 03/10/15 0510  NA 143  K 3.5  CL 106  CO2 33*  BUN 10  CREATININE 1.11*  CALCIUM 8.6*  PROT 6.6  BILITOT 0.5  ALKPHOS  56  ALT 11*  AST 20  GLUCOSE 88   No results found for: PTT Lab Results  Component Value Date   INR 1.21 04/06/2013   INR 1.1 04/06/2007   Lab Results  Component Value Date   TROPONINI <0.03 03/09/2015     Lab Results  Component Value Date   CHOL  09/09/2010    132        ATP III CLASSIFICATION:  <200     mg/dL   Desirable  200-239  mg/dL   Borderline High  >=240    mg/dL   High          CHOL  03/15/2007    123        ATP III CLASSIFICATION:  <200     mg/dL   Desirable  200-239  mg/dL   Borderline High  >=240    mg/dL   High   Lab Results  Component Value Date   HDL 45 09/09/2010   HDL 42 03/15/2007   Lab Results  Component Value Date   LDLCALC  09/09/2010    71        Total Cholesterol/HDL:CHD Risk Coronary Heart Disease Risk Table                     Men   Women  1/2 Average Risk   3.4   3.3  Average Risk       5.0   4.4  2 X Average Risk   9.6   7.1  3 X Average Risk  23.4   11.0        Use the calculated Patient Ratio above and the CHD Risk Table to determine the patient's CHD Risk.        ATP III CLASSIFICATION (LDL):  <100     mg/dL   Optimal  100-129  mg/dL   Near or Above                    Optimal  130-159  mg/dL   Borderline  160-189  mg/dL   High  >190     mg/dL   Very High   LDLCALC  03/15/2007    66        Total Cholesterol/HDL:CHD Risk Coronary Heart Disease Risk Table                     Men   Women  1/2 Average Risk   3.4   3.3   Lab Results  Component Value Date   TRIG 79 09/09/2010   TRIG 75 03/15/2007   Lab Results  Component Value Date   CHOLHDL 2.9 09/09/2010   CHOLHDL 2.9 03/15/2007   No results found for: LDLDIRECT    Radiology:  Dg Chest Port 1 View  03/09/2015   CLINICAL DATA:   Repeated syncopal episodes. History of morbid obesity, hypertension, RIGHT breast cancer, chronic bronchitis, shortness of breath, RIGHT leg DVT.  EXAM: PORTABLE CHEST - 1 VIEW  COMPARISON:  Chest radiograph February 27, 2015  FINDINGS: The cardiac silhouette is mildly enlarged, unchanged. Calcified aortic knob. Diffuse interstitial prominence, patchy mid and lower lung zone alveolar airspace opacities with air bronchograms. No pleural effusion. No pneumothorax. RIGHT chest wall surgical clips and coarse calcifications projecting RIGHT breast. Severe degenerative change of the shoulders, widened RIGHT AC joint suggests old separation.  IMPRESSION: Stable cardiomegaly. Interstitial prominence could represent pulmonary edema or atypical infection with increasing mid and lower lung  zone confluent edema or pneumonia. Recommend after treatment to verify improvement.   Electronically Signed   By: Elon Alas M.D.   On: 03/09/2015 01:07    EKG:  Atrial fibrillation with septal infarct and low voltage and PVC.  Normal QTc.  ASSESSMENT/PLAN: 1.  Syncope of ? Etiology. EKG is nonischemic and trop is normal x 3.  Qtc was prolonged on EKGs done initially but today is normal.  Consider stopping Zoloft and Trazadone. Need to repeat echo with definity contrast to get better assessment of LVF.  She does have pulmonary HTN but only moderate so doubt this is etiology of syncope unless she had a PE.  She was noted to be hypoxic on arrival and developed acute SOB after the very brief episode of chest pain.  She is a set up for acute DVT (prior DVT 2014) and PE given morbid obesity and had acute hypoxia on admission.  She is not on any oral anticoagulants. LE venous dopplers were negative for DVT but would still get a D-Dimer to rule out PE - not sure with body habitus that she would fit in CT scanner.  2.  Moderate pulmonary HTN - most likely secondary to morbid obesity with obesity hypoventilation syndrome and OSA.     3.  HTN - controlled on BB.   4.  H/O PAF and currently in afib on no anticoagulation.  Anticoagulation had been recommended several years ago but she declined due to history of rectal bleeding.  She had been on xarelto for a short period for DVT 03/2013.  She has a CHADS2VASC score of 3 so recommendations are for long term anticoagulation for stroke prevention.  This has been discussed with the patient but she is very leary given history of rectal bleeding a year ago.  If IM cannot find any contraindication then would recommend starting Xarelto.   BB dose decreased due to some episodes of bradycardia but these were mainly during sleep.  She has had some pauses but all less than 2 seconds in duration.  Monitor on tele.  5.  Acute on chronic diastolic CHF - mildly elevated BNP and CXRAY with increased interstitial prominence.  Agree with IV diuretics.  She has diuresed 4L thus far.  6.  Atypical CP at rest ? Etiology. Cycle cardiac enzymes.  Repeat 2D echo pending to assess LVF.  Sueanne Margarita, MD  03/10/2015  1:46 PM

## 2015-03-10 NOTE — Progress Notes (Signed)
ANTICOAGULATION CONSULT NOTE - Initial Consult  Pharmacy Consult for Lovenox Indication: VTE prophylaxis  Allergies  Allergen Reactions  . Effexor [Venlafaxine Hydrochloride] Anxiety    Made her want to kill herself    Patient Measurements: Height: 5\' 4"  (162.6 cm) Weight: (!) 403 lb (182.8 kg) IBW/kg (Calculated) : 54.7  Vital Signs: Temp: 98.2 F (36.8 C) (09/18 0633) Temp Source: Axillary (09/18 1448) BP: 126/59 mmHg (09/18 0633) Pulse Rate: 71 (09/18 0633)  Labs:  Recent Labs  03/09/15 0010 03/09/15 1125 03/09/15 1700 03/09/15 2330 03/10/15 0510  HGB 12.0  --   --   --  10.4*  HCT 38.4  --   --   --  34.9*  PLT 245  --   --   --  193  CREATININE 0.99  --   --   --  1.11*  TROPONINI  --  <0.03 <0.03 <0.03  --     Estimated Creatinine Clearance: 83.3 mL/min (by C-G formula based on Cr of 1.11).   Medical History: Past Medical History  Diagnosis Date  . Obesities, morbid   . Hypertension   . Cellulitis     right breast, right arm, ble  . Depression   . Anxiety   . 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 Breast cancer 1988  . Dysrhythmia   . PAF (paroxysmal atrial fibrillation) 06/04/2012  . Pneumonia     "several times; last time was in the 1990's" (04/07/2013)  . Chronic bronchitis     "maybe 4 years in a row; last time was ~1990" (04/07/2013)  . Obstructive sleep apnea on CPAP 06/04/2012  . Exertional shortness of breath   . Arthritis     "knees; fingers/hands" (04/07/2013)  . Stasis dermatitis   . Right leg DVT 04/06/2013    "behind my knee" (04/07/2013)    Medications:  Scheduled:  . antiseptic oral rinse  7 mL Mouth Rinse BID  . aspirin EC  81 mg Oral Daily  . doxycycline  100 mg Oral Q12H  . enoxaparin (LOVENOX) injection  80 mg Subcutaneous Q24H  . famotidine  20 mg Oral BID  . furosemide  60 mg Intravenous 3 times per day  .  gabapentin  300 mg Oral QHS  . nebivolol  10 mg Oral Daily  . potassium chloride  10 mEq Oral BID  . saccharomyces boulardii  250 mg Oral BID  . sertraline  50 mg Oral QHS  . sodium chloride  3 mL Intravenous Q12H    Assessment: 64 yo obese, bed bound female admitted with shortness of breath.  She has a history of DVT and Afib but is not on chronic anticoagulation due to fall risk.   CBC reviewed.  No bleeding noted.  Estimated CrCl>30/min.  Goal of Therapy:  Monitor platelets by anticoagulation protocol: Yes   Plan:  Increase Lovenox 80mg  sq daily (~0.5mg /kg/day) Monitor CBC and for s/sx of bleeding Pharmacy to sign off.  Please re-consult as needed.   Biagio Borg 03/10/2015,9:29 AM

## 2015-03-10 NOTE — Progress Notes (Signed)
PT Cancellation Note  Patient Details Name: Kioni Stahl MRN: [id number] DOB: [birth year]   Cancelled Treatment:    Reason Eval/Treat Not Completed:  (pt stated she's too weak to attempt mobility, supplied theraband for her to continue her home exercises for strengthening in bed. ) Pt stated she's bedbound at baseline but had sat on edge of bed with HHPT prior to this admission.    Blondell Reveal Kistler 03/10/2015, 12:48 PM [phone]

## 2015-03-11 ENCOUNTER — Inpatient Hospital Stay (HOSPITAL_COMMUNITY): Payer: Medicare Other

## 2015-03-11 DIAGNOSIS — I509 Heart failure, unspecified: Secondary | ICD-10-CM

## 2015-03-11 DIAGNOSIS — R0600 Dyspnea, unspecified: Secondary | ICD-10-CM

## 2015-03-11 LAB — CBC
HCT: 43 % (ref 36.0–46.0)
Hemoglobin: 13 g/dL (ref 12.0–15.0)
MCH: 32.3 pg (ref 26.0–34.0)
MCHC: 30.2 g/dL (ref 30.0–36.0)
MCV: 106.7 fL — ABNORMAL HIGH (ref 78.0–100.0)
PLATELETS: 189 10*3/uL (ref 150–400)
RBC: 4.03 MIL/uL (ref 3.87–5.11)
RDW: 16.3 % — ABNORMAL HIGH (ref 11.5–15.5)
WBC: 9.1 10*3/uL (ref 4.0–10.5)

## 2015-03-11 LAB — COMPREHENSIVE METABOLIC PANEL
ALK PHOS: 64 U/L (ref 38–126)
ALT: 14 U/L (ref 14–54)
ANION GAP: 10 (ref 5–15)
AST: 22 U/L (ref 15–41)
Albumin: 3 g/dL — ABNORMAL LOW (ref 3.5–5.0)
BUN: 10 mg/dL (ref 6–20)
CALCIUM: 8.8 mg/dL — AB (ref 8.9–10.3)
CHLORIDE: 98 mmol/L — AB (ref 101–111)
CO2: 30 mmol/L (ref 22–32)
Creatinine, Ser: 1.22 mg/dL — ABNORMAL HIGH (ref 0.44–1.00)
GFR calc non Af Amer: 45 mL/min — ABNORMAL LOW (ref >60)
GFR, EST AFRICAN AMERICAN: 52 mL/min — AB (ref >60)
Glucose, Bld: 84 mg/dL (ref 65–99)
Potassium: 4.2 mmol/L (ref 3.5–5.1)
SODIUM: 138 mmol/L (ref 135–145)
Total Bilirubin: 0.6 mg/dL (ref 0.3–1.2)
Total Protein: 8.2 g/dL — ABNORMAL HIGH (ref 6.5–8.1)

## 2015-03-11 MED ORDER — RIVAROXABAN 20 MG PO TABS
20.0000 mg | ORAL_TABLET | Freq: Every day | ORAL | Status: DC
  Administered 2015-03-11 – 2015-03-15 (×5): 20 mg via ORAL
  Filled 2015-03-11 (×5): qty 1

## 2015-03-11 MED ORDER — PERFLUTREN LIPID MICROSPHERE
INTRAVENOUS | Status: AC
  Filled 2015-03-11: qty 10

## 2015-03-11 MED ORDER — PRO-STAT SUGAR FREE PO LIQD
30.0000 mL | Freq: Two times a day (BID) | ORAL | Status: DC
  Administered 2015-03-11 – 2015-03-16 (×7): 30 mL via ORAL
  Filled 2015-03-11 (×8): qty 30

## 2015-03-11 MED ORDER — VITAMINS A & D EX OINT
TOPICAL_OINTMENT | CUTANEOUS | Status: AC
  Administered 2015-03-11: 1
  Filled 2015-03-11: qty 5

## 2015-03-11 MED ORDER — BOOST / RESOURCE BREEZE PO LIQD
1.0000 | Freq: Three times a day (TID) | ORAL | Status: DC
  Administered 2015-03-11 – 2015-03-16 (×14): 1 via ORAL

## 2015-03-11 MED ORDER — PERFLUTREN LIPID MICROSPHERE
1.0000 mL | INTRAVENOUS | Status: AC | PRN
  Filled 2015-03-11: qty 10

## 2015-03-11 NOTE — Progress Notes (Signed)
Pt placed on Auto CPAP 4-16 CMH20 via FFM with 4 LPM O2 bleed in.  Pt tolerating well at this time, RT to monitor and assess as needed.

## 2015-03-11 NOTE — Progress Notes (Deleted)
  Echocardiogram 2D Echocardiogram with Contrast has been performed.  Darlina Sicilian M 03/11/2015, 2:16 PM

## 2015-03-11 NOTE — Evaluation (Addendum)
Clinical/Bedside Swallow Evaluation Patient Details  Name: [name] MRN: [id number] Date of Birth: 08-14-1950  Today's Date: 03/11/2015 Time: SLP Start Time (ACUTE ONLY): 1140 (1300-2nd visit) SLP Stop Time (ACUTE ONLY): 1154 (1320-2nd visit) SLP Time Calculation (min) (ACUTE ONLY): 14 min, + 20 minutes = 35 minutes total  Past Medical History:  Past Medical History  Diagnosis Date  . Obesities, morbid   . Hypertension   . Cellulitis     right breast, right arm, ble  . Depression   . Anxiety   . 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 Breast cancer 1988  . Dysrhythmia   . PAF (paroxysmal atrial fibrillation) 06/04/2012  . Pneumonia     "several times; last time was in the 1990's" (04/07/2013)  . Chronic bronchitis     "maybe 4 years in a row; last time was ~1990" (04/07/2013)  . Obstructive sleep apnea on CPAP 06/04/2012  . Exertional shortness of breath   . Arthritis     "knees; fingers/hands" (04/07/2013)  . Stasis dermatitis   . Right leg DVT 04/06/2013    "behind my knee" (04/07/2013)   Past Surgical History:  Past Surgical History  Procedure Laterality Date  . Abdominal hysterectomy  1991  . Carpal tunnel release Right 1995  . Axillary lymph node dissection Right 1988  . Wound debridement  09/03/2011    Procedure: DEBRIDEMENT WOUND;  Surgeon: Odis Hollingshead, MD;  Location: [facility]S;  Service: General;  Laterality: Right;  . Breast lumpectomy Right 1988  . Breast surgery Right 10/2011    "cut out calcification" (04/07/2013)  . Cataract extraction w/ intraocular lens implant Right ~ 2006   HPI:  64 yo female adm to [facility] with syncope - diagnosed with CHF.  Pt found to have increased mid lower lung edema/pna = has h/o breast cancer s/p XRT in 1988, PAF, treated for gastroesophageal reflux althought pt denies.  Swallow evaluation ordered.    Assessment / Plan /  Recommendation Clinical Impression  Pt with negative CN exam and no symptoms of airway compromise with all intake observed *cracker, applesauce, water.  Swallow was timely and voice was clear throughout.  Pt even challenged with sequential swallows of water with no airway compromise.  Pt does eat lying back slightly, which will increase her aspiration risk.    Pt denies any reflux symptoms but has a h/o of being treated with pepcid.  Recommend pt follow esophageal precautions given h/o XRT in 1988 for breast cancer and reflux medication tx.   Pt admits to occasional issues with delayed clearance of multiple pills taken at once, advised her to take single pills if able.   Educated pt to compensations for xerostomia given she is on medications that may contribute to xerostomia and she is on oxygen.  Teach back used successfully.     Aspiration Risk  Mild    Diet Recommendation Age appropriate regular solids;Thin   Medication Administration: Whole meds with liquid Compensations: Slow rate;Small sips/bites (start meal with liquids due to xerostomia)    Other  Recommendations Oral Care Recommendations: Oral care BID   Follow Up Recommendations       Frequency and Duration        Pertinent Vitals/Pain Low grade temp      Swallow Study Prior Functional Status  Type of Home: House Available Help at Discharge:  Neighbor    General Date of Onset: 03/11/15 Other Pertinent Information: 64 yo female adm to [facility] with syncope - diagnosed with CHF.  Pt found to have increased mid lower lung edema/pna = has h/o breast cancer s/p XRT in 1988, PAF, treated for gastroesophageal reflux althought pt denies.  Swallow evaluation ordered.  Type of Study: Bedside swallow evaluation Diet Prior to this Study: Regular;Thin liquids Temperature Spikes Noted: No Respiratory Status: Supplemental O2 delivered via (comment) History of Recent Intubation: No Behavior/Cognition: Cooperative;Alert;Pleasant mood Oral  Cavity - Dentition: Adequate natural dentition/normal for age (upper denture) Self-Feeding Abilities: Needs set up Patient Positioning: Partially reclined Baseline Vocal Quality: Normal Volitional Cough: Strong Volitional Swallow: Able to elicit    Oral/Motor/Sensory Function Overall Oral Motor/Sensory Function: Appears within functional limits for tasks assessed   Ice Chips Ice chips: Not tested   Thin Liquid Thin Liquid: Within functional limits Presentation: Straw    Nectar Thick Nectar Thick Liquid: Not tested   Honey Thick Honey Thick Liquid: Not tested   Puree Puree: Within functional limits Presentation: Self Fed;Spoon   Solid   GO    Solid: Within functional limits Presentation: [place], [place] [facility] SLP [phone]

## 2015-03-11 NOTE — Evaluation (Signed)
Physical Therapy Evaluation Patient Details Name: [name] MRN: [id number] DOB: 02-02-1951 Today's Date: 03/11/2015   History of Present Illness  63 y.o. female with a history of PAF, HTN, OSA, Morbid Obesity, Lymphedema, Breast Cancer S/P Lumpectomy, DVT after syncope episode at home and admitted for CHF exacerbation.   Clinical Impression  Pt admitted with above diagnosis. Pt currently with functional limitations due to the deficits listed below (see PT Problem List).  Pt will benefit from skilled PT to increase their independence and safety with mobility to allow discharge to the venue listed below.  Pt reports bedbound at baseline and working on sitting EOB with HHPT.  Pt states she does not have lift at home however she does have a wheelchair.  Pt would benefit from hoyer lift for OOB mobility.  Pt states her neighbor assists with hygiene.  Pt would benefit from assist at home for ADLs (possibly aide) to decrease burden of care for neighbor.     Follow Up Recommendations Home health PT    Equipment Recommendations  Hospital bed;Other (comment) (hoyer lift)    Recommendations for Other Services       Precautions / Restrictions Precautions Precaution Comments: nonambulatory      Mobility  Bed Mobility Overal bed mobility: Needs Assistance;+2 for physical assistance Bed Mobility: Rolling Rolling: Max assist;+2 for physical assistance         General bed mobility comments: pt assisted with rolling to x4 for pericare, changing linen and applying/removing hoyer lift pad, requires max assist to right side however able to assist more to left side (mod assist), 2-3 persons required  Transfers                 General transfer comment: pt unable, recommend hoyer lift for OOB  Ambulation/Gait                Stairs            Wheelchair Mobility    Modified Rankin (Stroke Patients Only)       Balance                                              Pertinent Vitals/Pain Pain Assessment: No/denies pain Pain Score: 2  Faces Pain Scale: Hurts a little bit Pain Location: headache x2 weeks Pain Descriptors / Indicators: [place] expects to be discharged to:: Private residence Living Arrangements: Alone Available Help at Discharge: Neighbor Type of Home: [facility]: One level Home Equipment: Wheelchair - [employer] - 2 wheels      Prior Function Level of Independence: Needs assistance   Gait / Transfers Assistance Needed: pt reports being bedbound for the last 3 years, has not transferred, states she has been doing bed mobility with HHPT  ADL's / Homemaking Assistance Needed: neighbor assist with bathing and pericare        Hand Dominance        Extremity/Trunk Assessment   Upper Extremity Assessment: RUE deficits/detail RUE Deficits / Details: reports and demonstrates decreased shoulder mobility and strength         Lower Extremity Assessment: Generalized weakness (body habitus limiting ROM)         Communication   Communication: No difficulties  Cognition Arousal/Alertness: Awake/alert Behavior During Therapy: WFL for tasks assessed/performed Overall Cognitive Status:  Within Functional Limits for tasks assessed                      General Comments      Exercises        Assessment/Plan    PT Assessment Patient needs continued PT services  PT Diagnosis Generalized weakness   PT Problem List Decreased strength;Decreased activity tolerance;Decreased mobility;Obesity;Cardiopulmonary status limiting activity  PT Treatment Interventions DME instruction;Patient/family education;Functional mobility training;Therapeutic activities;Therapeutic exercise   PT Goals (Current goals can be found in the Care Plan section) Acute Rehab PT Goals PT Goal Formulation: With patient Time For Goal Achievement: 03/25/15 Potential to Achieve Goals:  Good    Frequency Min 2X/week   Barriers to discharge        Co-evaluation               End of Session   Activity Tolerance: Patient tolerated treatment well Patient left: with call bell/phone within reach;in bed;with nursing/sitter in room Nurse Communication: Need for lift equipment         Time: 1112-1140 PT Time Calculation (min) (ACUTE ONLY): 28 min   Charges:   PT Evaluation $Initial PT Evaluation Tier I: 1 Procedure     PT G Codes:        LEMYRE,KATHrine E 03/11/2015, 1:05 PM Carmelia Bake, PT, DPT 03/11/2015 Pager: [phone]

## 2015-03-11 NOTE — Progress Notes (Signed)
Initial Nutrition Assessment  DOCUMENTATION CODES:   Morbid obesity  INTERVENTION:  - Will order Boost Breeze TID, each supplement provides 250 kcal and 9 grams of protein - Will order Prostat BID, each supplement provides 100 kcal and 15 grams of protein - Encourage adequate fluid intake - RD will continue to monitor for needs  NUTRITION DIAGNOSIS:   Inadequate oral intake related to acute illness as evidenced by per patient/family report, meal completion < 50%.  GOAL:   Patient will meet greater than or equal to 90% of their needs  MONITOR:   PO intake, Supplement acceptance, Weight trends, Labs, I & O's  REASON FOR ASSESSMENT:   Low Braden  ASSESSMENT:   64 y.o. female with a history of PAF, HTN, OSA, Morbid Obesity, Lymphedema, [place] a remote history of Breast Cancer S/P Lumpectomy who presents to the ED with complaints of worsening SOB over the past 24 hours and syncope x 2 which occurred after she was repositioned in bed. She reports having a headache and feeling Dizzy before passing out. She was found by EMSs to have O2 sats of 85% and was placed on 4 liters of NCO2. She was discharged from the hospital on 09/15 for Cellulitis of the Left Forearm, and CAP Pneumonia and was discharged to home on Doxycyline Rx for 5 days.   Pt seen for low Braden. BMI indicates morbid obesity. Per chart review, pt ate 100% dinner 9/17. She states that last night (9/18) she ate chicken breast and 1/2 portion of pinto beans. After intake she had diarrhea for multiple episodes. She tried a banana and rice krispies for breakfast with similar result, although not as "bad." She has been drinking fluid such as water, coffee, and orange juice to remain hydrated.  She states that from the time of recent d/c to re-hospitalization she had a good appetite and was eating well at home without much GI upset/issue.   Encouraged pt to drink fluids as able and to try foods as she feels comfortable.   No muscle or fat wasting but moderate edema noted. Review of chart indicates 3 lb weight gain in the past 12 days which may be fluid related.  Not meeting needs. Medications reviewed. Labs reviewed; Cl: 98 mmol/L, creatinine elevated, Ca: 8.8 mg/dL, GFR: 45.   Diet Order:  Diet Heart Room service appropriate?: Yes; Fluid consistency:: Thin  Skin:   Stage 2 L leg pressure ulcer, wounds to neck, bilateral knees, and back.  Last BM:  9/17  Height:   Ht Readings from Last 1 Encounters:  03/09/15 5\' 4"  (1.626 m)    Weight:   Wt Readings from Last 1 Encounters:  02/27/15 400 lb (181.439 kg)    Ideal Body Weight:  54.54 kg (kg)  BMI:  Body mass index is 69.14 kg/(m^2).  Estimated Nutritional Needs:   Kcal:  2000-2200  Protein:  105-115 grams  Fluid:  1.8-2.2 L/day  EDUCATION NEEDS:   No education needs identified at this time     Jarome Matin, RD, LDN Inpatient Clinical Dietitian Pager # [phone] After hours/weekend pager # [phone]

## 2015-03-11 NOTE — Progress Notes (Signed)
  Echocardiogram 2D Echocardiogram Limited with Contrast has been performed.  [name] M 03/11/2015, 2:18 PM

## 2015-03-11 NOTE — Progress Notes (Signed)
[facility] PROGRESS NOTE  Royetta Probus ITG:[id number] DOB: [birth year] DOA: 03/08/2015 PCP: Alvester Chou, NP  Assessment/Plan: Principal Problem:   CHF exacerbation Active Problems:   BMI 70 and over, adult   Stasis dermatitis   Obstructive sleep apnea on CPAP   PAF (paroxysmal atrial fibrillation)   Cellulitis of left forearm   Lymphedema   Syncope   Essential hypertension   Acute diastolic CHF (congestive heart failure)   Faintness   Pressure ulcer   Subjective-still complaining of significant orthopnea, no significant change in her breathing   community-acquired pneumonia Discharged on oral doxycycline on 9/15 Repeat chest x-ray on 9/17 shows atypical infection with increasing mid and lower lung zone edema/pneumonia. Patient still continues to have community-acquired pneumonia Continue Rocephin/azithromycin, day 2, for community-acquired pneumonia for total of 5 days. Speech therapy consultation ordered and pending  Syncope EKG is nonischemic and trop is normal x 3. Qtc was prolonged on EKGs done initially but is normal now. Consider stopping Zoloft and Trazadone. Repeat echo with contrast per cardiology recommendations, CT scan negative for PE  Acute on chronic diastolic heart failure-continue Lasix 60 mg IV every 8, follow renal function, creatinine stable and tolerating diuresis   Severe pulmonary hypertension PA peak pressure 60 mmHg, may need to repeat echo with contra  Morbid  obesity BMI 70 and over,    HTN (hypertension) Continue current antihypertensive therapy and monitor blood pressure.     PAF (paroxysmal atrial fibrillation)-reduced  BYSTOLIC to 2.5 mg given pauses on telemetry. Cardiology consulted for further recommendations about anticoagulation  CHADS2VASC score of 3. Cardiology recommends xarelto   [facility] care consultation ordered  Obstructive sleep apnea on CPAP. CPAP setting is auto 4-16 cmH20  DVT prophylaxsis  Lovenox  Code Status:  Patient confirmed that she is a DO NOT RESUSCITATE DO NOT INTUBATE    Code Status Orders        Start     Ordered   03/10/15 1012  Do not attempt resuscitation (DNR)   Continuous    Question Answer Comment  In the event of cardiac or respiratory ARREST Do not call a "code blue"   In the event of cardiac or respiratory ARREST Do not perform Intubation, CPR, defibrillation or ACLS   In the event of cardiac or respiratory ARREST Use medication by any route, position, wound care, and other measures to relive pain and suffering. May use oxygen, suction and manual treatment of airway obstruction as needed for comfort.      03/10/15 1012     Family Communication: No family by the bedside Disposition Plan: Anticipate discharge in one to 2 days, physical therapy evaluation pending   Brief narrative: Aila Terra is a 64 y.o. female with a history of PAF, HTN, OSA, Morbid Obesity, Lymphedema, [place] a remote history of Breast Cancer S/P Lumpectomy who presents to the ED with complaints of worsening SOB over the past 24 hours and syncope x 2 which occurred after she was repositioned in bed. She reports having a headache and feeling Dizzy before passing out. She was found by EMSs to have O2 sats of 85% and was placed on 4 liters of NCO2. She was discharged from the hospital on 09/15 for Cellulitis of the Left Forearm, and CAP Pneumonia and was discharged to home on Doxycyline Rx for 5 days.   In the ED, she was found to have pulmonary edema on chest X-Ray and was initially administered 40 mg of IV Lasix  x 1 and referred for admission.   Consultants:  Cardiology  Procedures:  None  Antibiotics: Anti-infectives    Start     Dose/Rate Route Frequency Ordered Stop   03/10/15 1800  azithromycin (ZITHROMAX) 500 mg in dextrose 5 % 250 mL IVPB     500 mg 250 mL/hr over 60 Minutes Intravenous Every 24 hours 03/10/15 1139     03/10/15 1200  cefTRIAXone  (ROCEPHIN) 1 g in dextrose 5 % 50 mL IVPB     1 g 100 mL/hr over 30 Minutes Intravenous Every 24 hours 03/10/15 1139     03/09/15 1000  doxycycline (VIBRA-TABS) tablet 100 mg  Status:  Discontinued     100 mg Oral Every 12 hours 03/09/15 0500 03/10/15 1139        Objective: Filed Vitals:   03/10/15 0633 03/10/15 1352 03/10/15 2123 03/11/15 0601  BP: 126/59 110/81 101/38 115/81  Pulse: 71 88 74 81  Temp: 98.2 F (36.8 C) 98.4 F (36.9 C) 98.4 F (36.9 C) 99 F (37.2 C)  TempSrc: Axillary Axillary Oral Oral  Resp: 22 20 20 20   Height:      Weight: 182.8 kg (403 lb)     SpO2: 97% 99% 99% 100%    Intake/Output Summary (Last 24 hours) at 03/11/15 1017 Last data filed at 03/11/15 0900  Gross per 24 hour  Intake    780 ml  Output   4775 ml  Net  -3995 ml    Exam:  General appearance: alert, cooperative and no distress Lungs: Some rhonchi anteriorly Heart: irregularly irregular rhythm Abdomen: +BS, tender. Extremities: Trace LEE Pulses: 2+ and symmetric Skin: Warm and dry Data Review   Micro Results Recent Results (from the past 240 hour(s))  C difficile quick scan w PCR reflex     Status: Abnormal   Collection Time: 03/04/15 11:39 AM  Result Value Ref Range Status   C Diff antigen POSITIVE (A) NEGATIVE Final   C Diff toxin NEGATIVE NEGATIVE Final   C Diff interpretation   Final    C. difficile present, but toxin not detected. This indicates colonization. In most cases, this does not require treatment. If patient has signs and symptoms consistent with colitis, consider treatment.    Radiology Reports Dg Chest 2 View  02/27/2015   CLINICAL DATA:  Persistent cough for several months  EXAM: CHEST - 2 VIEW  COMPARISON:  03/08/2013  FINDINGS: Cardiac shadow is within normal limits. Increased density is noted along the medial aspect of the right lung base. A portion of this is related to the patient's body habitus although an early infiltrate cannot be excluded. The lateral  images are of little utility due to the patient's body habitus.  IMPRESSION: Changes suggestive of early infiltrate in the medial right lung base.   Electronically Signed   By: Inez Catalina M.D.   On: 02/27/2015 17:03   Ct Angio Chest Pe W/cm &/or Wo Cm  03/10/2015   CLINICAL DATA:  Progressive shortness of breath. Recent pneumonia. Distant history of breast carcinoma.  EXAM: CT ANGIOGRAPHY CHEST WITH CONTRAST  TECHNIQUE: Multidetector CT imaging of the chest was performed using the standard protocol during bolus administration of intravenous contrast. Multiplanar CT image reconstructions and MIPs were obtained to evaluate the vascular anatomy.  CONTRAST:  144mL OMNIPAQUE IOHEXOL 350 MG/ML SOLN  COMPARISON:  Chest radiograph, 03/09/2015  FINDINGS: Angiographic study: Exam somewhat limited by respiratory motion. Allowing for this, there is no evidence of a pulmonary  embolus. Great vessels normal in caliber. No aortic dissection.  Thoracic inlet:  No mass or adenopathy.  Mediastinum and hila: Mild enlargement of the heart. Mild coronary artery calcifications. No mediastinal or hilar masses or pathologically enlarged lymph nodes.  Lungs and pleura: Moderate right and small left pleural effusions. Multiple areas of hazy ground-glass opacity with associated coarse reticular and linear opacity. Mild interstitial thickening seen more diffusely. No pneumothorax. No lung mass or suspicious nodule.  Limited upper abdomen:  No acute finding.  Musculoskeletal:  No osteoblastic or osteolytic lesions.  Review of the MIP images confirms the above findings.  IMPRESSION: 1. No evidence of a pulmonary embolus. 2. Findings consistent with congestive heart failure with cardiomegaly, moderate right and small left pleural effusions interstitial thickening. There is also linear and reticular opacity areas of hazy airspace opacity which may be due to atelectasis and small airways disease. Infection is possible.   Electronically Signed    By: Lajean Manes M.D.   On: 03/10/2015 17:50   Dg Chest Port 1 View  03/09/2015   CLINICAL DATA:  Repeated syncopal episodes. History of morbid obesity, hypertension, RIGHT breast cancer, chronic bronchitis, shortness of breath, RIGHT leg DVT.  EXAM: PORTABLE CHEST - 1 VIEW  COMPARISON:  Chest radiograph February 27, 2015  FINDINGS: The cardiac silhouette is mildly enlarged, unchanged. Calcified aortic knob. Diffuse interstitial prominence, patchy mid and lower lung zone alveolar airspace opacities with air bronchograms. No pleural effusion. No pneumothorax. RIGHT chest wall surgical clips and coarse calcifications projecting RIGHT breast. Severe degenerative change of the shoulders, widened RIGHT AC joint suggests old separation.  IMPRESSION: Stable cardiomegaly. Interstitial prominence could represent pulmonary edema or atypical infection with increasing mid and lower lung zone confluent edema or pneumonia. Recommend after treatment to verify improvement.   Electronically Signed   By: Elon Alas M.D.   On: 03/09/2015 01:07     CBC  Recent Labs Lab 03/05/15 0635 03/09/15 0010 03/10/15 0510 03/11/15 0511  WBC 8.5 6.8 7.0 9.1  HGB 12.2 12.0 10.4* 13.0  HCT 38.9 38.4 34.9* 43.0  PLT 185 245 193 189  MCV 101.0* 101.3* 103.9* 106.7*  MCH 31.7 31.7 31.0 32.3  MCHC 31.4 31.3 29.8* 30.2  RDW 15.4 16.0* 16.0* 16.3*  LYMPHSABS  --  1.0  --   --   MONOABS  --  0.3  --   --   EOSABS  --  0.1  --   --   BASOSABS  --  0.0  --   --     Chemistries   Recent Labs Lab 03/05/15 0635 03/06/15 0810 03/09/15 0010 03/10/15 0510 03/11/15 0511  NA 137  --  139 143 138  K 4.4  --  3.9 3.5 4.2  CL 107  --  106 106 98*  CO2 26  --  27 33* 30  GLUCOSE 99  --  109* 88 84  BUN 7  --  9 10 10   CREATININE 0.86 0.90 0.99 1.11* 1.22*  CALCIUM 8.6*  --  8.9 8.6* 8.8*  AST  --   --  22 20 22   ALT  --   --  10* 11* 14  ALKPHOS  --   --  64 56 64  BILITOT  --   --  0.8 0.5 0.6    ------------------------------------------------------------------------------------------------------------------ estimated creatinine clearance is 75.8 mL/min (by C-G formula based on Cr of 1.22). ------------------------------------------------------------------------------------------------------------------ No results for input(s): HGBA1C in the last 72 hours. ------------------------------------------------------------------------------------------------------------------ No  results for input(s): CHOL, HDL, LDLCALC, TRIG, CHOLHDL, LDLDIRECT in the last 72 hours. ------------------------------------------------------------------------------------------------------------------  Recent Labs  03/09/15 1150  TSH 1.886   ------------------------------------------------------------------------------------------------------------------ No results for input(s): VITAMINB12, FOLATE, FERRITIN, TIBC, IRON, RETICCTPCT in the last 72 hours.  Coagulation profile No results for input(s): INR, PROTIME in the last 168 hours.   Recent Labs  03/10/15 1459  DDIMER 2.30*    Cardiac Enzymes  Recent Labs Lab 03/09/15 1125 03/09/15 1700 03/09/15 2330  TROPONINI <0.03 <0.03 <0.03   ------------------------------------------------------------------------------------------------------------------ Invalid input(s): POCBNP   CBG: No results for input(s): GLUCAP in the last 168 hours.     Studies: Ct Angio Chest Pe W/cm &/or Wo Cm  03/10/2015   CLINICAL DATA:  Progressive shortness of breath. Recent pneumonia. Distant history of breast carcinoma.  EXAM: CT ANGIOGRAPHY CHEST WITH CONTRAST  TECHNIQUE: Multidetector CT imaging of the chest was performed using the standard protocol during bolus administration of intravenous contrast. Multiplanar CT image reconstructions and MIPs were obtained to evaluate the vascular anatomy.  CONTRAST:  178mL OMNIPAQUE IOHEXOL 350 MG/ML SOLN  COMPARISON:  Chest  radiograph, 03/09/2015  FINDINGS: Angiographic study: Exam somewhat limited by respiratory motion. Allowing for this, there is no evidence of a pulmonary embolus. Great vessels normal in caliber. No aortic dissection.  Thoracic inlet:  No mass or adenopathy.  Mediastinum and hila: Mild enlargement of the heart. Mild coronary artery calcifications. No mediastinal or hilar masses or pathologically enlarged lymph nodes.  Lungs and pleura: Moderate right and small left pleural effusions. Multiple areas of hazy ground-glass opacity with associated coarse reticular and linear opacity. Mild interstitial thickening seen more diffusely. No pneumothorax. No lung mass or suspicious nodule.  Limited upper abdomen:  No acute finding.  Musculoskeletal:  No osteoblastic or osteolytic lesions.  Review of the MIP images confirms the above findings.  IMPRESSION: 1. No evidence of a pulmonary embolus. 2. Findings consistent with congestive heart failure with cardiomegaly, moderate right and small left pleural effusions interstitial thickening. There is also linear and reticular opacity areas of hazy airspace opacity which may be due to atelectasis and small airways disease. Infection is possible.   Electronically Signed   By: Lajean Manes M.D.   On: 03/10/2015 17:50      Lab Results  Component Value Date   HGBA1C 5.8* 06/04/2012   HGBA1C 6.2* 07/02/2011   Lab Results  Component Value Date   LDLCALC  09/09/2010    71        Total Cholesterol/HDL:CHD Risk Coronary Heart Disease Risk Table                     Men   Women  1/2 Average Risk   3.4   3.3  Average Risk       5.0   4.4  2 X Average Risk   9.6   7.1  3 X Average Risk  23.4   11.0        Use the calculated Patient Ratio above and the CHD Risk Table to determine the patient's CHD Risk.        ATP III CLASSIFICATION (LDL):  <100     mg/dL   Optimal  100-129  mg/dL   Near or Above                    Optimal  130-159  mg/dL   Borderline  160-189  mg/dL    High  >190  mg/dL   Very High   CREATININE 1.22* 03/11/2015       Scheduled Meds: . antiseptic oral rinse  7 mL Mouth Rinse BID  . aspirin EC  81 mg Oral Daily  . azithromycin  500 mg Intravenous Q24H  . cefTRIAXone (ROCEPHIN)  IV  1 g Intravenous Q24H  . enoxaparin (LOVENOX) injection  80 mg Subcutaneous Q24H  . famotidine  20 mg Oral BID  . furosemide  60 mg Intravenous 3 times per day  . gabapentin  300 mg Oral QHS  . nebivolol  2.5 mg Oral Daily  . saccharomyces boulardii  250 mg Oral BID  . sertraline  50 mg Oral QHS  . sodium chloride  3 mL Intravenous Q12H   Continuous Infusions:   Principal Problem:   CHF exacerbation Active Problems:   BMI 70 and over, adult   Stasis dermatitis   Obstructive sleep apnea on CPAP   PAF (paroxysmal atrial fibrillation)   Cellulitis of left forearm   Lymphedema   Syncope   Essential hypertension   Acute diastolic CHF (congestive heart failure)   Faintness   Pressure ulcer    Time spent: 45 minutes   [facility] Pager [phone]. If 7PM-7AM, please contact night-coverage at [email or web address], password [facility] 03/11/2015, 10:17 AM  LOS: 2 days

## 2015-03-11 NOTE — Care Management Important Message (Signed)
Important Message  Patient Details  Name: [name] MRN: [id number] Date of Birth: Mar 27, 1951   Medicare Important Message Given:  Yes-fourth notification given    Camillo Flaming 03/11/2015, 11:23 [place] Message  Patient Details  Name: Eiza Canniff MRN: [id number] Date of Birth: [birth year]   Medicare Important Message Given:  Yes-fourth notification given    Camillo Flaming 03/11/2015, 11:22 AM

## 2015-03-11 NOTE — Progress Notes (Signed)
ANTICOAGULATION CONSULT NOTE - Initial Consult  Pharmacy Consult for Rivaroxaban Indication: atrial fibrillation  Allergies  Allergen Reactions  . Effexor [Venlafaxine Hydrochloride] Anxiety    Made her want to kill herself    Patient Measurements: Height: 5\' 4"  (162.6 cm) Weight:  (bed scale not working accurately) IBW/kg (Calculated) : 54.7   Vital Signs: Temp: 99 F (37.2 C) (09/19 0601) Temp Source: Oral (09/19 0601) BP: 115/81 mmHg (09/19 0601) Pulse Rate: 81 (09/19 0601)  Labs:  Recent Labs  03/09/15 0010 03/09/15 1125 03/09/15 1700 03/09/15 2330 03/10/15 0510 03/11/15 0511  HGB 12.0  --   --   --  10.4* 13.0  HCT 38.4  --   --   --  34.9* 43.0  PLT 245  --   --   --  193 189  CREATININE 0.99  --   --   --  1.11* 1.22*  TROPONINI  --  <0.03 <0.03 <0.03  --   --     Estimated Creatinine Clearance: 75.8 mL/min (by C-G formula based on Cr of 1.22).   Medical History: Past Medical History  Diagnosis Date  . Obesities, morbid   . Hypertension   . Cellulitis     right breast, right arm, ble  . Depression   . Anxiety   . 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 Breast cancer 1988  . Dysrhythmia   . PAF (paroxysmal atrial fibrillation) 06/04/2012  . Pneumonia     "several times; last time was in the 1990's" (04/07/2013)  . Chronic bronchitis     "maybe 4 years in a row; last time was ~1990" (04/07/2013)  . Obstructive sleep apnea on CPAP 06/04/2012  . Exertional shortness of breath   . Arthritis     "knees; fingers/hands" (04/07/2013)  . Stasis dermatitis   . Right leg DVT 04/06/2013    "behind my knee" (04/07/2013)     Assessment: Patient is a 64 y/o female with history of paroxysmal A Fib and DVT, currently admitted for shortness of breath and syncope.  Patient not on chronic anticoagulation due to fall risk.  Pharmacy consulted to start Xarelto  for stroke prevention in AFib.  CHADS2VASc score of 4 indicates need for anticoagulation (CHF, HTN, Age 71, Female).  Cardiology recommends starting Xarelto, which patient has used in the past for DVT treatment.  Ultrasound of lower extremities and CT Chest are negative for VTE on this admission.  Patient is morbidly obese with Wt of 182.8 kg and BMI > 70.  CrCl = 154, calculated by Cockroft-Gault using total body weight (as done in Xarelto trials).  No dose adjustment recommended due to weight.    Please note, there is limited data of use of DOACs in extreme obesity.  The International Society on Thrombosis and Haemostasis published 2016 guidelines suggest against the use of Rivaroxaban (and other DOACs) in patients with Wt > 120kg or BMI >40 due to lack of clinical data.    Goal of Therapy:  Monitor platelets by anticoagulation protocol: Yes Reduce risk of stroke in patient with atrial fibrillation  Plan:  --Initiate Rivaroxaban 20 mg PO once daily with evening meal --Provide Rivaroxaban education --Monitor CBC and signs of bleeding --Watch rising SCr   Viann Fish 03/11/2015,12:13 PM

## 2015-03-11 NOTE — Progress Notes (Signed)
Subjective: Breathing is the same.  Worse when laying flat and feet are up  Objective: Vital signs in last 24 hours: Temp:  [98.4 F (36.9 C)-99 F (37.2 C)] 99 F (37.2 C) (09/19 0601) Pulse Rate:  [74-88] 81 (09/19 0601) Resp:  [20] 20 (09/19 0601) BP: (101-115)/(38-81) 115/81 mmHg (09/19 0601) SpO2:  [99 %-100 %] 100 % (09/19 0601) Last BM Date: 03/09/15  Intake/Output from previous day: 09/18 0701 - 09/19 0700 In: 780 [P.O.:480; IV Piggyback:300] Out: 3300 [Urine:3300] Intake/Output this shift:    Medications Scheduled Meds: . antiseptic oral rinse  7 mL Mouth Rinse BID  . aspirin EC  81 mg Oral Daily  . azithromycin  500 mg Intravenous Q24H  . cefTRIAXone (ROCEPHIN)  IV  1 g Intravenous Q24H  . enoxaparin (LOVENOX) injection  80 mg Subcutaneous Q24H  . famotidine  20 mg Oral BID  . furosemide  60 mg Intravenous 3 times per day  . gabapentin  300 mg Oral QHS  . nebivolol  2.5 mg Oral Daily  . saccharomyces boulardii  250 mg Oral BID  . sertraline  50 mg Oral QHS  . sodium chloride  3 mL Intravenous Q12H   Continuous Infusions:  PRN Meds:.sodium chloride, acetaminophen, iohexol, LORazepam, ondansetron (ZOFRAN) IV, sodium chloride, traZODone  PE: General appearance: alert, cooperative and no distress Lungs: Some rhonchi anteriorly Heart: irregularly irregular rhythm Abdomen: +BS, tender. Extremities: Trace LEE Pulses: 2+ and symmetric Skin: Warm and dry Neurologic: Grossly normal  Lab Results:   Recent Labs  03/09/15 0010 03/10/15 0510 03/11/15 0511  WBC 6.8 7.0 9.1  HGB 12.0 10.4* 13.0  HCT 38.4 34.9* 43.0  PLT 245 193 189   BMET  Recent Labs  03/09/15 0010 03/10/15 0510 03/11/15 0511  NA 139 143 138  K 3.9 3.5 4.2  CL 106 106 98*  CO2 27 33* 30  GLUCOSE 109* 88 84  BUN 9 10 10   CREATININE 0.99 1.11* 1.22*  CALCIUM 8.9 8.6* 8.8*     Assessment/Plan 64 y.o. female with a history of PAF, HTN, OSA, Morbid Obesity, Lymphedema and a  remote history of Breast Cancer S/P Lumpectomy who presents to the ED with complaints of worsening SOB over the past 24 hours and syncope x 2 which occurred after she was repositioned in bed.  Principal Problem:   CHF exacerbation Active Problems:   BMI 70 and over, adult   Stasis dermatitis   Obstructive sleep apnea on CPAP   PAF (paroxysmal atrial fibrillation)   Cellulitis of left forearm   Lymphedema   Syncope   Essential hypertension   Acute diastolic CHF (congestive heart failure)   Faintness   Pressure ulcer  1. Syncope of ? Etiology.  EKG is nonischemic and trop is normal x 3. Qtc was prolonged on EKGs done initially but is normal now. Consider stopping Zoloft and Trazadone. Need to repeat echo with definity contrast to get better assessment of LVF. She does have pulmonary HTN but only moderate so doubt this is etiology of syncope unless she had a PE. She was noted to be hypoxic on arrival and developed acute SOB after the very brief episode of chest pain. She is a set up for acute DVT (prior DVT 2014) and PE given morbid obesity and had acute hypoxia on admission. She is not on any oral anticoagulants.   LE venous dopplers were negative for DVT.  D-Dimer 2.03.  CT angio chest negative for PE.   2.  Moderate pulmonary HTN -  most likely secondary to morbid obesity with obesity hypoventilation syndrome and OSA.   3. HTN - controlled on BB.   4. H/O PAF  Currently in afib on no anticoagulation. Rate well controlled.  Anticoagulation had been recommended several years ago but she declined due to history of rectal bleeding. She had been on xarelto for a short period for DVT 03/2013. She has a CHADS2VASC score of 3 so recommendations are for long term anticoagulation for stroke prevention. This has been discussed with the patient but she is very leary given history of rectal bleeding a year ago. She seems willing to try.   If IM cannot find any contraindication, it was  recommended to start Xarelto. BB dose decreased due to some episodes of bradycardia but these were mainly during sleep. She has had some pauses but all less than 2 seconds in duration. Monitor on tele.  5. Acute on chronic diastolic CHF -  Echo:  Poor quality.  EF normal.  Peak PA pressure 32mmHg.  Mildly elevated BNP and CXRAY with increased interstitial prominence. net fluids:  -2.5L/-5.7L.  Mod right and small pleural effusions on CT yesterday.  Lasix given this morning(60mg  IV Q8).  Volume status impossible to determine given body habitus.  Follow BNP and CXR.   Slight increase in SCr.    6. Atypical CP at rest ? Etiology. Ruled out for MI.  LVF normal.    7. Community acquired PNA  On azithromycin, rocephin   LOS: 2 days    HAGER, BRYAN PA-C 03/11/2015 8:27 AM  Patient examined chart reviewed.  Primary issue is obesity with OHS.  Agree with anticoagulation and xarelto Daily is most convenient.  Start if patient willing  No signs of acute lung injury/PE with negative LE duplex and CTA  Jenkins Rouge

## 2015-03-12 LAB — CBC
HCT: 34 % — ABNORMAL LOW (ref 36.0–46.0)
HEMOGLOBIN: 10.5 g/dL — AB (ref 12.0–15.0)
MCH: 31.9 pg (ref 26.0–34.0)
MCHC: 30.9 g/dL (ref 30.0–36.0)
MCV: 103.3 fL — AB (ref 78.0–100.0)
Platelets: 160 10*3/uL (ref 150–400)
RBC: 3.29 MIL/uL — AB (ref 3.87–5.11)
RDW: 15.8 % — ABNORMAL HIGH (ref 11.5–15.5)
WBC: 6.8 10*3/uL (ref 4.0–10.5)

## 2015-03-12 LAB — COMPREHENSIVE METABOLIC PANEL
ALK PHOS: 54 U/L (ref 38–126)
ALT: 10 U/L — AB (ref 14–54)
AST: 14 U/L — ABNORMAL LOW (ref 15–41)
Albumin: 2.5 g/dL — ABNORMAL LOW (ref 3.5–5.0)
Anion gap: 8 (ref 5–15)
BUN: 11 mg/dL (ref 6–20)
CALCIUM: 8.2 mg/dL — AB (ref 8.9–10.3)
CO2: 36 mmol/L — AB (ref 22–32)
CREATININE: 1.11 mg/dL — AB (ref 0.44–1.00)
Chloride: 94 mmol/L — ABNORMAL LOW (ref 101–111)
GFR, EST AFRICAN AMERICAN: 59 mL/min — AB (ref >60)
GFR, EST NON AFRICAN AMERICAN: 51 mL/min — AB (ref >60)
Glucose, Bld: 93 mg/dL (ref 65–99)
Potassium: 2.9 mmol/L — ABNORMAL LOW (ref 3.5–5.1)
SODIUM: 138 mmol/L (ref 135–145)
Total Bilirubin: 0.6 mg/dL (ref 0.3–1.2)
Total Protein: 6.7 g/dL (ref 6.5–8.1)

## 2015-03-12 MED ORDER — POTASSIUM CHLORIDE CRYS ER 20 MEQ PO TBCR
40.0000 meq | EXTENDED_RELEASE_TABLET | Freq: Three times a day (TID) | ORAL | Status: DC
  Administered 2015-03-12 – 2015-03-13 (×3): 40 meq via ORAL
  Filled 2015-03-12 (×3): qty 2

## 2015-03-12 MED ORDER — AZITHROMYCIN 250 MG PO TABS
500.0000 mg | ORAL_TABLET | ORAL | Status: DC
  Administered 2015-03-12: 500 mg via ORAL
  Filled 2015-03-12: qty 2

## 2015-03-12 MED ORDER — POTASSIUM CHLORIDE CRYS ER 20 MEQ PO TBCR
40.0000 meq | EXTENDED_RELEASE_TABLET | Freq: Once | ORAL | Status: AC
  Administered 2015-03-12: 40 meq via ORAL
  Filled 2015-03-12: qty 2

## 2015-03-12 NOTE — Progress Notes (Signed)
Patient Name: [name] Date of Encounter: 03/12/2015  Principal Problem:   CHF exacerbation Active Problems:   BMI 70 and over, adult   Stasis dermatitis   Obstructive sleep apnea on CPAP   PAF (paroxysmal atrial fibrillation)   Cellulitis of left forearm   Lymphedema   Syncope   Essential hypertension   Acute diastolic CHF (congestive heart failure)   Faintness   Pressure ulcer   Primary Cardiologist: New  Patient Profile: 64 y.o. female w/ PMH of PAF, HTN, OSA, Morbid Obesity, Lymphedema, and a remote history of Breast Cancer (S/P Lumpectomy) admitted on 03/09/2015 for worsening shortness of breath and syncope.  SUBJECTIVE: Reports her breathing has improved. She reports one episode of sternal chest pain that occurred yesterday afternoon, lasting for 2-3 minutes and was tender to touch. No recurrence of symptoms since.  OBJECTIVE Filed Vitals:   03/11/15 0601 03/11/15 1943 03/11/15 2020 03/12/15 0519  BP: 115/81  102/46 108/55  Pulse: 81  71 72  Temp: 99 F (37.2 C)  98.7 F (37.1 C) 97.5 F (36.4 C)  TempSrc: Oral  Oral Oral  Resp: 20  19 17   Height:      Weight:  379 lb (171.913 kg)  374 lb 14.4 oz (170.054 kg)  SpO2: 100%  99% 97%    Intake/Output Summary (Last 24 hours) at 03/12/15 0816 Last data filed at 03/12/15 8676  Gross per 24 hour  Intake    300 ml  Output   5950 ml  Net  -5650 ml   Filed Weights   03/11/15 1943 03/12/15 0519  Weight: 379 lb (171.913 kg) 374 lb 14.4 oz (170.054 kg)    PHYSICAL EXAM General: Obese, Caucasian, female in no acute distress. Head: Normocephalic, atraumatic.  Neck: Supple without bruits, JVD not able to be assessed due to body habitus. Lungs:  Resp regular and unlabored, Crackles present at bases bilaterally. Heart: Irregularly irregular, S1, S2, no S3, S4, or murmur; no rub. Abdomen: Soft, non-tender, non-distended with normoactive bowel sounds. No hepatomegaly. No rebound/guarding. No obvious abdominal  masses. Extremities: No clubbing or cyanosis, trace edema. Distal pedal pulses are 2+ bilaterally. Neuro: Alert and oriented X 3. Moves all extremities spontaneously. Psych: Normal affect.   LABS: CBC: Recent Labs  03/11/15 0511 03/12/15 0504  WBC 9.1 6.8  HGB 13.0 10.5*  HCT 43.0 34.0*  MCV 106.7* 103.3*  PLT 189 720   Basic Metabolic Panel: Recent Labs  03/11/15 0511 03/12/15 0504  NA 138 138  K 4.2 2.9*  CL 98* 94*  CO2 30 36*  GLUCOSE 84 93  BUN 10 11  CREATININE 1.22* 1.11*  CALCIUM 8.8* 8.2*   Liver Function Tests: Recent Labs  03/11/15 0511 03/12/15 0504  AST 22 14*  ALT 14 10*  ALKPHOS 64 54  BILITOT 0.6 0.6  PROT 8.2* 6.7  ALBUMIN 3.0* 2.5*   Cardiac Enzymes: Recent Labs  03/09/15 1125 03/09/15 1700 03/09/15 2330  TROPONINI <0.03 <0.03 <0.03   BNP:  B NATRIURETIC PEPTIDE  Date/Time Value Ref Range Status  03/09/2015 12:27 AM 406.0* 0.0 - 100.0 pg/mL Final   D-dimer: Recent Labs  03/10/15 1459  DDIMER 2.30*   Thyroid Function Tests: Recent Labs  03/09/15 1150  TSH 1.886   TELE:  Atrial fibrillation with rate in 60's - 70's.        ECHO: 03/11/2015 Study Conclusions - Left ventricle: The cavity size was normal. Wall thickness was increased in a pattern of  mild LVH. Systolic function was normal. The estimated ejection fraction was in the range of 55% to 60%. Wall motion was normal; there were no regional wall motion abnormalities.  Impressions: - Technically difficult; definity used; limited images obtained for LV function (no doppler performed); normal LV function; mild LVH.  Radiology/Studies: Ct Angio Chest Pe W/cm &/or Wo Cm: 03/10/2015   CLINICAL DATA:  Progressive shortness of breath. Recent pneumonia. Distant history of breast carcinoma.  EXAM: CT ANGIOGRAPHY CHEST WITH CONTRAST  TECHNIQUE: Multidetector CT imaging of the chest was performed using the standard protocol during bolus administration of intravenous  contrast. Multiplanar CT image reconstructions and MIPs were obtained to evaluate the vascular anatomy.  CONTRAST:  146mL OMNIPAQUE IOHEXOL 350 MG/ML SOLN  COMPARISON:  Chest radiograph, 03/09/2015  FINDINGS: Angiographic study: Exam somewhat limited by respiratory motion. Allowing for this, there is no evidence of a pulmonary embolus. Great vessels normal in caliber. No aortic dissection.  Thoracic inlet:  No mass or adenopathy.  Mediastinum and hila: Mild enlargement of the heart. Mild coronary artery calcifications. No mediastinal or hilar masses or pathologically enlarged lymph nodes.  Lungs and pleura: Moderate right and small left pleural effusions. Multiple areas of hazy ground-glass opacity with associated coarse reticular and linear opacity. Mild interstitial thickening seen more diffusely. No pneumothorax. No lung mass or suspicious nodule.  Limited upper abdomen:  No acute finding.  Musculoskeletal:  No osteoblastic or osteolytic lesions.  Review of the MIP images confirms the above findings.  IMPRESSION: 1. No evidence of a pulmonary embolus. 2. Findings consistent with congestive heart failure with cardiomegaly, moderate right and small left pleural effusions interstitial thickening. There is also linear and reticular opacity areas of hazy airspace opacity which may be due to atelectasis and small airways disease. Infection is possible.   Electronically Signed   By: Lajean Manes M.D.   On: 03/10/2015 17:50     Current Medications:  . antiseptic oral rinse  7 mL Mouth Rinse BID  . azithromycin  500 mg Intravenous Q24H  . cefTRIAXone (ROCEPHIN)  IV  1 g Intravenous Q24H  . famotidine  20 mg Oral BID  . feeding supplement  1 Container Oral TID BM  . feeding supplement (PRO-STAT SUGAR FREE 64)  30 mL Oral BID  . furosemide  60 mg Intravenous 3 times per day  . gabapentin  300 mg Oral QHS  . nebivolol  2.5 mg Oral Daily  . rivaroxaban  20 mg Oral Q supper  . saccharomyces boulardii  250 mg  Oral BID  . sertraline  50 mg Oral QHS  . sodium chloride  3 mL Intravenous Q12H      ASSESSMENT AND PLAN:  1. Syncope - EKG is nonischemic and trop is normal x 3. - LE Dopplers negative for DVT. CTA on 03/10/2015 negative for PE.  2. Moderate pulmonary HTN - most likely secondary to morbid obesity with obesity hypoventilation syndrome and OSA.   3. HTN - BP has been 102/46 - 108/55 in the past 24 hours - continue BB  4. H/O PAF  - She has a CHADS2VASC score of 3 so recommendations are for long term anticoagulation for stroke prevention.Anticoagulation had been recommended several years ago but she declined due to history of rectal bleeding.  - Xarelto started on 03/11/2015. - rate controlled with BB.  5. Acute on chronic diastolic CHF  - BNP mildly elevated to 406 on 0917/2016. - Has been receiving Lasix 60mg  IV TID. Net output  of -11.3L (-5.6L on 03/11/2015). Continue diuresis.   6. Atypical CP  - cyclic troponin values were negative - LV Function normal on echo, although technically difficult study due to body habitus  7. Community Acquired Pneumonia - On Azithromycin and Rocephin - per IM  8. Hypokalemia - Potassium 2.9 on 03/12/2015. - will replete and continue to monitor BMET daily.   Signed, Erma Heritage , PA-C 8:16 AM 03/12/2015 Pager: [phone]  Patient examined chart reviewed See note from yesterday Afib with good rate control  Started on xarelto yesterday CHADVASC 3 Given normal EF and morbid obesity suspect rate control and anticogulation Will be best option for her.

## 2015-03-12 NOTE — Discharge Instructions (Signed)

## 2015-03-12 NOTE — Progress Notes (Signed)
[facility] PROGRESS NOTE  [name] PPI:[id number] DOB: 1950/08/26 DOA: 03/08/2015 PCP: Alvester Chou, NP  Assessment/Plan: Principal Problem:   CHF exacerbation Active Problems:   BMI 70 and over, adult   Stasis dermatitis   Obstructive sleep apnea on CPAP   PAF (paroxysmal atrial fibrillation)   Cellulitis of left forearm   Lymphedema   Syncope   Essential hypertension   Acute diastolic CHF (congestive heart failure)   Faintness   Pressure ulcer   Subjective-brisk diuresis overnight of 5940, shortness of breath is improving  Assessment and plan   community-acquired pneumonia Discharged on oral doxycycline on 9/15 Repeat chest x-ray on 9/17 shows atypical infection with increasing mid and lower lung zone edema/pneumonia. Patient still continues to have community-acquired pneumonia Continue Rocephin/azithromycin, day 3, for community-acquired pneumonia for total of 5 days. Speech therapy recommends regular diet with thin liquids  Syncope EKG is nonischemic and trop is normal x 3. Qtc was prolonged on EKGs done initially but is normal now. Discontinued Zoloft and Trazadone. Repeat echo with contrast shows EF of 55-60%, LVH, CT scan negative for PE  Acute on chronic diastolic heart failure-continue Lasix 60 mg IV every 8, follow renal function, creatinine stable and patient fortunately tolerating diuresis. Net output of -11.3L (-5.6L on 03/11/2015  Moderate pulmonary hypertension/obesity hypoventilation syndrome PA peak pressure 60 mmHg, cardiology consulted  Morbid  obesity BMI 70 and over,   Hypokalemia-replete   HTN (hypertension) Continue current antihypertensive therapy and monitor blood pressure.     PAF (paroxysmal atrial fibrillation)-reduced  BYSTOLIC to 2.5 mg given pauses on telemetry. Cardiology consulted for further recommendations about anticoagulation  CHADS2VASC score of 3. Cardiology recommends xarelto which was  initiated   [facility] care consultation ordered  Obstructive sleep apnea on CPAP. CPAP setting is auto 4-16 cmH20  DVT prophylaxsis Lovenox  Code Status:  Patient confirmed that she is a DO NOT RESUSCITATE DO NOT INTUBATE    Code Status Orders        Start     Ordered   03/10/15 1012  Do not attempt resuscitation (DNR)   Continuous    Question Answer Comment  In the event of cardiac or respiratory ARREST Do not call a "code blue"   In the event of cardiac or respiratory ARREST Do not perform Intubation, CPR, defibrillation or ACLS   In the event of cardiac or respiratory ARREST Use medication by any route, position, wound care, and other measures to relive pain and suffering. May use oxygen, suction and manual treatment of airway obstruction as needed for comfort.      03/10/15 1012     Family Communication: No family by the bedside Disposition Plan: Anticipate discharge in one to 2 days, physical therapy evaluation pending   Brief narrative: [name] is a 64 y.o. female with a history of PAF, HTN, OSA, Morbid Obesity, Lymphedema, [place] a remote history of Breast Cancer S/P Lumpectomy who presents to the ED with complaints of worsening SOB over the past 24 hours and syncope x 2 which occurred after she was repositioned in bed. She reports having a headache and feeling Dizzy before passing out. She was found by EMSs to have O2 sats of 85% and was placed on 4 liters of NCO2. She was discharged from the hospital on 09/15 for Cellulitis of the Left Forearm, and CAP Pneumonia and was discharged to home on Doxycyline Rx for 5 days.   In the ED, she was found  to have pulmonary edema on chest X-Ray and was initially administered 40 mg of IV Lasix x 1 and referred for admission.   Consultants:  Cardiology  Procedures:  None  Antibiotics: Anti-infectives    Start     Dose/Rate Route Frequency Ordered Stop   03/10/15 1800  azithromycin (ZITHROMAX) 500 mg in  dextrose 5 % 250 mL IVPB     500 mg 250 mL/hr over 60 Minutes Intravenous Every 24 hours 03/10/15 1139     03/10/15 1200  cefTRIAXone (ROCEPHIN) 1 g in dextrose 5 % 50 mL IVPB     1 g 100 mL/hr over 30 Minutes Intravenous Every 24 hours 03/10/15 1139     03/09/15 1000  doxycycline (VIBRA-TABS) tablet 100 mg  Status:  Discontinued     100 mg Oral Every 12 hours 03/09/15 0500 03/10/15 1139        Objective: Filed Vitals:   03/11/15 0601 03/11/15 1943 03/11/15 2020 03/12/15 0519  BP: 115/81  102/46 108/55  Pulse: 81  71 72  Temp: 99 F (37.2 C)  98.7 F (37.1 C) 97.5 F (36.4 C)  TempSrc: Oral  Oral Oral  Resp: 20  19 17   Height:      Weight:  171.913 kg (379 lb)  170.054 kg (374 lb 14.4 oz)  SpO2: 100%  99% 97%    Intake/Output Summary (Last 24 hours) at 03/12/15 1218 Last data filed at 03/12/15 6644  Gross per 24 hour  Intake    300 ml  Output   3000 ml  Net  -2700 ml    Exam:  Head: Normocephalic, atraumatic. Neck: Supple without bruits, JVD not able to be assessed due to body habitus. Lungs: Resp regular and unlabored, Crackles present at bases bilaterally. Heart: Irregularly irregular, S1, S2, no S3, S4, or murmur; no rub. Abdomen: Soft, non-tender, non-distended with normoactive bowel sounds. No hepatomegaly. No rebound/guarding. No obvious abdominal masses. Extremities: No clubbing or cyanosis, trace edema. Distal pedal pulses are 2+ bilaterally. Neuro: Alert and oriented X 3. Moves all extremities spontaneously Data Review   Micro Results Recent Results (from the past 240 hour(s))  C difficile quick scan w PCR reflex     Status: Abnormal   Collection Time: 03/04/15 11:39 AM  Result Value Ref Range Status   C Diff antigen POSITIVE (A) NEGATIVE Final   C Diff toxin NEGATIVE NEGATIVE Final   C Diff interpretation   Final    C. difficile present, but toxin not detected. This indicates colonization. In most cases, this does not require treatment. If patient  has signs and symptoms consistent with colitis, consider treatment.    Radiology Reports Dg Chest 2 View  02/27/2015   CLINICAL DATA:  Persistent cough for several months  EXAM: CHEST - 2 VIEW  COMPARISON:  03/08/2013  FINDINGS: Cardiac shadow is within normal limits. Increased density is noted along the medial aspect of the right lung base. A portion of this is related to the patient's body habitus although an early infiltrate cannot be excluded. The lateral images are of little utility due to the patient's body habitus.  IMPRESSION: Changes suggestive of early infiltrate in the medial right lung base.   Electronically Signed   By: Inez Catalina M.D.   On: 02/27/2015 17:03   Ct Angio Chest Pe W/cm &/or Wo Cm  03/10/2015   CLINICAL DATA:  Progressive shortness of breath. Recent pneumonia. Distant history of breast carcinoma.  EXAM: CT ANGIOGRAPHY CHEST WITH CONTRAST  TECHNIQUE: Multidetector CT imaging of the chest was performed using the standard protocol during bolus administration of intravenous contrast. Multiplanar CT image reconstructions and MIPs were obtained to evaluate the vascular anatomy.  CONTRAST:  126mL OMNIPAQUE IOHEXOL 350 MG/ML SOLN  COMPARISON:  Chest radiograph, 03/09/2015  FINDINGS: Angiographic study: Exam somewhat limited by respiratory motion. Allowing for this, there is no evidence of a pulmonary embolus. Great vessels normal in caliber. No aortic dissection.  Thoracic inlet:  No mass or adenopathy.  Mediastinum and hila: Mild enlargement of the heart. Mild coronary artery calcifications. No mediastinal or hilar masses or pathologically enlarged lymph nodes.  Lungs and pleura: Moderate right and small left pleural effusions. Multiple areas of hazy ground-glass opacity with associated coarse reticular and linear opacity. Mild interstitial thickening seen more diffusely. No pneumothorax. No lung mass or suspicious nodule.  Limited upper abdomen:  No acute finding.  Musculoskeletal:  No  osteoblastic or osteolytic lesions.  Review of the MIP images confirms the above findings.  IMPRESSION: 1. No evidence of a pulmonary embolus. 2. Findings consistent with congestive heart failure with cardiomegaly, moderate right and small left pleural effusions interstitial thickening. There is also linear and reticular opacity areas of hazy airspace opacity which may be due to atelectasis and small airways disease. Infection is possible.   Electronically Signed   By: Lajean Manes M.D.   On: 03/10/2015 17:50   Dg Chest Port 1 View  03/09/2015   CLINICAL DATA:  Repeated syncopal episodes. History of morbid obesity, hypertension, RIGHT breast cancer, chronic bronchitis, shortness of breath, RIGHT leg DVT.  EXAM: PORTABLE CHEST - 1 VIEW  COMPARISON:  Chest radiograph February 27, 2015  FINDINGS: The cardiac silhouette is mildly enlarged, unchanged. Calcified aortic knob. Diffuse interstitial prominence, patchy mid and lower lung zone alveolar airspace opacities with air bronchograms. No pleural effusion. No pneumothorax. RIGHT chest wall surgical clips and coarse calcifications projecting RIGHT breast. Severe degenerative change of the shoulders, widened RIGHT AC joint suggests old separation.  IMPRESSION: Stable cardiomegaly. Interstitial prominence could represent pulmonary edema or atypical infection with increasing mid and lower lung zone confluent edema or pneumonia. Recommend after treatment to verify improvement.   Electronically Signed   By: Elon Alas M.D.   On: 03/09/2015 01:07     CBC  Recent Labs Lab 03/09/15 0010 03/10/15 0510 03/11/15 0511 03/12/15 0504  WBC 6.8 7.0 9.1 6.8  HGB 12.0 10.4* 13.0 10.5*  HCT 38.4 34.9* 43.0 34.0*  PLT 245 193 189 160  MCV 101.3* 103.9* 106.7* 103.3*  MCH 31.7 31.0 32.3 31.9  MCHC 31.3 29.8* 30.2 30.9  RDW 16.0* 16.0* 16.3* 15.8*  LYMPHSABS 1.0  --   --   --   MONOABS 0.3  --   --   --   EOSABS 0.1  --   --   --   BASOSABS 0.0  --   --   --      Chemistries   Recent Labs Lab 03/06/15 0810 03/09/15 0010 03/10/15 0510 03/11/15 0511 03/12/15 0504  NA  --  139 143 138 138  K  --  3.9 3.5 4.2 2.9*  CL  --  106 106 98* 94*  CO2  --  27 33* 30 36*  GLUCOSE  --  109* 88 84 93  BUN  --  9 10 10 11   CREATININE 0.90 0.99 1.11* 1.22* 1.11*  CALCIUM  --  8.9 8.6* 8.8* 8.2*  AST  --  22 20  22 14*  ALT  --  10* 11* 14 10*  ALKPHOS  --  64 56 64 54  BILITOT  --  0.8 0.5 0.6 0.6   ------------------------------------------------------------------------------------------------------------------ estimated creatinine clearance is 79.4 mL/min (by C-G formula based on Cr of 1.11). ------------------------------------------------------------------------------------------------------------------ No results for input(s): HGBA1C in the last 72 hours. ------------------------------------------------------------------------------------------------------------------ No results for input(s): CHOL, HDL, LDLCALC, TRIG, CHOLHDL, LDLDIRECT in the last 72 hours. ------------------------------------------------------------------------------------------------------------------ No results for input(s): TSH, T4TOTAL, T3FREE, THYROIDAB in the last 72 hours.  Invalid input(s): FREET3 ------------------------------------------------------------------------------------------------------------------ No results for input(s): VITAMINB12, FOLATE, FERRITIN, TIBC, IRON, RETICCTPCT in the last 72 hours.  Coagulation profile No results for input(s): INR, PROTIME in the last 168 hours.   Recent Labs  03/10/15 1459  DDIMER 2.30*    Cardiac Enzymes  Recent Labs Lab 03/09/15 1125 03/09/15 1700 03/09/15 2330  TROPONINI <0.03 <0.03 <0.03   ------------------------------------------------------------------------------------------------------------------ Invalid input(s): POCBNP   CBG: No results for input(s): GLUCAP in the last 168  hours.     Studies: Ct Angio Chest Pe W/cm &/or Wo Cm  03/10/2015   CLINICAL DATA:  Progressive shortness of breath. Recent pneumonia. Distant history of breast carcinoma.  EXAM: CT ANGIOGRAPHY CHEST WITH CONTRAST  TECHNIQUE: Multidetector CT imaging of the chest was performed using the standard protocol during bolus administration of intravenous contrast. Multiplanar CT image reconstructions and MIPs were obtained to evaluate the vascular anatomy.  CONTRAST:  125mL OMNIPAQUE IOHEXOL 350 MG/ML SOLN  COMPARISON:  Chest radiograph, 03/09/2015  FINDINGS: Angiographic study: Exam somewhat limited by respiratory motion. Allowing for this, there is no evidence of a pulmonary embolus. Great vessels normal in caliber. No aortic dissection.  Thoracic inlet:  No mass or adenopathy.  Mediastinum and hila: Mild enlargement of the heart. Mild coronary artery calcifications. No mediastinal or hilar masses or pathologically enlarged lymph nodes.  Lungs and pleura: Moderate right and small left pleural effusions. Multiple areas of hazy ground-glass opacity with associated coarse reticular and linear opacity. Mild interstitial thickening seen more diffusely. No pneumothorax. No lung mass or suspicious nodule.  Limited upper abdomen:  No acute finding.  Musculoskeletal:  No osteoblastic or osteolytic lesions.  Review of the MIP images confirms the above findings.  IMPRESSION: 1. No evidence of a pulmonary embolus. 2. Findings consistent with congestive heart failure with cardiomegaly, moderate right and small left pleural effusions interstitial thickening. There is also linear and reticular opacity areas of hazy airspace opacity which may be due to atelectasis and small airways disease. Infection is possible.   Electronically Signed   By: Lajean Manes M.D.   On: 03/10/2015 17:50      Lab Results  Component Value Date   HGBA1C 5.8* 06/04/2012   HGBA1C 6.2* 07/02/2011   Lab Results  Component Value Date   LDLCALC   09/09/2010    71        Total Cholesterol/HDL:CHD Risk Coronary Heart Disease Risk Table                     Men   Women  1/2 Average Risk   3.4   3.3  Average Risk       5.0   4.4  2 X Average Risk   9.6   7.1  3 X Average Risk  23.4   11.0        Use the calculated Patient Ratio above and the CHD Risk Table to determine the patient's CHD Risk.  ATP III CLASSIFICATION (LDL):  <100     mg/dL   Optimal  100-129  mg/dL   Near or Above                    Optimal  130-159  mg/dL   Borderline  160-189  mg/dL   High  >190     mg/dL   Very High   CREATININE 1.11* 03/12/2015       Scheduled Meds: . antiseptic oral rinse  7 mL Mouth Rinse BID  . azithromycin  500 mg Intravenous Q24H  . cefTRIAXone (ROCEPHIN)  IV  1 g Intravenous Q24H  . famotidine  20 mg Oral BID  . feeding supplement  1 Container Oral TID BM  . feeding supplement (PRO-STAT SUGAR FREE 64)  30 mL Oral BID  . furosemide  60 mg Intravenous 3 times per day  . gabapentin  300 mg Oral QHS  . nebivolol  2.5 mg Oral Daily  . rivaroxaban  20 mg Oral Q supper  . saccharomyces boulardii  250 mg Oral BID  . sertraline  50 mg Oral QHS  . sodium chloride  3 mL Intravenous Q12H   Continuous Infusions:   Principal Problem:   CHF exacerbation Active Problems:   BMI 70 and over, adult   Stasis dermatitis   Obstructive sleep apnea on CPAP   PAF (paroxysmal atrial fibrillation)   Cellulitis of left forearm   Lymphedema   Syncope   Essential hypertension   Acute diastolic CHF (congestive heart failure)   Faintness   Pressure ulcer    Time spent: 45 minutes   [facility] Pager [phone]. If 7PM-7AM, please contact night-coverage at [email or web address], password [facility] 03/12/2015, 12:18 PM  LOS: 3 days

## 2015-03-12 NOTE — Progress Notes (Signed)
This patient is receiving the antibiotic Azithromycin by the intravenous route. Based on criteria approved by the Pharmacy and Therapeutics Committee, and the Infectious Disease Division, the antibiotic(s) is / are being converted to equivalent oral dose form(s). These criteria include: . Patient being treated for a respiratory tract infection, urinary tract infection, cellulitis, or Clostridium Difficile Associated Diarrhea . The patient is not neutropenic and does not exhibit a GI malabsorption state . The patient is eating (either orally or per tube) and/or has been taking other orally administered medications for at least 24 hours. . The patient is improving clinically (physician assessment and a 24-hour Tmax of < 100.5 F).  If you have questions about this conversion, please contact the pharmacy department. Thank you.  Hershal Coria, PharmD, BCPS Pager: [phone] 03/12/2015 1:27 PM

## 2015-03-13 LAB — BASIC METABOLIC PANEL
Anion gap: 7 (ref 5–15)
BUN: 12 mg/dL (ref 6–20)
CALCIUM: 8.2 mg/dL — AB (ref 8.9–10.3)
CHLORIDE: 93 mmol/L — AB (ref 101–111)
CO2: 37 mmol/L — AB (ref 22–32)
CREATININE: 0.94 mg/dL (ref 0.44–1.00)
GFR calc Af Amer: >60 mL/min (ref >60)
GFR calc non Af Amer: >60 mL/min (ref >60)
Glucose, Bld: 92 mg/dL (ref 65–99)
Potassium: 3.3 mmol/L — ABNORMAL LOW (ref 3.5–5.1)
SODIUM: 137 mmol/L (ref 135–145)

## 2015-03-13 LAB — MAGNESIUM: Magnesium: 1.4 mg/dL — ABNORMAL LOW (ref 1.7–2.4)

## 2015-03-13 MED ORDER — POTASSIUM CHLORIDE CRYS ER 20 MEQ PO TBCR
40.0000 meq | EXTENDED_RELEASE_TABLET | Freq: Three times a day (TID) | ORAL | Status: AC
  Administered 2015-03-13 – 2015-03-15 (×6): 40 meq via ORAL
  Filled 2015-03-13 (×6): qty 2

## 2015-03-13 NOTE — Progress Notes (Addendum)
[facility] PROGRESS NOTE  Neeka Urista BDZ:[id number] DOB: Apr 29, 1951 DOA: 03/08/2015 PCP: Alvester Chou, NP  Assessment/Plan: Principal Problem:   CHF exacerbation Active Problems:   BMI 70 and over, adult   Stasis dermatitis   Obstructive sleep apnea on CPAP   PAF (paroxysmal atrial fibrillation)   Cellulitis of left forearm   Lymphedema   Syncope   Essential hypertension   Acute diastolic CHF (congestive heart failure)   Faintness   Pressure ulcer   Subjective-brisk diuresis and 24 hours of 4700 , orthopnea improving   Assessment and plan   community-acquired pneumonia Discharged on oral doxycycline on 9/15 Readmitted for worsening shortness of breath, syncope Repeat chest x-ray on 9/17 shows atypical infection with increasing mid and lower lung zone edema/pneumonia. Restarted back on Rocephin/azithromycin for community-acquired pneumonia which she has completed today [total of 5 days since admission]. No further antibiotics indicated at this time. Speech therapy recommends regular diet with thin liquids  Syncope EKG is nonischemic and trop is normal x 3. Qtc was prolonged on EKGs done initially but is normal now.LE Dopplers negative for DVT. CTA on 03/10/2015 negative for PE.Marland Kitchen Discontinued Zoloft and Trazadone due to QTC prolongation. Repeat echo with contrast shows EF of 55-60%, LVH, CT scan negative for PE  Acute on chronic diastolic heart failure-continue Lasix 60 mg IV every 8, follow renal function, creatinine stable and patient fortunately tolerating diuresis. Net output of -15.5 liters since admission. Switch to by mouth Lasix and 48 hours   Moderate pulmonary hypertension/obesity hypoventilation syndrome PA peak pressure 60 mmHg, cardiology consulted  Morbid  obesity BMI 70 and over,   Hypokalemia-replete   HTN (hypertension) Continue current antihypertensive therapy and monitor blood pressure.     PAF (paroxysmal atrial  fibrillation)-reduced  BYSTOLIC to 2.5 mg given pauses on telemetry. Cardiology consulted for further recommendations about anticoagulation  CHADS2VASC score of 3. Cardiology recommends xarelto which was initiated on 9/19   [facility] care consultation ordered  Obstructive sleep apnea on CPAP. CPAP setting is auto 4-16 cmH20  DVT prophylaxsis Lovenox  Code Status:  Patient confirmed that she is a DO NOT RESUSCITATE DO NOT INTUBATE    Code Status Orders        Start     Ordered   03/10/15 1012  Do not attempt resuscitation (DNR)   Continuous    Question Answer Comment  In the event of cardiac or respiratory ARREST Do not call a "code blue"   In the event of cardiac or respiratory ARREST Do not perform Intubation, CPR, defibrillation or ACLS   In the event of cardiac or respiratory ARREST Use medication by any route, position, wound care, and other measures to relive pain and suffering. May use oxygen, suction and manual treatment of airway obstruction as needed for comfort.      03/10/15 1012     Family Communication: No family by the bedside Disposition Plan: Anticipated discharge 9/23 with home health  Brief narrative: Marisha Renier is a 64 y.o. female with a history of PAF, HTN, OSA, Morbid Obesity, Lymphedema, [place] a remote history of Breast Cancer S/P Lumpectomy who presents to the ED with complaints of worsening SOB over the past 24 hours and syncope x 2 which occurred after she was repositioned in bed. She reports having a headache and feeling Dizzy before passing out. She was found by EMSs to have O2 sats of 85% and was placed on 4 liters of NCO2. She was discharged  from the hospital on 09/15 for Cellulitis of the Left Forearm, and CAP Pneumonia and was discharged to home on Doxycyline Rx for 5 days.   In the ED, she was found to have pulmonary edema on chest X-Ray and was initially administered 40 mg of IV Lasix x 1 and referred for admission.  During this  admission the patient has been diuresed for congestive heart failure with Lasix 60 mg IV every 8 hours. Fortunately the patient's renal function has been stable. Cardiology was consulted for atrial fibrillation and anticoagulation recommendations. Patient was initiated on Xarelto on 9/19. Continue diuresis for another 48-72 hours. Cardiology recommends switching to by mouth Lasix in 2 days. Anticipate that the patient would need discharge to home with home health/ hoyer lift  Consultants:  Cardiology  Procedures:  None  Antibiotics: Anti-infectives    Start     Dose/Rate Route Frequency Ordered Stop   03/12/15 1800  azithromycin (ZITHROMAX) tablet 500 mg     500 mg Oral Every 24 hours 03/12/15 1329     03/10/15 1800  azithromycin (ZITHROMAX) 500 mg in dextrose 5 % 250 mL IVPB  Status:  Discontinued     500 mg 250 mL/hr over 60 Minutes Intravenous Every 24 hours 03/10/15 1139 03/12/15 1329   03/10/15 1200  cefTRIAXone (ROCEPHIN) 1 g in dextrose 5 % 50 mL IVPB     1 g 100 mL/hr over 30 Minutes Intravenous Every 24 hours 03/10/15 1139     03/09/15 1000  doxycycline (VIBRA-TABS) tablet 100 mg  Status:  Discontinued     100 mg Oral Every 12 hours 03/09/15 0500 03/10/15 1139        Objective: Filed Vitals:   03/12/15 0519 03/12/15 1500 03/12/15 2158 03/13/15 0559  BP: 108/55 97/48 98/47  134/70  Pulse: 72 78 64 63  Temp: 97.5 F (36.4 C) 98.1 F (36.7 C) 98.2 F (36.8 C) 98.2 F (36.8 C)  TempSrc: Oral Oral Oral Axillary  Resp: 17 18 16 20   Height:      Weight: 170.054 kg (374 lb 14.4 oz)   160.573 kg (354 lb)  SpO2: 97% 99% 95% 98%    Intake/Output Summary (Last 24 hours) at 03/13/15 1256 Last data filed at 03/13/15 0916  Gross per 24 hour  Intake    480 ml  Output   4550 ml  Net  -4070 ml    Exam:  Head: Normocephalic, atraumatic. Neck: Supple without bruits, JVD not able to be assessed due to body habitus. Lungs: Resp regular and unlabored, Crackles present at  bases bilaterally. Heart: Irregularly irregular, S1, S2, no S3, S4, or murmur; no rub. Abdomen: Soft, non-tender, non-distended with normoactive bowel sounds. No hepatomegaly. No rebound/guarding. No obvious abdominal masses. Extremities: No clubbing or cyanosis, trace edema. Distal pedal pulses are 2+ bilaterally. Neuro: Alert and oriented X 3. Moves all extremities spontaneously Data Review   Micro Results Recent Results (from the past 240 hour(s))  C difficile quick scan w PCR reflex     Status: Abnormal   Collection Time: 03/04/15 11:39 AM  Result Value Ref Range Status   C Diff antigen POSITIVE (A) NEGATIVE Final   C Diff toxin NEGATIVE NEGATIVE Final   C Diff interpretation   Final    C. difficile present, but toxin not detected. This indicates colonization. In most cases, this does not require treatment. If patient has signs and symptoms consistent with colitis, consider treatment.    Radiology Reports Dg Chest 2 View  02/27/2015   CLINICAL DATA:  Persistent cough for several months  EXAM: CHEST - 2 VIEW  COMPARISON:  03/08/2013  FINDINGS: Cardiac shadow is within normal limits. Increased density is noted along the medial aspect of the right lung base. A portion of this is related to the patient's body habitus although an early infiltrate cannot be excluded. The lateral images are of little utility due to the patient's body habitus.  IMPRESSION: Changes suggestive of early infiltrate in the medial right lung base.   Electronically Signed   By: Inez Catalina M.D.   On: 02/27/2015 17:03   Ct Angio Chest Pe W/cm &/or Wo Cm  03/10/2015   CLINICAL DATA:  Progressive shortness of breath. Recent pneumonia. Distant history of breast carcinoma.  EXAM: CT ANGIOGRAPHY CHEST WITH CONTRAST  TECHNIQUE: Multidetector CT imaging of the chest was performed using the standard protocol during bolus administration of intravenous contrast. Multiplanar CT image reconstructions and MIPs were obtained to  evaluate the vascular anatomy.  CONTRAST:  140mL OMNIPAQUE IOHEXOL 350 MG/ML SOLN  COMPARISON:  Chest radiograph, 03/09/2015  FINDINGS: Angiographic study: Exam somewhat limited by respiratory motion. Allowing for this, there is no evidence of a pulmonary embolus. Great vessels normal in caliber. No aortic dissection.  Thoracic inlet:  No mass or adenopathy.  Mediastinum and hila: Mild enlargement of the heart. Mild coronary artery calcifications. No mediastinal or hilar masses or pathologically enlarged lymph nodes.  Lungs and pleura: Moderate right and small left pleural effusions. Multiple areas of hazy ground-glass opacity with associated coarse reticular and linear opacity. Mild interstitial thickening seen more diffusely. No pneumothorax. No lung mass or suspicious nodule.  Limited upper abdomen:  No acute finding.  Musculoskeletal:  No osteoblastic or osteolytic lesions.  Review of the MIP images confirms the above findings.  IMPRESSION: 1. No evidence of a pulmonary embolus. 2. Findings consistent with congestive heart failure with cardiomegaly, moderate right and small left pleural effusions interstitial thickening. There is also linear and reticular opacity areas of hazy airspace opacity which may be due to atelectasis and small airways disease. Infection is possible.   Electronically Signed   By: Lajean Manes M.D.   On: 03/10/2015 17:50   Dg Chest Port 1 View  03/09/2015   CLINICAL DATA:  Repeated syncopal episodes. History of morbid obesity, hypertension, RIGHT breast cancer, chronic bronchitis, shortness of breath, RIGHT leg DVT.  EXAM: PORTABLE CHEST - 1 VIEW  COMPARISON:  Chest radiograph February 27, 2015  FINDINGS: The cardiac silhouette is mildly enlarged, unchanged. Calcified aortic knob. Diffuse interstitial prominence, patchy mid and lower lung zone alveolar airspace opacities with air bronchograms. No pleural effusion. No pneumothorax. RIGHT chest wall surgical clips and coarse  calcifications projecting RIGHT breast. Severe degenerative change of the shoulders, widened RIGHT AC joint suggests old separation.  IMPRESSION: Stable cardiomegaly. Interstitial prominence could represent pulmonary edema or atypical infection with increasing mid and lower lung zone confluent edema or pneumonia. Recommend after treatment to verify improvement.   Electronically Signed   By: Elon Alas M.D.   On: 03/09/2015 01:07     CBC  Recent Labs Lab 03/09/15 0010 03/10/15 0510 03/11/15 0511 03/12/15 0504  WBC 6.8 7.0 9.1 6.8  HGB 12.0 10.4* 13.0 10.5*  HCT 38.4 34.9* 43.0 34.0*  PLT 245 193 189 160  MCV 101.3* 103.9* 106.7* 103.3*  MCH 31.7 31.0 32.3 31.9  MCHC 31.3 29.8* 30.2 30.9  RDW 16.0* 16.0* 16.3* 15.8*  LYMPHSABS 1.0  --   --   --  MONOABS 0.3  --   --   --   EOSABS 0.1  --   --   --   BASOSABS 0.0  --   --   --     Chemistries   Recent Labs Lab 03/09/15 0010 03/10/15 0510 03/11/15 0511 03/12/15 0504 03/13/15 0431  NA 139 143 138 138 137  K 3.9 3.5 4.2 2.9* 3.3*  CL 106 106 98* 94* 93*  CO2 27 33* 30 36* 37*  GLUCOSE 109* 88 84 93 92  BUN 9 10 10 11 12   CREATININE 0.99 1.11* 1.22* 1.11* 0.94  CALCIUM 8.9 8.6* 8.8* 8.2* 8.2*  AST 22 20 22  14*  --   ALT 10* 11* 14 10*  --   ALKPHOS 64 56 64 54  --   BILITOT 0.8 0.5 0.6 0.6  --    ------------------------------------------------------------------------------------------------------------------ estimated creatinine clearance is 90.2 mL/min (by C-G formula based on Cr of 0.94). ------------------------------------------------------------------------------------------------------------------ No results for input(s): HGBA1C in the last 72 hours. ------------------------------------------------------------------------------------------------------------------ No results for input(s): CHOL, HDL, LDLCALC, TRIG, CHOLHDL, LDLDIRECT in the last 72  hours. ------------------------------------------------------------------------------------------------------------------ No results for input(s): TSH, T4TOTAL, T3FREE, THYROIDAB in the last 72 hours.  Invalid input(s): FREET3 ------------------------------------------------------------------------------------------------------------------ No results for input(s): VITAMINB12, FOLATE, FERRITIN, TIBC, IRON, RETICCTPCT in the last 72 hours.  Coagulation profile No results for input(s): INR, PROTIME in the last 168 hours.   Recent Labs  03/10/15 1459  DDIMER 2.30*    Cardiac Enzymes  Recent Labs Lab 03/09/15 1125 03/09/15 1700 03/09/15 2330  TROPONINI <0.03 <0.03 <0.03   ------------------------------------------------------------------------------------------------------------------ Invalid input(s): POCBNP   CBG: No results for input(s): GLUCAP in the last 168 hours.     Studies: No results found.    Lab Results  Component Value Date   HGBA1C 5.8* 06/04/2012   HGBA1C 6.2* 07/02/2011   Lab Results  Component Value Date   LDLCALC  09/09/2010    71        Total Cholesterol/HDL:CHD Risk Coronary Heart Disease Risk Table                     Men   Women  1/2 Average Risk   3.4   3.3  Average Risk       5.0   4.4  2 X Average Risk   9.6   7.1  3 X Average Risk  23.4   11.0        Use the calculated Patient Ratio above and the CHD Risk Table to determine the patient's CHD Risk.        ATP III CLASSIFICATION (LDL):  <100     mg/dL   Optimal  100-129  mg/dL   Near or Above                    Optimal  130-159  mg/dL   Borderline  160-189  mg/dL   High  >190     mg/dL   Very High   CREATININE 0.94 03/13/2015       Scheduled Meds: . antiseptic oral rinse  7 mL Mouth Rinse BID  . azithromycin  500 mg Oral Q24H  . cefTRIAXone (ROCEPHIN)  IV  1 g Intravenous Q24H  . famotidine  20 mg Oral BID  . feeding supplement  1 Container Oral TID BM  . feeding  supplement (PRO-STAT SUGAR FREE 64)  30 mL Oral BID  . furosemide  60 mg Intravenous 3 times per day  .  gabapentin  300 mg Oral QHS  . nebivolol  2.5 mg Oral Daily  . potassium chloride  40 mEq Oral TID  . rivaroxaban  20 mg Oral Q supper  . saccharomyces boulardii  250 mg Oral BID  . sertraline  50 mg Oral QHS  . sodium chloride  3 mL Intravenous Q12H   Continuous Infusions:   Principal Problem:   CHF exacerbation Active Problems:   BMI 70 and over, adult   Stasis dermatitis   Obstructive sleep apnea on CPAP   PAF (paroxysmal atrial fibrillation)   Cellulitis of left forearm   Lymphedema   Syncope   Essential hypertension   Acute diastolic CHF (congestive heart failure)   Faintness   Pressure ulcer    Time spent: 45 minutes   [facility] Pager [phone]. If 7PM-7AM, please contact night-coverage at [email or web address], password [facility] 03/13/2015, 12:56 PM  LOS: 4 days

## 2015-03-13 NOTE — Progress Notes (Signed)
Patient Name: [name] Date of Encounter: 03/13/2015  Principal Problem:   CHF exacerbation Active Problems:   BMI 70 and over, adult   Stasis dermatitis   Obstructive sleep apnea on CPAP   PAF (paroxysmal atrial fibrillation)   Cellulitis of left forearm   Lymphedema   Syncope   Essential hypertension   Acute diastolic CHF (congestive heart failure)   Faintness   Pressure ulcer  Primary Cardiologist: New - Dr. Radford Pax Patient Profile: 64 y.o. female w/ PMH of PAF, HTN, OSA, Morbid Obesity, Lymphedema, and a remote history of Breast Cancer (S/P Lumpectomy) admitted on 03/09/2015 for worsening shortness of breath and syncope.  SUBJECTIVE: Reports her breathing continues to improve and along with her orthopnea. Denies any chest pain or palpitations.  OBJECTIVE Filed Vitals:   03/12/15 0519 03/12/15 1500 03/12/15 2158 03/13/15 0559  BP: 108/55 97/48 98/47  134/70  Pulse: 72 78 64 63  Temp: 97.5 F (36.4 C) 98.1 F (36.7 C) 98.2 F (36.8 C) 98.2 F (36.8 C)  TempSrc: Oral Oral Oral Axillary  Resp: 17 18 16 20   Height:      Weight: 374 lb 14.4 oz (170.054 kg)   354 lb (160.573 kg)  SpO2: 97% 99% 95% 98%    Intake/Output Summary (Last 24 hours) at 03/13/15 0831 Last data filed at 03/13/15 0606  Gross per 24 hour  Intake    530 ml  Output   4700 ml  Net  -4170 ml   Filed Weights   03/11/15 1943 03/12/15 0519 03/13/15 0559  Weight: 379 lb (171.913 kg) 374 lb 14.4 oz (170.054 kg) 354 lb (160.573 kg)    PHYSICAL EXAM General: Morbidly obese female in no acute distress. Head: Normocephalic, atraumatic.  Neck: Supple without bruits, JVD unable to be assessed due to body habitus. Lungs:  Resp regular and unlabored, crackles present at bases. Heart: Irregularly irregular, S1, S2, no S3, S4, or murmur; no rub. Abdomen: Soft, non-tender, non-distended with normoactive bowel sounds. No hepatomegaly. No rebound/guarding. No obvious abdominal masses. Extremities: No  clubbing, trace edema. Distal pedal pulses are 2+ bilaterally. Neuro: Alert and oriented X 3. Moves all extremities spontaneously. Psych: Normal affect.  LABS: CBC: Recent Labs  03/11/15 0511 03/12/15 0504  WBC 9.1 6.8  HGB 13.0 10.5*  HCT 43.0 34.0*  MCV 106.7* 103.3*  PLT 189 657   Basic Metabolic Panel: Recent Labs  03/12/15 0504 03/13/15 0431  NA 138 137  K 2.9* 3.3*  CL 94* 93*  CO2 36* 37*  GLUCOSE 93 92  BUN 11 12  CREATININE 1.11* 0.94  CALCIUM 8.2* 8.2*   Liver Function Tests: Recent Labs  03/11/15 0511 03/12/15 0504  AST 22 14*  ALT 14 10*  ALKPHOS 64 54  BILITOT 0.6 0.6  PROT 8.2* 6.7  ALBUMIN 3.0* 2.5*   BNP:  B NATRIURETIC PEPTIDE  Date/Time Value Ref Range Status  03/09/2015 12:27 AM 406.0* 0.0 - 100.0 pg/mL Final  D-dimer: Recent Labs  03/10/15 1459  DDIMER 2.30*    TELE:  Atrial fibrillation with rate in 60's - 70's.       Current Medications:  . antiseptic oral rinse  7 mL Mouth Rinse BID  . azithromycin  500 mg Oral Q24H  . cefTRIAXone (ROCEPHIN)  IV  1 g Intravenous Q24H  . famotidine  20 mg Oral BID  . feeding supplement  1 Container Oral TID BM  . feeding supplement (PRO-STAT SUGAR FREE 64)  30 mL  Oral BID  . furosemide  60 mg Intravenous 3 times per day  . gabapentin  300 mg Oral QHS  . nebivolol  2.5 mg Oral Daily  . potassium chloride  40 mEq Oral TID  . rivaroxaban  20 mg Oral Q supper  . saccharomyces boulardii  250 mg Oral BID  . sertraline  50 mg Oral QHS  . sodium chloride  3 mL Intravenous Q12H      ASSESSMENT AND PLAN:  1. Syncope - EKG is nonischemic and trop is normal x 3. - LE Dopplers negative for DVT. CTA on 03/10/2015 negative for PE.  2. Moderate pulmonary HTN - most likely secondary to morbid obesity with obesity hypoventilation syndrome and OSA.   3. HTN - BP has been 97/47  - 134/70 in the past 24 hours - continue BB  4. H/O PAF  - She has a CHADS2VASC score of 3 so recommendations  are for long term anticoagulation for stroke prevention.Anticoagulation had been recommended several years ago but she declined due to history of rectal bleeding.  - Xarelto started on 03/11/2015 and appears to be tolerating the medication well. - rate controlled with BB.  5. Acute on chronic diastolic CHF  - BNP mildly elevated to 406 on 0917/2016. - Reports her lowest weight at home has been in the 340's in the past year. Weight has been variable since admitted but was likely 395lbs at admission and was 374lbs yesterday. Today's weight reported as 354lbs but unsure of accuracy. - Has been receiving Lasix 60mg  IV TID. Net output of -15.5L (-4.1L on 03/12/2015). Continue diuresis.   6. Atypical CP  - cyclic troponin values were negative - LV Function normal on echo, although technically difficult study due to body habitus  7. Community Acquired Pneumonia - On Azithromycin and Rocephin - per IM  8. Hypokalemia - Potassium 3.3 on 03/13/2015. - being replaced this AM.  Signed, Erma Heritage , PA-C 8:31 AM 03/13/2015 Pager: [phone]  Would switch to oral diuretics next 48 hrs.  Not much else to add.  Cardiology will sign off She should be ready for d/c in next 2-3 days.  Jenkins Rouge

## 2015-03-13 NOTE — Progress Notes (Signed)
Placed patient on Cpap Auto titrate with 4 lpm bled in O2.  Pt tolerating well no issues to report will continue to monitor.

## 2015-03-13 NOTE — Progress Notes (Signed)
Pt placed on Auto CPAP 4-16 CMH20 via FFM with 4 LPM O2 bleed in.  Pt tolerating well at this time, RT to monitor and assess as needed.

## 2015-03-13 NOTE — Progress Notes (Signed)
Physical Therapy Treatment Patient Details Name: [name] MRN: [id number] DOB: 1951-02-17 Today's Date: 03/13/2015    History of Present Illness 64 y.o. female with a history of PAF, HTN, OSA, Morbid Obesity, Lymphedema, Breast Cancer S/P Lumpectomy, DVT after syncope episode at home and admitted for CHF exacerbation.     PT Comments    Pt performed UE exercises using yellow theraband and LE exercises in supine.  Pt presents with decreased R shoulder strength and ROM which are chronic per pt.  Follow Up Recommendations  Home health PT     Equipment Recommendations  Hospital bed;Other (comment) (hoyer lift)    Recommendations for Other Services       Precautions / Restrictions Precautions Precaution Comments: nonambulatory    Mobility  Bed Mobility Overal bed mobility: Needs Assistance;+2 for physical assistance Bed Mobility: Rolling Rolling: Mod assist;+2 for physical assistance         General bed mobility comments: NT assisted with removing bedpan and pericare while PT assisted with rolling to pt's left side  Transfers                    Ambulation/Gait                 Stairs            Wheelchair Mobility    Modified Rankin (Stroke Patients Only)       Balance                                    Cognition Arousal/Alertness: Awake/alert Behavior During Therapy: WFL for tasks assessed/performed Overall Cognitive Status: Within Functional Limits for tasks assessed                      Exercises Total Joint Exercises Ankle Circles/Pumps: AROM;Limitations;Both;10 reps Ankle Circles/Pumps Limitations: limited DF, rests in increased PF Heel Slides: AROM;Both;5 reps;Limitations Heel Slides Limitations: unable to achieve even 90* knee flexion, pt reports RA in knees however stated exercise felt good Hip ABduction/ADduction: AROM;Both;5 reps Straight Leg Raises: Both;5 reps;AROM General Exercises - Upper  Extremity Shoulder Flexion: AROM;Theraband;5 reps;Both Theraband Level (Shoulder Flexion): Level 1 (Yellow) Shoulder Horizontal ABduction: AROM;Both;5 reps;Theraband Theraband Level (Shoulder Horizontal Abduction): Level 1 (Yellow) Shoulder Horizontal ADduction: AROM;Both;5 reps;Theraband Theraband Level (Shoulder Horizontal Adduction): Level 1 (Yellow) Elbow Flexion: AROM;Both;5 reps;Theraband Theraband Level (Elbow Flexion): Level 1 (Yellow) Other Exercises Other Exercises: hold 15 sec abominals and LEs bicycling in supine x3 Other Exercises: obliques with reaching UE across body to tap PT hand supine x10 (reaching with R UE more limited then L UE)    General Comments        Pertinent Vitals/Pain Pain Assessment: No/denies pain    Home Living                      Prior Function            PT Goals (current goals can now be found in the care plan section) Progress towards PT goals: Progressing toward goals    Frequency  Min 2X/week    PT Plan Current plan remains appropriate    Co-evaluation             End of Session   Activity Tolerance: Patient tolerated treatment well Patient left: with call bell/phone within reach;in bed     Time: 1417-1435 PT Time Calculation (min) (ACUTE  ONLY): 18 min  Charges:  $Therapeutic Exercise: 8-22 mins                    G Codes:      LEMYRE,KATHrine E 2015-03-26, 2:56 PM Carmelia Bake, PT, DPT 2015/03/26 Pager: [phone]

## 2015-03-14 DIAGNOSIS — J189 Pneumonia, unspecified organism: Principal | ICD-10-CM

## 2015-03-14 DIAGNOSIS — G4733 Obstructive sleep apnea (adult) (pediatric): Secondary | ICD-10-CM

## 2015-03-14 DIAGNOSIS — I890 Lymphedema, not elsewhere classified: Secondary | ICD-10-CM

## 2015-03-14 LAB — BASIC METABOLIC PANEL
Anion gap: 5 (ref 5–15)
BUN: 12 mg/dL (ref 6–20)
CALCIUM: 8.4 mg/dL — AB (ref 8.9–10.3)
CO2: 41 mmol/L — AB (ref 22–32)
CREATININE: 1.03 mg/dL — AB (ref 0.44–1.00)
Chloride: 93 mmol/L — ABNORMAL LOW (ref 101–111)
GFR calc Af Amer: >60 mL/min (ref >60)
GFR, EST NON AFRICAN AMERICAN: 55 mL/min — AB (ref >60)
GLUCOSE: 95 mg/dL (ref 65–99)
Potassium: 3.9 mmol/L (ref 3.5–5.1)
Sodium: 139 mmol/L (ref 135–145)

## 2015-03-14 LAB — CBC
HCT: 36.8 % (ref 36.0–46.0)
Hemoglobin: 11 g/dL — ABNORMAL LOW (ref 12.0–15.0)
MCH: 30.9 pg (ref 26.0–34.0)
MCHC: 29.9 g/dL — AB (ref 30.0–36.0)
MCV: 103.4 fL — ABNORMAL HIGH (ref 78.0–100.0)
Platelets: 162 10*3/uL (ref 150–400)
RBC: 3.56 MIL/uL — ABNORMAL LOW (ref 3.87–5.11)
RDW: 15.7 % — AB (ref 11.5–15.5)
WBC: 6.6 10*3/uL (ref 4.0–10.5)

## 2015-03-14 LAB — MAGNESIUM: MAGNESIUM: 1.4 mg/dL — AB (ref 1.7–2.4)

## 2015-03-14 MED ORDER — FUROSEMIDE 40 MG PO TABS
80.0000 mg | ORAL_TABLET | Freq: Two times a day (BID) | ORAL | Status: DC
  Administered 2015-03-15 – 2015-03-16 (×3): 80 mg via ORAL
  Filled 2015-03-14 (×3): qty 2

## 2015-03-14 MED ORDER — SERTRALINE HCL 50 MG PO TABS
50.0000 mg | ORAL_TABLET | Freq: Every day | ORAL | Status: DC
  Administered 2015-03-14 – 2015-03-15 (×2): 50 mg via ORAL
  Filled 2015-03-14 (×2): qty 1

## 2015-03-14 MED ORDER — TRAZODONE HCL 50 MG PO TABS
100.0000 mg | ORAL_TABLET | Freq: Every evening | ORAL | Status: DC | PRN
  Administered 2015-03-14 – 2015-03-15 (×2): 100 mg via ORAL
  Filled 2015-03-14 (×2): qty 2

## 2015-03-14 MED ORDER — MAGNESIUM SULFATE 4 GM/100ML IV SOLN
4.0000 g | Freq: Once | INTRAVENOUS | Status: AC
  Administered 2015-03-14: 4 g via INTRAVENOUS
  Filled 2015-03-14: qty 100

## 2015-03-14 NOTE — Progress Notes (Signed)
Spoke with pt concerning HH. She selected [facility]. Referral given to [facility] with [facility].

## 2015-03-14 NOTE — Care Management Important Message (Signed)
Important Message  Patient Details  Name: [name] MRN: [id number] Date of Birth: November 23, 1950   Medicare Important Message Given:  [facility] notification given    Camillo Flaming 03/14/2015, 10:59 AMImportant Message  Patient Details  Name: [name] MRN: [id number] Date of Birth: September 01, 1950   Medicare Important Message Given:  Yes-second notification given    Camillo Flaming 03/14/2015, 10:58 AM

## 2015-03-14 NOTE — Progress Notes (Signed)
ANTICOAGULATION CONSULT NOTE - Initial Consult  Pharmacy Consult for Rivaroxaban Indication: atrial fibrillation  Allergies  Allergen Reactions  . Effexor [Venlafaxine Hydrochloride] Anxiety    Made her want to kill herself    Patient Measurements: Height: 5\' 4"  (162.6 cm) Weight: (!) 361 lb (163.749 kg) IBW/kg (Calculated) : 54.7   Vital Signs: Temp: 98 F (36.7 C) (09/22 0532) Temp Source: Oral (09/22 0532) BP: 124/55 mmHg (09/22 0532) Pulse Rate: 70 (09/22 0532)  Labs:  Recent Labs  03/12/15 0504 03/13/15 0431 03/14/15 0511  HGB 10.5*  --  11.0*  HCT 34.0*  --  36.8  PLT 160  --  162  CREATININE 1.11* 0.94 1.03*    Estimated Creatinine Clearance: 83.4 mL/min (by C-G formula based on Cr of 1.03).   Medical History: Past Medical History  Diagnosis Date  . Obesities, morbid   . Hypertension   . Cellulitis     right breast, right arm, ble  . Depression   . Anxiety   . 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 Breast cancer 1988  . Dysrhythmia   . PAF (paroxysmal atrial fibrillation) 06/04/2012  . Pneumonia     "several times; last time was in the 1990's" (04/07/2013)  . Chronic bronchitis     "maybe 4 years in a row; last time was ~1990" (04/07/2013)  . Obstructive sleep apnea on CPAP 06/04/2012  . Exertional shortness of breath   . Arthritis     "knees; fingers/hands" (04/07/2013)  . Stasis dermatitis   . Right leg DVT 04/06/2013    "behind my knee" (04/07/2013)     Assessment: Patient is a 64 y/o female with history of paroxysmal A Fib and DVT, currently admitted for shortness of breath and syncope.  Patient not on chronic anticoagulation due to fall risk.  Pharmacy consulted to start Xarelto for stroke prevention in AFib.  CHADS2VASc score of 4 indicates need for anticoagulation (CHF, HTN, Age 19, Female).  Cardiology recommends starting Xarelto,  which patient has used in the past for DVT treatment.  Ultrasound of lower extremities and CT Chest are negative for VTE on this admission.  Patient is morbidly obese with Wt of 182.8 kg and BMI > 70.  CrCl = 154, calculated by Cockroft-Gault using total body weight (as done in Xarelto trials).  No dose adjustment recommended due to weight.    Please note, there is limited data of use of DOACs in extreme obesity.  The International Society on Thrombosis and Haemostasis published 2016 guidelines suggest (Level of Evidence: Expert Opinion) against the use of Rivaroxaban (and other DOACs) in patients with Wt > 120kg or BMI >40 due to lack of clinical data.    Goal of Therapy:  Monitor platelets by anticoagulation protocol: Yes Reduce risk of stroke in patient with atrial fibrillation  Plan:  --Continue Rivaroxaban 20 mg PO once daily with evening meal --Provided Rivaroxaban education on 9/19 --Monitor CBC, SCr and signs of bleeding    Viann Fish 03/14/2015,8:08 AM

## 2015-03-14 NOTE — Progress Notes (Addendum)
[facility] PROGRESS NOTE  [name] ONG:[id number] DOB: March 12, 1951 DOA: 03/08/2015 PCP: Alvester Chou, NP  Assessment/Plan: #1 community acquired pneumonia Patient recently treated with doxycycline for community-acquired pneumonia. Patient was readmitted with worsening shortness of breath and the syncopal episode. Chest x-ray done on 03/09/2015 was concerning for typical infection plus or minus edema. Patient was placed back on Rocephin and azithromycin for community-acquired pneumonia and completed a course of antibiotic therapy. Clinical improvement. Patient has been assessed by speech therapy and recommended regular diet. Follow.  #2 syncope Unknown etiology. EKG done on admission did not have any ischemic changes. Cardiac enzymes were cycled were negative 3. Patient did not some QTC prolongation initially which had since resolved. Lower extremity Dopplers were negative for DVT. CT angiographic of the chest done on 03/10/2015 was negative for PE. Patient's Trazodone and zoloft were to be discontinued secondary to QTC prolongation. EKG from 03/09/2015 with no QTC prolongation. Continue trazodone and Zoloft. Repeat EKG. 2-D echo done showed a normal EF of 55-60% with LVH. 2-D echo was also consistent with pulmonary hypertension. Patient with no further syncopal episodes. Follow.  #3 acute on chronic diastolic heart failure Questionable etiology. Cardiac enzymes were negative 3. 2-D echo with EF of 55-60%. Patient with clinical improvement. Patient is -19.615 L during this hospitalization and -2.9 L over the past 24 hours. Continue beta blocker. Patient on IV Lasix. Will transition to oral Lasix tomorrow per cardiology recommendations..  #4 moderate pulmonary hypertension/obesity hypoventilation syndrome Per 2-D echo. Patient with a PA peak pressure of 60 mmhg. Per cardiology.  #5 morbid obesity  #6 hypokalemia Secondary to diureses. Replete.  #7 hypertension Stable. Continue  current regimen of Lasix, bystolic.  #8 paroxysmal atrial fibrillation Chads2Vasc score 3. Continue bystolic for rate control. Doses of in decreased secondary to pauses noted on telemetry. Continue xarelto for anticoagulation started on 03/11/2015.  #9 lymphedema Will care consulted.  #10 obstructive sleep apnea C Pap daily at bedtime.  #11 prophylaxis Xarelto for DVT prophylaxis.   Code Status: DO NOT RESUSCITATE Family Communication: Updated patient. No family at bedside. Disposition Plan: Hopefully home in the next 1-2 days once on oral diuretics with clinical improvement.   Consultants:  Cardiology: Dr. Fransico Him 03/10/2015  Procedures:  2-D echo 03/11/2015, 03/09/2015  Lower extremity Dopplers 03/09/2015  Chest x-ray and 03/09/2015  CT angio chest 03/10/2015  Antibiotics: None  HPI/Subjective: Patient denies any shortness of breath. Patient denies any chest pain. Patient states she's feeling better than on admission.  Objective: Filed Vitals:   03/14/15 1409  BP: 110/57  Pulse: 78  Temp: 97.5 F (36.4 C)  Resp:     Intake/Output Summary (Last 24 hours) at 03/14/15 1721 Last data filed at 03/14/15 1100  Gross per 24 hour  Intake    360 ml  Output   3600 ml  Net  -3240 ml   Filed Weights   03/12/15 0519 03/13/15 0559 03/14/15 0532  Weight: 170.054 kg (374 lb 14.4 oz) 160.573 kg (354 lb) 163.749 kg (361 lb)    Exam:   General:  NAD  Cardiovascular: Irregularly irregular  Respiratory: CTAB anterior lung fields.  Abdomen: Obese, soft, nontender, nondistended, positive bowel sounds.  Musculoskeletal: No clubbing cyanosis or edema.  Data Reviewed: Basic Metabolic Panel:  Recent Labs Lab 03/10/15 0510 03/11/15 0511 03/12/15 0504 03/13/15 0431 03/14/15 0511  NA 143 138 138 137 139  K 3.5 4.2 2.9* 3.3* 3.9  CL 106 98* 94* 93* 93*  CO2 33* 30  36* 37* 41*  GLUCOSE 88 84 93 92 95  BUN 10 10 11 12 12   CREATININE 1.11* 1.22* 1.11*  0.94 1.03*  CALCIUM 8.6* 8.8* 8.2* 8.2* 8.4*  MG  --   --   --  1.4* 1.4*   Liver Function Tests:  Recent Labs Lab 03/09/15 0010 03/10/15 0510 03/11/15 0511 03/12/15 0504  AST 22 20 22  14*  ALT 10* 11* 14 10*  ALKPHOS 64 56 64 54  BILITOT 0.8 0.5 0.6 0.6  PROT 7.5 6.6 8.2* 6.7  ALBUMIN 2.8* 2.5* 3.0* 2.5*   No results for input(s): LIPASE, AMYLASE in the last 168 hours. No results for input(s): AMMONIA in the last 168 hours. CBC:  Recent Labs Lab 03/09/15 0010 03/10/15 0510 03/11/15 0511 03/12/15 0504 03/14/15 0511  WBC 6.8 7.0 9.1 6.8 6.6  NEUTROABS 5.4  --   --   --   --   HGB 12.0 10.4* 13.0 10.5* 11.0*  HCT 38.4 34.9* 43.0 34.0* 36.8  MCV 101.3* 103.9* 106.7* 103.3* 103.4*  PLT 245 193 189 160 162   Cardiac Enzymes:  Recent Labs Lab 03/09/15 1125 03/09/15 1700 03/09/15 2330  TROPONINI <0.03 <0.03 <0.03   BNP (last 3 results)  Recent Labs  03/09/15 0027  BNP 406.0*    ProBNP (last 3 results) No results for input(s): PROBNP in the last 8760 hours.  CBG: No results for input(s): GLUCAP in the last 168 hours.  No results found for this or any previous visit (from the past 240 hour(s)).   Studies: No results found.  Scheduled Meds: . antiseptic oral rinse  7 mL Mouth Rinse BID  . famotidine  20 mg Oral BID  . feeding supplement  1 Container Oral TID BM  . feeding supplement (PRO-STAT SUGAR FREE 64)  30 mL Oral BID  . furosemide  60 mg Intravenous 3 times per day  . gabapentin  300 mg Oral QHS  . nebivolol  2.5 mg Oral Daily  . potassium chloride  40 mEq Oral TID  . rivaroxaban  20 mg Oral Q supper  . saccharomyces boulardii  250 mg Oral BID  . sertraline  50 mg Oral QHS  . sodium chloride  3 mL Intravenous Q12H   Continuous Infusions:   Principal Problem:   CHF exacerbation Active Problems:   BMI 70 and over, adult   Stasis dermatitis   Obstructive sleep apnea on CPAP   PAF (paroxysmal atrial fibrillation)   Cellulitis of left  forearm   Lymphedema   Syncope   Essential hypertension   Acute diastolic CHF (congestive heart failure)   Faintness   Pressure ulcer    Time spent: 35 minutes    [facility] MD [facility] Pager [phone]. If 7PM-7AM, please contact night-coverage at [email or web address], password [facility] 03/14/2015, 5:21 PM  LOS: 5 days

## 2015-03-15 LAB — MAGNESIUM: MAGNESIUM: 2.3 mg/dL (ref 1.7–2.4)

## 2015-03-15 LAB — BASIC METABOLIC PANEL
ANION GAP: 9 (ref 5–15)
BUN: 14 mg/dL (ref 6–20)
CALCIUM: 8.8 mg/dL — AB (ref 8.9–10.3)
CO2: 37 mmol/L — ABNORMAL HIGH (ref 22–32)
CREATININE: 0.98 mg/dL (ref 0.44–1.00)
Chloride: 92 mmol/L — ABNORMAL LOW (ref 101–111)
GFR calc non Af Amer: 59 mL/min — ABNORMAL LOW (ref >60)
GLUCOSE: 103 mg/dL — AB (ref 65–99)
POTASSIUM: 4.5 mmol/L (ref 3.5–5.1)
Sodium: 138 mmol/L (ref 135–145)

## 2015-03-15 LAB — CBC
HCT: 40.2 % (ref 36.0–46.0)
Hemoglobin: 12.2 g/dL (ref 12.0–15.0)
MCH: 31.2 pg (ref 26.0–34.0)
MCHC: 30.3 g/dL (ref 30.0–36.0)
MCV: 102.8 fL — AB (ref 78.0–100.0)
PLATELETS: 188 10*3/uL (ref 150–400)
RBC: 3.91 MIL/uL (ref 3.87–5.11)
RDW: 16.1 % — AB (ref 11.5–15.5)
WBC: 8.4 10*3/uL (ref 4.0–10.5)

## 2015-03-15 NOTE — Progress Notes (Signed)
[facility] PROGRESS NOTE  [name] XBW:[id number] DOB: 06-Feb-1951 DOA: 03/08/2015 PCP: Alvester Chou, NP  Assessment/Plan: #1 community acquired pneumonia Patient recently treated with doxycycline for community-acquired pneumonia. Patient was readmitted with worsening shortness of breath and the syncopal episode. Chest x-ray done on 03/09/2015 was concerning for typical infection plus or minus edema. Patient was placed back on Rocephin and azithromycin for community-acquired pneumonia and completed a course of antibiotic therapy. Clinical improvement. Patient has been assessed by speech therapy and recommended regular diet. Follow.  #2 syncope Unknown etiology. EKG done on admission did not have any ischemic changes. Cardiac enzymes were cycled were negative 3. Patient did not some QTC prolongation initially which had since resolved. Lower extremity Dopplers were negative for DVT. CT angiographic of the chest done on 03/10/2015 was negative for PE. Patient's Trazodone and zoloft were to be discontinued secondary to QTC prolongation. EKG from 03/09/2015 with no QTC prolongation. Continue trazodone and Zoloft. Repeat EKG. 2-D echo done showed a normal EF of 55-60% with LVH. 2-D echo was also consistent with pulmonary hypertension. Patient with no further syncopal episodes. Follow.  #3 acute on chronic diastolic heart failure Questionable etiology. Cardiac enzymes were negative 3. 2-D echo with EF of 55-60%. Patient with clinical improvement. Patient is -22.765 L during this hospitalization and -3 L over the past 24 hours. Continue beta blocker. Change IV Lasix to oral Lasix. Will likely need to follow-up with cardiology as outpatient.  #4 moderate pulmonary hypertension/obesity hypoventilation syndrome Per 2-D echo. Patient with a PA peak pressure of 60 mmhg. Per cardiology.  #5 morbid obesity  #6 hypokalemia Secondary to diureses. Replete.  #7 hypertension Stable. Continue current  regimen of Lasix, bystolic.  #8 paroxysmal atrial fibrillation Chads2Vasc score 3. Continue bystolic for rate control. Doses of beta blocker decreased secondary to pauses noted on telemetry. Continue xarelto for anticoagulation started on 03/11/2015.  #9 lymphedema Wound care consulted.  #10 obstructive sleep apnea C Pap daily at bedtime.  #11 prophylaxis Xarelto for DVT prophylaxis.   Code Status: DO NOT RESUSCITATE Family Communication: Updated patient. No family at bedside. Disposition Plan: Hopefully home tomorrow.    Consultants:  Cardiology: Dr. Fransico Him 03/10/2015  Procedures:  2-D echo 03/11/2015, 03/09/2015  Lower extremity Dopplers 03/09/2015  Chest x-ray and 03/09/2015  CT angio chest 03/10/2015  Antibiotics: None  HPI/Subjective: Patient denies any shortness of breath. Patient denies any chest pain.   Objective: Filed Vitals:   03/15/15 0619  BP: 121/53  Pulse: 81  Temp: 98.6 F (37 C)  Resp: 18    Intake/Output Summary (Last 24 hours) at 03/15/15 1409 Last data filed at 03/15/15 1215  Gross per 24 hour  Intake    240 ml  Output   3600 ml  Net  -3360 ml   Filed Weights   03/13/15 0559 03/14/15 0532 03/15/15 0619  Weight: 160.573 kg (354 lb) 163.749 kg (361 lb) 160.573 kg (354 lb)    Exam:   General:  NAD  Cardiovascular: Irregularly irregular  Respiratory: CTAB anterior lung fields.  Abdomen: Obese, soft, nontender, nondistended, positive bowel sounds.  Musculoskeletal: No clubbing cyanosis or edema.  Data Reviewed: Basic Metabolic Panel:  Recent Labs Lab 03/11/15 0511 03/12/15 0504 03/13/15 0431 03/14/15 0511 03/15/15 0550  NA 138 138 137 139 138  K 4.2 2.9* 3.3* 3.9 4.5  CL 98* 94* 93* 93* 92*  CO2 30 36* 37* 41* 37*  GLUCOSE 84 93 92 95 103*  BUN 10 11 12  12  14  CREATININE 1.22* 1.11* 0.94 1.03* 0.98  CALCIUM 8.8* 8.2* 8.2* 8.4* 8.8*  MG  --   --  1.4* 1.4* 2.3   Liver Function Tests:  Recent Labs Lab  03/09/15 0010 03/10/15 0510 03/11/15 0511 03/12/15 0504  AST 22 20 22  14*  ALT 10* 11* 14 10*  ALKPHOS 64 56 64 54  BILITOT 0.8 0.5 0.6 0.6  PROT 7.5 6.6 8.2* 6.7  ALBUMIN 2.8* 2.5* 3.0* 2.5*   No results for input(s): LIPASE, AMYLASE in the last 168 hours. No results for input(s): AMMONIA in the last 168 hours. CBC:  Recent Labs Lab 03/09/15 0010 03/10/15 0510 03/11/15 0511 03/12/15 0504 03/14/15 0511 03/15/15 0550  WBC 6.8 7.0 9.1 6.8 6.6 8.4  NEUTROABS 5.4  --   --   --   --   --   HGB 12.0 10.4* 13.0 10.5* 11.0* 12.2  HCT 38.4 34.9* 43.0 34.0* 36.8 40.2  MCV 101.3* 103.9* 106.7* 103.3* 103.4* 102.8*  PLT 245 193 189 160 162 188   Cardiac Enzymes:  Recent Labs Lab 03/09/15 1125 03/09/15 1700 03/09/15 2330  TROPONINI <0.03 <0.03 <0.03   BNP (last 3 results)  Recent Labs  03/09/15 0027  BNP 406.0*    ProBNP (last 3 results) No results for input(s): PROBNP in the last 8760 hours.  CBG: No results for input(s): GLUCAP in the last 168 hours.  No results found for this or any previous visit (from the past 240 hour(s)).   Studies: No results found.  Scheduled Meds: . antiseptic oral rinse  7 mL Mouth Rinse BID  . famotidine  20 mg Oral BID  . feeding supplement  1 Container Oral TID BM  . feeding supplement (PRO-STAT SUGAR FREE 64)  30 mL Oral BID  . furosemide  80 mg Oral BID  . gabapentin  300 mg Oral QHS  . nebivolol  2.5 mg Oral Daily  . rivaroxaban  20 mg Oral Q supper  . saccharomyces boulardii  250 mg Oral BID  . sertraline  50 mg Oral QHS  . sodium chloride  3 mL Intravenous Q12H   Continuous Infusions:   Principal Problem:   CHF exacerbation Active Problems:   BMI 70 and over, adult   Stasis dermatitis   Obstructive sleep apnea on CPAP   PAF (paroxysmal atrial fibrillation)   Cellulitis of left forearm   Lymphedema   Syncope   Essential hypertension   Acute diastolic CHF (congestive heart failure)   Faintness   Pressure  ulcer    Time spent: 35 minutes    [facility] MD [facility] Pager [phone]. If 7PM-7AM, please contact night-coverage at [email or web address], password [facility] 03/15/2015, 2:09 PM  LOS: 6 days

## 2015-03-15 NOTE — Progress Notes (Signed)
Nutrition Follow-up  DOCUMENTATION CODES:   Morbid obesity  INTERVENTION:  - Continue Boost Breeze TID and Prostat BID - RD will continue to monitor for needs  NUTRITION DIAGNOSIS:   Inadequate oral intake related to acute illness as evidenced by per patient/family report, meal completion < 50%. -resolving  GOAL:   Patient will meet greater than or equal to 90% of their needs -variably met  MONITOR:   PO intake, Supplement acceptance, Weight trends, Labs, I & O's  ASSESSMENT:   64 y.o. female with a history of PAF, HTN, OSA, Morbid Obesity, Lymphedema, [place] a remote history of Breast Cancer S/P Lumpectomy who presents to the ED with complaints of worsening SOB over the past 24 hours and syncope x 2 which occurred after she was repositioned in bed. She reports having a headache and feeling Dizzy before passing out. She was found by EMSs to have O2 sats of 85% and was placed on 4 liters of NCO2. She was discharged from the hospital on 09/15 for Cellulitis of the Left Forearm, and CAP Pneumonia and was discharged to home on Doxycyline Rx for 5 days.   9/23 Per chart review, pt consumed 75% lunch and 50% dinner yesterday. She states that she had a blueberry muffin and grits for breakfast this AM. She states appetite has still been decreased but that she has been forcing herself to eat because she knows she needs to to get stronger.  She states that [place] and dressing, which she had for dinner last night, was too salty; informed her this information would be passed on to Administrator, arts.  Pt has questions about Heart Healthy diet for when she goes home. Instructed her on low Na diet and low-fat diet. Informed her that items in the store that state "low-fat" should be avoided as they typically have increased sodium. She also asks about fluid restriction; instructed her to talk with MD about this due to Lasix order.   Variably meeting needs at this time. Medications reviewed.  Labs reviewed; Cl: 92 mmol/L, Ca: 8.8 mg/dL, GFR: 59.    9/19 - Per chart review, pt ate 100% dinner 9/17. She states that last night (9/18) she ate chicken breast and 1/2 portion of pinto beans.  - After intake she had diarrhea for multiple episodes.  - She tried a banana and rice krispies for breakfast with similar result, although not as "bad."  - She has been drinking fluid such as water, coffee, and orange juice to remain hydrated. - She states that from the time of recent d/c to re-hospitalization she had a good appetite and was eating well at home without much GI upset/issue.  - Encouraged pt to drink fluids as able and to try foods as she feels comfortable.  -No muscle or fat wasting but moderate edema noted. Review of chart indicates 3 lb weight gain in the past 12 days which may be fluid related.   Diet Order:  Diet Heart Room service appropriate?: Yes; Fluid consistency:: Thin  Skin:    Stage 2 pressure ulcer to L leg x2  Last BM:  9/22  Height:   Ht Readings from Last 1 Encounters:  03/09/15 '5\' 4"'  (1.626 m)    Weight:   Wt Readings from Last 1 Encounters:  03/15/15 354 lb (160.573 kg)    Ideal Body Weight:  54.54 kg (kg)  BMI:  Body mass index is 60.73 kg/(m^2).  Estimated Nutritional Needs:   Kcal:  2000-2200  Protein:  105-115 grams  Fluid:  1.8-2.2 L/day  EDUCATION NEEDS:   Education needs addressed     Jarome Matin, RD, LDN Inpatient Clinical Dietitian Pager # [phone] After hours/weekend pager # [phone]

## 2015-03-16 LAB — BASIC METABOLIC PANEL
ANION GAP: 10 (ref 5–15)
BUN: 15 mg/dL (ref 6–20)
CHLORIDE: 90 mmol/L — AB (ref 101–111)
CO2: 38 mmol/L — ABNORMAL HIGH (ref 22–32)
CREATININE: 1.06 mg/dL — AB (ref 0.44–1.00)
Calcium: 8.9 mg/dL (ref 8.9–10.3)
GFR calc non Af Amer: 53 mL/min — ABNORMAL LOW (ref >60)
Glucose, Bld: 101 mg/dL — ABNORMAL HIGH (ref 65–99)
Potassium: 4.1 mmol/L (ref 3.5–5.1)
SODIUM: 138 mmol/L (ref 135–145)

## 2015-03-16 MED ORDER — RIVAROXABAN 20 MG PO TABS: 20.0000 mg | ORAL_TABLET | Freq: Every day | ORAL | Status: AC

## 2015-03-16 MED ORDER — POTASSIUM CHLORIDE ER 20 MEQ PO TBCR: 40.0000 meq | EXTENDED_RELEASE_TABLET | Freq: Two times a day (BID) | ORAL | Status: AC

## 2015-03-16 MED ORDER — NEBIVOLOL HCL 2.5 MG PO TABS: 2.5000 mg | ORAL_TABLET | Freq: Every day | ORAL | Status: AC

## 2015-03-16 MED ORDER — PRO-STAT SUGAR FREE PO LIQD: 30.0000 mL | Freq: Two times a day (BID) | ORAL | Status: AC

## 2015-03-16 MED ORDER — BOOST / RESOURCE BREEZE PO LIQD: 1.0000 | Freq: Three times a day (TID) | ORAL | Status: AC

## 2015-03-16 MED ORDER — FUROSEMIDE 80 MG PO TABS: 80.0000 mg | ORAL_TABLET | Freq: Two times a day (BID) | ORAL | Status: AC

## 2015-03-16 NOTE — Discharge Summary (Signed)
Physician Discharge Summary  [name] OH[place]:[id number] DOB: 1950/08/25 DOA: 03/08/2015  PCP: Alvester Chou, NP  Admit date: 03/08/2015 Discharge date: 03/16/2015  Time spent: 70 minutes  Recommendations for Outpatient Follow-up:  1. Follow-up with Alvester Chou, NP in 1 week. On follow-up patient in need a CBC done to follow-up on her hemoglobin. Patient need a basic metabolic profile done to follow-up on electrolytes and renal function. 2. Follow-up with Dr. Fransico Him cardiology in 2 weeks.  Discharge Diagnoses:  Principal Problem:   CHF exacerbation Active Problems:   Syncope   Acute diastolic CHF (congestive heart failure)   PAF (paroxysmal atrial fibrillation)   BMI 70 and over, adult   Stasis dermatitis   Obstructive sleep apnea on CPAP   Cellulitis of left forearm   CAP (community acquired pneumonia)   Lymphedema   Essential hypertension   Faintness   Pressure ulcer   Discharge Condition: Stable and improved  Diet recommendation: Heart healthy.  Filed Weights   03/14/15 0532 03/15/15 0619 03/16/15 0500  Weight: 163.749 kg (361 lb) 160.573 kg (354 lb) 156.945 kg (346 lb)    History of present illness:  Per Dr [name] is a 64 y.o. female with a history of PAF, HTN, OSA, Morbid Obesity, Lymphedema, Morbid obesity and a remote history of Breast Cancer S/P Lumpectomy who presented to the ED with complaints of worsening SOB over the past 24 hours and syncope x 2 which occurred after she was repositioned in bed. She reported having a headache and feeling Dizzy before passing out. She was found by EMSs to have O2 sats of 85% and was placed on 4 liters of [place] O2. She was discharged from the hospital on 09/15 for Cellulitis of the Left Forearm, and CAP Pneumonia and was discharged to home on Doxycyline Rx for 5 days.   In the ED, she was found to have pulmonary edema on chest X-Ray and was initially administered 40 mg of IV Lasix x 1 and referred for  admission.    Hospital Course:  #1 community acquired pneumonia Patient recently treated with doxycycline for community-acquired pneumonia. Patient was readmitted with worsening shortness of breath and the syncopal episode. Chest x-ray done on 03/09/2015 was concerning for typical infection plus or minus edema. Patient was placed back on Rocephin and azithromycin for community-acquired pneumonia and completed a course of antibiotic therapy. Clinical improvement. Patient has been assessed by speech therapy and recommended regular diet. Outpatient follow-up.  #2 syncope Unknown etiology. EKG done on admission did not have any ischemic changes. Cardiac enzymes were cycled were negative 3. Patient did not some QTC prolongation initially which had since resolved. Lower extremity Dopplers were negative for DVT. CT angiogram of the chest done on 03/10/2015 was negative for PE. Patient's Trazodone and zoloft were to be discontinued secondary to QTC prolongation. EKG from 03/09/2015 with no QTC prolongation. Continued on trazodone and Zoloft. Repeat EKG with no QTc prolongation. 2-D echo done showed a normal EF of 55-60% with LVH. 2-D echo was also consistent with pulmonary hypertension. Patient with no further syncopal episodes. Outpatient follow-up.  #3 acute on chronic diastolic heart failure Patient was admitted with shortness of breath and noted to have pulmonary edema by chest x-ray and elevated BNP. Patient was placed on IV diuretics and placed on a telemetry bed. Cardiology was consulted and followed the patient during the hospitalization. Cardiac enzymes were negative 3. 2-D echo with EF of 55-60%. Patient improved clinically and was on Lasix  60 mg IV 3 times a day. Patient was subsequently transitioned to oral Lasix of 80 mg orally twice daily. Patient diuresed -24.4 L during this hospitalization. Patient was also maintained on a beta blocker. Patient's oxygenation improved by day of discharge  patient was 93% on room air. On day of discharge patient was 156.945 kg. Patient will be discharged home on oral Lasix and is to follow-up with PCP and cardiology as outpatient.   #4 moderate pulmonary hypertension/obesity hypoventilation syndrome Per 2-D echo. Patient with a PA peak pressure of 60 mmhg. patient was assessed by cardiology and was felt patient's moderate pulmonary hypertension was likely secondary to her morbid obesity with obesity hypoventilation syndrome and obstructive sleep apnea. Outpatient follow-up.   #5 morbid obesity  #6 hypokalemia Secondary to diureses. Repleted.  #7 hypertension Stable. Patient was maintained on bystolic and Lasix.  #8 paroxysmal atrial fibrillation Patient was noted to have a history of partial small atrial fibrillation not on any anticoagulation. Patient had recommended anticoagulation several years ago however she declined due to prior history of rectal bleeding. Patient had been on xarelto for short period of time for DVT in October 2014. Chads2Vasc score 3. Patient's beta blocker dose was decreased during the hospitalization secondary to some episodes of bradycardia which occurred during sleep as well as pauses less than 2 seconds in duration. Patient was also subsequently started on xarelto for anticoagulation on 03/11/2015. Patient had no evidence of bleeding and patient be discharged on Xarelto. Patient will follow-up with PCP as outpatient.  #9 lymphedema Wound care consulted.  #10 obstructive sleep apnea Patient was placed on CPAP daily at bedtime.   Procedures:  2-D echo 03/11/2015, 03/09/2015  Lower extremity Dopplers 03/09/2015  Chest x-ray and 03/09/2015  CT angio chest 03/10/2015  Consultations:  Cardiology: Dr. Fransico Him 03/10/2015    Discharge Exam: Filed Vitals:   03/16/15 0555  BP: 117/76  Pulse: 59  Temp: 97.6 F (36.4 C)  Resp: 18    General: NAD Cardiovascular: Irregularly irregular Respiratory:  Clear to auscultation bilaterally anterior lung fields  Discharge Instructions   Discharge Instructions    Diet - low sodium heart healthy    Complete by:  As directed      Discharge instructions    Complete by:  As directed   Follow up with Alvester Chou, NP in 1 week. Follow up with Dr Fransico Him, Cardiology in 2 weeks.     Increase activity slowly    Complete by:  As directed           Current Discharge Medication List    START taking these medications   Details  Amino Acids-Protein Hydrolys (FEEDING SUPPLEMENT, PRO-STAT SUGAR FREE 64,) LIQD Take 30 mLs by mouth 2 (two) times daily. Qty: 900 mL, Refills: 0    feeding supplement (BOOST / RESOURCE BREEZE) LIQD Take 1 Container by mouth 3 (three) times daily between meals. Refills: 0    furosemide (LASIX) 80 MG tablet Take 1 tablet (80 mg total) by mouth 2 (two) times daily. Qty: 62 tablet, Refills: 0    potassium chloride 20 MEQ TBCR Take 40 mEq by mouth 2 (two) times daily. Qty: 120 tablet, Refills: 0    rivaroxaban (XARELTO) 20 MG TABS tablet Take 1 tablet (20 mg total) by mouth daily with supper. Qty: 30 tablet, Refills: 0      CONTINUE these medications which have CHANGED   Details  nebivolol (BYSTOLIC) 2.5 MG tablet Take 1 tablet (2.5 mg total)  by mouth daily. Qty: 30 tablet, Refills: 0      CONTINUE these medications which have NOT CHANGED   Details  aspirin EC 81 MG tablet Take 1 tablet (81 mg total) by mouth daily.    diphenhydrAMINE (BENADRYL) 25 mg capsule Take 1 capsule (25 mg total) by mouth every 6 (six) hours as needed for allergies. Qty: 30 capsule, Refills: 0    famotidine (PEPCID) 20 MG tablet Take 1 tablet (20 mg total) by mouth 2 (two) times daily. Qty: 60 tablet, Refills: 0    gabapentin (NEURONTIN) 300 MG capsule Take 1 capsule (300 mg total) by mouth at bedtime as needed (nerve pain). Qty: 30 capsule, Refills: 0    loperamide (IMODIUM A-D) 2 MG tablet Take 2 mg by mouth 4 (four) times  daily as needed for diarrhea or loose stools.    LORazepam (ATIVAN) 0.5 MG tablet Take 0.25 mg by mouth every 12 (twelve) hours as needed for anxiety.    saccharomyces boulardii (FLORASTOR) 250 MG capsule Take 1 capsule (250 mg total) by mouth 2 (two) times daily. Qty: 60 capsule, Refills: 0    sertraline (ZOLOFT) 50 MG tablet TK 1 T PO  QHS. Refills: 4    traZODone (DESYREL) 100 MG tablet TK 1 T PO  HS. Refills: 3    albuterol (PROVENTIL) (2.5 MG/3ML) 0.083% nebulizer solution Take 3 mLs (2.5 mg total) by nebulization every 6 (six) hours as needed for wheezing or shortness of breath. Qty: 75 mL, Refills: 0      STOP taking these medications     doxycycline (VIBRA-TABS) 100 MG tablet      fluconazole (DIFLUCAN) 200 MG tablet        Allergies  Allergen Reactions  . Effexor [Venlafaxine Hydrochloride] Anxiety    Made her want to kill herself   Follow-up Information    Follow up with Alvester Chou, NP. Schedule an appointment as soon as possible for a visit in 1 week.   Specialty:  Nurse Practitioner   Contact information:   [facility] [place] [id number] [phone]       Follow up with Sueanne Margarita, MD. Schedule an appointment as soon as possible for a visit in 2 weeks.   Specialty:  Cardiology   Contact information:   [address] [place] [address] [phone]        The results of significant diagnostics from this hospitalization (including imaging, microbiology, ancillary and laboratory) are listed below for reference.    Significant Diagnostic Studies: Dg Chest 2 View  02/27/2015   CLINICAL DATA:  Persistent cough for several months  EXAM: CHEST - 2 VIEW  COMPARISON:  03/08/2013  FINDINGS: Cardiac shadow is within normal limits. Increased density is noted along the medial aspect of the right lung base. A portion of this is related to the patient's body habitus although an early infiltrate cannot  be excluded. The lateral images are of little utility due to the patient's body habitus.  IMPRESSION: Changes suggestive of early infiltrate in the medial right lung base.   Electronically Signed   By: Inez Catalina M.D.   On: 02/27/2015 17:03   Ct Angio Chest Pe W/cm &/or Wo Cm  03/10/2015   CLINICAL DATA:  Progressive shortness of breath. Recent pneumonia. Distant history of breast carcinoma.  EXAM: CT ANGIOGRAPHY CHEST WITH CONTRAST  TECHNIQUE: Multidetector CT imaging of the chest was performed using the standard protocol  during bolus administration of intravenous contrast. Multiplanar CT image reconstructions and MIPs were obtained to evaluate the vascular anatomy.  CONTRAST:  159mL OMNIPAQUE IOHEXOL 350 MG/ML SOLN  COMPARISON:  Chest radiograph, 03/09/2015  FINDINGS: Angiographic study: Exam somewhat limited by respiratory motion. Allowing for this, there is no evidence of a pulmonary embolus. Great vessels normal in caliber. No aortic dissection.  Thoracic inlet:  No mass or adenopathy.  Mediastinum and hila: Mild enlargement of the heart. Mild coronary artery calcifications. No mediastinal or hilar masses or pathologically enlarged lymph nodes.  Lungs and pleura: Moderate right and small left pleural effusions. Multiple areas of hazy ground-glass opacity with associated coarse reticular and linear opacity. Mild interstitial thickening seen more diffusely. No pneumothorax. No lung mass or suspicious nodule.  Limited upper abdomen:  No acute finding.  Musculoskeletal:  No osteoblastic or osteolytic lesions.  Review of the MIP images confirms the above findings.  IMPRESSION: 1. No evidence of a pulmonary embolus. 2. Findings consistent with congestive heart failure with cardiomegaly, moderate right and small left pleural effusions interstitial thickening. There is also linear and reticular opacity areas of hazy airspace opacity which may be due to atelectasis and small airways disease. Infection is possible.    Electronically Signed   By: Lajean Manes M.D.   On: 03/10/2015 17:50   Dg Chest Port 1 View  03/09/2015   CLINICAL DATA:  Repeated syncopal episodes. History of morbid obesity, hypertension, RIGHT breast cancer, chronic bronchitis, shortness of breath, RIGHT leg DVT.  EXAM: PORTABLE CHEST - 1 VIEW  COMPARISON:  Chest radiograph February 27, 2015  FINDINGS: The cardiac silhouette is mildly enlarged, unchanged. Calcified aortic knob. Diffuse interstitial prominence, patchy mid and lower lung zone alveolar airspace opacities with air bronchograms. No pleural effusion. No pneumothorax. RIGHT chest wall surgical clips and coarse calcifications projecting RIGHT breast. Severe degenerative change of the shoulders, widened RIGHT AC joint suggests old separation.  IMPRESSION: Stable cardiomegaly. Interstitial prominence could represent pulmonary edema or atypical infection with increasing mid and lower lung zone confluent edema or pneumonia. Recommend after treatment to verify improvement.   Electronically Signed   By: Elon Alas M.D.   On: 03/09/2015 01:07    Microbiology: No results found for this or any previous visit (from the past 240 hour(s)).   Labs: Basic Metabolic Panel:  Recent Labs Lab 03/12/15 0504 03/13/15 0431 03/14/15 0511 03/15/15 0550 03/16/15 0523  NA 138 137 139 138 138  K 2.9* 3.3* 3.9 4.5 4.1  CL 94* 93* 93* 92* 90*  CO2 36* 37* 41* 37* 38*  GLUCOSE 93 92 95 103* 101*  BUN 11 12 12 14 15   CREATININE 1.11* 0.94 1.03* 0.98 1.06*  CALCIUM 8.2* 8.2* 8.4* 8.8* 8.9  MG  --  1.4* 1.4* 2.3  --    Liver Function Tests:  Recent Labs Lab 03/10/15 0510 03/11/15 0511 03/12/15 0504  AST 20 22 14*  ALT 11* 14 10*  ALKPHOS 56 64 54  BILITOT 0.5 0.6 0.6  PROT 6.6 8.2* 6.7  ALBUMIN 2.5* 3.0* 2.5*   No results for input(s): LIPASE, AMYLASE in the last 168 hours. No results for input(s): AMMONIA in the last 168 hours. CBC:  Recent Labs Lab 03/10/15 0510  03/11/15 0511 03/12/15 0504 03/14/15 0511 03/15/15 0550  WBC 7.0 9.1 6.8 6.6 8.4  HGB 10.4* 13.0 10.5* 11.0* 12.2  HCT 34.9* 43.0 34.0* 36.8 40.2  MCV 103.9* 106.7* 103.3* 103.4* 102.8*  PLT 193 189 160 162  188   Cardiac Enzymes:  Recent Labs Lab 03/09/15 1125 03/09/15 1700 03/09/15 2330  TROPONINI <0.03 <0.03 <0.03   BNP: BNP (last 3 results)  Recent Labs  03/09/15 0027  BNP 406.0*    ProBNP (last 3 results) No results for input(s): PROBNP in the last 8760 hours.  CBG: No results for input(s): GLUCAP in the last 168 hours.     SignedIrine Seal M.D. [facility] 03/16/2015, 10:54 AM

## 2015-03-16 NOTE — Clinical Social Work Note (Signed)
CSW received a call from RN requesting ambulance transport home for pt after 4:30  CSW called [facility] and provided information to pick up pt for transport home by 4:30 or after  CSW prepared discharge packet and provided to RN  .Dede Query, LCSW [facility] Clinical Social Worker - Weekend Coverage cell #: [phone]

## 2016-07-16 ENCOUNTER — Emergency Department (HOSPITAL_COMMUNITY): Payer: Medicare Other

## 2016-07-16 ENCOUNTER — Encounter (HOSPITAL_COMMUNITY): Payer: Self-pay | Admitting: Emergency Medicine

## 2016-07-16 ENCOUNTER — Inpatient Hospital Stay (HOSPITAL_COMMUNITY)
Admission: EM | Admit: 2016-07-16 | Discharge: 2016-08-20 | DRG: 207 | Disposition: E | Payer: Medicare Other | Attending: Internal Medicine | Admitting: Internal Medicine

## 2016-07-16 DIAGNOSIS — Z86718 Personal history of other venous thrombosis and embolism: Secondary | ICD-10-CM

## 2016-07-16 DIAGNOSIS — E875 Hyperkalemia: Secondary | ICD-10-CM | POA: Diagnosis present

## 2016-07-16 DIAGNOSIS — G9340 Encephalopathy, unspecified: Secondary | ICD-10-CM | POA: Clinically undetermined

## 2016-07-16 DIAGNOSIS — T502X5A Adverse effect of carbonic-anhydrase inhibitors, benzothiadiazides and other diuretics, initial encounter: Secondary | ICD-10-CM | POA: Diagnosis present

## 2016-07-16 DIAGNOSIS — E162 Hypoglycemia, unspecified: Secondary | ICD-10-CM | POA: Diagnosis present

## 2016-07-16 DIAGNOSIS — N390 Urinary tract infection, site not specified: Secondary | ICD-10-CM | POA: Diagnosis present

## 2016-07-16 DIAGNOSIS — K219 Gastro-esophageal reflux disease without esophagitis: Secondary | ICD-10-CM | POA: Diagnosis present

## 2016-07-16 DIAGNOSIS — T45516A Underdosing of anticoagulants, initial encounter: Secondary | ICD-10-CM | POA: Diagnosis present

## 2016-07-16 DIAGNOSIS — R278 Other lack of coordination: Secondary | ICD-10-CM | POA: Diagnosis not present

## 2016-07-16 DIAGNOSIS — I200 Unstable angina: Secondary | ICD-10-CM

## 2016-07-16 DIAGNOSIS — R531 Weakness: Secondary | ICD-10-CM | POA: Diagnosis not present

## 2016-07-16 DIAGNOSIS — T68XXXA Hypothermia, initial encounter: Secondary | ICD-10-CM | POA: Diagnosis not present

## 2016-07-16 DIAGNOSIS — N19 Unspecified kidney failure: Secondary | ICD-10-CM | POA: Diagnosis present

## 2016-07-16 DIAGNOSIS — Z87891 Personal history of nicotine dependence: Secondary | ICD-10-CM

## 2016-07-16 DIAGNOSIS — I5033 Acute on chronic diastolic (congestive) heart failure: Secondary | ICD-10-CM | POA: Diagnosis present

## 2016-07-16 DIAGNOSIS — K859 Acute pancreatitis without necrosis or infection, unspecified: Secondary | ICD-10-CM | POA: Diagnosis present

## 2016-07-16 DIAGNOSIS — I959 Hypotension, unspecified: Secondary | ICD-10-CM | POA: Diagnosis not present

## 2016-07-16 DIAGNOSIS — R5383 Other fatigue: Secondary | ICD-10-CM

## 2016-07-16 DIAGNOSIS — N170 Acute kidney failure with tubular necrosis: Secondary | ICD-10-CM | POA: Diagnosis present

## 2016-07-16 DIAGNOSIS — I4581 Long QT syndrome: Secondary | ICD-10-CM | POA: Diagnosis present

## 2016-07-16 DIAGNOSIS — R579 Shock, unspecified: Secondary | ICD-10-CM | POA: Diagnosis not present

## 2016-07-16 DIAGNOSIS — R58 Hemorrhage, not elsewhere classified: Secondary | ICD-10-CM | POA: Diagnosis present

## 2016-07-16 DIAGNOSIS — Z9841 Cataract extraction status, right eye: Secondary | ICD-10-CM

## 2016-07-16 DIAGNOSIS — Z888 Allergy status to other drugs, medicaments and biological substances status: Secondary | ICD-10-CM

## 2016-07-16 DIAGNOSIS — G4733 Obstructive sleep apnea (adult) (pediatric): Secondary | ICD-10-CM

## 2016-07-16 DIAGNOSIS — J9621 Acute and chronic respiratory failure with hypoxia: Secondary | ICD-10-CM | POA: Diagnosis present

## 2016-07-16 DIAGNOSIS — J189 Pneumonia, unspecified organism: Principal | ICD-10-CM | POA: Diagnosis present

## 2016-07-16 DIAGNOSIS — Z6841 Body Mass Index (BMI) 40.0 and over, adult: Secondary | ICD-10-CM

## 2016-07-16 DIAGNOSIS — Z9989 Dependence on other enabling machines and devices: Secondary | ICD-10-CM

## 2016-07-16 DIAGNOSIS — E871 Hypo-osmolality and hyponatremia: Secondary | ICD-10-CM | POA: Diagnosis present

## 2016-07-16 DIAGNOSIS — Z7901 Long term (current) use of anticoagulants: Secondary | ICD-10-CM

## 2016-07-16 DIAGNOSIS — E872 Acidosis, unspecified: Secondary | ICD-10-CM | POA: Diagnosis present

## 2016-07-16 DIAGNOSIS — Z961 Presence of intraocular lens: Secondary | ICD-10-CM | POA: Diagnosis present

## 2016-07-16 DIAGNOSIS — I110 Hypertensive heart disease with heart failure: Secondary | ICD-10-CM | POA: Diagnosis present

## 2016-07-16 DIAGNOSIS — R109 Unspecified abdominal pain: Secondary | ICD-10-CM

## 2016-07-16 DIAGNOSIS — R079 Chest pain, unspecified: Secondary | ICD-10-CM

## 2016-07-16 DIAGNOSIS — Z8249 Family history of ischemic heart disease and other diseases of the circulatory system: Secondary | ICD-10-CM

## 2016-07-16 DIAGNOSIS — F329 Major depressive disorder, single episode, unspecified: Secondary | ICD-10-CM | POA: Diagnosis present

## 2016-07-16 DIAGNOSIS — Z853 Personal history of malignant neoplasm of breast: Secondary | ICD-10-CM

## 2016-07-16 DIAGNOSIS — L304 Erythema intertrigo: Secondary | ICD-10-CM | POA: Diagnosis present

## 2016-07-16 DIAGNOSIS — K529 Noninfective gastroenteritis and colitis, unspecified: Secondary | ICD-10-CM | POA: Diagnosis present

## 2016-07-16 DIAGNOSIS — N179 Acute kidney failure, unspecified: Secondary | ICD-10-CM

## 2016-07-16 DIAGNOSIS — J9690 Respiratory failure, unspecified, unspecified whether with hypoxia or hypercapnia: Secondary | ICD-10-CM

## 2016-07-16 DIAGNOSIS — Z7401 Bed confinement status: Secondary | ICD-10-CM

## 2016-07-16 DIAGNOSIS — D649 Anemia, unspecified: Secondary | ICD-10-CM | POA: Diagnosis present

## 2016-07-16 DIAGNOSIS — B369 Superficial mycosis, unspecified: Secondary | ICD-10-CM | POA: Diagnosis present

## 2016-07-16 DIAGNOSIS — B379 Candidiasis, unspecified: Secondary | ICD-10-CM | POA: Diagnosis present

## 2016-07-16 DIAGNOSIS — E876 Hypokalemia: Secondary | ICD-10-CM | POA: Diagnosis not present

## 2016-07-16 DIAGNOSIS — F418 Other specified anxiety disorders: Secondary | ICD-10-CM | POA: Diagnosis present

## 2016-07-16 DIAGNOSIS — J9601 Acute respiratory failure with hypoxia: Secondary | ICD-10-CM

## 2016-07-16 DIAGNOSIS — Z9289 Personal history of other medical treatment: Secondary | ICD-10-CM

## 2016-07-16 DIAGNOSIS — R072 Precordial pain: Secondary | ICD-10-CM

## 2016-07-16 DIAGNOSIS — I872 Venous insufficiency (chronic) (peripheral): Secondary | ICD-10-CM | POA: Diagnosis present

## 2016-07-16 DIAGNOSIS — A419 Sepsis, unspecified organism: Secondary | ICD-10-CM | POA: Diagnosis not present

## 2016-07-16 DIAGNOSIS — Z992 Dependence on renal dialysis: Secondary | ICD-10-CM

## 2016-07-16 DIAGNOSIS — I480 Paroxysmal atrial fibrillation: Secondary | ICD-10-CM | POA: Diagnosis present

## 2016-07-16 DIAGNOSIS — Z66 Do not resuscitate: Secondary | ICD-10-CM | POA: Diagnosis present

## 2016-07-16 DIAGNOSIS — T4275XA Adverse effect of unspecified antiepileptic and sedative-hypnotic drugs, initial encounter: Secondary | ICD-10-CM | POA: Diagnosis present

## 2016-07-16 DIAGNOSIS — E860 Dehydration: Secondary | ICD-10-CM | POA: Diagnosis present

## 2016-07-16 DIAGNOSIS — M21379 Foot drop, unspecified foot: Secondary | ICD-10-CM | POA: Diagnosis present

## 2016-07-16 DIAGNOSIS — Z90710 Acquired absence of both cervix and uterus: Secondary | ICD-10-CM

## 2016-07-16 DIAGNOSIS — Y95 Nosocomial condition: Secondary | ICD-10-CM | POA: Diagnosis present

## 2016-07-16 DIAGNOSIS — I10 Essential (primary) hypertension: Secondary | ICD-10-CM | POA: Diagnosis present

## 2016-07-16 DIAGNOSIS — F32A Depression, unspecified: Secondary | ICD-10-CM | POA: Diagnosis present

## 2016-07-16 DIAGNOSIS — B9620 Unspecified Escherichia coli [E. coli] as the cause of diseases classified elsewhere: Secondary | ICD-10-CM | POA: Diagnosis present

## 2016-07-16 DIAGNOSIS — Z515 Encounter for palliative care: Secondary | ICD-10-CM | POA: Diagnosis present

## 2016-07-16 DIAGNOSIS — J9622 Acute and chronic respiratory failure with hypercapnia: Secondary | ICD-10-CM | POA: Diagnosis present

## 2016-07-16 DIAGNOSIS — Z79899 Other long term (current) drug therapy: Secondary | ICD-10-CM

## 2016-07-16 LAB — CBC WITH DIFFERENTIAL/PLATELET
Basophils Absolute: 0 10*3/uL (ref 0.0–0.1)
Basophils Relative: 0 %
Eosinophils Absolute: 0.2 10*3/uL (ref 0.0–0.7)
Eosinophils Relative: 1 %
HCT: 30.9 % — ABNORMAL LOW (ref 36.0–46.0)
Hemoglobin: 10.1 g/dL — ABNORMAL LOW (ref 12.0–15.0)
Lymphocytes Relative: 9 %
Lymphs Abs: 1.2 10*3/uL (ref 0.7–4.0)
MCH: 31.9 pg (ref 26.0–34.0)
MCHC: 32.7 g/dL (ref 30.0–36.0)
MCV: 97.5 fL (ref 78.0–100.0)
Monocytes Absolute: 0.8 10*3/uL (ref 0.1–1.0)
Monocytes Relative: 5 %
Neutro Abs: 11.7 10*3/uL — ABNORMAL HIGH (ref 1.7–7.7)
Neutrophils Relative %: 85 %
Platelets: 211 10*3/uL (ref 150–400)
RBC: 3.17 MIL/uL — ABNORMAL LOW (ref 3.87–5.11)
RDW: 16.9 % — ABNORMAL HIGH (ref 11.5–15.5)
WBC: 13.9 10*3/uL — ABNORMAL HIGH (ref 4.0–10.5)

## 2016-07-16 LAB — CBG MONITORING, ED
GLUCOSE-CAPILLARY: 63 mg/dL — AB (ref 65–99)
Glucose-Capillary: 43 mg/dL — CL (ref 65–99)
Glucose-Capillary: 40 mg/dL — CL (ref 65–99)

## 2016-07-16 MED ORDER — DEXTROSE 50 % IV SOLN
1.0000 | Freq: Once | INTRAVENOUS | Status: AC
  Administered 2016-07-16: 50 mL via INTRAVENOUS
  Filled 2016-07-16: qty 50

## 2016-07-16 NOTE — ED Triage Notes (Signed)
Patient BIB [facility] from home for generalized weakness. patient reports feeling increasingly weak x1wk. Reports lack of appetite and not drinking fluids. Low energy and hypotensive. Respirations elevated 18-28. Initial cbg 55, no iv access, EMS gave 15mg   insta-glucose. EMS recheck CBG 64. Yeast and breakdown noted under arms and breasts. Decalcification spots on left side and rt breast. Patient has home health  And is a/o x4.

## 2016-07-16 NOTE — ED Notes (Signed)
Bed: [facility] Expected date:  Expected time:  Means of arrival:  Comments: EMS 66 yo female from home/generalized weakness for 1 week-BP 90/60

## 2016-07-16 NOTE — ED Notes (Signed)
Pt. CBG 43. RN,Emily made aware. Pt. Given 2 orange juice.

## 2016-07-16 NOTE — ED Provider Notes (Signed)
[facility] DEPT Provider Note   CSN: [id number] Arrival date & time: 07/06/2016  2018  By signing my name below, I, Jeanell Sparrow, attest that this documentation has been prepared under the direction and in the presence of non-physician practitioner, Armstead Peaks, PA-C. Electronically Signed: Jeanell Sparrow, Scribe. 07/14/2016. 9:09 PM.  History   Chief Complaint Chief Complaint  Patient presents with  . Weakness   The history is provided by the patient. No language interpreter was used.   HPI Comments: [name] is a 66 y.o. female who presents to the Emergency Department complaining of generalized weakness that started about 2 days ago. No modifying factors. She reports associated cough and SOB. She describes the cough as productive of dark brown sputum. She is non-ambulatory at baseline. She denies any recent use of antibiotics, hx of asthma, hx of DM, fever, abdominal pain, chest pain, abdominal pain, nausea, vomiting, or decreased appetite.     PCP: Alvester Chou, NP  Past Medical History:  Diagnosis Date  . Anxiety   . Arthritis    "knees; fingers/hands" (04/07/2013)  . Breast cancer ([place]) 1988  . Calcification of right breast    chronic s/p WLE/XRT   . Cellulitis    right breast, right arm, ble  . Chronic bronchitis ([place])    "maybe 4 years in a row; last time was ~1990" (04/07/2013)  . Depression   . Dysrhythmia   . Exertional shortness of breath   . Hypertension   . Lymphedema of arm 2008, 2012   s/p Axillary LN dissection 1988 w Lumpectomy  . Obesities, morbid ([place])   . Obstructive sleep apnea on CPAP 06/04/2012  . PAF (paroxysmal atrial fibrillation) ([place]) 06/04/2012  . Pneumonia    "several times; last time was in the 1990's" (04/07/2013)  . Right leg DVT ([place]) 04/06/2013   "behind my knee" (04/07/2013)  . Stasis dermatitis     Patient Active Problem List   Diagnosis Date Noted  . Acute on chronic diastolic congestive heart failure ([place]) 07/17/2016  .  Hypoglycemia 07/17/2016  . Generalized weakness 07/17/2016  . Chest pain 07/17/2016  . Pancreatitis 07/17/2016  . Pressure ulcer 03/10/2015  . CHF exacerbation ([place]) 03/09/2015  . Syncope 03/09/2015  . Essential hypertension 03/09/2015  . Faintness   . Lobar pneumonia due to unspecified organism 03/06/2015  . Hyperkalemia 03/06/2015  . Dermatitis fungal 03/06/2015  . Non-infective diarrhea 03/06/2015  . Depression   . Lobar pneumonia ([place]) 03/05/2015  . Functional quadriplegia ([place]) 03/01/2015  . Cellulitis of left forearm 02/27/2015  . CAP (community acquired pneumonia) 02/27/2015  . Lymphedema 02/27/2015  . Cellulitis of female breast 04/08/2013  . Obstructive sleep apnea on CPAP 06/04/2012  . PAF (paroxysmal atrial fibrillation) ([place]) 06/04/2012  . Sepsis ([place]) 06/03/2012  . BMI 70 and over, adult ([place]) 07/01/2011  . HTN (hypertension) 07/01/2011  . Stasis dermatitis 07/01/2011    Past Surgical History:  Procedure Laterality Date  . ABDOMINAL HYSTERECTOMY  1991  . AXILLARY LYMPH NODE DISSECTION Right 1988  . BREAST LUMPECTOMY Right 1988  . BREAST SURGERY Right 10/2011   "cut out calcification" (04/07/2013)  . CARPAL TUNNEL RELEASE Right 1995  . CATARACT EXTRACTION W/ INTRAOCULAR LENS IMPLANT Right ~ 2006  . WOUND DEBRIDEMENT  09/03/2011   Procedure: DEBRIDEMENT WOUND;  Surgeon: Odis Hollingshead, MD;  Location: [facility] ORS;  Service: General;  Laterality: Right;    OB History    No data available       Home Medications  Prior to Admission medications   Medication Sig Start Date End Date Taking? Authorizing Provider  ALPRAZolam Duanne Moron) 1 MG tablet Take 1 mg by mouth 2 (two) times daily. 04/23/16  Yes Historical Provider, MD  Amino Acids-Protein Hydrolys (FEEDING SUPPLEMENT, PRO-STAT SUGAR FREE 64,) LIQD Take 30 mLs by mouth 2 (two) times daily. 03/16/15  Yes Eugenie Filler, MD  apixaban (ELIQUIS) 5 MG TABS tablet Take 5 mg by mouth 2 (two) times daily.   Yes  Historical Provider, MD  diphenhydrAMINE (BENADRYL) 25 mg capsule Take 1 capsule (25 mg total) by mouth every 6 (six) hours as needed for allergies. 03/13/13  Yes Orson Eva, MD  furosemide (LASIX) 80 MG tablet Take 1 tablet (80 mg total) by mouth 2 (two) times daily. 03/16/15  Yes Eugenie Filler, MD  gabapentin (NEURONTIN) 300 MG capsule Take 1 capsule (300 mg total) by mouth at bedtime as needed (nerve pain). Patient taking differently: Take 300 mg by mouth at bedtime.  03/13/13  Yes Orson Eva, MD  HYDROcodone-acetaminophen (NORCO/VICODIN) 5-325 MG tablet Take 1 tablet by mouth every 6 (six) hours as needed for pain. 07/08/16  Yes Historical Provider, MD  loperamide (IMODIUM A-D) 2 MG tablet Take 2 mg by mouth 4 (four) times daily as needed for diarrhea or loose stools.   Yes Historical Provider, MD  LORazepam (ATIVAN) 0.5 MG tablet Take 0.25 mg by mouth every 12 (twelve) hours as needed for anxiety.   Yes Historical Provider, MD  nebivolol (BYSTOLIC) 2.5 MG tablet Take 1 tablet (2.5 mg total) by mouth daily. 03/16/15  Yes Eugenie Filler, MD  pantoprazole (PROTONIX) 20 MG tablet Take 20 mg by mouth daily. 07/02/16  Yes Historical Provider, MD  potassium chloride 20 MEQ TBCR Take 40 mEq by mouth 2 (two) times daily. 03/16/15  Yes Eugenie Filler, MD  saccharomyces boulardii (FLORASTOR) 250 MG capsule Take 1 capsule (250 mg total) by mouth 2 (two) times daily. 03/07/15  Yes Robbie Lis, MD  sertraline (ZOLOFT) 50 MG tablet TK 1 T PO  QHS. 01/21/15  Yes Historical Provider, MD  tiZANidine (ZANAFLEX) 2 MG tablet Take 2 mg by mouth 3 (three) times daily as needed for muscle spasms. 07/03/16  Yes Historical Provider, MD  traZODone (DESYREL) 100 MG tablet TK 1 T PO  HS. 02/09/15  Yes Historical Provider, MD  albuterol (PROVENTIL) (2.5 MG/3ML) 0.083% nebulizer solution Take 3 mLs (2.5 mg total) by nebulization every 6 (six) hours as needed for wheezing or shortness of breath. Patient not taking: Reported on  07/19/2016 03/07/15   Robbie Lis, MD  aspirin EC 81 MG tablet Take 1 tablet (81 mg total) by mouth daily. Patient not taking: Reported on 07/15/2016 04/08/13   Charlynne Cousins, MD  famotidine (PEPCID) 20 MG tablet Take 1 tablet (20 mg total) by mouth 2 (two) times daily. Patient not taking: Reported on 06/22/2016 03/07/15   Robbie Lis, MD  feeding supplement (BOOST / RESOURCE BREEZE) LIQD Take 1 Container by mouth 3 (three) times daily between meals. Patient not taking: Reported on 06/24/2016 03/16/15   Eugenie Filler, MD  rivaroxaban (XARELTO) 20 MG TABS tablet Take 1 tablet (20 mg total) by mouth daily with supper. Patient not taking: Reported on 07/07/2016 03/16/15   Eugenie Filler, MD    Family History Family History  Problem Relation Age of Onset  . Other Mother   . Other Father   . Hypertension  Social History Social History  Substance Use Topics  . Smoking status: Former Smoker    Packs/day: 0.50    Years: 40.00    Types: Cigarettes    Quit date: 07/01/2004  . Smokeless tobacco: Never Used  . Alcohol use No     Allergies   Effexor [venlafaxine hydrochloride]   Review of Systems Review of Systems  Constitutional: Negative for appetite change, chills and fever.  HENT: Negative for facial swelling and sore throat.   Respiratory: Positive for cough (Productive) and shortness of breath.   Cardiovascular: Negative for chest pain.  Gastrointestinal: Negative for abdominal pain, nausea and vomiting.  Genitourinary: Negative for dysuria.  Musculoskeletal: Negative for back pain.  Skin: Negative for rash and wound.  Neurological: Positive for weakness (Generalized). Negative for headaches.  Psychiatric/Behavioral: The patient is not nervous/anxious.      Physical Exam Updated Vital Signs BP 118/70 (BP Location: Left Arm)   Pulse 95   Resp 22   SpO2 95%   Physical Exam  Constitutional: She appears well-developed and well-nourished. No distress.    Morbidly obese.   HENT:  Head: Normocephalic and atraumatic.  Mouth/Throat: Oropharynx is clear and moist. No oropharyngeal exudate.  Eyes: Conjunctivae are normal. Pupils are equal, round, and reactive to light. Right eye exhibits no discharge. Left eye exhibits no discharge. No scleral icterus.  Neck: Normal range of motion. Neck supple. No thyromegaly present.  Cardiovascular: Normal rate, regular rhythm, normal heart sounds and intact distal pulses.  Exam reveals no gallop and no friction rub.   No murmur heard. Pulmonary/Chest: Effort normal. No stridor. No respiratory distress. She has decreased breath sounds. She has wheezes (Bilateral exipratory). She has rhonchi. She has rales.  Abdominal: Soft. Bowel sounds are normal. She exhibits no distension. There is tenderness (generalized). There is no rebound and no guarding.  Musculoskeletal: She exhibits no edema.  Lymphadenopathy:    She has no cervical adenopathy.  Neurological: She is alert. Coordination normal.  Skin: Skin is warm and dry. No rash noted. She is not diaphoretic. No pallor.  Macerated skin breakdown to bilateral flexural surfaces.   Psychiatric: She has a normal mood and affect.  Nursing note and vitals reviewed.    ED Treatments / Results  DIAGNOSTIC STUDIES: Oxygen Saturation is 95% on RA, normal by my interpretation.    COORDINATION OF CARE: 9:13 PM- Pt advised of plan for treatment and pt agrees.  Labs (all labs ordered are listed, but only abnormal results are displayed) Labs Reviewed  CBC WITH DIFFERENTIAL/PLATELET - Abnormal; Notable for the following:       Result Value   WBC 13.9 (*)    RBC 3.17 (*)    Hemoglobin 10.1 (*)    HCT 30.9 (*)    RDW 16.9 (*)    Neutro Abs 11.7 (*)    All other components within normal limits  COMPREHENSIVE METABOLIC PANEL - Abnormal; Notable for the following:    Potassium 6.4 (*)    CO2 13 (*)    BUN 190 (*)    Creatinine, Ser 19.66 (*)    Calcium 7.1 (*)     Albumin 1.9 (*)    ALT 5 (*)    GFR calc non Af Amer 2 (*)    GFR calc Af Amer 2 (*)    Anion gap 17 (*)    All other components within normal limits  LIPASE, BLOOD - Abnormal; Notable for the following:    Lipase 671 (*)  All other components within normal limits  BRAIN NATRIURETIC PEPTIDE - Abnormal; Notable for the following:    B Natriuretic Peptide 905.6 (*)    All other components within normal limits  URINALYSIS, ROUTINE W REFLEX MICROSCOPIC - Abnormal; Notable for the following:    APPearance TURBID (*)    Hgb urine dipstick LARGE (*)    Protein, ur 100 (*)    Leukocytes, UA MODERATE (*)    Bacteria, UA MANY (*)    All other components within normal limits  HEPARIN LEVEL (UNFRACTIONATED) - Abnormal; Notable for the following:    Heparin Unfractionated 1.36 (*)    All other components within normal limits  APTT - Abnormal; Notable for the following:    aPTT 45 (*)    All other components within normal limits  PROTIME-INR - Abnormal; Notable for the following:    Prothrombin Time 25.4 (*)    All other components within normal limits  CBG MONITORING, ED - Abnormal; Notable for the following:    Glucose-Capillary 43 (*)    All other components within normal limits  CBG MONITORING, ED - Abnormal; Notable for the following:    Glucose-Capillary 40 (*)    All other components within normal limits  CBG MONITORING, ED - Abnormal; Notable for the following:    Glucose-Capillary 63 (*)    All other components within normal limits  CBG MONITORING, ED - Abnormal; Notable for the following:    Glucose-Capillary 63 (*)    All other components within normal limits  CBG MONITORING, ED - Abnormal; Notable for the following:    Glucose-Capillary 166 (*)    All other components within normal limits  CBG MONITORING, ED - Abnormal; Notable for the following:    Glucose-Capillary 117 (*)    All other components within normal limits  CULTURE, BLOOD (ROUTINE X 2)  CULTURE, BLOOD  (ROUTINE X 2)  URINE CULTURE  CULTURE, EXPECTORATED SPUTUM-ASSESSMENT  GRAM STAIN  TROPONIN I  TROPONIN I  HEMOGLOBIN A1C  TSH  CORTISOL-AM, BLOOD  CREATININE, URINE, RANDOM  UREA NITROGEN, URINE  LIPID PANEL  TROPONIN I  STREP PNEUMONIAE URINARY ANTIGEN  LEGIONELLA PNEUMOPHILA SEROGP 1 UR AG  I-STAT CG4 LACTIC ACID, ED  CBG MONITORING, ED  CBG MONITORING, ED  CBG MONITORING, ED    EKG  EKG Interpretation None       Radiology US Renal  Result Date: 07/17/2016 CLINICAL DATA:  66 year old female with acute kidney insufficiency and elevated BUN and creatinine. EXAM: RENAL / URINARY TRACT ULTRASOUND COMPLETE COMPARISON:  None. FINDINGS: Evaluation is limited due to patient's body habitus. Right Kidney: Length: 10 cm. The kidney is atrophic and echogenic. There is poor visualization of the kidney. No definite echogenic stone or hydronephrosis. Left Kidney: Length: 13 cm.  The left kidney is poorly visualized. Bladder: Not well seen. IMPRESSION: Very limited study due to patient's body habitus. Atrophic and echogenic appearance of the right kidney. Nonvisualization of the left kidney and urinary bladder. Electronically Signed   By: Anner Crete M.D.   On: 07/17/2016 03:41   Dg Chest Portable 1 View  Result Date: 07/22/2016 CLINICAL DATA:  Initial evaluation for acute cough, shortness of breath, fatigue. EXAM: PORTABLE CHEST 1 VIEW COMPARISON:  Prior radiograph from 03/09/2015. FINDINGS: Moderate cardiomegaly, stable from previous. Mediastinal silhouette within normal limits. Lungs mildly hypoinflated. Diffuse vascular congestion with interstitial prominence, compatible with pulmonary edema. Associated layering left pleural effusion. More confluent left basilar and left perihilar opacities may reflect edema/  atelectasis, although superimposed infiltrates not excluded. No pneumothorax. No acute osseus abnormality. Surgical clips overlie the right axilla. IMPRESSION: 1. Cardiomegaly  with mild diffuse pulmonary edema and small left pleural effusion. 2. Slightly more confluent left perihilar and left basilar opacities, favored to reflect edema/atelectasis, although superimposed infiltrate could be considered in the correct clinical setting. Electronically Signed   By: Jeannine Boga M.D.   On: 07/12/2016 22:47    Procedures Procedures (including critical care time)  Medications Ordered in ED Medications  dextrose 10 % infusion ( Intravenous New Bag/Given 07/17/16 0347)  dextrose 50 % solution 50 mL (not administered)  aspirin EC tablet 81 mg (not administered)  ALPRAZolam (XANAX) tablet 1 mg (not administered)  HYDROcodone-acetaminophen (NORCO/VICODIN) 5-325 MG per tablet 1 tablet (not administered)  pantoprazole (PROTONIX) EC tablet 20 mg (not administered)  tiZANidine (ZANAFLEX) tablet 2 mg (not administered)  feeding supplement (PRO-STAT SUGAR FREE 64) liquid 30 mL (not administered)  furosemide (LASIX) tablet 80 mg (not administered)  nebivolol (BYSTOLIC) tablet 2.5 mg (not administered)  albuterol (PROVENTIL) (2.5 MG/3ML) 0.083% nebulizer solution 2.5 mg (not administered)  loperamide (IMODIUM) capsule 2 mg (not administered)  saccharomyces boulardii (FLORASTOR) capsule 250 mg (not administered)  LORazepam (ATIVAN) tablet 0.25 mg (not administered)  diphenhydrAMINE (BENADRYL) capsule 25 mg (not administered)  gabapentin (NEURONTIN) capsule 300 mg (not administered)  acetaminophen (TYLENOL) tablet 650 mg (not administered)  zolpidem (AMBIEN) tablet 5 mg (not administered)  nystatin (MYCOSTATIN/NYSTOP) topical powder (not administered)  sodium bicarbonate 1 mEq/mL injection (not administered)  heparin ADULT infusion 100 units/mL (25000 units/224mL sodium chloride 0.45%) (not administered)  cefTRIAXone (ROCEPHIN) 1 g in dextrose 5 % 50 mL IVPB (not administered)  morphine 2 MG/ML injection 2 mg (not administered)  nitroGLYCERIN (NITROSTAT) SL tablet 0.4 mg  (not administered)  doxycycline (VIBRAMYCIN) 200 mg in dextrose 5 % 250 mL IVPB (not administered)  doxycycline (VIBRAMYCIN) 100 mg in dextrose 5 % 250 mL IVPB (not administered)  dextrose 50 % solution 50 mL (50 mLs Intravenous Given 06/30/2016 2244)  cefTRIAXone (ROCEPHIN) 1 g in dextrose 5 % 50 mL IVPB (1 g Intravenous New Bag/Given 07/17/16 0511)  calcium gluconate 2 g in sodium chloride 0.9 % 100 mL IVPB (0 g Intravenous Stopped 07/17/16 0424)  sodium polystyrene (KAYEXALATE) 15 GM/60ML suspension 45 g (45 g Oral Given 07/17/16 0358)  sodium bicarbonate injection 50 mEq (50 mEq Intravenous Given 07/17/16 0418)  furosemide (LASIX) 160 mg in dextrose 5 % 50 mL IVPB (160 mg Intravenous New Bag/Given 07/17/16 0423)  iopamidol (ISOVUE-300) 61 % injection 30 mL (30 mLs Oral Contrast Given 07/17/16 0523)  dextrose 50 % solution 50 mL (50 mLs Intravenous Given 07/17/16 0407)  insulin aspart (novoLOG) injection 5 Units (5 Units Intravenous Given 07/17/16 0415)     Initial Impression / Assessment and Plan / ED Course  I have reviewed the triage vital signs and the nursing notes.  Pertinent labs & imaging results that were available during my care of the patient were reviewed by me and considered in my medical decision making (see chart for details).     Patient with probable pneumonia as well as AKI. Patient also with elevated lipase. CBC shows WBC 13.9, hemoglobin 10.1. CMP shows potassium 6.4, CO2 13, BUN 190, creatinine 19.66, calcium 7.1, albumin 1.9, anion gap 17. Lipase 671. Lactate 1.75. BNP 905.6. UA shows large hematuria, moderate leukocytes, many bacteria, too numerous to count WBCs. Urine culture sent. CXR shows cardiomegaly with mild diffuse pulmonary edema and  small left pleural effusion; slightly more confluent left perihilar and left basilar opacities, favor to reflect edema/atelectasis, although superimposed infiltrate could be considered in the correct clinical setting. Initial CBG 40.  Hyperglycemia difficulty controlling ED. Patient given ampules of D50 and ultimately initiated on D5 half infusion. Azithromycin and Rocephin initiated in the ED for pneumonia. I consulted Dr. Blaine Hamper, with [facility], who will admit the patient the patient to stepdown unit for further evaluation and treatment. Patient also evaluated by Dr. Ellender Hose who guided the patient's management and agreed to plan.  Final Clinical Impressions(s) / ED Diagnoses   Final diagnoses:  AKI (acute kidney injury) ([place])  Abdominal pain  Generalized weakness    New Prescriptions New Prescriptions   No medications on file   I personally performed the services described in this documentation, which was scribed in my presence. The recorded information has been reviewed and is accurate.     Frederica Kuster, PA-C 07/17/16 [id number]    Duffy Bruce, MD 07/18/16 1044

## 2016-07-16 NOTE — ED Notes (Signed)
Bed: WA08 Expected date:  Expected time:  Means of arrival:  Comments: Hold for hall A 

## 2016-07-16 NOTE — ED Notes (Signed)
Iv in rt chest infiltrated. Attempting to start another iv to administer remaining d50

## 2016-07-17 ENCOUNTER — Inpatient Hospital Stay (HOSPITAL_COMMUNITY): Payer: Medicare Other

## 2016-07-17 DIAGNOSIS — R579 Shock, unspecified: Secondary | ICD-10-CM | POA: Diagnosis not present

## 2016-07-17 DIAGNOSIS — K859 Acute pancreatitis without necrosis or infection, unspecified: Secondary | ICD-10-CM | POA: Diagnosis present

## 2016-07-17 DIAGNOSIS — G9340 Encephalopathy, unspecified: Secondary | ICD-10-CM | POA: Diagnosis not present

## 2016-07-17 DIAGNOSIS — E875 Hyperkalemia: Secondary | ICD-10-CM | POA: Diagnosis present

## 2016-07-17 DIAGNOSIS — R0782 Intercostal pain: Secondary | ICD-10-CM | POA: Diagnosis not present

## 2016-07-17 DIAGNOSIS — I480 Paroxysmal atrial fibrillation: Secondary | ICD-10-CM | POA: Diagnosis present

## 2016-07-17 DIAGNOSIS — R079 Chest pain, unspecified: Secondary | ICD-10-CM | POA: Diagnosis present

## 2016-07-17 DIAGNOSIS — J9601 Acute respiratory failure with hypoxia: Secondary | ICD-10-CM | POA: Diagnosis not present

## 2016-07-17 DIAGNOSIS — N179 Acute kidney failure, unspecified: Secondary | ICD-10-CM | POA: Diagnosis not present

## 2016-07-17 DIAGNOSIS — N170 Acute kidney failure with tubular necrosis: Secondary | ICD-10-CM | POA: Diagnosis present

## 2016-07-17 DIAGNOSIS — I5033 Acute on chronic diastolic (congestive) heart failure: Secondary | ICD-10-CM | POA: Diagnosis present

## 2016-07-17 DIAGNOSIS — E872 Acidosis: Secondary | ICD-10-CM | POA: Diagnosis present

## 2016-07-17 DIAGNOSIS — J189 Pneumonia, unspecified organism: Secondary | ICD-10-CM | POA: Diagnosis present

## 2016-07-17 DIAGNOSIS — Z6841 Body Mass Index (BMI) 40.0 and over, adult: Secondary | ICD-10-CM | POA: Diagnosis not present

## 2016-07-17 DIAGNOSIS — R109 Unspecified abdominal pain: Secondary | ICD-10-CM | POA: Diagnosis not present

## 2016-07-17 DIAGNOSIS — A419 Sepsis, unspecified organism: Secondary | ICD-10-CM | POA: Diagnosis not present

## 2016-07-17 DIAGNOSIS — I110 Hypertensive heart disease with heart failure: Secondary | ICD-10-CM | POA: Diagnosis present

## 2016-07-17 DIAGNOSIS — F418 Other specified anxiety disorders: Secondary | ICD-10-CM | POA: Diagnosis present

## 2016-07-17 DIAGNOSIS — Y95 Nosocomial condition: Secondary | ICD-10-CM | POA: Diagnosis present

## 2016-07-17 DIAGNOSIS — I959 Hypotension, unspecified: Secondary | ICD-10-CM | POA: Diagnosis not present

## 2016-07-17 DIAGNOSIS — J9621 Acute and chronic respiratory failure with hypoxia: Secondary | ICD-10-CM | POA: Diagnosis present

## 2016-07-17 DIAGNOSIS — R1013 Epigastric pain: Secondary | ICD-10-CM | POA: Diagnosis not present

## 2016-07-17 DIAGNOSIS — N390 Urinary tract infection, site not specified: Secondary | ICD-10-CM | POA: Diagnosis present

## 2016-07-17 DIAGNOSIS — E871 Hypo-osmolality and hyponatremia: Secondary | ICD-10-CM | POA: Diagnosis present

## 2016-07-17 DIAGNOSIS — E162 Hypoglycemia, unspecified: Secondary | ICD-10-CM | POA: Diagnosis present

## 2016-07-17 DIAGNOSIS — R531 Weakness: Secondary | ICD-10-CM | POA: Diagnosis present

## 2016-07-17 DIAGNOSIS — Z7401 Bed confinement status: Secondary | ICD-10-CM | POA: Diagnosis not present

## 2016-07-17 DIAGNOSIS — I4581 Long QT syndrome: Secondary | ICD-10-CM | POA: Diagnosis present

## 2016-07-17 DIAGNOSIS — T68XXXA Hypothermia, initial encounter: Secondary | ICD-10-CM | POA: Diagnosis not present

## 2016-07-17 DIAGNOSIS — B379 Candidiasis, unspecified: Secondary | ICD-10-CM | POA: Diagnosis present

## 2016-07-17 DIAGNOSIS — J9622 Acute and chronic respiratory failure with hypercapnia: Secondary | ICD-10-CM | POA: Diagnosis present

## 2016-07-17 LAB — COMPREHENSIVE METABOLIC PANEL
ALT: 5 U/L — AB (ref 14–54)
AST: 15 U/L (ref 15–41)
Albumin: 1.9 g/dL — ABNORMAL LOW (ref 3.5–5.0)
Alkaline Phosphatase: 100 U/L (ref 38–126)
Anion gap: 17 — ABNORMAL HIGH (ref 5–15)
BILIRUBIN TOTAL: 0.9 mg/dL (ref 0.3–1.2)
BUN: 190 mg/dL — ABNORMAL HIGH (ref 6–20)
CALCIUM: 7.1 mg/dL — AB (ref 8.9–10.3)
CO2: 13 mmol/L — AB (ref 22–32)
CREATININE: 19.66 mg/dL — AB (ref 0.44–1.00)
Chloride: 105 mmol/L (ref 101–111)
GFR calc non Af Amer: 2 mL/min — ABNORMAL LOW (ref >60)
GFR, EST AFRICAN AMERICAN: 2 mL/min — AB (ref >60)
GLUCOSE: 67 mg/dL (ref 65–99)
Potassium: 6.4 mmol/L (ref 3.5–5.1)
SODIUM: 135 mmol/L (ref 135–145)
TOTAL PROTEIN: 7.1 g/dL (ref 6.5–8.1)

## 2016-07-17 LAB — RENAL FUNCTION PANEL
Albumin: 1.9 g/dL — ABNORMAL LOW (ref 3.5–5.0)
Anion gap: 21 — ABNORMAL HIGH (ref 5–15)
BUN: 190 mg/dL — AB (ref 6–20)
CALCIUM: 6.9 mg/dL — AB (ref 8.9–10.3)
CHLORIDE: 99 mmol/L — AB (ref 101–111)
CO2: 14 mmol/L — AB (ref 22–32)
CREATININE: 18.29 mg/dL — AB (ref 0.44–1.00)
GFR calc non Af Amer: 2 mL/min — ABNORMAL LOW (ref >60)
GFR, EST AFRICAN AMERICAN: 2 mL/min — AB (ref >60)
Glucose, Bld: 77 mg/dL (ref 65–99)
Phosphorus: 8.3 mg/dL — ABNORMAL HIGH (ref 2.5–4.6)
Potassium: 6 mmol/L — ABNORMAL HIGH (ref 3.5–5.1)
SODIUM: 134 mmol/L — AB (ref 135–145)

## 2016-07-17 LAB — ECHOCARDIOGRAM COMPLETE
HEIGHTINCHES: 63 in
Weight: 5504 oz

## 2016-07-17 LAB — CBG MONITORING, ED
GLUCOSE-CAPILLARY: 63 mg/dL — AB (ref 65–99)
GLUCOSE-CAPILLARY: 74 mg/dL (ref 65–99)
GLUCOSE-CAPILLARY: 79 mg/dL (ref 65–99)
Glucose-Capillary: 166 mg/dL — ABNORMAL HIGH (ref 65–99)
Glucose-Capillary: 71 mg/dL (ref 65–99)
Glucose-Capillary: 117 mg/dL — ABNORMAL HIGH (ref 65–99)
Glucose-Capillary: 71 mg/dL (ref 65–99)
Glucose-Capillary: 80 mg/dL (ref 65–99)
Glucose-Capillary: 70 mg/dL (ref 65–99)

## 2016-07-17 LAB — PROTIME-INR
INR: 2.27
PROTHROMBIN TIME: 25.4 s — AB (ref 11.4–15.2)

## 2016-07-17 LAB — LIPASE, BLOOD: Lipase: 671 U/L — ABNORMAL HIGH (ref 11–51)

## 2016-07-17 LAB — LIPID PANEL
CHOL/HDL RATIO: 3.8 ratio
CHOLESTEROL: 64 mg/dL (ref 0–200)
HDL: 17 mg/dL — ABNORMAL LOW (ref >40)
LDL Cholesterol: 16 mg/dL (ref 0–99)
Triglycerides: 155 mg/dL — ABNORMAL HIGH (ref <150)
VLDL: 31 mg/dL (ref 0–40)

## 2016-07-17 LAB — GLUCOSE, CAPILLARY
GLUCOSE-CAPILLARY: 85 mg/dL (ref 65–99)
Glucose-Capillary: 99 mg/dL (ref 65–99)

## 2016-07-17 LAB — STREP PNEUMONIAE URINARY ANTIGEN: Strep Pneumo Urinary Antigen: NEGATIVE

## 2016-07-17 LAB — URINALYSIS, ROUTINE W REFLEX MICROSCOPIC
Bilirubin Urine: NEGATIVE
Glucose, UA: NEGATIVE mg/dL
Ketones, ur: NEGATIVE mg/dL
Nitrite: NEGATIVE
PH: 6 (ref 5.0–8.0)
Protein, ur: 100 mg/dL — AB
SPECIFIC GRAVITY, URINE: 1.014 (ref 1.005–1.030)
SQUAMOUS EPITHELIAL / LPF: NONE SEEN

## 2016-07-17 LAB — APTT: APTT: 45 s — AB (ref 24–36)

## 2016-07-17 LAB — TROPONIN I: Troponin I: <0.03 ng/mL (ref <0.03)

## 2016-07-17 LAB — TSH: TSH: 3.524 u[IU]/mL (ref 0.350–4.500)

## 2016-07-17 LAB — BRAIN NATRIURETIC PEPTIDE: B NATRIURETIC PEPTIDE 5: 905.6 pg/mL — AB (ref 0.0–100.0)

## 2016-07-17 LAB — HEPARIN LEVEL (UNFRACTIONATED): Heparin Unfractionated: 1.36 IU/mL — ABNORMAL HIGH (ref 0.30–0.70)

## 2016-07-17 LAB — MRSA PCR SCREENING: MRSA BY PCR: NEGATIVE

## 2016-07-17 LAB — CREATININE, URINE, RANDOM: Creatinine, Urine: 48.6 mg/dL

## 2016-07-17 LAB — I-STAT CG4 LACTIC ACID, ED: Lactic Acid, Venous: 1.75 mmol/L (ref 0.5–1.9)

## 2016-07-17 MED ORDER — DEXTROSE 10 % IV SOLN
INTRAVENOUS | Status: DC
  Administered 2016-07-17 – 2016-07-18 (×2): via INTRAVENOUS
  Filled 2016-07-17: qty 1000

## 2016-07-17 MED ORDER — ONDANSETRON HCL 4 MG/2ML IJ SOLN
4.0000 mg | Freq: Three times a day (TID) | INTRAMUSCULAR | Status: DC | PRN
Start: 2016-07-17 — End: 2016-07-17

## 2016-07-17 MED ORDER — DEXTROSE-NACL 5-0.45 % IV SOLN
INTRAVENOUS | Status: DC
  Administered 2016-07-17: 01:00:00 via INTRAVENOUS

## 2016-07-17 MED ORDER — IOPAMIDOL (ISOVUE-300) INJECTION 61%
30.0000 mL | Freq: Once | INTRAVENOUS | Status: AC | PRN
  Administered 2016-07-17: 30 mL via ORAL

## 2016-07-17 MED ORDER — NEBIVOLOL HCL 2.5 MG PO TABS
2.5000 mg | ORAL_TABLET | Freq: Every day | ORAL | Status: DC
  Administered 2016-07-17 – 2016-07-18 (×2): 2.5 mg via ORAL
  Filled 2016-07-17 (×5): qty 1

## 2016-07-17 MED ORDER — PRO-STAT SUGAR FREE PO LIQD
30.0000 mL | Freq: Two times a day (BID) | ORAL | Status: DC
  Administered 2016-07-17 – 2016-07-23 (×9): 30 mL via ORAL
  Filled 2016-07-17 (×9): qty 30

## 2016-07-17 MED ORDER — SODIUM POLYSTYRENE SULFONATE 15 GM/60ML PO SUSP
45.0000 g | Freq: Once | ORAL | Status: AC
  Administered 2016-07-17: 45 g via ORAL
  Filled 2016-07-17: qty 180

## 2016-07-17 MED ORDER — PANTOPRAZOLE SODIUM 20 MG PO TBEC
20.0000 mg | DELAYED_RELEASE_TABLET | Freq: Every day | ORAL | Status: DC
  Administered 2016-07-17 – 2016-07-19 (×3): 20 mg via ORAL
  Filled 2016-07-17 (×5): qty 1

## 2016-07-17 MED ORDER — DEXTROSE 5 % IV SOLN
160.0000 mg | Freq: Once | INTRAVENOUS | Status: AC
  Administered 2016-07-17: 160 mg via INTRAVENOUS
  Filled 2016-07-17: qty 10

## 2016-07-17 MED ORDER — FLUCONAZOLE 100 MG PO TABS
50.0000 mg | ORAL_TABLET | ORAL | Status: DC
  Administered 2016-07-17 – 2016-07-20 (×3): 50 mg via ORAL
  Filled 2016-07-17 (×5): qty 1

## 2016-07-17 MED ORDER — NITROGLYCERIN 0.4 MG SL SUBL: 0.4000 mg | SUBLINGUAL_TABLET | SUBLINGUAL | Status: DC | PRN

## 2016-07-17 MED ORDER — GABAPENTIN 300 MG PO CAPS
300.0000 mg | ORAL_CAPSULE | Freq: Every day | ORAL | Status: DC
  Administered 2016-07-17 – 2016-07-20 (×5): 300 mg via ORAL
  Filled 2016-07-17 (×5): qty 1

## 2016-07-17 MED ORDER — INSULIN REGULAR BOLUS VIA INFUSION: 5.0000 [IU] | Freq: Once | INTRAVENOUS | Status: DC

## 2016-07-17 MED ORDER — DEXTROSE 50 % IV SOLN
1.0000 | INTRAVENOUS | Status: DC | PRN
  Administered 2016-07-20 – 2016-07-22 (×4): 50 mL via INTRAVENOUS
  Filled 2016-07-17 (×5): qty 50

## 2016-07-17 MED ORDER — HYDROCODONE-ACETAMINOPHEN 5-325 MG PO TABS: 1.0000 | ORAL_TABLET | Freq: Four times a day (QID) | ORAL | Status: DC | PRN

## 2016-07-17 MED ORDER — DOXYCYCLINE HYCLATE 100 MG IV SOLR
100.0000 mg | Freq: Two times a day (BID) | INTRAVENOUS | Status: DC
  Administered 2016-07-17 – 2016-07-18 (×2): 100 mg via INTRAVENOUS
  Filled 2016-07-17 (×5): qty 100

## 2016-07-17 MED ORDER — ASPIRIN EC 81 MG PO TBEC
81.0000 mg | DELAYED_RELEASE_TABLET | Freq: Every day | ORAL | Status: DC
  Administered 2016-07-17 – 2016-07-24 (×6): 81 mg via ORAL
  Filled 2016-07-17 (×8): qty 1

## 2016-07-17 MED ORDER — INSULIN ASPART 100 UNIT/ML ~~LOC~~ SOLN
5.0000 [IU] | Freq: Once | SUBCUTANEOUS | Status: AC
  Administered 2016-07-17: 5 [IU] via INTRAVENOUS
  Filled 2016-07-17: qty 0.05
  Filled 2016-07-17: qty 1
  Filled 2016-07-17: qty 0.05

## 2016-07-17 MED ORDER — SACCHAROMYCES BOULARDII 250 MG PO CAPS
250.0000 mg | ORAL_CAPSULE | Freq: Two times a day (BID) | ORAL | Status: DC
  Administered 2016-07-17 – 2016-07-20 (×7): 250 mg via ORAL
  Filled 2016-07-17 (×9): qty 1

## 2016-07-17 MED ORDER — HEPARIN (PORCINE) IN NACL 100-0.45 UNIT/ML-% IJ SOLN
1400.0000 [IU]/h | INTRAMUSCULAR | Status: DC
  Administered 2016-07-17: 1400 [IU]/h via INTRAVENOUS
  Filled 2016-07-17 (×2): qty 250

## 2016-07-17 MED ORDER — DEXTROSE 5 % IV SOLN
1.0000 g | INTRAVENOUS | Status: AC
  Administered 2016-07-17 – 2016-07-23 (×7): 1 g via INTRAVENOUS
  Filled 2016-07-17 (×8): qty 10

## 2016-07-17 MED ORDER — ALBUTEROL SULFATE (2.5 MG/3ML) 0.083% IN NEBU: 2.5000 mg | INHALATION_SOLUTION | RESPIRATORY_TRACT | Status: DC | PRN

## 2016-07-17 MED ORDER — DOXYCYCLINE HYCLATE 100 MG IV SOLR
200.0000 mg | Freq: Once | INTRAVENOUS | Status: AC
  Administered 2016-07-17: 200 mg via INTRAVENOUS
  Filled 2016-07-17: qty 200

## 2016-07-17 MED ORDER — TRAZODONE HCL 100 MG PO TABS: 100.0000 mg | ORAL_TABLET | Freq: Every day | ORAL | Status: DC

## 2016-07-17 MED ORDER — DEXTROSE 5 % IV SOLN
1.0000 g | Freq: Once | INTRAVENOUS | Status: AC
  Administered 2016-07-17: 1 g via INTRAVENOUS
  Filled 2016-07-17: qty 10

## 2016-07-17 MED ORDER — DEXTROSE 5 % IV SOLN: 500.0000 mg | INTRAVENOUS | Status: DC

## 2016-07-17 MED ORDER — ALPRAZOLAM 0.5 MG PO TABS
1.0000 mg | ORAL_TABLET | Freq: Two times a day (BID) | ORAL | Status: DC
  Administered 2016-07-17 – 2016-07-20 (×7): 1 mg via ORAL
  Filled 2016-07-17 (×6): qty 2
  Filled 2016-07-17: qty 1
  Filled 2016-07-17 (×2): qty 2

## 2016-07-17 MED ORDER — DIPHENHYDRAMINE HCL 25 MG PO CAPS: 25.0000 mg | ORAL_CAPSULE | Freq: Four times a day (QID) | ORAL | Status: DC | PRN

## 2016-07-17 MED ORDER — NYSTATIN 100000 UNIT/GM EX CREA: TOPICAL_CREAM | Freq: Two times a day (BID) | CUTANEOUS | Status: DC

## 2016-07-17 MED ORDER — DEXTROSE 50 % IV SOLN
50.0000 mL | Freq: Once | INTRAVENOUS | Status: AC
  Administered 2016-07-17: 50 mL via INTRAVENOUS

## 2016-07-17 MED ORDER — ZOLPIDEM TARTRATE 5 MG PO TABS
5.0000 mg | ORAL_TABLET | Freq: Every evening | ORAL | Status: DC | PRN
Start: 2016-07-17 — End: 2016-07-18

## 2016-07-17 MED ORDER — PERFLUTREN LIPID MICROSPHERE
1.0000 mL | INTRAVENOUS | Status: AC | PRN
  Administered 2016-07-17: 3 mL via INTRAVENOUS
  Filled 2016-07-17: qty 10

## 2016-07-17 MED ORDER — APIXABAN 5 MG PO TABS: 5.0000 mg | ORAL_TABLET | Freq: Two times a day (BID) | ORAL | Status: DC

## 2016-07-17 MED ORDER — IOPAMIDOL (ISOVUE-300) INJECTION 61%
INTRAVENOUS | Status: AC
  Administered 2016-07-17: 30 mL via ORAL
  Filled 2016-07-17: qty 30

## 2016-07-17 MED ORDER — SODIUM BICARBONATE 8.4 % IV SOLN
50.0000 meq | Freq: Once | INTRAVENOUS | Status: AC
  Administered 2016-07-17: 50 meq via INTRAVENOUS
  Filled 2016-07-17: qty 50

## 2016-07-17 MED ORDER — SERTRALINE HCL 50 MG PO TABS: 50.0000 mg | ORAL_TABLET | Freq: Every day | ORAL | Status: DC

## 2016-07-17 MED ORDER — SODIUM BICARBONATE 8.4 % IV SOLN
INTRAVENOUS | Status: AC
  Filled 2016-07-17: qty 50

## 2016-07-17 MED ORDER — SODIUM CHLORIDE 0.9 % IV SOLN
2.0000 g | Freq: Once | INTRAVENOUS | Status: AC
  Administered 2016-07-17: 2 g via INTRAVENOUS
  Filled 2016-07-17: qty 20

## 2016-07-17 MED ORDER — LOPERAMIDE HCL 2 MG PO CAPS: 2.0000 mg | ORAL_CAPSULE | Freq: Four times a day (QID) | ORAL | Status: DC | PRN

## 2016-07-17 MED ORDER — LIDOCAINE HCL 1 % IJ SOLN
INTRAMUSCULAR | Status: AC
  Filled 2016-07-17: qty 20

## 2016-07-17 MED ORDER — TIZANIDINE HCL 2 MG PO TABS
2.0000 mg | ORAL_TABLET | Freq: Three times a day (TID) | ORAL | Status: DC | PRN
  Filled 2016-07-17: qty 1

## 2016-07-17 MED ORDER — FUROSEMIDE 40 MG PO TABS: 80.0000 mg | ORAL_TABLET | Freq: Two times a day (BID) | ORAL | Status: DC

## 2016-07-17 MED ORDER — LORAZEPAM 0.5 MG PO TABS: 0.2500 mg | ORAL_TABLET | Freq: Two times a day (BID) | ORAL | Status: DC | PRN

## 2016-07-17 MED ORDER — DEXTROSE 5 % IV SOLN: 500.0000 mg | Freq: Once | INTRAVENOUS | Status: DC

## 2016-07-17 MED ORDER — ACETAMINOPHEN 325 MG PO TABS: 650.0000 mg | ORAL_TABLET | ORAL | Status: DC | PRN

## 2016-07-17 MED ORDER — NYSTATIN 100000 UNIT/GM EX POWD
Freq: Two times a day (BID) | CUTANEOUS | Status: DC
  Administered 2016-07-17 (×2): via TOPICAL
  Administered 2016-07-19: 1 via TOPICAL
  Administered 2016-07-19 – 2016-07-24 (×12): via TOPICAL
  Filled 2016-07-17 (×4): qty 15

## 2016-07-17 MED ORDER — PERFLUTREN LIPID MICROSPHERE
INTRAVENOUS | Status: AC
Start: 2016-07-17 — End: 2016-07-17
  Filled 2016-07-17: qty 10

## 2016-07-17 MED ORDER — MORPHINE SULFATE (PF) 2 MG/ML IV SOLN: 2.0000 mg | INTRAVENOUS | Status: DC | PRN

## 2016-07-17 NOTE — ED Notes (Signed)
Patient vomited after drinking about 25 ml of kayexalate. Has 15 ml left to drink

## 2016-07-17 NOTE — ED Notes (Signed)
I attempted to collect labs and did not see or feel any place to collect labs from.  I called main lab they said they will be down here shortly

## 2016-07-17 NOTE — Progress Notes (Signed)
  Echocardiogram 2D Echocardiogram has been performed with definity.  Aggie Cosier 07/17/2016, 2:11 PM

## 2016-07-17 NOTE — Progress Notes (Signed)
   07/06/2016 0000  CM Assessment  Expected Discharge [place]  In-house Referral NA  Discharge Planning Services CM Consult  [facility]  Choice offered to / list presented to  Patient  DME Arranged N/A  DME Agency NA  [place] Arranged RN  [place]  Status of Service In process, will continue to follow  Discharge [place]   ED CM consulted [facility] after noting CM consult stating pt is active with [facility] services CM spoke with Judson Roch [facility] to discuss pt admission and confirmed pt active with [facility] at 1053 at [phone] [facility] needs to be contacted close to pt d/c

## 2016-07-17 NOTE — H&P (Addendum)
History and Physical    [name] [id number] DOB: [birth year] DOA: 06/22/2016  Referring MD/NP/PA:   PCP: Alvester Chou, NP   Patient coming from:  The patient is coming from home.  At baseline, pt is dependent for most of ADL. Has home health nurse visiting.  Chief Complaint: Generalized weakness, chest pain, shortness of breath, abdominal pain,  HPI: [name] is a 66 y.o. female with medical history significant of morbid obesity, bed bound, DVT on Eliquis, hypertension, GERD, depression, anxiety, atrial fibrillation on Eliquis, OSA on CPAP, prostate cancer 1988, dCHF, who presents with generalized weakness, chest pain, shortness breath, abdominal pain.  Patient states that she has a generalized weakness and decreased oral intake for about 1 week. She was found to have CBG 55. She also has chest pain, shortness of breath and dry cough. Denies fever or chills. Her chest pain is located in the frontal chest, constant, 8/10 in severity, pleuritic, aggravated with deep breath. No tenderness over calf areas. She states that she has a new abdominal pain, which is diffuse, moderate, nonradiating. It is associated with nausea, but not vomiting. She states that she has chronic diarrhea which is at baseline. She has been taking Imodium with some help. She denies any symptoms of UTI. She was found to have yeast infection and skin breakdown under skinfold of arms and breasts. Pt states that she did not take her Eliquis in the past 6 days. Pt states that she did not urinate for 2 days.  ED Course: pt was found to have WBC 16.9, history of present illness with creatinine 19.66, BUN 190, potassium 6.4, bicarbonate 13, BNP 905,  lactate 1.75, INR 2.27, lipase 6070, positive urinalysis with small amount of leukocytes, temperature normal, oxygen saturation 95% on room air, heart rate is 90, respiration rate 22. Pt is admitted to SDU as inpt. Nephrology, Dr. Mercy Moore was consulted.  # CXR showed: 1.  Cardiomegaly with mild diffuse pulmonary edema and small left pleural effusion. 2. Slightly more confluent left perihilar and left basilar opacities, favored to reflect edema/atelectasis, although superimposed infiltrate could be considered in the correct clinical setting.  Review of Systems:   General: no fevers, chills, has poor appetite, has fatigue HEENT: no blurry vision, hearing changes or sore throat Respiratory: has dyspnea, coughing, no wheezing CV: has chest pain, no palpitations GI: has nausea, abdominal pain, diarrhea, no constipation, vomiting, GU: no dysuria, burning on urination, increased urinary frequency, hematuria  Ext: has leg edema Neuro: no unilateral weakness, numbness, or tingling, no vision change or hearing loss Skin: has fungal infection of skin fold. MSK: No muscle spasm, no deformity, no limitation of range of movement in spin Heme: No easy bruising.  Travel history: No recent long distant travel.  Allergy:  Allergies  Allergen Reactions  . Effexor [Venlafaxine Hydrochloride] Anxiety    Made her want to kill herself    Past Medical History:  Diagnosis Date  . Anxiety   . Arthritis    "knees; fingers/hands" (04/07/2013)  . Breast cancer ([place]) 1988  . Calcification of right breast    chronic s/p WLE/XRT   . Cellulitis    right breast, right arm, ble  . Chronic bronchitis ([place])    "maybe 4 years in a row; last time was ~1990" (04/07/2013)  . Depression   . Dysrhythmia   . Exertional shortness of breath   . Hypertension   . Lymphedema of arm 2008, 2012   s/p Axillary LN dissection 1988 w Lumpectomy  .  Obesities, morbid ([place])   . Obstructive sleep apnea on CPAP 06/04/2012  . PAF (paroxysmal atrial fibrillation) ([place]) 06/04/2012  . Pneumonia    "several times; last time was in the 1990's" (04/07/2013)  . Right leg DVT ([place]) 04/06/2013   "behind my knee" (04/07/2013)  . Stasis dermatitis     Past Surgical History:  Procedure Laterality Date    . ABDOMINAL HYSTERECTOMY  1991  . AXILLARY LYMPH NODE DISSECTION Right 1988  . BREAST LUMPECTOMY Right 1988  . BREAST SURGERY Right 10/2011   "cut out calcification" (04/07/2013)  . CARPAL TUNNEL RELEASE Right 1995  . CATARACT EXTRACTION W/ INTRAOCULAR LENS IMPLANT Right ~ 2006  . WOUND DEBRIDEMENT  09/03/2011   Procedure: DEBRIDEMENT WOUND;  Surgeon: Odis Hollingshead, MD;  Location: [facility];  Service: General;  Laterality: Right;    Social History:  reports that she quit smoking about 12 years ago. Her smoking use included Cigarettes. She has a 20.00 pack-year smoking history. She has never used smokeless tobacco. She reports that she does not drink alcohol or use drugs.  Family History:  Family History  Problem Relation Age of Onset  . Other Mother   . Other Father   . Hypertension       Prior to Admission medications   Medication Sig Start Date End Date Taking? Authorizing Provider  ALPRAZolam Duanne Moron) 1 MG tablet Take 1 mg by mouth 2 (two) times daily. 04/23/16  Yes Historical Provider, MD  Amino Acids-Protein Hydrolys (FEEDING SUPPLEMENT, PRO-STAT SUGAR FREE 64,) LIQD Take 30 mLs by mouth 2 (two) times daily. 03/16/15  Yes Eugenie Filler, MD  apixaban (ELIQUIS) 5 MG TABS tablet Take 5 mg by mouth 2 (two) times daily.   Yes Historical Provider, MD  diphenhydrAMINE (BENADRYL) 25 mg capsule Take 1 capsule (25 mg total) by mouth every 6 (six) hours as needed for allergies. 03/13/13  Yes Orson Eva, MD  furosemide (LASIX) 80 MG tablet Take 1 tablet (80 mg total) by mouth 2 (two) times daily. 03/16/15  Yes Eugenie Filler, MD  gabapentin (NEURONTIN) 300 MG capsule Take 1 capsule (300 mg total) by mouth at bedtime as needed (nerve pain). Patient taking differently: Take 300 mg by mouth at bedtime.  03/13/13  Yes Orson Eva, MD  HYDROcodone-acetaminophen (NORCO/VICODIN) 5-325 MG tablet Take 1 tablet by mouth every 6 (six) hours as needed for pain. 07/08/16  Yes Historical Provider, MD   loperamide (IMODIUM A-D) 2 MG tablet Take 2 mg by mouth 4 (four) times daily as needed for diarrhea or loose stools.   Yes Historical Provider, MD  LORazepam (ATIVAN) 0.5 MG tablet Take 0.25 mg by mouth every 12 (twelve) hours as needed for anxiety.   Yes Historical Provider, MD  nebivolol (BYSTOLIC) 2.5 MG tablet Take 1 tablet (2.5 mg total) by mouth daily. 03/16/15  Yes Eugenie Filler, MD  pantoprazole (PROTONIX) 20 MG tablet Take 20 mg by mouth daily. 07/02/16  Yes Historical Provider, MD  potassium chloride 20 MEQ TBCR Take 40 mEq by mouth 2 (two) times daily. 03/16/15  Yes Eugenie Filler, MD  saccharomyces boulardii (FLORASTOR) 250 MG capsule Take 1 capsule (250 mg total) by mouth 2 (two) times daily. 03/07/15  Yes Robbie Lis, MD  sertraline (ZOLOFT) 50 MG tablet TK 1 T PO  QHS. 01/21/15  Yes Historical Provider, MD  tiZANidine (ZANAFLEX) 2 MG tablet Take 2 mg by mouth 3 (three) times daily as needed for muscle spasms. 07/03/16  Yes Historical Provider, MD  traZODone (DESYREL) 100 MG tablet TK 1 T PO  HS. 02/09/15  Yes Historical Provider, MD  albuterol (PROVENTIL) (2.5 MG/3ML) 0.083% nebulizer solution Take 3 mLs (2.5 mg total) by nebulization every 6 (six) hours as needed for wheezing or shortness of breath. Patient not taking: Reported on 07/10/2016 03/07/15   Robbie Lis, MD  aspirin EC 81 MG tablet Take 1 tablet (81 mg total) by mouth daily. Patient not taking: Reported on 07/12/2016 04/08/13   Charlynne Cousins, MD  famotidine (PEPCID) 20 MG tablet Take 1 tablet (20 mg total) by mouth 2 (two) times daily. Patient not taking: Reported on 06/22/2016 03/07/15   Robbie Lis, MD  feeding supplement (BOOST / RESOURCE BREEZE) LIQD Take 1 Container by mouth 3 (three) times daily between meals. Patient not taking: Reported on 07/19/2016 03/16/15   Eugenie Filler, MD  rivaroxaban (XARELTO) 20 MG TABS tablet Take 1 tablet (20 mg total) by mouth daily with supper. Patient not taking: Reported  on 06/27/2016 03/16/15   Eugenie Filler, MD    Physical Exam: Vitals:   06/30/2016 2034 07/17/16 0010 07/17/16 0141 07/17/16 0440  BP: 118/70 112/91  (!) 159/140  Pulse: 95 90  105  Resp: 22 20  23   Temp:   (!) 96.1 F (35.6 C)   TempSrc:   Rectal   SpO2: 95% 100%  98%   General: Not in acute distress HEENT:       Eyes: PERRL, EOMI, no scleral icterus.       ENT: No discharge from the ears and nose, no pharynx injection, no tonsillar enlargement.        Neck: No JVD, no bruit, no mass felt. Heme: No neck lymph node enlargement. Cardiac: S1/S2, RRR, No murmurs, No gallops or rubs. Respiratory:  No rales, wheezing, rhonchi or rubs. GI: Soft, nondistended, has diffused tenderness, no rebound pain, no organomegaly, BS present. GU: No hematuria Ext: 1+ pitting leg edema bilaterally. 2+DP/PT pulse bilaterally. Musculoskeletal: No joint deformities, No joint redness or warmth, no limitation of ROM in spin. Skin: Has fungal infection in skinfolds Neuro: Alert, oriented X3, cranial nerves II-XII grossly intact, moves all extremities. Psych: Patient is not psychotic, no suicidal or hemocidal ideation.  Labs on Admission: I have personally reviewed following labs and imaging studies  CBC:  Recent Labs Lab 07/04/2016 2311  WBC 13.9*  NEUTROABS 11.7*  HGB 10.1*  HCT 30.9*  MCV 97.5  PLT 123456   Basic Metabolic Panel:  Recent Labs Lab 07/17/16 0037  NA 135  K 6.4*  CL 105  CO2 13*  GLUCOSE 67  BUN 190*  CREATININE 19.66*  CALCIUM 7.1*   GFR: CrCl cannot be calculated (Unknown ideal weight.). Liver Function Tests:  Recent Labs Lab 07/17/16 0037  AST 15  ALT 5*  ALKPHOS 100  BILITOT 0.9  PROT 7.1  ALBUMIN 1.9*    Recent Labs Lab 07/17/16 0037  LIPASE 671*   No results for input(s): AMMONIA in the last 168 hours. Coagulation Profile:  Recent Labs Lab 07/17/16 0100  INR 2.27   Cardiac Enzymes: No results for input(s): CKTOTAL, CKMB, CKMBINDEX, TROPONINI  in the last 168 hours. BNP (last 3 results) No results for input(s): PROBNP in the last 8760 hours. HbA1C: No results for input(s): HGBA1C in the last 72 hours. CBG:  Recent Labs Lab 07/07/2016 2357 07/17/16 0153 07/17/16 0244 07/17/16 0331 07/17/16 0445  GLUCAP 63* 63* 166* 71 117*  Lipid Profile: No results for input(s): CHOL, HDL, LDLCALC, TRIG, CHOLHDL, LDLDIRECT in the last 72 hours. Thyroid Function Tests: No results for input(s): TSH, T4TOTAL, FREET4, T3FREE, THYROIDAB in the last 72 hours. Anemia Panel: No results for input(s): VITAMINB12, FOLATE, FERRITIN, TIBC, IRON, RETICCTPCT in the last 72 hours. Urine analysis:    Component Value Date/Time   COLORURINE YELLOW 07/17/2016 0349   APPEARANCEUR TURBID (A) 07/17/2016 0349   LABSPEC 1.014 07/17/2016 0349   PHURINE 6.0 07/17/2016 0349   GLUCOSEU NEGATIVE 07/17/2016 0349   HGBUR LARGE (A) 07/17/2016 0349   BILIRUBINUR NEGATIVE 07/17/2016 0349   KETONESUR NEGATIVE 07/17/2016 0349   PROTEINUR 100 (A) 07/17/2016 0349   UROBILINOGEN 1.0 02/27/2015 2051   NITRITE NEGATIVE 07/17/2016 0349   LEUKOCYTESUR MODERATE (A) 07/17/2016 0349   Sepsis Labs: @LABRCNTIP (procalcitonin:4,lacticidven:4) )No results found for this or any previous visit (from the past 240 hour(s)).   Radiological Exams on Admission: US Renal  Result Date: 07/17/2016 CLINICAL DATA:  66 year old female with acute kidney insufficiency and elevated BUN and creatinine. EXAM: RENAL / URINARY TRACT ULTRASOUND COMPLETE COMPARISON:  None. FINDINGS: Evaluation is limited due to patient's body habitus. Right Kidney: Length: 10 cm. The kidney is atrophic and echogenic. There is poor visualization of the kidney. No definite echogenic stone or hydronephrosis. Left Kidney: Length: 13 cm.  The left kidney is poorly visualized. Bladder: Not well seen. IMPRESSION: Very limited study due to patient's body habitus. Atrophic and echogenic appearance of the right kidney.  Nonvisualization of the left kidney and urinary bladder. Electronically Signed   By: Anner Crete M.D.   On: 07/17/2016 03:41   Dg Chest Portable 1 View  Result Date: 07/19/2016 CLINICAL DATA:  Initial evaluation for acute cough, shortness of breath, fatigue. EXAM: PORTABLE CHEST 1 VIEW COMPARISON:  Prior radiograph from 03/09/2015. FINDINGS: Moderate cardiomegaly, stable from previous. Mediastinal silhouette within normal limits. Lungs mildly hypoinflated. Diffuse vascular congestion with interstitial prominence, compatible with pulmonary edema. Associated layering left pleural effusion. More confluent left basilar and left perihilar opacities may reflect edema/ atelectasis, although superimposed infiltrates not excluded. No pneumothorax. No acute osseus abnormality. Surgical clips overlie the right axilla. IMPRESSION: 1. Cardiomegaly with mild diffuse pulmonary edema and small left pleural effusion. 2. Slightly more confluent left perihilar and left basilar opacities, favored to reflect edema/atelectasis, although superimposed infiltrate could be considered in the correct clinical setting. Electronically Signed   By: Jeannine Boga M.D.   On: 07/10/2016 22:47     EKG: Independently reviewed.  Sinus rhythm, QTC 537, T-wave inversion in inferior leads, no T-wave peaking   Assessment/Plan Principal Problem:   Chest pain Active Problems:   BMI 70 and over, adult (HCC)   HTN (hypertension)   Obstructive sleep apnea on CPAP   PAF (paroxysmal atrial fibrillation) (HCC)   CAP (community acquired pneumonia)   Depression   Essential hypertension   Acute on chronic diastolic congestive heart failure (HCC)   Hypoglycemia   Generalized weakness   Pancreatitis   Chest pain: Etiology is not clear. Patient has a history of DVT, has not taking her Eliquis for 6 days, making PE a potential differential diagnosis. Pt has leukocytosis and chest x-ray showed left perihilar and left basilar  opacities, indicating possible CAP. will also need to rule out ACS.  -Will admit to SDU as inpt -start IV heparin -get V.Q scan in AM (not ordered yet) -trop x 3 -2e echo -ASA, Bystolic and prn NTG -treat possible CPA as below  CAP:  -  IV rocephin and doxycycline -blood culture x 2 -No sputum production for culture  Pancreatitis: her abdominal pain is likely caused by pancreatitis. Lipase 607.  -will check lipid profile to rule out hypertriglyceridemia -get CT-abd/pelvis  -When necessary morphine for pain -Patient has fluid overload due to CHF exacerbation, cannot be treated with IV fluid  AKI and hyperkalemia: Creatinine 19.66, BUN 190, bicarbonate 13, potassium 6.4. Etiology is not clear. Likely due to prerenal secondary to dehydration and continuation of diruetics, but this dose not seem to be able to explain such a severe AKI. Nephrology, Dr. Mercy Moore was consulted--> recommended to give high-dose Lasix 160 mg IV, Foley catheter placement, bicarbonate. -highly appreciate Dr. Etheleen Nicks recommendation -will give Lasix 160 mg IV x 1 -50 mEQ of  bicarbonate. -Foley catheter placement -will treat hyperkalemia: D50, 5 unit of  Insulin IV, 2g of calcium chloride, 50 mEq of sodium bicarbonate plus 45 g of Kayexalate -f/u by BMP - Check FeUrea - Avoid ACEI and NSAIDs  Acute on chronic diastolic congestive heart failure: 2-D echo on 03/11/15 showed EF of 55-60%. Patient has elevated BNP, leg edema, consistent with CHF exacerbation. -IV Lasix 160 mg 1 as above-->reevaluate her volume status in AM. -trop x 3 -2d echo -will continue home bystolic, ASA  Hypoglycemia: Blood sugar was 55 on admission. Etiology is not clear. Likely due to decreased oral intake. -check cortisol level -CBG every hour -When necessary D50 -D10 at 50 mL per hour  HTN (hypertension): -continue bystolic -On IV lasix  Hx of DVT: not taking Eliquis for 6 days. -on IV heparin as above  Atrial  Fibrillation: CHA2DS2-VASc Score is 4, needs oral anticoagulation. Patient is on Eliquis at home, but not complaint. INR is 2.27 on admission. Heart rate is well controlled. -continue bystolic -on IV heparin    Obstructive sleep apnea - on CPAP  Depression and anxiety: no suicidal or homicidal ideations. -Continue home medications: Ativan, Xanax -hold Zoloft for due to QTC prolongation  Positive urinalysis: Urinalysis has small (mom glucose side. Patient denies symptoms of UTI -Patient is on Rocephin for possible CAP -Follow up urine culture  Generalized weakness: Likely due to multifactorial etiology. -Treat underlying issues as above   DVT ppx: On IV Heparin  Code Status: Full code Family Communication: None at bed side.   Disposition Plan:  Anticipate discharge back to previous home environment Consults called:  Nephrology, Dr. Mercy Moore Admission status: SDU/inpation       Date of Service 07/17/2016    Ivor Costa [facility] Pager [phone]  If 7PM-7AM, please contact night-coverage [email or web address] Password [email or web address] 07/17/2016, 5:15 AM

## 2016-07-17 NOTE — Consult Note (Signed)
[place] Nurse wound consult note Reason for Consult: Chronic venous insufficiency/lymphedema with full and partial thickness tissue loss. Intertriginous dermatitis to skin folds. IAD from fecal incontinence. Full body assessment with the assistance of 6 nurses who are cleaning up patient after an episode of fecal incontinence.  Patient has Interim HHRNs who visit three times weekly. Wound type:Moisture, venous insufficiency Pressure Injury POA: No Measurement: LLE with numerous circumferential wounds, both full and partial thickness, the two largest of which measures 3cm x 2.6cm x 0.1cm on the posterior and lateral LEs. Wound UX:[id number], moist Drainage (amount, consistency, odor) scant serous exudate from the LE lesions and the intertriginous dermatitis. Periwound: Dry, intact.  Hemosiderin staining on the LEs. Dressing procedure/placement/frequency: I have provided conservative care orders for the LLE (xeroform gauze) and skin care orders for immediately following fecal incontinent episodes using our house products. Patient is on a bariatric bed with low air loss feature. InterDry Ag+, our house antimicrobial textile is provided for the skin folds to wick moisture and reduce bacteria. A one-time dose of systemic antifungal (eg, Diflucan) could assist with the expeditious resolution of fungal overgrowth in the skin folds. If you agree, please order. [place] nursing team will not follow, but will remain available to this patient, the nursing and medical teams.  Please re-consult if needed. Thanks, Maudie Flakes, MSN, RN, [place], Arther Abbott  Pager# [phone]

## 2016-07-17 NOTE — Procedures (Signed)
After local anesthesia with sterility preserved, the finder needle was advanced into the right IJ vessel under ultrasound guidance. Dark red non-pulsatile blood was aspirated. The wire was threaded into the right IJ vessel and needle removed. The wire was confirmed in the vein with ultrasound. A nick was made in the skin and the tract was dilated. The catheter was advanced over the wire into the vessel. The wire was removed. All ports aspirated and flushed easily. The catheter was sutured in place and biopatch and dressing were applied. No complications. Post procedure CXR as above. EBL <5cc.

## 2016-07-17 NOTE — Progress Notes (Signed)
PHARMACY NOTE:  ANTIMICROBIAL RENAL DOSAGE ADJUSTMENT  Current antimicrobial regimen includes a mismatch between antimicrobial dosage and estimated renal function.  As per policy approved by the Pharmacy & Therapeutics and Medical Executive Committees, the antimicrobial dosage will be adjusted accordingly.  Current antimicrobial dosage:  Doxy 200 mg IV q12h Indication: CAP  Renal Function:  CrCl cannot be calculated (Unknown ideal weight.). []      On intermittent HD, scheduled: []      On CRRT    Antimicrobial dosage has been changed to:  Doxy 200 mg x1 then 100mg  IV q12h  Additional comments:   Thank you for allowing pharmacy to be a part of this patient's care.  Dorrene German, [facility] 07/17/2016 5:28 AM

## 2016-07-17 NOTE — Plan of Care (Signed)
Consulted for vascular catheter for HD initiation.  Patient is on NoAC as an outpatient for Afib. She has been started on Heparin gtt here. PTT 45.   Discontinued heparin gtt. Called family- son Larkin Ina times 4, daughter Lenna Sciara times 2 for consent. I could not reach either person.   Right IJ Vasc Cath placed 10:26pm.  Discussed with Dr Elsworth Soho and [place]. Derinda Sis DO Pulmonary and Critical Care 07/17/16 10:27 PM

## 2016-07-17 NOTE — ED Notes (Signed)
No access visualized for blood draws.  Phlebotomist looked as well.  Main lab called to attempt blood draw.

## 2016-07-17 NOTE — Consult Note (Signed)
[name] is an 66 y.o. female referred by Dr Blaine Hamper   Chief Complaint: ARF, hyperkalemia, metabolic acidosis HPI: 55QI WF brought to hospital for weakness and decreasing mental status.  Says she has been feeling poorly for at least 2 weeks with poor PO intake.  + nausea, denies vomiting.  She has also noticed decrease in UO.  She is bed bound and says someone from a Dr office in [place] makes house calls.  No hx of renal failure.  No gross hematuria, stones or FH renal ds.  Denies NSAIDs. Last labs PTA were in 9/16 and Scr was 1.06. Now Scr 19.6, BUN 190, K 6.4.  She has received tx for her hyperkalemia in ER.  Past Medical History:  Diagnosis Date  . Anxiety   . Arthritis    "knees; fingers/hands" (04/07/2013)  . Breast cancer ([place]) 1988  . Calcification of right breast    chronic s/p WLE/XRT   . Cellulitis    right breast, right arm, ble  . Chronic bronchitis ([place])    "maybe 4 years in a row; last time was ~1990" (04/07/2013)  . Depression   . Dysrhythmia   . Exertional shortness of breath   . Hypertension   . Lymphedema of arm 2008, 2012   s/p Axillary LN dissection 1988 w Lumpectomy  . Obesities, morbid ([place])   . Obstructive sleep apnea on CPAP 06/04/2012  . PAF (paroxysmal atrial fibrillation) ([place]) 06/04/2012  . Pneumonia    "several times; last time was in the 1990's" (04/07/2013)  . Right leg DVT ([place]) 04/06/2013   "behind my knee" (04/07/2013)  . Stasis dermatitis     Past Surgical History:  Procedure Laterality Date  . ABDOMINAL HYSTERECTOMY  1991  . AXILLARY LYMPH NODE DISSECTION Right 1988  . BREAST LUMPECTOMY Right 1988  . BREAST SURGERY Right 10/2011   "cut out calcification" (04/07/2013)  . CARPAL TUNNEL RELEASE Right 1995  . CATARACT EXTRACTION W/ INTRAOCULAR LENS IMPLANT Right ~ 2006  . WOUND DEBRIDEMENT  09/03/2011   Procedure: DEBRIDEMENT WOUND;  Surgeon: Odis Hollingshead, MD;  Location: [facility];  Service: General;  Laterality: Right;    Family  History  Problem Relation Age of Onset  . Other Mother   . Other Father   . Hypertension     Social History:  reports that she quit smoking about 12 years ago. Her smoking use included Cigarettes. She has a 20.00 pack-year smoking history. She has never used smokeless tobacco. She reports that she does not drink alcohol or use drugs. Nonambulatory  Allergies:  Allergies  Allergen Reactions  . Effexor [Venlafaxine Hydrochloride] Anxiety    Made her want to kill herself     (Not in a hospital admission)   Lab Results: UA: TNTC RBC, TNTC WBC 100 protein  Recent Labs  07/14/2016 2311  WBC 13.9*  HGB 10.1*  HCT 30.9*  PLT 211   BMET  Recent Labs  07/17/16 0037  NA 135  K 6.4*  CL 105  CO2 13*  GLUCOSE 67  BUN 190*  CREATININE 19.66*  CALCIUM 7.1*   LFT  Recent Labs  07/17/16 0037  PROT 7.1  ALBUMIN 1.9*  AST 15  ALT 5*  ALKPHOS 100  BILITOT 0.9   Ct Abdomen Pelvis Wo Contrast  Result Date: 07/17/2016 CLINICAL DATA:  Generalize weakness over the last 2 days. Elevated white count. Elevated lipase. History of breast cancer. EXAM: CT ABDOMEN AND PELVIS WITHOUT CONTRAST TECHNIQUE: Multidetector CT imaging  of the abdomen and pelvis was performed following the standard protocol without IV contrast. COMPARISON:  Ultrasound same day FINDINGS: Lower chest: There are bilateral pleural effusions layering dependently. There is atelectasis and/or pneumonia in the dependent lower lungs bilaterally. The heart is enlarged. There is a small amount of pericardial fluid. Hepatobiliary: Liver parenchyma appears unremarkable without contrast. 1 cm calcified gallstone dependent in the gallbladder. No CT evidence of gallbladder inflammation. Pancreas: Pancreas is largely fatty replaced. One could question mild inflammatory change in the tail of the pancreas. This is not definite. This is certainly not advanced. Spleen: Normal Adrenals/Urinary Tract: Adrenal glands are normal. Kidneys show  mild cortical atrophy. I think there is a tiny nonobstructing stone in the right kidney. No evidence of obstructing stone, mass lesion or hydronephrosis on either side. Foley catheter in the bladder. Stomach/Bowel: No bowel pathology is seen. There is a periumbilical hernia containing fat and a small bowel loop, but there is no evidence of obstruction. Vascular/Lymphatic: Aortic atherosclerosis.  No aneurysm. Reproductive: Previous hysterectomy.  No pelvic mass. Other: No free fluid or air. Musculoskeletal: Chronic spinal degenerative changes. IMPRESSION: Probable acute pancreatitis affecting the tail of the pancreas. The findings are minimal at this point with only mild edema. No evidence of advanced disease. Bilateral pleural effusions with dependent atelectasis and/or pneumonia. Cardiomegaly and small pericardial effusion. Calcified gallstone dependent in the gallbladder. Ventral hernia containing a loop of small intestine without evidence of obstruction. Electronically Signed   By: Nelson Chimes M.D.   On: 07/17/2016 07:32   US Renal  Result Date: 07/17/2016 CLINICAL DATA:  66 year old female with acute kidney insufficiency and elevated BUN and creatinine. EXAM: RENAL / URINARY TRACT ULTRASOUND COMPLETE COMPARISON:  None. FINDINGS: Evaluation is limited due to patient's body habitus. Right Kidney: Length: 10 cm. The kidney is atrophic and echogenic. There is poor visualization of the kidney. No definite echogenic stone or hydronephrosis. Left Kidney: Length: 13 cm.  The left kidney is poorly visualized. Bladder: Not well seen. IMPRESSION: Very limited study due to patient's body habitus. Atrophic and echogenic appearance of the right kidney. Nonvisualization of the left kidney and urinary bladder. Electronically Signed   By: Anner Crete M.D.   On: 07/17/2016 03:41   Dg Chest Portable 1 View  Result Date: 06/24/2016 CLINICAL DATA:  Initial evaluation for acute cough, shortness of breath, fatigue.  EXAM: PORTABLE CHEST 1 VIEW COMPARISON:  Prior radiograph from 03/09/2015. FINDINGS: Moderate cardiomegaly, stable from previous. Mediastinal silhouette within normal limits. Lungs mildly hypoinflated. Diffuse vascular congestion with interstitial prominence, compatible with pulmonary edema. Associated layering left pleural effusion. More confluent left basilar and left perihilar opacities may reflect edema/ atelectasis, although superimposed infiltrates not excluded. No pneumothorax. No acute osseus abnormality. Surgical clips overlie the right axilla. IMPRESSION: 1. Cardiomegaly with mild diffuse pulmonary edema and small left pleural effusion. 2. Slightly more confluent left perihilar and left basilar opacities, favored to reflect edema/atelectasis, although superimposed infiltrate could be considered in the correct clinical setting. Electronically Signed   By: Jeannine Boga M.D.   On: 06/25/2016 22:47    ROS: Appetite poor Denies SOB Denies CP to me + ABd pain No dysuria Bedridden    PHYSICAL EXAM: Blood pressure 107/67, pulse 100, temperature (!) 96.1 F (35.6 C), temperature source Rectal, resp. rate 22, SpO2 97 %. HEENT: PERRLA EOMI NECK:obese LUNGS:Clear ant but BS distant CARDIAC:sl tachy reg distant sounds ABD:+ BS obese, mild diffuse tenderness, norebound OAC:[id number] edema in dependent areas of calves. Foul  smelling wound on Lt pretibial region NEURO:CNI, Ox3, + asterixis.  Only able to wiggle toes of legs GU; Foley bag draining pus  Assessment: 1. Presumably ARF possible due to poor PO intake +/- ATN.  Unclear if renal failure led to poor po intake or in poor po intake and intravascular volume depletion led to ARF 2. Probable UTI 3. Hyperkalemia 4. Met acidosis 5. Chronic wound Lt LE PLAN: 1. Will give another amp of bicarb. Getting D10 at 50cc 2. Urine culture 3. Send serologies 4. If renal fx does not improve in next 1-2d then she will need to be transferred  to [facility].  I discussed with her that chronic HD is not an option as she is bedridden but would do acute to see if renal fx improves, she agrees. 5. Daily SCr 6. Recheck K later today 7. Ask wound team to see pt about LLE wound If she were to need a central line then would ask that 3 lumen HD cath be placed in anticipation of HD 8.  Cont heparin for now and hold off on any oral anticoagulants as may need catheter placed  Bowyn Mercier T 07/17/2016, 7:53 AM

## 2016-07-17 NOTE — ED Notes (Signed)
MD at bedside. 

## 2016-07-17 NOTE — Progress Notes (Signed)
Lab unable to draw labs from pt.  IV team unable to place PICC line due to pt's creatinine level and clearance.  MD notified and is aware.  Will continue to monitor.

## 2016-07-17 NOTE — Progress Notes (Signed)
[facility]  Interval note   Patient admitted after midnight for full details se H&P   Patient seen and examined - Admitted with AKI with hyperkalemia - BUN/Cr 190/19.66 K 6.4 and bicarb 13, Pacreatitis, hypoglycemia , CAP and chest pain.   Patient morbidly obese, bed bound, poor IV access unable to get labs  Nephrology was consulted and recommended hydration and Lasix, but poor urine output at this point   Patient hemodynamically stable  AAO x 3 only c/o SOB at this moment   In anticipation of possible dialysis and the need for central line access will transfer patient to [place] for dialysis catheter insertion, VQ scan, Continue Lasix, check labs and monitor CBG. Diflucan added for intetrigo- Transfer to SDU  PCCM consulted, Nephrology on board Dr. Trudee Grip, MD

## 2016-07-17 NOTE — Progress Notes (Signed)
ANTICOAGULATION CONSULT NOTE - Initial Consult  Pharmacy Consult for IV heparin Indication: Hx of DVT and A-fib on chronic eliquis non-compliant now with CP, SOB and possible PE  Allergies  Allergen Reactions  . Effexor [Venlafaxine Hydrochloride] Anxiety    Made her want to kill herself    Patient Measurements: Old wt from 2016=156 kg  Please f/u when get to floor> requested wt from ED multiple times but no response Heparin Dosing Weight: 85 kg  Vital Signs: Temp: 96.1 F (35.6 C) (01/26 0141) Temp Source: Rectal (01/26 0141) BP: 112/91 (01/26 0010) Pulse Rate: 90 (01/26 0010)  Labs:  Recent Labs  07/09/2016 2311 07/17/16 0037  HGB 10.1*  --   HCT 30.9*  --   PLT 211  --   CREATININE  --  19.66*    CrCl cannot be calculated (Unknown ideal weight.).   Medical History: Past Medical History:  Diagnosis Date  . Anxiety   . Arthritis    "knees; fingers/hands" (04/07/2013)  . Breast cancer ([place]) 1988  . Calcification of right breast    chronic s/p WLE/XRT   . Cellulitis    right breast, right arm, ble  . Chronic bronchitis ([place])    "maybe 4 years in a row; last time was ~1990" (04/07/2013)  . Depression   . Dysrhythmia   . Exertional shortness of breath   . Hypertension   . Lymphedema of arm 2008, 2012   s/p Axillary LN dissection 1988 w Lumpectomy  . Obesities, morbid ([place])   . Obstructive sleep apnea on CPAP 06/04/2012  . PAF (paroxysmal atrial fibrillation) ([place]) 06/04/2012  . Pneumonia    "several times; last time was in the 1990's" (04/07/2013)  . Right leg DVT ([place]) 04/06/2013   "behind my knee" (04/07/2013)  . Stasis dermatitis     Medications:  Scheduled:  . ALPRAZolam  1 mg Oral BID  . aspirin EC  81 mg Oral Daily  . feeding supplement (PRO-STAT SUGAR FREE 64)  30 mL Oral BID  . furosemide  80 mg Oral BID  . gabapentin  300 mg Oral QHS  . nebivolol  2.5 mg Oral Daily  . nystatin   Topical BID  . pantoprazole  20 mg Oral Daily  .  saccharomyces boulardii  250 mg Oral BID  . sertraline  50 mg Oral Daily  . sodium bicarbonate  50 mEq Intravenous Once  . sodium polystyrene  45 g Oral Once  . traZODone  100 mg Oral QHS   Infusions:  . azithromycin    . calcium gluconate    . cefTRIAXone (ROCEPHIN)  IV    . dextrose    . furosemide      Assessment: 3 yoF c/o generalized weakness x 1 week on chronic eliquis for hx of A-fib and DVT.  Patient states not taken for 6 days now with CP and SOB, R/O PE.    Today, 1/26 -Baseline HL=1.36, aPtt=45, INR=2.27, Scr=19.66 - Will follow aPtt until HL correlate.  (HL affected by recent apixaban) Goal of Therapy:  Heparin level 0.3-0.7 units/ml Monitor platelets by anticoagulation protocol: Yes   Plan:  Baseline coags and Ht and Wt STAT Start heparin drip at 1400 units/hr  (No Bolus) Daily HL/CBC Aptt 8 hours after drip started    Dorrene German 07/17/2016,3:02 AM

## 2016-07-17 NOTE — ED Notes (Signed)
EKG given to EDP,Isaacs, MD., for review. 

## 2016-07-17 NOTE — Progress Notes (Signed)
Pt being transferred to [facility] for Possible HD.  CPAP QHS will be started once Pt arrives to room at [facility].  RT to monitor and assess as needed.

## 2016-07-18 ENCOUNTER — Inpatient Hospital Stay (HOSPITAL_COMMUNITY): Payer: Medicare Other

## 2016-07-18 DIAGNOSIS — I480 Paroxysmal atrial fibrillation: Secondary | ICD-10-CM

## 2016-07-18 DIAGNOSIS — E162 Hypoglycemia, unspecified: Secondary | ICD-10-CM

## 2016-07-18 DIAGNOSIS — E872 Acidosis, unspecified: Secondary | ICD-10-CM | POA: Diagnosis present

## 2016-07-18 DIAGNOSIS — N19 Unspecified kidney failure: Secondary | ICD-10-CM | POA: Diagnosis present

## 2016-07-18 DIAGNOSIS — Z992 Dependence on renal dialysis: Secondary | ICD-10-CM

## 2016-07-18 DIAGNOSIS — R079 Chest pain, unspecified: Secondary | ICD-10-CM

## 2016-07-18 DIAGNOSIS — K859 Acute pancreatitis without necrosis or infection, unspecified: Secondary | ICD-10-CM

## 2016-07-18 DIAGNOSIS — Z9989 Dependence on other enabling machines and devices: Secondary | ICD-10-CM

## 2016-07-18 DIAGNOSIS — R531 Weakness: Secondary | ICD-10-CM

## 2016-07-18 DIAGNOSIS — E875 Hyperkalemia: Secondary | ICD-10-CM

## 2016-07-18 DIAGNOSIS — B369 Superficial mycosis, unspecified: Secondary | ICD-10-CM

## 2016-07-18 DIAGNOSIS — R1013 Epigastric pain: Secondary | ICD-10-CM

## 2016-07-18 DIAGNOSIS — J189 Pneumonia, unspecified organism: Principal | ICD-10-CM

## 2016-07-18 DIAGNOSIS — R109 Unspecified abdominal pain: Secondary | ICD-10-CM | POA: Diagnosis present

## 2016-07-18 DIAGNOSIS — F329 Major depressive disorder, single episode, unspecified: Secondary | ICD-10-CM

## 2016-07-18 DIAGNOSIS — I10 Essential (primary) hypertension: Secondary | ICD-10-CM

## 2016-07-18 DIAGNOSIS — G4733 Obstructive sleep apnea (adult) (pediatric): Secondary | ICD-10-CM

## 2016-07-18 LAB — CBC WITH DIFFERENTIAL/PLATELET
BASOS ABS: 0 10*3/uL (ref 0.0–0.1)
Basophils Relative: 0 %
Eosinophils Absolute: 0.2 10*3/uL (ref 0.0–0.7)
Eosinophils Relative: 2 %
HEMATOCRIT: 29 % — AB (ref 36.0–46.0)
HEMOGLOBIN: 9.4 g/dL — AB (ref 12.0–15.0)
LYMPHS PCT: 16 %
Lymphs Abs: 1.8 10*3/uL (ref 0.7–4.0)
MCH: 31.5 pg (ref 26.0–34.0)
MCHC: 32.4 g/dL (ref 30.0–36.0)
MCV: 97.3 fL (ref 78.0–100.0)
MONO ABS: 0.5 10*3/uL (ref 0.1–1.0)
MONOS PCT: 5 %
NEUTROS ABS: 8.7 10*3/uL — AB (ref 1.7–7.7)
NEUTROS PCT: 77 %
Platelets: 210 10*3/uL (ref 150–400)
RBC: 2.98 MIL/uL — ABNORMAL LOW (ref 3.87–5.11)
RDW: 17 % — AB (ref 11.5–15.5)
WBC: 11.2 10*3/uL — ABNORMAL HIGH (ref 4.0–10.5)

## 2016-07-18 LAB — LIPASE, BLOOD: Lipase: 174 U/L — ABNORMAL HIGH (ref 11–51)

## 2016-07-18 LAB — RENAL FUNCTION PANEL
ANION GAP: 17 — AB (ref 5–15)
Albumin: 1.6 g/dL — ABNORMAL LOW (ref 3.5–5.0)
BUN: 191 mg/dL — ABNORMAL HIGH (ref 6–20)
CO2: 14 mmol/L — ABNORMAL LOW (ref 22–32)
Calcium: 7.2 mg/dL — ABNORMAL LOW (ref 8.9–10.3)
Chloride: 103 mmol/L (ref 101–111)
Creatinine, Ser: 19.29 mg/dL — ABNORMAL HIGH (ref 0.44–1.00)
GFR calc Af Amer: 2 mL/min — ABNORMAL LOW (ref >60)
GFR calc non Af Amer: 2 mL/min — ABNORMAL LOW (ref >60)
GLUCOSE: 96 mg/dL (ref 65–99)
PHOSPHORUS: 7.5 mg/dL — AB (ref 2.5–4.6)
Potassium: 5.3 mmol/L — ABNORMAL HIGH (ref 3.5–5.1)
Sodium: 134 mmol/L — ABNORMAL LOW (ref 135–145)

## 2016-07-18 LAB — GLUCOSE, CAPILLARY
Glucose-Capillary: 87 mg/dL (ref 65–99)
Glucose-Capillary: 90 mg/dL (ref 65–99)
Glucose-Capillary: 91 mg/dL (ref 65–99)

## 2016-07-18 LAB — UREA NITROGEN, URINE: Urea Nitrogen, Ur: 277 mg/dL

## 2016-07-18 MED ORDER — ZOLPIDEM TARTRATE 5 MG PO TABS: 5.0000 mg | ORAL_TABLET | Freq: Every evening | ORAL | Status: DC | PRN

## 2016-07-18 MED ORDER — TECHNETIUM TO 99M ALBUMIN AGGREGATED
3.0000 | Freq: Once | INTRAVENOUS | Status: AC | PRN
  Administered 2016-07-18: 3 via INTRAVENOUS

## 2016-07-18 MED ORDER — ONDANSETRON HCL 4 MG/2ML IJ SOLN
4.0000 mg | Freq: Four times a day (QID) | INTRAMUSCULAR | Status: DC | PRN
Start: 2016-07-18 — End: 2016-07-25
  Administered 2016-07-18 – 2016-07-19 (×2): 4 mg via INTRAVENOUS
  Filled 2016-07-18 (×2): qty 2

## 2016-07-18 MED ORDER — HEPARIN (PORCINE) IN NACL 100-0.45 UNIT/ML-% IJ SOLN
1800.0000 [IU]/h | INTRAMUSCULAR | Status: DC
  Administered 2016-07-18: 1350 [IU]/h via INTRAVENOUS
  Administered 2016-07-19 – 2016-07-22 (×4): 1250 [IU]/h via INTRAVENOUS
  Administered 2016-07-23: 1800 [IU]/h via INTRAVENOUS
  Filled 2016-07-18 (×7): qty 250

## 2016-07-18 MED ORDER — DEXTROSE-NACL 5-0.45 % IV SOLN
INTRAVENOUS | Status: DC
  Administered 2016-07-18: 13:00:00 via INTRAVENOUS
  Administered 2016-07-19: 1000 mL via INTRAVENOUS
  Administered 2016-07-21: 04:00:00 via INTRAVENOUS

## 2016-07-18 MED ORDER — CALCIUM CARBONATE ANTACID 1250 MG/5ML PO SUSP
500.0000 mg | Freq: Four times a day (QID) | ORAL | Status: DC | PRN
Start: 2016-07-18 — End: 2016-07-25
  Filled 2016-07-18: qty 5

## 2016-07-18 MED ORDER — NEPRO/CARBSTEADY PO LIQD
237.0000 mL | Freq: Three times a day (TID) | ORAL | Status: DC | PRN
Start: 2016-07-18 — End: 2016-07-25
  Filled 2016-07-18: qty 237

## 2016-07-18 MED ORDER — CAMPHOR-MENTHOL 0.5-0.5 % EX LOTN
1.0000 "application " | TOPICAL_LOTION | Freq: Three times a day (TID) | CUTANEOUS | Status: DC | PRN
  Filled 2016-07-18: qty 222

## 2016-07-18 MED ORDER — DOCUSATE SODIUM 283 MG RE ENEM
1.0000 | ENEMA | RECTAL | Status: DC | PRN
  Filled 2016-07-18: qty 1

## 2016-07-18 MED ORDER — SORBITOL 70 % SOLN: 30.0000 mL | Status: DC | PRN

## 2016-07-18 MED ORDER — HEPARIN BOLUS VIA INFUSION
4000.0000 [IU] | Freq: Once | INTRAVENOUS | Status: AC
  Administered 2016-07-18: 4000 [IU] via INTRAVENOUS
  Filled 2016-07-18: qty 4000

## 2016-07-18 MED ORDER — TECHNETIUM TC 99M DIETHYLENETRIAME-PENTAACETIC ACID: 30.0000 | Freq: Once | INTRAVENOUS | Status: DC | PRN

## 2016-07-18 MED ORDER — ONDANSETRON HCL 4 MG PO TABS: 4.0000 mg | ORAL_TABLET | Freq: Four times a day (QID) | ORAL | Status: DC | PRN

## 2016-07-18 MED ORDER — HYDROXYZINE HCL 25 MG PO TABS: 25.0000 mg | ORAL_TABLET | Freq: Three times a day (TID) | ORAL | Status: DC | PRN

## 2016-07-18 NOTE — Progress Notes (Signed)
ANTICOAGULATION CONSULT NOTE - Initial Consult  Pharmacy Consult for IV heparin Indication: Hx of DVT and A-fib on chronic eliquis non-compliant now with CP, SOB and possible PE  Allergies  Allergen Reactions  . Effexor [Venlafaxine Hydrochloride] Anxiety    Made her want to kill herself    Patient Measurements: TBW: 152 kg Heparin Dosing Weight: 91.54kg  Vital Signs: Temp: 97.8 F (36.6 C) (01/27 1237) Temp Source: Oral (01/27 1237) BP: 91/50 (01/27 1200) Pulse Rate: 116 (01/27 1237)  Labs:  Recent Labs  07/07/2016 2311 07/17/16 0037 07/17/16 0100 07/17/16 1025 07/17/16 1050 07/18/16 0954  HGB 10.1*  --   --   --   --  9.4*  HCT 30.9*  --   --   --   --  29.0*  PLT 211  --   --   --   --  210  APTT  --   --  45*  --   --   --   LABPROT  --   --  25.4*  --   --   --   INR  --   --  2.27  --   --   --   HEPARINUNFRC  --   --  1.36*  --   --   --   CREATININE  --  19.66*  --   --  18.29* 19.29*  TROPONINI  --   --   --  <0.03  --   --     Estimated Creatinine Clearance: 4.1 mL/min (by C-G formula based on SCr of 19.29 mg/dL (H)).   Medical History: Past Medical History:  Diagnosis Date  . Anxiety   . Arthritis    "knees; fingers/hands" (04/07/2013)  . Breast cancer ([place]) 1988  . Calcification of right breast    chronic s/p WLE/XRT   . Cellulitis    right breast, right arm, ble  . Chronic bronchitis ([place])    "maybe 4 years in a row; last time was ~1990" (04/07/2013)  . Depression   . Dysrhythmia   . Exertional shortness of breath   . Hypertension   . Lymphedema of arm 2008, 2012   s/p Axillary LN dissection 1988 w Lumpectomy  . Obesities, morbid ([place])   . Obstructive sleep apnea on CPAP 06/04/2012  . PAF (paroxysmal atrial fibrillation) ([place]) 06/04/2012  . Pneumonia    "several times; last time was in the 1990's" (04/07/2013)  . Right leg DVT ([place]) 04/06/2013   "behind my knee" (04/07/2013)  . Stasis dermatitis     Medications:  Scheduled:  .  ALPRAZolam  1 mg Oral BID  . aspirin EC  81 mg Oral Daily  . cefTRIAXone (ROCEPHIN)  IV  1 g Intravenous Q24H  . doxycycline (VIBRAMYCIN) IV  100 mg Intravenous Q12H  . feeding supplement (PRO-STAT SUGAR FREE 64)  30 mL Oral BID  . fluconazole  50 mg Oral Q24H  . gabapentin  300 mg Oral QHS  . heparin  4,000 Units Intravenous Once  . nebivolol  2.5 mg Oral Daily  . nystatin   Topical BID  . pantoprazole  20 mg Oral Daily  . saccharomyces boulardii  250 mg Oral BID   Infusions:  . dextrose 5 % and 0.45% NaCl 60 mL/hr at 07/18/16 1230  . heparin      Assessment: 70 yoF c/o generalized weakness x 1 week on chronic eliquis for hx of A-fib and DVT.  Patient states not taken for 6 days now  with CP and SOB, R/O PE.  Heparin was started at [facility], but held last night to place and HD catheter. Following placement, the patient was transferred to [facility] and heparin is now to be restarted. Hgb 9.4, Plts 210.  -Baseline HL=1.36, aPtt=45, from 0100 on 1/26 at [facility] -Will follow aPtt until HL correlate.  (HL affected by recent apixaban). Patient has no renal function and is being started on HD so apixaban may have stayed in the system longer than expected  Goal of Therapy:  Goal aPTT 66-102 Heparin level 0.3-0.7 units/ml Monitor platelets by anticoagulation protocol: Yes   Plan:  -Heparin 4000 units x1, then 1350 units/hr -8hr aPTT and HL -Daily CBC/HL/aPTT -Monitor S/Sx bleeding  Myer Peer Grayland Ormond), PharmD  [id number] Pharmacy Resident Pager: [phone] 07/18/2016 1:21 PM

## 2016-07-18 NOTE — Progress Notes (Signed)
PROGRESS NOTE    Miller Reffner  [id number] DOB: January 19, 1951 DOA: 07/07/2016 PCP: Alvester Chou, NP   Brief Narrative:  Patient is a 66 year old female history of morbid obesity bedbound for several months, DVT on chronic anticoagulation who hasn't taken her anticoagulation approximately 6 days prior to admission, hypertension, depression anxiety, atrial fibrillation Thomas obstructive sleep apnea, diastolic heart failure presented to the ED with generalized weakness, chest pain, shortness of breath and abdominal pain. Patient noted to be in acute renal failure with a creatinine of 19.66, BUN of 190, potassium of 6.4, bicarbonate of 13. Patient also noted with significant hyperkalemia, probable community-acquired pneumonia, chest pain, probable UTI, and acute pancreatitis.   Assessment & Plan:   Principal Problem:   Chest pain Active Problems:   Acute renal failure (HCC)   PAF (paroxysmal atrial fibrillation) (HCC)   Metabolic acidosis   Uremia, acute   BMI 70 and over, adult ([place])   HTN (hypertension)   Obstructive sleep apnea on CPAP   CAP (community acquired pneumonia)   Hyperkalemia   Dermatitis fungal   Depression   Essential hypertension   Acute on chronic diastolic congestive heart failure (HCC)   Hypoglycemia   Generalized weakness   Pancreatitis   Abdominal pain   S/P hemodialysis catheter insertion ([place])  #1 chest pain Questionable etiology. Likely multifactorial secondary to probable community-acquired pneumonia, volume overload secondary to acute renal failure, concern for possible PE as patient was offered anticoagulation for 6 days prior to admission with a history of DVT. Patient with clinical improvement. Initial set of cardiac enzymes negative. 2-D echo with EF of 55-60% with no wall motion abnormalities, no evidence of thrombus, small pericardial effusion identified circumferential to the heart. Urine pneumococcus antigen negative. Urine Legionella antigen  pending. Patient given a dose of IV Lasix 160 mg 1 yesterday however with no significant urine output. Continue empiric IV Rocephin and IV doxycycline for probable community-acquired pneumonia. V/Q scan pending. Patient likely for hemodialysis today. Follow.  #2 probable community-acquired pneumonia Chest x-ray. Urine pneumococcus antigen negative. Urine Legionella antigen pending. Patient currently afebrile. Labs pending this morning. Continue empiric IV Rocephin and IV doxycycline. Follow.  #3 acute pancreatitis Clinical improvement. Patient noted to have a elevated lipase on admission of 671. CT abdomen and pelvis consistent with an acute pancreatitis in the tail of the pancreas, with bilateral pleural effusions with dependent atelectasis or pneumonia, calcified gallstone dependent in the gallbladder. LFTs were not elevated. Repeat lipase pending. Labs pending. Unable to place patient on IV fluids secondary to acute renal failure. Trial of clear liquids today. Symptomatic treatment. Follow.  #4 acute renal failure with uremia/hyperkalemia/metabolic acidosis Patient presented with acute renal failure, with a creatinine of 19.66 on admission, BUN of 190, hyperkalemic. Morning labs pending. Questionable etiology. Consent for a prerenal azotemia as patient had poor oral intake prior to admission versus ATN. Patient did receive an amp of bicarbonate as well as D10 at 50 mL an hour yesterday. Daily labs pending. Patient is uremic on examination with asterixis, elevated BUN. Patient also noted to be hyperkalemic however morning labs are pending at this time. Patient has been seen in consultation by nephrology and patient for probable hemodialysis today. Nephrology following and appreciate input and recommendations.  #5 probable UTI Urine cultures pending. Patient on IV Rocephin.  #6 hyperkalemia Patient had received an amp of bicarbonate, D50 as well as some Kayexalate yesterday. Morning labs are  pending. Patient for probable hemodialysis today.  #7 metabolic acidosis Likely secondary  to acute renal failure. Morning labs pending. Patient for probable hemodialysis today.  #8 paroxysmal atrial fibrillation CHA2DS2VASC score = 4 Currently rate controlled on bystolic. Patient on IV heparin. Once no further procedures are to be done patient can be transitioned to her home dose of eliquis.  #9 history of DVT Patient has been off eliquis for 6 days prior to admission. VQ scan pending. On IV heparin.  #10 obstructive sleep apnea C Pap daily at bedtime.  #11 depression anxiety No suicidal or homicidal ideations. Continue Ativan, Xanax. Zoloft on hold.  #12 hypertension Bystolic  [id number] hypoglycemia Follow CBGs. Will start patient on clears.  #14 acute on chronic diastolic heart failure Patient noted to have elevated BUN on examination. Some improvement with lower extremity edema as patient did receive some IV Lasix. Cardiac enzymes negative 1. 2-D echo with a EF of 55-60% with no wall motion abnormalities. Patient with no significant response to IV diuretics. Patient for probable hemodialysis today. Follow.  #15 morbid obesity/debility Patient states she's been bedbound for approximately 5-6 months. PT/OT.  #16 Intertriginous dermatitis Patient on daily Diflucan as well as into dry.  #17 left lower extremity circumferential wounds Patient has been assessed by the wound care nurse and continue current recommendations.   DVT prophylaxis: Heparin Code Status: Full Family Communication: Updated patient. No family at bedside. Disposition Plan: Home once clinically improved, acute renal failure resolved and per nephrology.   Consultants:   Nephrology: Dr. Mercy Moore 07/17/2016  Procedures:   Right IJ catheter placed per pulmonary 07/17/2016  CT abdomen and pelvis 07/17/2016  Chest x-ray 07/17/2016  Renal ultrasound 07/17/2016  2-D echo 07/17/2016    Antimicrobials:     IV Rocephin 07/17/2016  IV doxycycline 07/17/2016   Subjective: Patient denies any abdominal pain. Patient denies any current chest pain. Patient states some improvement with her breathing.  Objective: Vitals:   07/17/16 1913 07/18/16 0037 07/18/16 0041 07/18/16 0300  BP:  (!) 86/69 104/86 108/73  Pulse:  93 87 84  Resp:  (!) 24 (!) 22 17  Temp: 97.5 F (36.4 C) 97.6 F (36.4 C)  97.6 F (36.4 C)  TempSrc: Oral Oral  Oral  SpO2:  100% 100% 100%  Weight:  (!) 152 kg (335 lb)    Height:  5\' 3"  (1.6 m)      Intake/Output Summary (Last 24 hours) at 07/18/16 1000 Last data filed at 07/18/16 0603  Gross per 24 hour  Intake          2115.93 ml  Output               70 ml  Net          2045.93 ml   Filed Weights   07/17/16 1200 07/18/16 0037  Weight: (!) 156 kg (344 lb) (!) 152 kg (335 lb)    Examination:  General exam: Appears calm and comfortable.Asterixis. Morbidly obese. Respiratory system: Bibasilar crackles and decreased BS in bases. Respiratory effort normal. Cardiovascular system: Irregularly irregular. S1 & S2 heard, RRR. No JVD, murmurs, rubs, gallops or clicks. No pedal edema. Gastrointestinal system: Abdomen is nondistended, soft and nontender. No organomegaly or masses felt. Normal bowel sounds heard. Central nervous system: Alert and oriented. No focal neurological deficits. Extremities: Symmetric 5 x 5 power. Skin: No rashes, lesions or ulcers Psychiatry: Judgement and insight appear normal. Mood & affect appropriate.     Data Reviewed: I have personally reviewed following labs and imaging studies  CBC:  Recent Labs Lab  07/19/2016 2311  WBC 13.9*  NEUTROABS 11.7*  HGB 10.1*  HCT 30.9*  MCV 97.5  PLT [id number]   Basic Metabolic Panel:  Recent Labs Lab 07/17/16 0037 07/17/16 1050  NA 135 134*  K 6.4* 6.0*  CL 105 99*  CO2 13* 14*  GLUCOSE 67 77  BUN 190* 190*  CREATININE 19.66* 18.29*  CALCIUM 7.1* 6.9*  PHOS  --  8.3*   GFR: Estimated  Creatinine Clearance: 4.3 mL/min (by C-G formula based on SCr of 18.29 mg/dL (H)). Liver Function Tests:  Recent Labs Lab 07/17/16 0037 07/17/16 1050  AST 15  --   ALT 5*  --   ALKPHOS 100  --   BILITOT 0.9  --   PROT 7.1  --   ALBUMIN 1.9* 1.9*    Recent Labs Lab 07/17/16 0037  LIPASE 671*   No results for input(s): AMMONIA in the last 168 hours. Coagulation Profile:  Recent Labs Lab 07/17/16 0100  INR 2.27   Cardiac Enzymes:  Recent Labs Lab 07/17/16 1025  TROPONINI <0.03   BNP (last 3 results) No results for input(s): PROBNP in the last 8760 hours. HbA1C: No results for input(s): HGBA1C in the last 72 hours. CBG:  Recent Labs Lab 07/17/16 1643 07/17/16 1945 07/17/16 2335 07/18/16 0303 07/18/16 0741  GLUCAP 85 99 91 87 90   Lipid Profile:  Recent Labs  07/17/16 1050  CHOL 64  HDL 17*  LDLCALC 16  TRIG 155*  CHOLHDL 3.8   Thyroid Function Tests:  Recent Labs  07/17/16 1025  TSH 3.524   Anemia Panel: No results for input(s): VITAMINB12, FOLATE, FERRITIN, TIBC, IRON, RETICCTPCT in the last 72 hours. Sepsis Labs:  Recent Labs Lab 07/17/16 0047  LATICACIDVEN 1.75    Recent Results (from the past 240 hour(s))  Urine culture     Status: Abnormal (Preliminary result)   Collection Time: 07/17/16  3:49 AM  Result Value Ref Range Status   Specimen Description URINE, RANDOM  Final   Special Requests NONE  Final   Culture >=100,000 COLONIES/mL GRAM NEGATIVE RODS (A)  Final   Report Status PENDING  Incomplete  MRSA PCR Screening     Status: None   Collection Time: 07/17/16 12:31 PM  Result Value Ref Range Status   MRSA by PCR NEGATIVE NEGATIVE Final    Comment:        The GeneXpert MRSA Assay (FDA approved for NASAL specimens only), is one component of a comprehensive MRSA colonization surveillance program. It is not intended to diagnose MRSA infection nor to guide or monitor treatment for MRSA infections.           Radiology Studies: Ct Abdomen Pelvis Wo Contrast  Result Date: 07/17/2016 CLINICAL DATA:  Generalize weakness over the last 2 days. Elevated white count. Elevated lipase. History of breast cancer. EXAM: CT ABDOMEN AND PELVIS WITHOUT CONTRAST TECHNIQUE: Multidetector CT imaging of the abdomen and pelvis was performed following the standard protocol without IV contrast. COMPARISON:  Ultrasound same day FINDINGS: Lower chest: There are bilateral pleural effusions layering dependently. There is atelectasis and/or pneumonia in the dependent lower lungs bilaterally. The heart is enlarged. There is a small amount of pericardial fluid. Hepatobiliary: Liver parenchyma appears unremarkable without contrast. 1 cm calcified gallstone dependent in the gallbladder. No CT evidence of gallbladder inflammation. Pancreas: Pancreas is largely fatty replaced. One could question mild inflammatory change in the tail of the pancreas. This is not definite. This is certainly not advanced. Spleen: Normal  Adrenals/Urinary Tract: Adrenal glands are normal. Kidneys show mild cortical atrophy. I think there is a tiny nonobstructing stone in the right kidney. No evidence of obstructing stone, mass lesion or hydronephrosis on either side. Foley catheter in the bladder. Stomach/Bowel: No bowel pathology is seen. There is a periumbilical hernia containing fat and a small bowel loop, but there is no evidence of obstruction. Vascular/Lymphatic: Aortic atherosclerosis.  No aneurysm. Reproductive: Previous hysterectomy.  No pelvic mass. Other: No free fluid or air. Musculoskeletal: Chronic spinal degenerative changes. IMPRESSION: Probable acute pancreatitis affecting the tail of the pancreas. The findings are minimal at this point with only mild edema. No evidence of advanced disease. Bilateral pleural effusions with dependent atelectasis and/or pneumonia. Cardiomegaly and small pericardial effusion. Calcified gallstone dependent in the  gallbladder. Ventral hernia containing a loop of small intestine without evidence of obstruction. Electronically Signed   By: Nelson Chimes M.D.   On: 07/17/2016 07:32   US Renal  Result Date: 07/17/2016 CLINICAL DATA:  66 year old female with acute kidney insufficiency and elevated BUN and creatinine. EXAM: RENAL / URINARY TRACT ULTRASOUND COMPLETE COMPARISON:  None. FINDINGS: Evaluation is limited due to patient's body habitus. Right Kidney: Length: 10 cm. The kidney is atrophic and echogenic. There is poor visualization of the kidney. No definite echogenic stone or hydronephrosis. Left Kidney: Length: 13 cm.  The left kidney is poorly visualized. Bladder: Not well seen. IMPRESSION: Very limited study due to patient's body habitus. Atrophic and echogenic appearance of the right kidney. Nonvisualization of the left kidney and urinary bladder. Electronically Signed   By: Anner Crete M.D.   On: 07/17/2016 03:41   Dg Chest Port 1 View  Result Date: 07/17/2016 CLINICAL DATA:  Status post hemodialysis catheter insertion. EXAM: PORTABLE CHEST 1 VIEW COMPARISON:  None. FINDINGS: 2221 hours. The cardio pericardial silhouette is enlarged. There is pulmonary vascular congestion without overt pulmonary edema. Left base collapse/ consolidation is stable. Right IJ central line tip projects at the level of the mid SVC. No evidence for right pneumothorax IMPRESSION: Right IJ central line tip in SVC level without evidence for right pneumothorax. Cardiomegaly with vascular congestion and persistent left base collapse/consolidation with effusion. Electronically Signed   By: Misty Stanley M.D.   On: 07/17/2016 22:49   Dg Chest Portable 1 View  Result Date: 07/09/2016 CLINICAL DATA:  Initial evaluation for acute cough, shortness of breath, fatigue. EXAM: PORTABLE CHEST 1 VIEW COMPARISON:  Prior radiograph from 03/09/2015. FINDINGS: Moderate cardiomegaly, stable from previous. Mediastinal silhouette within normal  limits. Lungs mildly hypoinflated. Diffuse vascular congestion with interstitial prominence, compatible with pulmonary edema. Associated layering left pleural effusion. More confluent left basilar and left perihilar opacities may reflect edema/ atelectasis, although superimposed infiltrates not excluded. No pneumothorax. No acute osseus abnormality. Surgical clips overlie the right axilla. IMPRESSION: 1. Cardiomegaly with mild diffuse pulmonary edema and small left pleural effusion. 2. Slightly more confluent left perihilar and left basilar opacities, favored to reflect edema/atelectasis, although superimposed infiltrate could be considered in the correct clinical setting. Electronically Signed   By: Jeannine Boga M.D.   On: 07/15/2016 22:47        Scheduled Meds: . ALPRAZolam  1 mg Oral BID  . aspirin EC  81 mg Oral Daily  . cefTRIAXone (ROCEPHIN)  IV  1 g Intravenous Q24H  . doxycycline (VIBRAMYCIN) IV  100 mg Intravenous Q12H  . feeding supplement (PRO-STAT SUGAR FREE 64)  30 mL Oral BID  . fluconazole  50 mg Oral  Q24H  . gabapentin  300 mg Oral QHS  . lidocaine      . nebivolol  2.5 mg Oral Daily  . nystatin   Topical BID  . pantoprazole  20 mg Oral Daily  . saccharomyces boulardii  250 mg Oral BID   Continuous Infusions: . dextrose 50 mL/hr at 07/18/16 0043     LOS: 1 day    Time spent: 37 mins    THOMPSON,DANIEL, MD [facility] Pager [phone]  If 7PM-7AM, please contact night-coverage [email or web address] Password [email or web address] 07/18/2016, 10:00 AM

## 2016-07-18 NOTE — Progress Notes (Signed)
  [facility] Progress Note   Subjective: alert, jerking UE's  Vitals:   07/17/16 1913 07/18/16 0037 07/18/16 0041 07/18/16 0300  BP:  (!) 86/69 104/86 108/73  Pulse:  93 87 84  Resp:  (!) 24 (!) 22 17  Temp: 97.5 F (36.4 C) 97.6 F (36.4 C)  97.6 F (36.4 C)  TempSrc: Oral Oral  Oral  SpO2:  100% 100% 100%  Weight:  (!) 152 kg (335 lb)    Height:  5\' 3"  (1.6 m)      Inpatient medications: . ALPRAZolam  1 mg Oral BID  . aspirin EC  81 mg Oral Daily  . cefTRIAXone (ROCEPHIN)  IV  1 g Intravenous Q24H  . doxycycline (VIBRAMYCIN) IV  100 mg Intravenous Q12H  . feeding supplement (PRO-STAT SUGAR FREE 64)  30 mL Oral BID  . fluconazole  50 mg Oral Q24H  . gabapentin  300 mg Oral QHS  . lidocaine      . nebivolol  2.5 mg Oral Daily  . nystatin   Topical BID  . pantoprazole  20 mg Oral Daily  . saccharomyces boulardii  250 mg Oral BID   . dextrose 50 mL/hr at 07/18/16 0043   acetaminophen, albuterol, dextrose, diphenhydrAMINE, HYDROcodone-acetaminophen, loperamide, LORazepam, morphine injection, nitroGLYCERIN, tiZANidine, zolpidem  Exam: Morbidly obese, awake , slurred speech, responds appropriately No jvd Chest clear ant/ lat RRR no mrg Abd obese, nontender, +bs Ext chronic skin chgs, no sig UE/ LE edema Neuro +asterixis, +oriented to year/ place/ person      Assessment: 1. Renal failure - not sure acute vs chronic. Last creat here was 1.0 in 2016.  Pt is not long-term HD candidate but would like short-term HD to be done while waiting to see if renal fxn recovers. She is uremic now with asterixis, plan acute HD today.   2. CAP - on IV abx, RLL infiltrate 3. CHF - doubt she has CHF, poor CXR due to obesity, vascular crowding 4. Chest pain - per primary, on IV hep empirically 5. Abd pain - lipase ^605, poss acute pancreatitis 6. Vol depletion - due to diuretics/ diarrhea 7. UTI - urine cx + 8. 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 Debility/ bedbound x 4  yrs 10. Hyperkalemia - renal diet, HD 11. Chronic wound left LE   Plan - cont IVF"s, ^ 60/hr, acute HD #1 today   Kelly Splinter MD [facility] pager [phone]   07/18/2016, 10:05 AM    Recent Labs Lab 07/17/16 0037 07/17/16 1050  NA 135 134*  K 6.4* 6.0*  CL 105 99*  CO2 13* 14*  GLUCOSE 67 77  BUN 190* 190*  CREATININE 19.66* 18.29*  CALCIUM 7.1* 6.9*  PHOS  --  8.3*    Recent Labs Lab 07/17/16 0037 07/17/16 1050  AST 15  --   ALT 5*  --   ALKPHOS 100  --   BILITOT 0.9  --   PROT 7.1  --   ALBUMIN 1.9* 1.9*    Recent Labs Lab 07/21/2016 2311  WBC 13.9*  NEUTROABS 11.7*  HGB 10.1*  HCT 30.9*  MCV 97.5  PLT 211   Iron/TIBC/Ferritin/ %Sat No results found for: IRON, TIBC, FERRITIN, IRONPCTSAT

## 2016-07-18 NOTE — Progress Notes (Signed)
Pt arrived per bed from Dialysis.

## 2016-07-18 NOTE — Progress Notes (Signed)
Pt refusing CPAP for tonight. I told her to call if she changes her mind

## 2016-07-19 DIAGNOSIS — B9620 Unspecified Escherichia coli [E. coli] as the cause of diseases classified elsewhere: Secondary | ICD-10-CM | POA: Diagnosis present

## 2016-07-19 DIAGNOSIS — N390 Urinary tract infection, site not specified: Secondary | ICD-10-CM

## 2016-07-19 LAB — APTT
APTT: 123 s — AB (ref 24–36)
APTT: 50 s — AB (ref 24–36)

## 2016-07-19 LAB — CBC
HCT: 25.7 % — ABNORMAL LOW (ref 36.0–46.0)
Hemoglobin: 8.4 g/dL — ABNORMAL LOW (ref 12.0–15.0)
MCH: 31.5 pg (ref 26.0–34.0)
MCHC: 32.7 g/dL (ref 30.0–36.0)
MCV: 96.3 fL (ref 78.0–100.0)
PLATELETS: 181 10*3/uL (ref 150–400)
RBC: 2.67 MIL/uL — ABNORMAL LOW (ref 3.87–5.11)
RDW: 16.8 % — AB (ref 11.5–15.5)
WBC: 11 10*3/uL — AB (ref 4.0–10.5)

## 2016-07-19 LAB — COMPREHENSIVE METABOLIC PANEL
ALT: 8 U/L — ABNORMAL LOW (ref 14–54)
AST: 13 U/L — AB (ref 15–41)
Albumin: 1.5 g/dL — ABNORMAL LOW (ref 3.5–5.0)
Alkaline Phosphatase: 95 U/L (ref 38–126)
Anion gap: 11 (ref 5–15)
BUN: 94 mg/dL — AB (ref 6–20)
CHLORIDE: 100 mmol/L — AB (ref 101–111)
CO2: 23 mmol/L (ref 22–32)
Calcium: 7.2 mg/dL — ABNORMAL LOW (ref 8.9–10.3)
Creatinine, Ser: 11.11 mg/dL — ABNORMAL HIGH (ref 0.44–1.00)
GFR calc Af Amer: 4 mL/min — ABNORMAL LOW (ref >60)
GFR, EST NON AFRICAN AMERICAN: 3 mL/min — AB (ref >60)
Glucose, Bld: 90 mg/dL (ref 65–99)
POTASSIUM: 3.7 mmol/L (ref 3.5–5.1)
SODIUM: 134 mmol/L — AB (ref 135–145)
Total Bilirubin: 0.6 mg/dL (ref 0.3–1.2)
Total Protein: 6.7 g/dL (ref 6.5–8.1)

## 2016-07-19 LAB — LIPASE, BLOOD: LIPASE: 91 U/L — AB (ref 11–51)

## 2016-07-19 LAB — GLUCOSE, CAPILLARY
GLUCOSE-CAPILLARY: 84 mg/dL (ref 65–99)
GLUCOSE-CAPILLARY: 82 mg/dL (ref 65–99)
GLUCOSE-CAPILLARY: 86 mg/dL (ref 65–99)
Glucose-Capillary: 97 mg/dL (ref 65–99)
Glucose-Capillary: 98 mg/dL (ref 65–99)

## 2016-07-19 LAB — URINE CULTURE

## 2016-07-19 LAB — HEPATITIS B CORE ANTIBODY, TOTAL: HEP B C TOTAL AB: NEGATIVE

## 2016-07-19 LAB — HEPATITIS B SURFACE ANTIBODY,QUALITATIVE: Hep B S Ab: NONREACTIVE

## 2016-07-19 LAB — HEPARIN LEVEL (UNFRACTIONATED)
Heparin Unfractionated: 0.8 IU/mL — ABNORMAL HIGH (ref 0.30–0.70)
Heparin Unfractionated: 0.53 IU/mL (ref 0.30–0.70)

## 2016-07-19 LAB — HEPATITIS B SURFACE ANTIGEN: Hepatitis B Surface Ag: NEGATIVE

## 2016-07-19 MED ORDER — DOXYCYCLINE HYCLATE 100 MG IV SOLR
100.0000 mg | Freq: Two times a day (BID) | INTRAVENOUS | Status: DC
  Administered 2016-07-19 – 2016-07-20 (×4): 100 mg via INTRAVENOUS
  Filled 2016-07-19 (×7): qty 100

## 2016-07-19 NOTE — Progress Notes (Signed)
[facility] Progress Note   Subjective: bp's up some, pt resting. No c/o. Had HD yest first time and BUN/ Cr sig down today  Vitals:   07/19/16 0600 07/19/16 0700 07/19/16 0800 07/19/16 1153  BP: (!) 103/50 (!) 111/57    Pulse: 66 72    Resp: 11 11    Temp:   97.5 F (36.4 C) (!) 96.7 F (35.9 C)  TempSrc:   Axillary Axillary  SpO2: 100% 98%    Weight:      Height:        Inpatient medications: . ALPRAZolam  1 mg Oral BID  . aspirin EC  81 mg Oral Daily  . cefTRIAXone (ROCEPHIN)  IV  1 g Intravenous Q24H  . doxycycline (VIBRAMYCIN) IV  100 mg Intravenous Q12H  . feeding supplement (PRO-STAT SUGAR FREE 64)  30 mL Oral BID  . fluconazole  50 mg Oral Q24H  . gabapentin  300 mg Oral QHS  . nebivolol  2.5 mg Oral Daily  . nystatin   Topical BID  . pantoprazole  20 mg Oral Daily  . saccharomyces boulardii  250 mg Oral BID   . dextrose 5 % and 0.45% NaCl Stopped (07/18/16 2030)  . heparin 1,250 Units/hr (07/19/16 0807)   acetaminophen, albuterol, calcium carbonate (dosed in mg elemental calcium), camphor-menthol **AND** hydrOXYzine, dextrose, diphenhydrAMINE, docusate sodium, feeding supplement (NEPRO CARB STEADY), HYDROcodone-acetaminophen, loperamide, LORazepam, morphine injection, nitroGLYCERIN, ondansetron **OR** ondansetron (ZOFRAN) IV, sorbitol, technetium TC 57M diethylenetriame-pentaacetic acid, tiZANidine, zolpidem  Exam: Morbidly obese, responds appropriately No jvd Chest clear ant/ lat RRR no mrg Abd obese, nontender, +bs Ext chronic skin chgs, no sig UE/ LE edema Neuro no asterixis, resolved, +oriented to year/ place/ person      Assessment: 1. Renal failure - dehydration from diarrheal illness/ diuretics +/- ATN.  Last creat here was 1.0 in 2016.  Pt is not long-term HD candidate. Getting short-term HD while awaiting to see if renal fxn recovers. Cont IVF at 60 cc/hr.  2. Uremia - improved, asterixis resolved, sp HD yest 3. HCAP - on IV abx,  RLL infiltrate 4. Volume - doubt CHF clinically, CXR w vasc crowding 5. Chest pain - per primary, on IV hep empirically 6. Abd pain - lipase ^605, poss acute pancreatitis 7. UTI - urine cx+ 8. 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 Debility/ bedbound x 4 yrs 10. Hyperkalemia - renal diet, HD 11. Chronic wound left LE 12. EOL - would address code status, she is full code now but was DNR on her last admit here in Sept 2017.     Plan - HD Monday, IVF   Kelly Splinter MD [facility] pager [phone]   07/19/2016, 1:08 PM    Recent Labs Lab 07/17/16 1050 07/18/16 0954 07/19/16 0335  NA 134* 134* 134*  K 6.0* 5.3* 3.7  CL 99* 103 100*  CO2 14* 14* 23  GLUCOSE 77 96 90  BUN 190* 191* 94*  CREATININE 18.29* 19.29* 11.11*  CALCIUM 6.9* 7.2* 7.2*  PHOS 8.3* 7.5*  --     Recent Labs Lab 07/17/16 0037 07/17/16 1050 07/18/16 0954 07/19/16 0335  AST 15  --   --  13*  ALT 5*  --   --  8*  ALKPHOS 100  --   --  95  BILITOT 0.9  --   --  0.6  PROT 7.1  --   --  6.7  ALBUMIN 1.9* 1.9* 1.6* 1.5*  Recent Labs Lab 07/20/2016 2311 07/18/16 0954 07/19/16 0335  WBC 13.9* 11.2* 11.0*  NEUTROABS 11.7* 8.7*  --   HGB 10.1* 9.4* 8.4*  HCT 30.9* 29.0* 25.7*  MCV 97.5 97.3 96.3  PLT 211 210 181   Iron/TIBC/Ferritin/ %Sat No results found for: IRON, TIBC, FERRITIN, IRONPCTSAT

## 2016-07-19 NOTE — Progress Notes (Signed)
PROGRESS NOTE    [name]  [id number] DOB: Sep 23, 1950 DOA: 07/09/2016 PCP: Alvester Chou, NP   Brief Narrative:  Patient is a 66 year old female history of morbid obesity bedbound for several months, DVT on chronic anticoagulation who hasn't taken her anticoagulation approximately 6 days prior to admission, hypertension, depression anxiety, atrial fibrillation Thomas obstructive sleep apnea, diastolic heart failure presented to the ED with generalized weakness, chest pain, shortness of breath and abdominal pain. Patient noted to be in acute renal failure with a creatinine of 19.66, BUN of 190, potassium of 6.4, bicarbonate of 13. Patient also noted with significant hyperkalemia, probable community-acquired pneumonia, chest pain, probable UTI, and acute pancreatitis.   Assessment & Plan:   Principal Problem:   Chest pain Active Problems:   Acute renal failure (HCC)   PAF (paroxysmal atrial fibrillation) (HCC)   Metabolic acidosis   Uremia, acute   BMI 70 and over, adult ([place])   HTN (hypertension)   Obstructive sleep apnea on CPAP   CAP (community acquired pneumonia)   Hyperkalemia   Dermatitis fungal   Depression   Essential hypertension   Acute on chronic diastolic congestive heart failure (HCC)   Hypoglycemia   Generalized weakness   Pancreatitis   Abdominal pain   S/P hemodialysis catheter insertion (HCC)   E. coli UTI  #1 chest pain Questionable etiology. Likely multifactorial secondary to probable community-acquired pneumonia, volume overload secondary to acute renal failure, concern for possible PE as patient was off her anticoagulation for 6 days prior to admission with a history of DVT. Patient with clinical improvement. Initial set of cardiac enzymes negative. 2-D echo with EF of 55-60% with no wall motion abnormalities, no evidence of thrombus, small pericardial effusion identified circumferential to the heart. Urine pneumococcus antigen negative. Urine Legionella  antigen pending. Patient given a dose of IV Lasix 160 mg 1 yesterday however with no significant urine output. Continue empiric IV Rocephin and IV doxycycline for probable community-acquired pneumonia. V/Q scan negative for PE. Patient likely for hemodialysis today. Follow.  #2 probable community-acquired pneumonia Chest x-ray. Urine pneumococcus antigen negative. Urine Legionella antigen pending. Patient currently afebrile. Labs pending this morning. Continue empiric IV Rocephin and IV doxycycline. Follow.  #3 acute pancreatitis Clinical improvement. Patient noted to have a elevated lipase on admission of 671. CT abdomen and pelvis consistent with an acute pancreatitis in the tail of the pancreas, with bilateral pleural effusions with dependent atelectasis or pneumonia, calcified gallstone dependent in the gallbladder. LFTs were not elevated. Repeat lipase pending. Labs pending. Unable to place patient on IV fluids secondary to acute renal failure. Trial of clear liquids was attempted on 07/18/2016 however patient was noted to be nauseous with dry heaves and abdominal pain right afterwards. Symptomatic treatment. Follow.  #4 acute renal failure with uremia/hyperkalemia/metabolic acidosis Patient presented with acute renal failure, with a creatinine of 19.66 on admission, BUN of 190, hyperkalemic. Morning labs with improvement in renal function, metabolic acidosis, hyperkalemia after dialysis yesterday 07/18/2016.  Questionable etiology. Consent for a prerenal azotemia as patient had poor oral intake prior to admission versus ATN. Patient did receive an amp of bicarbonate as well as D10 at 50 mL an hour yesterday. Daily labs pending. Patient is uremic on examination with asterixis, elevated BUN. Patient also noted to be hyperkalemic however morning labs with resolution of hyperkalemia. Patient has been seen in consultation by nephrology and patient underwent HD yesterday.   #5 Ecoli UTI Urine cultures  consistent with Escherichia coli. Pending. Continue IV  Rocephin.  #6 hyperkalemia Patient had received an amp of bicarbonate, D50 as well as some Kayexalate 07/18/2015. Patient status post hemodialysis on 07/18/2016. Potassium of 3.7 today 07/19/2016.   #7 metabolic acidosis Likely secondary to acute renal failure. Patient status post hemodialysis yesterday with resolution of metabolic acidosis  #8 paroxysmal atrial fibrillation CHA2DS2VASC score = 4 Currently rate controlled on bystolic. Patient on IV heparin. Once no further procedures are to be done patient can be transitioned to her home dose of eliquis.  #9 history of DVT Patient has been off eliquis for 6 days prior to admission. VQ scan with low probability for PE. On IV heparin.  #10 obstructive sleep apnea C Pap daily at bedtime.  #11 depression anxiety No suicidal or homicidal ideations. Continue Ativan, Xanax. Zoloft on hold.  #12 hypertension Bystolic  [id number] hypoglycemia CBGs have ranged from 87-98. Patient currently nothing by mouth. Follow closely.  #14 acute on chronic diastolic heart failure Patient noted to have elevated BUN on examination. Some improvement with lower extremity edema as patient did receive some IV Lasix and HD yesterday. Cardiac enzymes negative 1. 2-D echo with a EF of 55-60% with no wall motion abnormalities. Patient with no significant response to IV diuretics. Patient s/p hemodialysis yesterday. Follow.  #15 morbid obesity/debility Patient states she's been bedbound for approximately 5-6 months. PT/OT.  #16 Intertriginous dermatitis Patient on daily Diflucan as well as interdry.  #17 left lower extremity circumferential wounds Patient has been assessed by the wound care nurse and continue current recommendations.   DVT prophylaxis: Heparin Code Status: Full Family Communication: Updated patient. No family at bedside. Disposition Plan: Home once clinically improved, acute renal failure  resolved and per nephrology.   Consultants:   Nephrology: Dr. Mercy Moore 07/17/2016  Procedures:   Right IJ catheter placed per pulmonary 07/17/2016  CT abdomen and pelvis 07/17/2016  Chest x-ray 07/17/2016  Renal ultrasound 07/17/2016  2-D echo 07/17/2016  V/Q scan 07/18/2016   Antimicrobials:   IV Rocephin 07/17/2016  IV doxycycline 07/17/2016   Subjective: Patient sleeping, easily arousable,  patient denies any abdominal pain. Patient denies any current chest pain. Patient states some improvement with her breathing.  Objective: Vitals:   07/19/16 0500 07/19/16 0600 07/19/16 0700 07/19/16 0800  BP: (!) 95/49 (!) 103/50 (!) 111/57   Pulse: 87 66 72   Resp: 11 11 11    Temp:    97.5 F (36.4 C)  TempSrc:    Axillary  SpO2: 100% 100% 98%   Weight:      Height:        Intake/Output Summary (Last 24 hours) at 07/19/16 0925 Last data filed at 07/19/16 0800  Gross per 24 hour  Intake          1171.17 ml  Output                0 ml  Net          1171.17 ml   Filed Weights   07/17/16 1200 07/18/16 0037  Weight: (!) 156 kg (344 lb) (!) 152 kg (335 lb)    Examination:  General exam: Morbidly obese. Sleeping easily arousable. Respiratory system: Bibasilar crackles and decreased BS in bases. Respiratory effort normal. Cardiovascular system: Irregularly irregular. S1 & S2 heard, RRR. No JVD, murmurs, rubs, gallops or clicks. No pedal edema. Gastrointestinal system: Abdomen is nondistended, soft and nontender. No organomegaly or masses felt. Normal bowel sounds heard. Central nervous system: Alert and oriented. No focal neurological deficits. Extremities:  Symmetric 5 x 5 power. Skin: No rashes, lesions or ulcers Psychiatry: Judgement and insight appear normal. Mood & affect appropriate.     Data Reviewed: I have personally reviewed following labs and imaging studies  CBC:  Recent Labs Lab 06/28/2016 2311 07/18/16 0954 07/19/16 0335  WBC 13.9* 11.2* 11.0*    NEUTROABS 11.7* 8.7*  --   HGB 10.1* 9.4* 8.4*  HCT 30.9* 29.0* 25.7*  MCV 97.5 97.3 96.3  PLT 211 210 0000000   Basic Metabolic Panel:  Recent Labs Lab 07/17/16 0037 07/17/16 1050 07/18/16 0954 07/19/16 0335  NA 135 134* 134* 134*  K 6.4* 6.0* 5.3* 3.7  CL 105 99* 103 100*  CO2 13* 14* 14* 23  GLUCOSE 67 77 96 90  BUN 190* 190* 191* 94*  CREATININE 19.66* 18.29* 19.29* 11.11*  CALCIUM 7.1* 6.9* 7.2* 7.2*  PHOS  --  8.3* 7.5*  --    GFR: Estimated Creatinine Clearance: 7.2 mL/min (by C-G formula based on SCr of 11.11 mg/dL (H)). Liver Function Tests:  Recent Labs Lab 07/17/16 0037 07/17/16 1050 07/18/16 0954 07/19/16 0335  AST 15  --   --  13*  ALT 5*  --   --  8*  ALKPHOS 100  --   --  95  BILITOT 0.9  --   --  0.6  PROT 7.1  --   --  6.7  ALBUMIN 1.9* 1.9* 1.6* 1.5*    Recent Labs Lab 07/17/16 0037 07/18/16 0954 07/19/16 0335  LIPASE 671* 174* 91*   No results for input(s): AMMONIA in the last 168 hours. Coagulation Profile:  Recent Labs Lab 07/17/16 0100  INR 2.27   Cardiac Enzymes:  Recent Labs Lab 07/17/16 1025  TROPONINI <0.03   BNP (last 3 results) No results for input(s): PROBNP in the last 8760 hours. HbA1C: No results for input(s): HGBA1C in the last 72 hours. CBG:  Recent Labs Lab 07/17/16 2335 07/18/16 0303 07/18/16 0741 07/19/16 0427 07/19/16 0805  GLUCAP 91 87 90 97 98   Lipid Profile:  Recent Labs  07/17/16 1050  CHOL 64  HDL 17*  LDLCALC 16  TRIG 155*  CHOLHDL 3.8   Thyroid Function Tests:  Recent Labs  07/17/16 1025  TSH 3.524   Anemia Panel: No results for input(s): VITAMINB12, FOLATE, FERRITIN, TIBC, IRON, RETICCTPCT in the last 72 hours. Sepsis Labs:  Recent Labs Lab 07/17/16 0047  LATICACIDVEN 1.75    Recent Results (from the past 240 hour(s))  Urine culture     Status: Abnormal   Collection Time: 07/17/16  3:49 AM  Result Value Ref Range Status   Specimen Description URINE, RANDOM  Final    Special Requests NONE  Final   Culture >=100,000 COLONIES/mL ESCHERICHIA COLI (A)  Final   Report Status 07/19/2016 FINAL  Final   Organism ID, Bacteria ESCHERICHIA COLI (A)  Final      Susceptibility   Escherichia coli - MIC*    AMPICILLIN 8 SENSITIVE Sensitive     CEFAZOLIN <=4 SENSITIVE Sensitive     CEFTRIAXONE <=1 SENSITIVE Sensitive     CIPROFLOXACIN <=0.25 SENSITIVE Sensitive     GENTAMICIN <=1 SENSITIVE Sensitive     IMIPENEM <=0.25 SENSITIVE Sensitive     NITROFURANTOIN <=16 SENSITIVE Sensitive     TRIMETH/SULFA <=20 SENSITIVE Sensitive     AMPICILLIN/SULBACTAM 4 SENSITIVE Sensitive     PIP/TAZO <=4 SENSITIVE Sensitive     Extended ESBL NEGATIVE Sensitive     * >=100,000 COLONIES/mL  ESCHERICHIA COLI  MRSA PCR Screening     Status: None   Collection Time: 07/17/16 12:31 PM  Result Value Ref Range Status   MRSA by PCR NEGATIVE NEGATIVE Final    Comment:        The GeneXpert MRSA Assay (FDA approved for NASAL specimens only), is one component of a comprehensive MRSA colonization surveillance program. It is not intended to diagnose MRSA infection nor to guide or monitor treatment for MRSA infections.          Radiology Studies: Nm Pulmonary Perf And Vent  Result Date: 07/18/2016 CLINICAL DATA:  Patient with DVT. chest pain and sob -chronic/ renal failure and CHF/ eval for PE/ limited imaging as patient is unable to be moved from bed due to her condition EXAM: NUCLEAR MEDICINE VENTILATION - PERFUSION LUNG SCAN TECHNIQUE: Ventilation images were obtained in multiple projections using inhaled aerosol Tc-11m DTPA. Perfusion images were obtained in multiple projections after intravenous injection of Tc-49m MAA. RADIOPHARMACEUTICALS:  31.8 mCi Technetium-11m DTPA aerosol inhalation and 3.7 mCi Technetium-62m MAA IV COMPARISON:  Chest radiograph, 07/17/2016. FINDINGS: Ventilation: No focal ventilation defect. Perfusion: On the projects since that were obtained, which are  RAO, anterior and LAO, there is no perfusion abnormality. IMPRESSION: Limited study.  No evidence of a pulmonary thromboembolism. Electronically Signed   By: Lajean Manes M.D.   On: 07/18/2016 12:52   Dg Chest Port 1 View  Result Date: 07/17/2016 CLINICAL DATA:  Status post hemodialysis catheter insertion. EXAM: PORTABLE CHEST 1 VIEW COMPARISON:  None. FINDINGS: 2221 hours. The cardio pericardial silhouette is enlarged. There is pulmonary vascular congestion without overt pulmonary edema. Left base collapse/ consolidation is stable. Right IJ central line tip projects at the level of the mid SVC. No evidence for right pneumothorax IMPRESSION: Right IJ central line tip in SVC level without evidence for right pneumothorax. Cardiomegaly with vascular congestion and persistent left base collapse/consolidation with effusion. Electronically Signed   By: Misty Stanley M.D.   On: 07/17/2016 22:49        Scheduled Meds: . ALPRAZolam  1 mg Oral BID  . aspirin EC  81 mg Oral Daily  . cefTRIAXone (ROCEPHIN)  IV  1 g Intravenous Q24H  . doxycycline (VIBRAMYCIN) IV  100 mg Intravenous Q12H  . feeding supplement (PRO-STAT SUGAR FREE 64)  30 mL Oral BID  . fluconazole  50 mg Oral Q24H  . gabapentin  300 mg Oral QHS  . nebivolol  2.5 mg Oral Daily  . nystatin   Topical BID  . pantoprazole  20 mg Oral Daily  . saccharomyces boulardii  250 mg Oral BID   Continuous Infusions: . dextrose 5 % and 0.45% NaCl Stopped (07/18/16 2030)  . heparin 1,250 Units/hr (07/19/16 0807)     LOS: 2 days    Time spent: 33 mins    Kenzy Campoverde, MD [facility] Pager [phone]  If 7PM-7AM, please contact night-coverage [email or web address] Password [email or web address] 07/19/2016, 9:25 AM

## 2016-07-19 NOTE — Progress Notes (Signed)
[place] for IV heparin Indication: Hx of DVT and A-fib on chronic eliquis non-compliant now with CP, SOB and possible PE  Allergies  Allergen Reactions  . Effexor [Venlafaxine Hydrochloride] Anxiety    Made her want to kill herself    Patient Measurements: TBW: 152 kg Heparin Dosing Weight: 91.54kg  Vital Signs: Temp: 96.7 F (35.9 C) (01/28 1153) Temp Source: Axillary (01/28 1153) BP: 111/57 (01/28 0700) Pulse Rate: 72 (01/28 0700)  Labs:  Recent Labs  07/07/2016 2311  07/17/16 0100 07/17/16 1025 07/17/16 1050 07/18/16 0954 07/18/16 2323 07/19/16 0335 07/19/16 1230  HGB 10.1*  --   --   --   --  9.4*  --  8.4*  --   HCT 30.9*  --   --   --   --  29.0*  --  25.7*  --   PLT 211  --   --   --   --  210  --  181  --   APTT  --   --  45*  --   --   --  123*  --  50*  LABPROT  --   --  25.4*  --   --   --   --   --   --   INR  --   --  2.27  --   --   --   --   --   --   HEPARINUNFRC  --   --  1.36*  --   --   --  0.80*  --  0.53  CREATININE  --   < >  --   --  18.29* 19.29*  --  11.11*  --   TROPONINI  --   --   --  <0.03  --   --   --   --   --   < > = values in this interval not displayed.  Estimated Creatinine Clearance: 7.2 mL/min (by C-G formula based on SCr of 11.11 mg/dL (H)).  Assessment: 28 yoF c/o generalized weakness x 1 week on chronic eliquis for hx of A-fib and DVT.  Patient states not taken for >6 days.  Heparin was restarted following placement of HD cath and transfer from [facility] to [facility]. Will stop checking aPTTs as apixaban should be cleared now that it has been a week since patient last states she took a dose.  -HL 0.53 therapeutic -Hgb 8.4, Plts 181, no signs of bleeding  Goal of Therapy:  Heparin level 0.3-0.7 units/ml Monitor platelets by anticoagulation protocol: Yes   Plan:  -Continue heparin to 1250 units/hr -Daily CBC/HL -Monitor S/Sx bleeding  Myer Peer Grayland Ormond), PharmD  PGY1 Pharmacy  Resident Pager: [phone] 07/19/2016 3:15 PM

## 2016-07-19 NOTE — Progress Notes (Signed)
[place] for IV heparin Indication: Hx of DVT and A-fib on chronic eliquis non-compliant now with CP, SOB and possible PE  Allergies  Allergen Reactions  . Effexor [Venlafaxine Hydrochloride] Anxiety    Made her want to kill herself    Patient Measurements: TBW: 152 kg Heparin Dosing Weight: 91.54kg  Vital Signs: Temp: 97 F (36.1 C) (01/28 0000) Temp Source: Oral (01/28 0000) BP: 118/55 (01/28 0000) Pulse Rate: 79 (01/28 0000)  Labs:  Recent Labs  06/28/2016 2311 07/17/16 0037 07/17/16 0100 07/17/16 1025 07/17/16 1050 07/18/16 0954 07/18/16 2323  HGB 10.1*  --   --   --   --  9.4*  --   HCT 30.9*  --   --   --   --  29.0*  --   PLT 211  --   --   --   --  210  --   APTT  --   --  45*  --   --   --   --   LABPROT  --   --  25.4*  --   --   --   --   INR  --   --  2.27  --   --   --   --   HEPARINUNFRC  --   --  1.36*  --   --   --  0.80*  CREATININE  --  19.66*  --   --  18.29* 19.29*  --   TROPONINI  --   --   --  <0.03  --   --   --     Estimated Creatinine Clearance: 4.1 mL/min (by C-G formula based on SCr of 19.29 mg/dL (H)).  Assessment: 45 yoF c/o generalized weakness x 1 week on chronic eliquis for hx of A-fib and DVT.  Patient states not taken for 6 days now with CP and SOB, R/O PE.  Heparin was started at [facility], but held last night to place HD catheter. Following placement, the patient was transferred to [facility] and heparin is now to be restarted.  Baseline HL=1.36, aPtt=45, from 0100 on 1/26 at [facility]  Due to poor renal function, apixaban is likely still influencing heparin level. Heparin level and aPTT elevated tonight at 0.8 units/mL and 123 seconds, respectively. No bleeding noted.   Goal of Therapy:  Goal aPTT 66-102 Heparin level 0.3-0.7 units/ml Monitor platelets by anticoagulation protocol: Yes   Plan:  -Decrease heparin to 1250 units/hr -8hr aPTT and HL -Daily CBC/HL/aPTT -Monitor S/Sx  bleeding  [name], PharmD, BCPS Clinical Pharmacist Pager: [phone] 07/19/2016 1:02 AM

## 2016-07-20 DIAGNOSIS — N19 Unspecified kidney failure: Secondary | ICD-10-CM

## 2016-07-20 LAB — GLUCOSE, CAPILLARY
GLUCOSE-CAPILLARY: 95 mg/dL (ref 65–99)
GLUCOSE-CAPILLARY: 71 mg/dL (ref 65–99)
Glucose-Capillary: 119 mg/dL — ABNORMAL HIGH (ref 65–99)
Glucose-Capillary: 65 mg/dL (ref 65–99)
Glucose-Capillary: 66 mg/dL (ref 65–99)
Glucose-Capillary: 74 mg/dL (ref 65–99)
Glucose-Capillary: 81 mg/dL (ref 65–99)

## 2016-07-20 LAB — CBC
HCT: 25.7 % — ABNORMAL LOW (ref 36.0–46.0)
Hemoglobin: 8.1 g/dL — ABNORMAL LOW (ref 12.0–15.0)
MCH: 31.3 pg (ref 26.0–34.0)
MCHC: 31.5 g/dL (ref 30.0–36.0)
MCV: 99.2 fL (ref 78.0–100.0)
PLATELETS: 163 10*3/uL (ref 150–400)
RBC: 2.59 MIL/uL — AB (ref 3.87–5.11)
RDW: 17.2 % — AB (ref 11.5–15.5)
WBC: 11.2 10*3/uL — AB (ref 4.0–10.5)

## 2016-07-20 LAB — RENAL FUNCTION PANEL
ALBUMIN: 1.4 g/dL — AB (ref 3.5–5.0)
Anion gap: 10 (ref 5–15)
BUN: 91 mg/dL — AB (ref 6–20)
CO2: 22 mmol/L (ref 22–32)
CREATININE: 10.36 mg/dL — AB (ref 0.44–1.00)
Calcium: 7.2 mg/dL — ABNORMAL LOW (ref 8.9–10.3)
Chloride: 102 mmol/L (ref 101–111)
GFR calc Af Amer: 4 mL/min — ABNORMAL LOW (ref >60)
GFR, EST NON AFRICAN AMERICAN: 3 mL/min — AB (ref >60)
GLUCOSE: 70 mg/dL (ref 65–99)
PHOSPHORUS: 6.8 mg/dL — AB (ref 2.5–4.6)
Potassium: 3.7 mmol/L (ref 3.5–5.1)
SODIUM: 134 mmol/L — AB (ref 135–145)

## 2016-07-20 LAB — LEGIONELLA PNEUMOPHILA SEROGP 1 UR AG: L. PNEUMOPHILA SEROGP 1 UR AG: NEGATIVE

## 2016-07-20 LAB — LIPASE, BLOOD: Lipase: 82 U/L — ABNORMAL HIGH (ref 11–51)

## 2016-07-20 LAB — HEPARIN LEVEL (UNFRACTIONATED): HEPARIN UNFRACTIONATED: 0.61 [IU]/mL (ref 0.30–0.70)

## 2016-07-20 MED ORDER — SODIUM CHLORIDE 0.9 % IV BOLUS (SEPSIS)
250.0000 mL | Freq: Once | INTRAVENOUS | Status: AC
  Administered 2016-07-20: 250 mL via INTRAVENOUS

## 2016-07-20 NOTE — Progress Notes (Signed)
Hypoglycemic Event  CBG: 66  Treatment: d50   Symptoms: none  Follow-up CBG: Time:0511 CBG Result:95  Possible Reasons for Event: npo  Comments/MD notified: A. Hugelmeyer    Jerome Otter, Janace Hoard

## 2016-07-20 NOTE — Progress Notes (Signed)
[facility] Progress Note    Assessment/ Plan:   1. Renal failure - dehydration from diarrheal illness/ diuretics +/- ATN.  Last creat here was 1.0 in 2016.  Pt is not long-term HD candidate. Getting short-term HD while awaiting to see if renal fxn recovers.  - Stop IVF --> she looks euvolemic.  - Plan on HD MWF  for 1-2 weeks and if no improvement will need to seriously consider withdrawal. She is certainly not a candidate for long term dialysis as it would not improve her QOL. 2. Uremia - improved, asterixis resolved, sp HD Sat 11/27. 3. HCAP - on IV abx, RLL infiltrate 4. Volume - doubt CHF clinically, CXR w vasc crowding 5. Chest pain - per primary, on IV hep empirically 6. Abd pain - lipase ^605, poss acute pancreatitis 7. UTI - urine cx+ (E.Coli Sens Rocephin). 8. 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 Debility/ bedbound x 4 yrs 10. Hyperkalemia - renal diet, HD 11. Chronic wound left LE 12. EOL - would address code status, she is full code now but was DNR on her last admit here in Sept 2017.     Subjective:   Denies dypsnea, cp/f/c/n/v. Denies thirst or lightheadedness.   Objective:   BP (!) 98/51   Pulse 70   Temp 97.4 F (36.3 C) (Oral)   Resp (!) 21   Ht 5\' 3"  (1.6 m)   Wt (!) 155 kg (341 lb 11.4 oz)   SpO2 98%   BMI 60.53 kg/m   Intake/Output Summary (Last 24 hours) at 07/20/16 1143 Last data filed at 07/20/16 0600  Gross per 24 hour  Intake           1227.5 ml  Output               40 ml  Net           1187.5 ml   Weight change:   Physical Exam: Morbidly obese, responds appropriately No jvd Chest clear ant/ lat RRR no mrg Abd obese, nontender, +bs Ext chronic skin chgs, no sig UE/ LE edema (very mild in the lower ext) Neuro no asterixis, resolved, +oriented   Imaging: Nm Pulmonary Perf And Vent  Result Date: 07/18/2016 CLINICAL DATA:  Patient with DVT. chest pain and sob -chronic/ renal failure and CHF/ eval for PE/ limited imaging  as patient is unable to be moved from bed due to her condition EXAM: NUCLEAR MEDICINE VENTILATION - PERFUSION LUNG SCAN TECHNIQUE: Ventilation images were obtained in multiple projections using inhaled aerosol Tc-60m DTPA. Perfusion images were obtained in multiple projections after intravenous injection of Tc-32m MAA. RADIOPHARMACEUTICALS:  31.8 mCi Technetium-84m DTPA aerosol inhalation and 3.7 mCi Technetium-75m MAA IV COMPARISON:  Chest radiograph, 07/17/2016. FINDINGS: Ventilation: No focal ventilation defect. Perfusion: On the projects since that were obtained, which are RAO, anterior and LAO, there is no perfusion abnormality. IMPRESSION: Limited study.  No evidence of a pulmonary thromboembolism. Electronically Signed   By: Lajean Manes M.D.   On: 07/18/2016 12:52    Labs: BMET  Recent Labs Lab 07/17/16 0037 07/17/16 1050 07/18/16 0954 07/19/16 0335 07/20/16 0430  NA 135 134* 134* 134* 134*  K 6.4* 6.0* 5.3* 3.7 3.7  CL 105 99* 103 100* 102  CO2 13* 14* 14* 23 22  GLUCOSE 67 77 96 90 70  BUN 190* 190* 191* 94* 91*  CREATININE 19.66* 18.29* 19.29* 11.11* 10.36*  CALCIUM 7.1* 6.9* 7.2* 7.2* 7.2*  PHOS  --  8.3* 7.5*  --  6.8*   CBC  Recent Labs Lab 07/12/2016 2311 07/18/16 0954 07/19/16 0335 07/20/16 0429  WBC 13.9* 11.2* 11.0* 11.2*  NEUTROABS 11.7* 8.7*  --   --   HGB 10.1* 9.4* 8.4* 8.1*  HCT 30.9* 29.0* 25.7* 25.7*  MCV 97.5 97.3 96.3 99.2  PLT 211 210 181 163    Medications:    . ALPRAZolam  1 mg Oral BID  . aspirin EC  81 mg Oral Daily  . cefTRIAXone (ROCEPHIN)  IV  1 g Intravenous Q24H  . doxycycline (VIBRAMYCIN) IV  100 mg Intravenous Q12H  . feeding supplement (PRO-STAT SUGAR FREE 64)  30 mL Oral BID  . fluconazole  50 mg Oral Q24H  . gabapentin  300 mg Oral QHS  . nebivolol  2.5 mg Oral Daily  . nystatin   Topical BID  . pantoprazole  20 mg Oral Daily  . saccharomyces boulardii  250 mg Oral BID      Otelia Santee, MD 07/20/2016, 11:43 AM

## 2016-07-20 NOTE — Progress Notes (Signed)
PROGRESS NOTE    [name]  [id number] DOB: November 26, 1950 DOA: 06/26/2016 PCP: Alvester Chou, NP   Brief Narrative:  Patient is a 66 year old female history of morbid obesity bedbound for several months, DVT on chronic anticoagulation who hasn't taken her anticoagulation approximately 6 days prior to admission, hypertension, depression anxiety, atrial fibrillation Thomas obstructive sleep apnea, diastolic heart failure presented to the ED with generalized weakness, chest pain, shortness of breath and abdominal pain. Patient noted to be in acute renal failure with a creatinine of 19.66, BUN of 190, potassium of 6.4, bicarbonate of 13. Patient also noted with significant hyperkalemia, probable community-acquired pneumonia, chest pain, probable UTI, and acute pancreatitis.   Assessment & Plan:   Principal Problem:   Chest pain Active Problems:   Acute renal failure (HCC)   PAF (paroxysmal atrial fibrillation) (HCC)   Metabolic acidosis   Uremia, acute   BMI 70 and over, adult ([place])   HTN (hypertension)   Obstructive sleep apnea on CPAP   CAP (community acquired pneumonia)   Hyperkalemia   Dermatitis fungal   Depression   Essential hypertension   Acute on chronic diastolic congestive heart failure (HCC)   Hypoglycemia   Generalized weakness   Pancreatitis   Abdominal pain   S/P hemodialysis catheter insertion (HCC)   E. coli UTI  #1 chest pain Questionable etiology. Likely multifactorial secondary to probable community-acquired pneumonia, volume overload secondary to acute renal failure, concern for possible PE as patient was off her anticoagulation for 6 days prior to admission with a history of DVT. Patient with clinical improvement. Initial set of cardiac enzymes negative. 2-D echo with EF of 55-60% with no wall motion abnormalities, no evidence of thrombus, small pericardial effusion identified circumferential to the heart. Urine pneumococcus antigen negative. Urine Legionella  antigen pending. Patient given a dose of IV Lasix 160 mg 1 yesterday however with no significant urine output. Continue empiric IV Rocephin and IV doxycycline for probable community-acquired pneumonia. V/Q scan negative for PE. Patient likely for hemodialysis today. Follow.  #2 probable community-acquired pneumonia Chest x-ray. Urine pneumococcus antigen negative. Urine Legionella antigen pending. Patient currently afebrile. Labs pending this morning. Continue empiric IV Rocephin and IV doxycycline. Follow.  #3 acute pancreatitis Clinical improvement. Patient noted to have a elevated lipase on admission of 671. CT abdomen and pelvis consistent with an acute pancreatitis in the tail of the pancreas, with bilateral pleural effusions with dependent atelectasis or pneumonia, calcified gallstone dependent in the gallbladder. LFTs were not elevated. Repeat lipase pending. Labs pending. Unable to place patient on IV fluids secondary to acute renal failure. Trial of clear liquids was attempted on 07/18/2016 however patient was noted to be nauseous with dry heaves and abdominal pain right afterwards. Symptomatic treatment. Follow.  #4 acute renal failure with uremia/hyperkalemia/metabolic acidosis Patient presented with acute renal failure, with a creatinine of 19.66 on admission, BUN of 190, hyperkalemic. Morning labs with improvement in renal function, metabolic acidosis, hyperkalemia after dialysis yesterday 07/18/2016. Reanl function improvoing with HD. Questionable etiology. Consent for a prerenal azotemia as patient had poor oral intake prior to admission versus ATN. Patient did receive an amp of bicarbonate as well as D10 at 50 mL an hour yesterday.  Patient is uremic on examination with asterixis, elevated BUN. Patient also noted to be hyperkalemic however morning labs with resolution of hyperkalemia. Patient has been seen in consultation by nephrology and patient underwent HD  #5 Ecoli UTI Urine  cultures consistent with Escherichia coli. Continue IV Rocephin.  #  6 hyperkalemia Patient had received an amp of bicarbonate, D50 as well as some Kayexalate 07/18/2015. Patient status post hemodialysis on 07/18/2016. Potassium of 3.7 today 07/19/2016.   #7 metabolic acidosis Likely secondary to acute renal failure. Patient status post hemodialysis yesterday with resolution of metabolic acidosis  #8 paroxysmal atrial fibrillation CHA2DS2VASC score = 4 Currently rate controlled on bystolic. Patient on IV heparin. Once no further procedures are to be done patient can be transitioned to her home dose of eliquis.  #9 history of DVT Patient has been off eliquis for 6 days prior to admission. VQ scan with low probability for PE. On IV heparin.  #10 obstructive sleep apnea C Pap daily at bedtime.  #11 depression anxiety No suicidal or homicidal ideations. Continue Ativan, Xanax. Zoloft on hold.  #12 hypertension Bystolic  [id number] hypoglycemia CBGs have ranged from 66-95. Place on clears. Follow closely.  #14 acute on chronic diastolic heart failure Patient noted to have elevated BUN on examination. Some improvement with lower extremity edema as patient did receive some IV Lasix and HD yesterday. Cardiac enzymes negative 1. 2-D echo with a EF of 55-60% with no wall motion abnormalities. Patient with no significant response to IV diuretics. Patient s/p hemodialysis yesterday. Follow.  #15 morbid obesity/debility Patient states she's been bedbound for approximately 5-6 months. PT/OT.  #16 Intertriginous dermatitis Patient on daily Diflucan as well as interdry.  #17 left lower extremity circumferential wounds Patient has been assessed by the wound care nurse and continue current recommendations.   DVT prophylaxis: Heparin Code Status: Full Family Communication: Updated patient. No family at bedside. Disposition Plan: Home once clinically improved, acute renal failure resolved and per  nephrology.   Consultants:   Nephrology: Dr. Mercy Moore 07/17/2016  Procedures:   Right IJ catheter placed per pulmonary 07/17/2016  CT abdomen and pelvis 07/17/2016  Chest x-ray 07/17/2016  Renal ultrasound 07/17/2016  2-D echo 07/17/2016  V/Q scan 07/18/2016   Antimicrobials:   IV Rocephin 07/17/2016  IV doxycycline 07/17/2016   Subjective: Patient sleeping, easily arousable,  patient denies any abdominal pain. Patient denies any current chest pain. Patient states some improvement with her breathing.  Objective: Vitals:   07/20/16 0500 07/20/16 0600 07/20/16 0827 07/20/16 0941  BP: 107/62 (!) 99/53 (!) 94/49 (!) 98/51  Pulse: 81 83 69 70  Resp: 10 (!) 25 18 (!) 21  Temp:   97.4 F (36.3 C)   TempSrc:   Oral   SpO2: 100% 99% 96% 98%  Weight:      Height:        Intake/Output Summary (Last 24 hours) at 07/20/16 0955 Last data filed at 07/20/16 0600  Gross per 24 hour  Intake           1502.5 ml  Output               40 ml  Net           1462.5 ml   Filed Weights   07/17/16 1200 07/18/16 0037  Weight: (!) 156 kg (344 lb) (!) 152 kg (335 lb)    Examination:  General exam: Morbidly obese. Sleeping easily arousable. Respiratory system: Bibasilar crackles and decreased BS in bases. Respiratory effort normal. Cardiovascular system: Irregularly irregular. S1 & S2 heard, RRR. No JVD, murmurs, rubs, gallops or clicks. No pedal edema. Gastrointestinal system: Abdomen is nondistended, soft and nontender. No organomegaly or masses felt. Normal bowel sounds heard. Central nervous system: Drowsy. No focal neurological deficits. Extremities: Symmetric 5 x  5 power. Skin: No rashes, lesions or ulcers Psychiatry: Judgement and insight appear normal. Mood & affect appropriate.     Data Reviewed: I have personally reviewed following labs and imaging studies  CBC:  Recent Labs Lab 06/23/2016 2311 07/18/16 0954 07/19/16 0335 07/20/16 0429  WBC 13.9* 11.2* 11.0*  11.2*  NEUTROABS 11.7* 8.7*  --   --   HGB 10.1* 9.4* 8.4* 8.1*  HCT 30.9* 29.0* 25.7* 25.7*  MCV 97.5 97.3 96.3 99.2  PLT 211 210 181 [id number]   Basic Metabolic Panel:  Recent Labs Lab 07/17/16 0037 07/17/16 1050 07/18/16 0954 07/19/16 0335 07/20/16 0430  NA 135 134* 134* 134* 134*  K 6.4* 6.0* 5.3* 3.7 3.7  CL 105 99* 103 100* 102  CO2 13* 14* 14* 23 22  GLUCOSE 67 77 96 90 70  BUN 190* 190* 191* 94* 91*  CREATININE 19.66* 18.29* 19.29* 11.11* 10.36*  CALCIUM 7.1* 6.9* 7.2* 7.2* 7.2*  PHOS  --  8.3* 7.5*  --  6.8*   GFR: Estimated Creatinine Clearance: 7.7 mL/min (by C-G formula based on SCr of 10.36 mg/dL (H)). Liver Function Tests:  Recent Labs Lab 07/17/16 0037 07/17/16 1050 07/18/16 0954 07/19/16 0335 07/20/16 0430  AST 15  --   --  13*  --   ALT 5*  --   --  8*  --   ALKPHOS 100  --   --  95  --   BILITOT 0.9  --   --  0.6  --   PROT 7.1  --   --  6.7  --   ALBUMIN 1.9* 1.9* 1.6* 1.5* 1.4*    Recent Labs Lab 07/17/16 0037 07/18/16 0954 07/19/16 0335 07/20/16 0430  LIPASE 671* 174* 91* 82*   No results for input(s): AMMONIA in the last 168 hours. Coagulation Profile:  Recent Labs Lab 07/17/16 0100  INR 2.27   Cardiac Enzymes:  Recent Labs Lab 07/17/16 1025  TROPONINI <0.03   BNP (last 3 results) No results for input(s): PROBNP in the last 8760 hours. HbA1C: No results for input(s): HGBA1C in the last 72 hours. CBG:  Recent Labs Lab 07/19/16 1928 07/20/16 0024 07/20/16 0409 07/20/16 0511 07/20/16 0823  GLUCAP 86 81 66 95 65   Lipid Profile:  Recent Labs  07/17/16 1050  CHOL 64  HDL 17*  LDLCALC 16  TRIG 155*  CHOLHDL 3.8   Thyroid Function Tests:  Recent Labs  07/17/16 1025  TSH 3.524   Anemia Panel: No results for input(s): VITAMINB12, FOLATE, FERRITIN, TIBC, IRON, RETICCTPCT in the last 72 hours. Sepsis Labs:  Recent Labs Lab 07/17/16 0047  LATICACIDVEN 1.75    Recent Results (from the past 240 hour(s))    Urine culture     Status: Abnormal   Collection Time: 07/17/16  3:49 AM  Result Value Ref Range Status   Specimen Description URINE, RANDOM  Final   Special Requests NONE  Final   Culture >=100,000 COLONIES/mL ESCHERICHIA COLI (A)  Final   Report Status 07/19/2016 FINAL  Final   Organism ID, Bacteria ESCHERICHIA COLI (A)  Final      Susceptibility   Escherichia coli - MIC*    AMPICILLIN 8 SENSITIVE Sensitive     CEFAZOLIN <=4 SENSITIVE Sensitive     CEFTRIAXONE <=1 SENSITIVE Sensitive     CIPROFLOXACIN <=0.25 SENSITIVE Sensitive     GENTAMICIN <=1 SENSITIVE Sensitive     IMIPENEM <=0.25 SENSITIVE Sensitive     NITROFURANTOIN <=16 SENSITIVE Sensitive  TRIMETH/SULFA <=20 SENSITIVE Sensitive     AMPICILLIN/SULBACTAM 4 SENSITIVE Sensitive     PIP/TAZO <=4 SENSITIVE Sensitive     Extended ESBL NEGATIVE Sensitive     * >=100,000 COLONIES/mL ESCHERICHIA COLI  MRSA PCR Screening     Status: None   Collection Time: 07/17/16 12:31 PM  Result Value Ref Range Status   MRSA by PCR NEGATIVE NEGATIVE Final    Comment:        The GeneXpert MRSA Assay (FDA approved for NASAL specimens only), is one component of a comprehensive MRSA colonization surveillance program. It is not intended to diagnose MRSA infection nor to guide or monitor treatment for MRSA infections.          Radiology Studies: Nm Pulmonary Perf And Vent  Result Date: 07/18/2016 CLINICAL DATA:  Patient with DVT. chest pain and sob -chronic/ renal failure and CHF/ eval for PE/ limited imaging as patient is unable to be moved from bed due to her condition EXAM: NUCLEAR MEDICINE VENTILATION - PERFUSION LUNG SCAN TECHNIQUE: Ventilation images were obtained in multiple projections using inhaled aerosol Tc-70m DTPA. Perfusion images were obtained in multiple projections after intravenous injection of Tc-23m MAA. RADIOPHARMACEUTICALS:  31.8 mCi Technetium-67m DTPA aerosol inhalation and 3.7 mCi Technetium-72m MAA IV  COMPARISON:  Chest radiograph, 07/17/2016. FINDINGS: Ventilation: No focal ventilation defect. Perfusion: On the projects since that were obtained, which are RAO, anterior and LAO, there is no perfusion abnormality. IMPRESSION: Limited study.  No evidence of a pulmonary thromboembolism. Electronically Signed   By: Lajean Manes M.D.   On: 07/18/2016 12:52        Scheduled Meds: . ALPRAZolam  1 mg Oral BID  . aspirin EC  81 mg Oral Daily  . cefTRIAXone (ROCEPHIN)  IV  1 g Intravenous Q24H  . doxycycline (VIBRAMYCIN) IV  100 mg Intravenous Q12H  . feeding supplement (PRO-STAT SUGAR FREE 64)  30 mL Oral BID  . fluconazole  50 mg Oral Q24H  . gabapentin  300 mg Oral QHS  . nebivolol  2.5 mg Oral Daily  . nystatin   Topical BID  . pantoprazole  20 mg Oral Daily  . saccharomyces boulardii  250 mg Oral BID   Continuous Infusions: . dextrose 5 % and 0.45% NaCl 60 mL/hr at 07/20/16 0600  . heparin 1,250 Units/hr (07/20/16 0418)     LOS: 3 days    Time spent: 22 mins    Patrisha Hausmann, MD [facility] Pager [phone]  If 7PM-7AM, please contact night-coverage [email or web address] Password [email or web address] 07/20/2016, 9:55 AM

## 2016-07-20 NOTE — Progress Notes (Signed)
[place] for IV heparin Indication: Hx of DVT and A-fib on chronic eliquis non-compliant now with CP, SOB and possible PE  Allergies  Allergen Reactions  . Effexor [Venlafaxine Hydrochloride] Anxiety    Made her want to kill herself    Patient Measurements: TBW: 152 kg Heparin Dosing Weight: 91.54kg  Vital Signs: Temp: 97.2 F (36.2 C) (01/29 0417) Temp Source: Oral (01/29 0417) BP: 99/53 (01/29 0600) Pulse Rate: 83 (01/29 0600)  Assessment: 38 yoF c/o generalized weakness x 1 week on PTA apixaban for Afib and history of DVT. Now on heparin gtt with labs correlating. Last heparin level was therapeutic at 0.61. Hgb low but stable at 8.1, plts wnl. No s/s of bleed.  Goal of Therapy:  Heparin level 0.3-0.7 units/ml Monitor platelets by anticoagulation protocol: Yes   Plan:  Continue heparin gtt at 1,250 units/hr Monitor daily heparin level, CBC, s/s of bleed  Elenor Quinones, PharmD, [facility] Clinical Pharmacist Pager [phone] 07/20/2016 8:09 AM

## 2016-07-20 NOTE — Progress Notes (Signed)
Pt was transferred from 4E via Hemo. Pt is asleep and placed on Telemetry. Pt has D51/2NS @60 . Pt has a HD Cath with a pigtail. She has just come from a dialysis treatment.

## 2016-07-21 ENCOUNTER — Inpatient Hospital Stay (HOSPITAL_COMMUNITY): Payer: Medicare Other

## 2016-07-21 DIAGNOSIS — R109 Unspecified abdominal pain: Secondary | ICD-10-CM

## 2016-07-21 DIAGNOSIS — R0782 Intercostal pain: Secondary | ICD-10-CM

## 2016-07-21 DIAGNOSIS — G9340 Encephalopathy, unspecified: Secondary | ICD-10-CM

## 2016-07-21 DIAGNOSIS — J9601 Acute respiratory failure with hypoxia: Secondary | ICD-10-CM

## 2016-07-21 DIAGNOSIS — I5033 Acute on chronic diastolic (congestive) heart failure: Secondary | ICD-10-CM

## 2016-07-21 LAB — CBC
HEMATOCRIT: 26.1 % — AB (ref 36.0–46.0)
Hemoglobin: 8 g/dL — ABNORMAL LOW (ref 12.0–15.0)
MCH: 30.5 pg (ref 26.0–34.0)
MCHC: 30.7 g/dL (ref 30.0–36.0)
MCV: 99.6 fL (ref 78.0–100.0)
Platelets: 125 10*3/uL — ABNORMAL LOW (ref 150–400)
RBC: 2.62 MIL/uL — AB (ref 3.87–5.11)
RDW: 16.8 % — ABNORMAL HIGH (ref 11.5–15.5)
WBC: 11.5 10*3/uL — AB (ref 4.0–10.5)

## 2016-07-21 LAB — BLOOD GAS, ARTERIAL
ACID-BASE DEFICIT: 3.6 mmol/L — AB (ref 0.0–2.0)
BICARBONATE: 23.7 mmol/L (ref 20.0–28.0)
DRAWN BY: 313061
Delivery systems: POSITIVE
Expiratory PAP: 6
Inspiratory PAP: 10
Mode: POSITIVE
O2 Content: 6 L/min
O2 SAT: 90.5 %
PCO2 ART: 66.6 mmHg — AB (ref 32.0–48.0)
PH ART: 7.177 — AB (ref 7.350–7.450)
PO2 ART: 67.3 mmHg — AB (ref 83.0–108.0)
Patient temperature: 98.6

## 2016-07-21 LAB — POCT I-STAT 3, ART BLOOD GAS (G3+)
ACID-BASE DEFICIT: 4 mmol/L — AB (ref 0.0–2.0)
Bicarbonate: 21.7 mmol/L (ref 20.0–28.0)
O2 SAT: 100 %
PCO2 ART: 38.5 mmHg (ref 32.0–48.0)
PH ART: 7.355 (ref 7.350–7.450)
TCO2: 23 mmol/L (ref 0–100)
pO2, Arterial: 318 mmHg — ABNORMAL HIGH (ref 83.0–108.0)

## 2016-07-21 LAB — RENAL FUNCTION PANEL
Albumin: 1.5 g/dL — ABNORMAL LOW (ref 3.5–5.0)
Anion gap: 10 (ref 5–15)
BUN: 65 mg/dL — AB (ref 6–20)
CHLORIDE: 97 mmol/L — AB (ref 101–111)
CO2: 24 mmol/L (ref 22–32)
CREATININE: 7.99 mg/dL — AB (ref 0.44–1.00)
Calcium: 8 mg/dL — ABNORMAL LOW (ref 8.9–10.3)
GFR calc Af Amer: 5 mL/min — ABNORMAL LOW (ref >60)
GFR calc non Af Amer: 5 mL/min — ABNORMAL LOW (ref >60)
Glucose, Bld: 83 mg/dL (ref 65–99)
Phosphorus: 6.2 mg/dL — ABNORMAL HIGH (ref 2.5–4.6)
Potassium: 3.9 mmol/L (ref 3.5–5.1)
Sodium: 131 mmol/L — ABNORMAL LOW (ref 135–145)

## 2016-07-21 LAB — CBC WITH DIFFERENTIAL/PLATELET
BASOS ABS: 0 10*3/uL (ref 0.0–0.1)
BASOS PCT: 0 %
EOS ABS: 0.4 10*3/uL (ref 0.0–0.7)
Eosinophils Relative: 4 %
HEMATOCRIT: 26.3 % — AB (ref 36.0–46.0)
HEMOGLOBIN: 8.4 g/dL — AB (ref 12.0–15.0)
Lymphocytes Relative: 14 %
Lymphs Abs: 1.3 10*3/uL (ref 0.7–4.0)
MCH: 31.8 pg (ref 26.0–34.0)
MCHC: 31.9 g/dL (ref 30.0–36.0)
MCV: 99.6 fL (ref 78.0–100.0)
MONO ABS: 0.6 10*3/uL (ref 0.1–1.0)
MONOS PCT: 6 %
NEUTROS ABS: 7 10*3/uL (ref 1.7–7.7)
NEUTROS PCT: 75 %
Platelets: 115 10*3/uL — ABNORMAL LOW (ref 150–400)
RBC: 2.64 MIL/uL — ABNORMAL LOW (ref 3.87–5.11)
RDW: 17 % — AB (ref 11.5–15.5)
WBC: 9.3 10*3/uL (ref 4.0–10.5)

## 2016-07-21 LAB — AMMONIA: AMMONIA: 21 umol/L (ref 9–35)

## 2016-07-21 LAB — MRSA PCR SCREENING: MRSA by PCR: NEGATIVE

## 2016-07-21 LAB — LACTIC ACID, PLASMA: LACTIC ACID, VENOUS: 1.6 mmol/L (ref 0.5–1.9)

## 2016-07-21 LAB — GLUCOSE, CAPILLARY
GLUCOSE-CAPILLARY: 78 mg/dL (ref 65–99)
GLUCOSE-CAPILLARY: 78 mg/dL (ref 65–99)
GLUCOSE-CAPILLARY: 79 mg/dL (ref 65–99)
GLUCOSE-CAPILLARY: 92 mg/dL (ref 65–99)
Glucose-Capillary: 76 mg/dL (ref 65–99)
Glucose-Capillary: 76 mg/dL (ref 65–99)
Glucose-Capillary: 85 mg/dL (ref 65–99)
Glucose-Capillary: 66 mg/dL (ref 65–99)

## 2016-07-21 LAB — LIPASE, BLOOD: Lipase: 86 U/L — ABNORMAL HIGH (ref 11–51)

## 2016-07-21 LAB — CORTISOL: Cortisol, Plasma: 16.4 ug/dL

## 2016-07-21 LAB — TROPONIN I: Troponin I: <0.03 ng/mL (ref <0.03)

## 2016-07-21 LAB — HEPARIN LEVEL (UNFRACTIONATED): Heparin Unfractionated: 0.49 IU/mL (ref 0.30–0.70)

## 2016-07-21 MED ORDER — SODIUM CHLORIDE 0.9 % IV SOLN
100.0000 mL | INTRAVENOUS | Status: DC | PRN
Start: 2016-07-21 — End: 2016-07-25

## 2016-07-21 MED ORDER — SODIUM CHLORIDE 0.9 % IV SOLN: 100.0000 mL | INTRAVENOUS | Status: DC | PRN

## 2016-07-21 MED ORDER — FENTANYL CITRATE (PF) 100 MCG/2ML IJ SOLN
INTRAMUSCULAR | Status: AC
  Administered 2016-07-21: 100 ug via INTRAVENOUS
  Filled 2016-07-21: qty 2

## 2016-07-21 MED ORDER — ALTEPLASE 2 MG IJ SOLR: 2.0000 mg | Freq: Once | INTRAMUSCULAR | Status: DC | PRN

## 2016-07-21 MED ORDER — CHLORHEXIDINE GLUCONATE 0.12% ORAL RINSE (MEDLINE KIT)
15.0000 mL | Freq: Two times a day (BID) | OROMUCOSAL | Status: DC
  Administered 2016-07-21 – 2016-07-25 (×9): 15 mL via OROMUCOSAL

## 2016-07-21 MED ORDER — LIDOCAINE HCL (PF) 1 % IJ SOLN: 5.0000 mL | INTRAMUSCULAR | Status: DC | PRN

## 2016-07-21 MED ORDER — FENTANYL BOLUS VIA INFUSION
25.0000 ug | INTRAVENOUS | Status: DC | PRN
  Administered 2016-07-21 – 2016-07-23 (×3): 25 ug via INTRAVENOUS
  Filled 2016-07-21: qty 25

## 2016-07-21 MED ORDER — MIDAZOLAM HCL 2 MG/2ML IJ SOLN
INTRAMUSCULAR | Status: AC
  Administered 2016-07-21: 2 mg via INTRAVENOUS
  Filled 2016-07-21: qty 2

## 2016-07-21 MED ORDER — HEPARIN SODIUM (PORCINE) 1000 UNIT/ML DIALYSIS: 2000.0000 [IU] | INTRAMUSCULAR | Status: DC | PRN

## 2016-07-21 MED ORDER — PENTAFLUOROPROP-TETRAFLUOROETH EX AERO: 1.0000 "application " | INHALATION_SPRAY | CUTANEOUS | Status: DC | PRN

## 2016-07-21 MED ORDER — ALBUMIN HUMAN 25 % IV SOLN
25.0000 g | Freq: Once | INTRAVENOUS | Status: DC
  Filled 2016-07-21: qty 100

## 2016-07-21 MED ORDER — NOREPINEPHRINE BITARTRATE 1 MG/ML IV SOLN
2.0000 ug/min | INTRAVENOUS | Status: DC
  Administered 2016-07-21: 10 ug/min via INTRAVENOUS
  Administered 2016-07-22: 22 ug/min via INTRAVENOUS
  Administered 2016-07-22: 16 ug/min via INTRAVENOUS
  Administered 2016-07-22: 10 ug/min via INTRAVENOUS
  Administered 2016-07-22: 28 ug/min via INTRAVENOUS
  Administered 2016-07-23: 50 ug/min via INTRAVENOUS
  Administered 2016-07-23: 22 ug/min via INTRAVENOUS
  Administered 2016-07-23: 30 ug/min via INTRAVENOUS
  Administered 2016-07-23: 22 ug/min via INTRAVENOUS
  Administered 2016-07-23: 26 ug/min via INTRAVENOUS
  Administered 2016-07-23: 22 ug/min via INTRAVENOUS
  Administered 2016-07-23: 38 ug/min via INTRAVENOUS
  Administered 2016-07-23: 50 ug/min via INTRAVENOUS
  Filled 2016-07-21 (×15): qty 4

## 2016-07-21 MED ORDER — SODIUM CHLORIDE 0.9 % IV BOLUS (SEPSIS)
1000.0000 mL | Freq: Once | INTRAVENOUS | Status: AC
  Administered 2016-07-21: 1000 mL via INTRAVENOUS

## 2016-07-21 MED ORDER — LIDOCAINE-PRILOCAINE 2.5-2.5 % EX CREA: 1.0000 "application " | TOPICAL_CREAM | CUTANEOUS | Status: DC | PRN

## 2016-07-21 MED ORDER — ETOMIDATE 2 MG/ML IV SOLN
20.0000 mg | Freq: Once | INTRAVENOUS | Status: AC
  Administered 2016-07-21: 20 mg via INTRAVENOUS

## 2016-07-21 MED ORDER — PANTOPRAZOLE SODIUM 40 MG IV SOLR
40.0000 mg | INTRAVENOUS | Status: DC
  Administered 2016-07-21 – 2016-07-24 (×4): 40 mg via INTRAVENOUS
  Filled 2016-07-21 (×4): qty 40

## 2016-07-21 MED ORDER — MIDAZOLAM HCL 2 MG/2ML IJ SOLN
1.0000 mg | INTRAMUSCULAR | Status: DC | PRN
  Administered 2016-07-25: 1 mg via INTRAVENOUS
  Filled 2016-07-21: qty 2

## 2016-07-21 MED ORDER — HEPARIN SODIUM (PORCINE) 1000 UNIT/ML DIALYSIS: 4000.0000 [IU] | INTRAMUSCULAR | Status: DC | PRN

## 2016-07-21 MED ORDER — ORAL CARE MOUTH RINSE
15.0000 mL | OROMUCOSAL | Status: DC
  Administered 2016-07-21 – 2016-07-25 (×38): 15 mL via OROMUCOSAL

## 2016-07-21 MED ORDER — SODIUM CHLORIDE 0.9% FLUSH
10.0000 mL | INTRAVENOUS | Status: DC | PRN
  Administered 2016-07-21: 10 mL
  Filled 2016-07-21: qty 40

## 2016-07-21 MED ORDER — HEPARIN SODIUM (PORCINE) 1000 UNIT/ML DIALYSIS: 1000.0000 [IU] | INTRAMUSCULAR | Status: DC | PRN

## 2016-07-21 MED ORDER — HEPARIN SODIUM (PORCINE) 1000 UNIT/ML DIALYSIS
5000.0000 [IU] | INTRAMUSCULAR | Status: DC | PRN
  Filled 2016-07-21: qty 5

## 2016-07-21 MED ORDER — MIDAZOLAM HCL 2 MG/2ML IJ SOLN
1.0000 mg | INTRAMUSCULAR | Status: DC | PRN
  Administered 2016-07-21: 1 mg via INTRAVENOUS
  Filled 2016-07-21 (×2): qty 2

## 2016-07-21 MED ORDER — FENTANYL CITRATE (PF) 100 MCG/2ML IJ SOLN
50.0000 ug | Freq: Once | INTRAMUSCULAR | Status: AC
  Administered 2016-07-21: 50 ug via INTRAVENOUS

## 2016-07-21 MED ORDER — SODIUM CHLORIDE 0.9 % IV SOLN
25.0000 ug/h | INTRAVENOUS | Status: DC
  Administered 2016-07-21: 100 ug/h via INTRAVENOUS
  Administered 2016-07-23: 25 ug/h via INTRAVENOUS
  Administered 2016-07-24: 100 ug/h via INTRAVENOUS
  Administered 2016-07-25: 400 ug/h via INTRAVENOUS
  Administered 2016-07-25: 150 ug/h via INTRAVENOUS
  Filled 2016-07-21 (×5): qty 50

## 2016-07-21 MED ORDER — FLUCONAZOLE IN SODIUM CHLORIDE 100-0.9 MG/50ML-% IV SOLN
50.0000 mg | INTRAVENOUS | Status: DC
  Administered 2016-07-21 – 2016-07-24 (×4): 50 mg via INTRAVENOUS
  Filled 2016-07-21 (×5): qty 25

## 2016-07-21 MED ORDER — FENTANYL CITRATE (PF) 100 MCG/2ML IJ SOLN
100.0000 ug | Freq: Once | INTRAMUSCULAR | Status: AC
  Administered 2016-07-21: 100 ug via INTRAVENOUS

## 2016-07-21 MED ORDER — FENTANYL CITRATE (PF) 100 MCG/2ML IJ SOLN: 50.0000 ug | INTRAMUSCULAR | Status: DC | PRN

## 2016-07-21 MED ORDER — DEXTROSE 10 % IV SOLN
INTRAVENOUS | Status: DC
  Administered 2016-07-21: 22:00:00 via INTRAVENOUS
  Administered 2016-07-22: 75 mL/h via INTRAVENOUS
  Administered 2016-07-23: via INTRAVENOUS
  Administered 2016-07-23: 75 mL/h via INTRAVENOUS
  Administered 2016-07-24 (×2): via INTRAVENOUS

## 2016-07-21 MED ORDER — MIDAZOLAM HCL 2 MG/2ML IJ SOLN
2.0000 mg | Freq: Once | INTRAMUSCULAR | Status: AC
  Administered 2016-07-21: 2 mg via INTRAVENOUS

## 2016-07-21 MED ORDER — METOPROLOL TARTRATE 5 MG/5ML IV SOLN: 2.5000 mg | Freq: Three times a day (TID) | INTRAVENOUS | Status: DC

## 2016-07-21 NOTE — Progress Notes (Signed)
eLink Physician-Brief Progress Note Patient Name: [name] DOB: March 21, 1951 MRN: [id number]   Date of Service  07/21/2016  HPI/Events of Note  Acute drop in BP  eICU Interventions  1 L saline bolus and start levephed infusion     Intervention Category Major Interventions: Hypotension - evaluation and management  Flora Lipps 07/21/2016, 7:06 PM

## 2016-07-21 NOTE — Progress Notes (Signed)
O2 concentration wean per ABG values.

## 2016-07-21 NOTE — Procedures (Signed)
Intubation Procedure Note [name] [id number] 09/26/1950  Procedure: Intubation Indications: Respiratory insufficiency  Procedure Details Consent: Unable to obtain consent because of emergent medical necessity. Time Out: Verified patient identification, verified procedure, site/side was marked, verified correct patient position, special equipment/implants available, medications/allergies/relevent history reviewed, required imaging and test results available.  Performed  Maximum sterile technique was used including gloves, gown, hand hygiene and mask.  MAC and 3    Evaluation Hemodynamic Status: BP stable throughout; O2 sats: stable throughout Patient's Current Condition: stable Complications: No apparent complications Patient did tolerate procedure well. Chest X-ray ordered to verify placement.  CXR: pending.   [name] 07/21/2016  [place]  [name]. Deborah Mould, MD, [employer] Pgr: [facility]'

## 2016-07-21 NOTE — Progress Notes (Signed)
eLink Physician-Brief Progress Note Patient Name: [name] DOB: 09-Aug-1950 MRN: [id number]   Date of Service  07/21/2016  HPI/Events of Note  Discussion with Larkin Ina the Son of patient, he has agreed and consented to DNR status  eICU Interventions  1.place DNR status orders 2.continue medical care as provided     Intervention Category Evaluation Type: Other  Flora Lipps 07/21/2016, 5:23 PM

## 2016-07-21 NOTE — Progress Notes (Signed)
Pts Oxygen level is 87 on CPAP. Nurse increased O2 to 4L. Pts upper extremity is cold. Pt is still unresponsive and respiratory is in room. RR at bedside. ABG is to be done. Will monitor.

## 2016-07-21 NOTE — Consult Note (Addendum)
PULMONARY / CRITICAL CARE MEDICINE   Name: [name] MRN: [id number] DOB: 07/09/1950    ADMISSION DATE:  06/26/2016 CONSULTATION DATE:  07/21/2016  REFERRING MD:  Malachy Moan. Grandville Silos, MD Internal Medicine  CHIEF COMPLAINT:  Acute Respiratory Failure  HISTORY OF PRESENT ILLNESS:   Patient is a 66 year old female history of morbid obesity bedbound for several months, DVT on chronic anticoagulation who hasn't taken her anticoagulation approximately 6 days prior to admission, hypertension, depression, anxiety, atrial fibrillation , obstructive sleep apnea, diastolic heart failure presented to the ED with generalized weakness, chest pain, shortness of breath and abdominal pain. Patient noted to be in acute renal failure with a creatinine of 19.66, BUN of 190, potassium of 6.4, bicarbonate of 13. Patient also noted with significant hyperkalemia, probable community-acquired pneumonia, chest pain, probable UTI, and acute pancreatitis, and metabolic acidosis. She was emergently dialyzed 1/27/and 1/28 with some improvement. She was noted to be less alert the morning of 07/21/2016. Of note she does get several sedating medications. ABG indicated ph of 7.177, CO2 of 66 and PO2 of 67. CCM was emergently consulted for stat intubation with transfer of care as pt. Was transferred to ICU on life support.  Active Problem List: -Renal Failure>> Not candidate for long term HD.Plan on HD MWF  for 1-2 weeks and if no improvement will need to seriously consider withdrawal. -remia -HCAP: RLL infiltrate - Chest Pain: was on Heparin empirically - Abd. Pain>> Lipase elevated( > 605), possible acute pancreatitis - UTI - urine cx+ (E.Coli Sens Rocephin). - Hx DVT - was on Eliquis at home - Debility/ bedbound x 4 yrs - Hyperkalemia - renal diet, HD - Chronic wound left LE - Needs EOL, Goals of care conversation      PAST MEDICAL HISTORY :  She  has a past medical history of Anxiety; Arthritis; Breast cancer ([place])  (1988); Calcification of right breast; Cellulitis; Chronic bronchitis ([place]); Depression; Dysrhythmia; Exertional shortness of breath; Hypertension; Lymphedema of arm (2008, 2012); Obesities, morbid ([place]); Obstructive sleep apnea on CPAP (06/04/2012); PAF (paroxysmal atrial fibrillation) ([place]) (06/04/2012); Pneumonia; Right leg DVT ([place]) (04/06/2013); and Stasis dermatitis.  PAST SURGICAL HISTORY: She  has a past surgical history that includes Abdominal hysterectomy (1991); Carpal tunnel release (Right, 1995); Axillary lymph node dissection (Right, 1988); Wound debridement (09/03/2011); Breast lumpectomy (Right, 1988); Breast surgery (Right, 10/2011); and Cataract extraction w/ intraocular lens implant (Right, ~ 2006).  Allergies  Allergen Reactions  . Effexor [Venlafaxine Hydrochloride] Anxiety    Made her want to kill herself    No current facility-administered medications on file prior to encounter.    Current Outpatient Prescriptions on File Prior to Encounter  Medication Sig  . Amino Acids-Protein Hydrolys (FEEDING SUPPLEMENT, PRO-STAT SUGAR FREE 64,) LIQD Take 30 mLs by mouth 2 (two) times daily.  . diphenhydrAMINE (BENADRYL) 25 mg capsule Take 1 capsule (25 mg total) by mouth every 6 (six) hours as needed for allergies.  . furosemide (LASIX) 80 MG tablet Take 1 tablet (80 mg total) by mouth 2 (two) times daily.  Marland Kitchen gabapentin (NEURONTIN) 300 MG capsule Take 1 capsule (300 mg total) by mouth at bedtime as needed (nerve pain). (Patient taking differently: Take 300 mg by mouth at bedtime. )  . loperamide (IMODIUM A-D) 2 MG tablet Take 2 mg by mouth 4 (four) times daily as needed for diarrhea or loose stools.  Marland Kitchen LORazepam (ATIVAN) 0.5 MG tablet Take 0.25 mg by mouth every 12 (twelve) hours as needed for anxiety.  Marland Kitchen  nebivolol (BYSTOLIC) 2.5 MG tablet Take 1 tablet (2.5 mg total) by mouth daily.  . potassium chloride 20 MEQ TBCR Take 40 mEq by mouth 2 (two) times daily.  Marland Kitchen saccharomyces boulardii  (FLORASTOR) 250 MG capsule Take 1 capsule (250 mg total) by mouth 2 (two) times daily.  . sertraline (ZOLOFT) 50 MG tablet TK 1 T PO  QHS.  Marland Kitchen traZODone (DESYREL) 100 MG tablet TK 1 T PO  HS.  Marland Kitchen albuterol (PROVENTIL) (2.5 MG/3ML) 0.083% nebulizer solution Take 3 mLs (2.5 mg total) by nebulization every 6 (six) hours as needed for wheezing or shortness of breath. (Patient not taking: Reported on 06/23/2016)  . aspirin EC 81 MG tablet Take 1 tablet (81 mg total) by mouth daily. (Patient not taking: Reported on 07/15/2016)  . famotidine (PEPCID) 20 MG tablet Take 1 tablet (20 mg total) by mouth 2 (two) times daily. (Patient not taking: Reported on 06/25/2016)  . feeding supplement (BOOST / RESOURCE BREEZE) LIQD Take 1 Container by mouth 3 (three) times daily between meals. (Patient not taking: Reported on 07/05/2016)  . rivaroxaban (XARELTO) 20 MG TABS tablet Take 1 tablet (20 mg total) by mouth daily with supper. (Patient not taking: Reported on 07/22/2016)    FAMILY HISTORY:  Her @FAMSTP (<SUBSCRIPT> error)@  SOCIAL HISTORY: She  reports that she quit smoking about 12 years ago. Her smoking use included Cigarettes. She has a 20.00 pack-year smoking history. She has never used smokeless tobacco. She reports that she does not drink alcohol or use drugs.  REVIEW OF SYSTEMS:   Unable as patient is unresponsive, sedated  and intubated.  SUBJECTIVE:  Unable as patient is unresponsive, sedated  and intubated   VITAL SIGNS: BP (!) 94/35   Pulse 68   Temp 97.2 F (36.2 C) (Oral)   Resp (!) 23   Ht 5\' 3"  (1.6 m)   Wt (!) 352 lb (159.7 kg)   SpO2 100%   BMI 62.35 kg/m   HEMODYNAMICS:  Blood Pressure soft post intubation  VENTILATOR SETTINGS:  FiO2: 100% Rate : 24 TV 420 Peep : 5  INTAKE / OUTPUT: I/O last 3 completed shifts: In: 3072.5 [I.V.:2472.5; IV [id number] Out: -210 [Urine:140]  PHYSICAL EXAMINATION: General: Obese, intubated sedated female supine in air bed. Neuro: Sedated  and Intubated, encephalopathic and lethargic prior to intubation HEENT: MM pink and moist, UTA for JVD , Oral ETT in place  Cardiovascular: RRR, no murmur, rub or gallop Lungs:  Coarse throughout, diminished per bases, minimal secretions Abdomen:  Obese, Non-tender, BS positive but quiet Musculoskeletal: + Foot drop, Pt. Has been bed bound x 4 years. Skin: Chronic skin changes,.  LABS:  BMET  Recent Labs Lab 07/18/16 0954 07/19/16 0335 07/20/16 0430  NA 134* 134* 134*  K 5.3* 3.7 3.7  CL 103 100* 102  CO2 14* 23 22  BUN 191* 94* 91*  CREATININE 19.29* 11.11* 10.36*  GLUCOSE 96 90 70    Electrolytes  Recent Labs Lab 07/17/16 1050 07/18/16 0954 07/19/16 0335 07/20/16 0430  CALCIUM 6.9* 7.2* 7.2* 7.2*  PHOS 8.3* 7.5*  --  6.8*    CBC  Recent Labs Lab 07/20/16 0429 07/21/16 0813 07/21/16 1000  WBC 11.2* 11.5* 9.3  HGB 8.1* 8.0* 8.4*  HCT 25.7* 26.1* 26.3*  PLT 163 125* PENDING    Coag's  Recent Labs Lab 07/17/16 0100 07/18/16 2323 07/19/16 1230  APTT 45* 123* 50*  INR 2.27  --   --     Sepsis Markers  Recent Labs Lab 07/17/16 0047  LATICACIDVEN 1.75    ABG  Recent Labs Lab 07/21/16 1032  PHART 7.177*  PCO2ART 66.6*  PO2ART 67.3*    Liver Enzymes  Recent Labs Lab 07/17/16 0037  07/18/16 0954 07/19/16 0335 07/20/16 0430  AST 15  --   --  13*  --   ALT 5*  --   --  8*  --   ALKPHOS 100  --   --  95  --   BILITOT 0.9  --   --  0.6  --   ALBUMIN 1.9*  < > 1.6* 1.5* 1.4*  < > = values in this interval not displayed.  Cardiac Enzymes  Recent Labs Lab 07/17/16 1025  TROPONINI <0.03    Glucose  Recent Labs Lab 07/20/16 1142 07/20/16 2352 07/21/16 0122 07/21/16 0427 07/21/16 0801 07/21/16 1027  GLUCAP 119* 74 78 78 79 76    Imaging Dg Chest Port 1 View  Result Date: 07/21/2016 CLINICAL DATA:  Lethargic.  History of hypertension. EXAM: PORTABLE CHEST 1 VIEW COMPARISON:  Chest radiograph 07/17/2016 and earlier  [facility]. Chest CT 03/10/2015. FINDINGS: Right IJ hemodialysis catheters in stable position with the distal tip projecting over the mid SVC. Cardiac silhouette is enlarged. There is diffuse pulmonary vascular congestion. Perihilar and bibasilar opacities are present. Overall, aeration of the lungs appear slightly decreased compared to 07/17/2016. No visible pneumothorax. IMPRESSION: Cardiomegaly with diffuse pulmonary vascular congestion. Perihilar and bibasilar opacities are present. Considerations include pulmonary edema or pneumonia, with scattered areas of atelectasis possible. Electronically Signed   By: Curlene Dolphin M.D.   On: 07/21/2016 11:00     STUDIES:  07/17/2016 Echo: EF: 55-60%, MV: Calcified [place], LA: Mildly dilated, RA: Mildly Dilated, PAP: 41 mm HG, Small pericardial effusion.  CT Abdomen:07/17/2016 Probable acute pancreatitis affecting the tail of pancreatitis, mild, no findings of advanced disease. Bilateral pleural effusions with dependent atelectasis and or pneumonia. Cardiomegaly and small pericardial effusion.  VQ Scan>>07/18/16  Low probability pulmonary thromboembolism.   Renal US>> 07/17/16 Atrophic andechogenic appearance of the right kidney. Nonvisualizationof the  left kidney and urinary bladder.  CULTURES: 1/26/>> Urine Culture >> E Coli 1/26>> MRSA Negative 1/30 Urinalysis  ANTIBIOTICS: Rocephin 1/26>> Diflucan 1/30>>  SIGNIFICANT EVENTS: HD Started>>07/18/2016 Intubation>> 07/21/2016  LINES/TUBES: ETT>> 07/21/2016 HD Cath>> 07/18/16  DISCUSSION: Acute Hypoxic, hypercarbic Respiratory Failure in setting of OSA/ ARF/ and suspected over sedation due to poor renal clearance. Requiring emergent intubation 1/30. She is a short term HD candidate x 2 weeks to see if ARF will turn around. Will need goals of care discussion with son and daughter, who are aware she has been admitted to ICU and is on life support.She is waking up and following commands, so hope for   short term intubation as mental status clears, and BiPAP/ CPAP at night once extubated.  ASSESSMENT / PLAN:  PULMONARY A: Acute Hypercarbic, hypoxemic Respiratory Failure in setting of ARF, OSA, and questionable CAP Urine Pneumococcus negative, Legionella pending P:   8 cc kg Wean FiO2 as able Wake up assessment daily CPAP trials daily as LOC improves ABG 1 hour after intubation CXR for ETT placement now and in am ABX per ID section Light sedation Consider BIPAP/ CPAP at HS  once extubated   CARDIOVASCULAR A:  Blood Pressure soft post intubation EF 55-60 % per echo VQ scan negative for PE Hx. Hypertension, but hypotensive post intubation Troponin negative 07/17/2016 PAF on heparin P:  Cortisol Hold metoprolol MAP goal >  65 Telemetry Monitoring Troponins x 3   RENAL A:   Acute RF with metabolic acidosis on admission, uncertain etiology Atrophic andechogenic appearance of the right kidney. Plan for HD x 2 weeks, then if no improvement will need  P:   HD per the renal team, but not long term HD  candidate  Replete Lytes as needed BMET daily Avoid nephrotoxic drugs Monitor for return of urine output Continue volume removal as blood pressure allows    GASTROINTESTINAL A:   Extreme Morbid Obesity P:   NPO for now SUP   HEMATOLOGIC A:   Anemia Leukocytosis No Obvious signs of bleeding   P:  CBC daily Transfuse for HGB of < 7  INFECTIOUS A:   Leukocytosis Afebrile ? Acute Pancreatitis ? CAP on admit : low threshold for HAP coverage Wound Left ankle  P:   Monitor Micro daily Antibiotics as above De-escalate as able Trend fever and WBC daily UA now Follow CXR Trend CMET  Lactate Wound care consulted  ENDOCRINE A:   Hypoglycemia this am   P:   CBG's Q 4 Add SSI as needed for CBG's >> 180 sustained  NEUROLOGIC A:   Encephalopathic etiology ?/ uremia Following commands as she awakens from sedation Follows simple commands P:   RASS  goal: -1 Lights on during the day Light , short acting sedation as oversedation may have lead to respiratory failure.    FAMILY  - Updates: I spoke with patient's son Larkin Ina. I updated him in full. I explained that his mother had  Developed respiratory failure and was placed on mechanical ventilation with hope to wean as she wakes up. I explained that she has multiple medical co-morbidities which make her care complex. I did speak with him about the fact HD is planned as a short term treatment, and that is her kidneys don't improve short term we will need to have discussions regarding goals of care. He verbalized understanding. He also has a sister  Tur[place] who lives in [place] family meet or Palliative Care meeting due by:  07/24/2016   Magdalen Spatz, AGACNP-BC [place] Pager: [phone]  07/21/2016, 11:32 AM  STAFF NOTE: Linwood Dibbles, MD FACP have personally reviewed patient's available data, including medical history, events of note, physical examination and test results as part of my evaluation. I have discussed with resident/NP and other care providers such as pharmacist, RN and RRT. In addition, I personally evaluated patient and elicited key findings of: unresposnove, called emergent to bedside earlier for acute on chornic resp acidosis this am , no won NIMV remaining unresponsove, coarse crackles, no ronchi, no wheezing, massive obesity, limited edema, wound rt lower ext,m cuff BP left ankle, she requires emergent intubation, failed NIMV attempts, this is likely multifactorial, concern narcs/ ambien in setting acute renal failure and continued need for HD, noted she is NOT outpt candidate  HD, to 8 cc/kg, rate 24, abg to follow, pcxr stat, low threshold hcap coverage, not indicated at this stage, dc all sedation agents long acting, renal aware declines, no family at bedside, volume removal needed The patient is critically  ill with multiple organ systems failure and requires high complexity decision making for assessment and support, frequent evaluation and titration of therapies, application of advanced monitoring technologies and extensive interpretation of multiple databases.   Critical Care Time devoted to patient care services described in this note is40 Minutes. This time reflects time of care of this signee:  Merrie Roof, MD FACP. This critical care time does not reflect procedure time, or teaching time or supervisory time of PA/NP/Med student/Med Resident etc but could involve care discussion time. Rest per NP/medical resident whose note is outlined above and that I agree with   Lavon Paganini. Titus Mould, MD, [place] Pgr: [facility] 07/21/2016 2:06 PM

## 2016-07-21 NOTE — Progress Notes (Signed)
eLink Physician-Brief Progress Note Patient Name: [name] DOB: 1950-11-20 MRN: [id number]   Date of Service  07/21/2016  HPI/Events of Note  FSBS dropping  eICU Interventions  Change D5 to D10 infusion     Intervention Category Evaluation Type: Other  Flora Lipps 07/21/2016, 9:50 PM

## 2016-07-21 NOTE — Progress Notes (Signed)
[facility] Progress Note    Assessment/ Plan:   1. Renal failure - dehydration from diarrheal illness/ diuretics +/- ATN. Last creat here was 1.0 in 2016. Pt is not long-term HD candidate. Getting short-term HD while awaiting to see if renal fxn recovers.  - No signs of recovery --> Plan on HD tomorrow on a MWF  for 1-2 weeks and if no improvement will need to seriously consider withdrawal. She is certainly not a candidate for long term dialysis as it would not improve her QOL. - I will d/w family member (son may have been bedside) to determine whether she has any recent labs in the past 6 mths. 2. Uremia - improved, asterixis resolved, sp HD Sat 11/27. 3. HCAP - on IV abx, RLL infiltrate 4. Volume - doubt CHF clinically, CXR w vasc crowding 5. Chest pain - per primary, on IV hep empirically 6. Abd pain - lipase ^605, poss acute pancreatitis 7. UTI - urine cx+ (E.Coli Sens Rocephin). 8. 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 Debility/ bedbound x 4 yrs 10. Hyperkalemia - renal diet, HD 11. Chronic wound left LE 12. EOL - would address code status, she is full code now but was DNR on her last admit here in Sept 2017.   Subjective:   Intubated earlier today for respiratory distress.   Objective:   BP (!) 96/54   Pulse (!) 58   Temp 97.2 F (36.2 C) (Oral)   Resp (!) 23   Ht _0  (1.6 m)   Wt (!) 159.7 kg (352 lb)   SpO2 100%   BMI 62.35 kg/m   Intake/Output Summary (Last 24 hours) at 07/21/16 1535 Last data filed at 07/21/16 1400  Gross per 24 hour  Intake          2667.67 ml  Output             -350 ml  Net          3017.67 ml   Weight change:   Physical Exam: Morbidly obese, responds appropriately Chest clear ant/ lat RRR no mrg Abd obese, nontender, +bs Ext chronic skin chgs, no sig UE/ LE edema (very mild in the lower ext) RIJ catheter in place   Imaging: Portable Chest Xray  Result Date: 07/21/2016 CLINICAL DATA:  66 year old female with  lethargy, now intubated. Initial encounter. EXAM: PORTABLE CHEST 1 VIEW COMPARISON:  1040 hours today and earlier. FINDINGS: Portable AP supine view at 1149 hours. Endotracheal tube tip is in good position about 2.6 cm above the carina. Enteric tube has been placed and courses to the left abdomen, tip not included. Stable right IJ central line. Stable cardiomegaly and mediastinal contours. Mildly improved retrocardiac ventilation but continued obscuration of the left hemidiaphragm. Mildly improved right lung base ventilation with residual patchy opacity. Pulmonary vascularity is mildly regressed. No pneumothorax. No definite effusion. Postoperative changes to the right axilla and chest wall. IMPRESSION: 1. Intubated, ET tube in good position. Enteric tube placed and courses to the abdomen with tip not included. 2. Mildly improved bibasilar ventilation with residual retrocardiac collapse or consolidation. 3. Cardiomegaly. Interval decreased pulmonary vascularity with no overt edema. Electronically Signed   By: Genevie Ann M.D.   On: 07/21/2016 12:00   Dg Chest Port 1 View  Result Date: 07/21/2016 CLINICAL DATA:  Lethargic.  History of hypertension. EXAM: PORTABLE CHEST 1 VIEW COMPARISON:  Chest radiograph 07/17/2016 and earlier pri[facility]. Chest CT 03/10/2015. FINDINGS: Right IJ hemodialysis catheters  in stable position with the distal tip projecting over the mid SVC. Cardiac silhouette is enlarged. There is diffuse pulmonary vascular congestion. Perihilar and bibasilar opacities are present. Overall, aeration of the lungs appear slightly decreased compared to 07/17/2016. No visible pneumothorax. IMPRESSION: Cardiomegaly with diffuse pulmonary vascular congestion. Perihilar and bibasilar opacities are present. Considerations include pulmonary edema or pneumonia, with scattered areas of atelectasis possible. Electronically Signed   By: Curlene Dolphin M.D.   On: 07/21/2016 11:00    Labs: BMET  Recent Labs Lab  07/17/16 0037 07/17/16 1050 07/18/16 0954 07/19/16 0335 07/20/16 0430 07/21/16 1000  NA 135 134* 134* 134* 134* 131*  K 6.4* 6.0* 5.3* 3.7 3.7 3.9  CL 105 99* 103 100* 102 97*  CO2 13* 14* 14* _0 GLUCOSE 67 77 96 90 70 83  BUN 190* 190* 191* 94* 91* 65*  CREATININE 19.66* 18.29* 19.29* 11.11* 10.36* 7.99*  CALCIUM 7.1* 6.9* 7.2* 7.2* 7.2* 8.0*  PHOS  --  8.3* 7.5*  --  6.8* 6.2*   CBC  Recent Labs Lab 06/26/2016 2311 07/18/16 0954 07/19/16 0335 07/20/16 0429 07/21/16 0813 07/21/16 1000  WBC 13.9* 11.2* 11.0* 11.2* 11.5* 9.3  NEUTROABS 11.7* 8.7*  --   --   --  7.0  HGB 10.1* 9.4* 8.4* 8.1* 8.0* 8.4*  HCT 30.9* 29.0* 25.7* 25.7* 26.1* 26.3*  MCV 97.5 97.3 96.3 99.2 99.6 99.6  PLT 211 210 181 163 125* 115*    Medications:    . aspirin EC  81 mg Oral Daily  . cefTRIAXone (ROCEPHIN)  IV  1 g Intravenous Q24H  . chlorhexidine gluconate (MEDLINE KIT)  15 mL Mouth Rinse BID  . feeding supplement (PRO-STAT SUGAR FREE 64)  30 mL Oral BID  . fluconazole (DIFLUCAN) IV  50 mg Intravenous Q24H  . mouth rinse  15 mL Mouth Rinse 10 times per day  . nystatin   Topical BID  . pantoprazole (PROTONIX) IV  40 mg Intravenous Q24H      Otelia Santee, MD 07/21/2016, 3:35 PM

## 2016-07-21 NOTE — Progress Notes (Signed)
PROGRESS NOTE    [name]  [id number] DOB: 1951/05/01 DOA: 07/09/2016 PCP: Alvester Chou, NP   Brief Narrative:  Patient is a 66 year old female history of morbid obesity bedbound for several months, DVT on chronic anticoagulation who hasn't taken her anticoagulation approximately 6 days prior to admission, hypertension, depression anxiety, atrial fibrillation Thomas obstructive sleep apnea, diastolic heart failure presented to the ED with generalized weakness, chest pain, shortness of breath and abdominal pain. Patient noted to be in acute renal failure with a creatinine of 19.66, BUN of 190, potassium of 6.4, bicarbonate of 13. Patient also noted with significant hyperkalemia, probable community-acquired pneumonia, chest pain, probable UTI, and acute pancreatitis.   Assessment & Plan:   Principal Problem:   Chest pain Active Problems:   Acute renal failure (HCC)   Acute encephalopathy   PAF (paroxysmal atrial fibrillation) (HCC)   Metabolic acidosis   Uremia, acute   BMI 70 and over, adult ([place])   HTN (hypertension)   Obstructive sleep apnea on CPAP   CAP (community acquired pneumonia)   Hyperkalemia   Dermatitis fungal   Depression   Essential hypertension   Acute on chronic diastolic congestive heart failure (HCC)   Hypoglycemia   Generalized weakness   Pancreatitis   Abdominal pain   S/P hemodialysis catheter insertion (HCC)   E. coli UTI  #1 chest pain Questionable etiology. Likely multifactorial secondary to probable community-acquired pneumonia, volume overload secondary to acute renal failure, concern for possible PE as patient was off her anticoagulation for 6 days prior to admission with a history of DVT. Patient with clinical improvement. Initial set of cardiac enzymes negative. 2-D echo with EF of 55-60% with no wall motion abnormalities, no evidence of thrombus, small pericardial effusion identified circumferential to the heart. Urine pneumococcus antigen  negative. Urine Legionella antigen pending. Patient given a dose of IV Lasix 160 mg 1 on 07/19/2016, however with no significant urine output. Continue empiric IV Rocephin and IV doxycycline for probable community-acquired pneumonia. V/Q scan negative for PE. Patient likely for hemodialysis today. Follow.  #2 acute encephalopathy Patient is lethargic. Questionable etiology. Concern for uremia as patient had presented with acute renal failure however has been getting hemodialysis. BUN from 07/20/2016 was 91. Renal panel pending. Check an ammonia level, ABG, chest x-ray. Will discontinue anxiolytics. Keep patient nothing by mouth. Transfer to stepdown unit for closer monitoring.  #3 probable community-acquired pneumonia Chest x-ray. Urine pneumococcus antigen negative. Urine Legionella antigen pending. Patient currently afebrile. White count at 11.5.  Continue empiric IV Rocephin and IV doxycycline. Follow.  #4 acute pancreatitis Clinical improvement. Patient noted to have a elevated lipase on admission of 671. CT abdomen and pelvis consistent with an acute pancreatitis in the tail of the pancreas, with bilateral pleural effusions with dependent atelectasis or pneumonia, calcified gallstone dependent in the gallbladder. LFTs were not elevated. Repeat lipase pending and lipase levels trending down. Labs pending. Unable to place patient on IV fluids secondary to acute renal failure. Trial of clear liquids was attempted on 07/18/2016 however patient was noted to be nauseous with dry heaves and abdominal pain right afterwards. Will keep patient nothing by mouth until she is more alert. Symptomatic treatment. Follow.  #5 acute renal failure with uremia/hyperkalemia/metabolic acidosis Patient presented with acute renal failure, with a creatinine of 19.66 on admission, BUN of 190, hyperkalemic. Labs from 07/21/2016 with improvement in renal function, metabolic acidosis, hyperkalemia after dialysis 07/19/2016 and  07/18/2016. Reanl function improving with HD. Labs pending this morning.  Questionable etiology. Concern for a prerenal azotemia as patient had poor oral intake prior to admission versus ATN. Patient did receive an amp of bicarbonate as well as D10 at 50 mL an hour yesterday.  Patient is uremic on examination with asterixis, elevated BUN. Patient also noted to be hyperkalemic however labs with resolution of hyperkalemia. Patient has been seen in consultation by nephrology and patient underwent HD. Patient being followed by nephrology and likely scheduled for hemodialysis today.  #6 Ecoli UTI Urine cultures consistent with Escherichia coli. Continue IV Rocephin.  #7 hyperkalemia Patient had received an amp of bicarbonate, D50 as well as some Kayexalate 07/18/2015. Patient status post hemodialysis on 07/18/2016. Potassium of 3.7 today 07/19/2016.   #8 metabolic acidosis Likely secondary to acute renal failure. Patient status post hemodialysis yesterday with resolution of metabolic acidosis. Morning labs are pending.  #9 paroxysmal atrial fibrillation CHA2DS2VASC score = 4 Currently rate controlled on bystolic. Patient on IV heparin. Once no further procedures are to be done patient can be transitioned to her home dose of eliquis. Will discontinue by systolic as patient is lethargic and placed on IV Lopressor for rate control.  #10 history of DVT Patient has been off eliquis for 6 days prior to admission. VQ scan with low probability for PE. On IV heparin.  #11 obstructive sleep apnea C Pap daily at bedtime.  #12 depression anxiety No suicidal or homicidal ideations. D/C Ativan, Xanax. Zoloft on hold. Resume anxiolytics when more alert.  #13 hypertension D/c Bystolic. IV Lopressor until more alert.  #14 hypoglycemia CBGs have ranged from 76-79. NPO until more alert. Follow closely.  #15 acute on chronic diastolic heart failure Patient noted to have elevated BUN on examination. Some  improvement with lower extremity edema as patient did receive some IV Lasix and HD yesterday. Cardiac enzymes negative 1. 2-D echo with a EF of 55-60% with no wall motion abnormalities. Patient with no significant response to IV diuretics. Patient s/p hemodialysis yesterday likely for hemodialysis today. Follow.  #16 morbid obesity/debility Patient states she's been bedbound for approximately 5-6 months. PT/OT.  #17 Intertriginous dermatitis Patient on daily Diflucan as well as interdry.  #18 left lower extremity circumferential wounds Patient has been assessed by the wound care nurse and continue current recommendations.   DVT prophylaxis: Heparin Code Status: Full Family Communication: No family at bedside. Disposition Plan: Home versus skilled nursing facility once clinically improved, acute renal failure resolved and per nephrology.   Consultants:   Nephrology: Dr. Mercy Moore 07/17/2016  Procedures:   Right IJ catheter placed per pulmonary 07/17/2016  CT abdomen and pelvis 07/17/2016  Chest x-ray 07/17/2016  Renal ultrasound 07/17/2016  2-D echo 07/17/2016  V/Q scan 07/18/2016   Antimicrobials:   IV Rocephin 07/17/2016  IV doxycycline 07/17/2016   Subjective: Patient sleeping, difficult to arouse, lethargic. On C Pap. Per nursing patient unresponsive last night and this morning. On entering the room patient's eyes are open however patient is drowsy/lethargic. Patient with gag reflex.   Objective: Vitals:   07/21/16 0130 07/21/16 0428 07/21/16 1022 07/21/16 1025  BP: (!) 97/48 (!) 103/47 (!) 115/42   Pulse: 86 70 68   Resp: (!) 22 (!) 23    Temp:      TempSrc:   Oral   SpO2: 97% 98% 92% (!) 88%  Weight:  (!) 159.7 kg (352 lb)    Height:        Intake/Output Summary (Last 24 hours) at 07/21/16 1037 Last data filed at 07/21/16  0930  Gross per 24 hour  Intake             2015 ml  Output             -250 ml  Net             2265 ml   Filed Weights    07/20/16 1434 07/20/16 1804 07/21/16 0428  Weight: (!) 155.5 kg (342 lb 13 oz) (!) 155.9 kg (343 lb 11.2 oz) (!) 159.7 kg (352 lb)    Examination:  General exam: Morbidly obese. Sleepy, lethargic. Respiratory system: CTAB anterior lung fields.  Respiratory effort normal. Cardiovascular system: Irregularly irregular. S1 & S2 heard, RRR. No JVD, murmurs, rubs, gallops or clicks. No pedal edema. Gastrointestinal system: Abdomen is nondistended, soft and nontender. No organomegaly or masses felt. Normal bowel sounds heard. Central nervous system: Drowsy,Lethargic. Patient with gag reflex. Unable to assess rest of neurological exam. Extremities: Symmetric 5 x 5 power. Skin: No rashes, lesions or ulcers Psychiatry: Unable to assess secondary to mental status.    Data Reviewed: I have personally reviewed following labs and imaging studies  CBC:  Recent Labs Lab 06/26/2016 2311 07/18/16 0954 07/19/16 0335 07/20/16 0429 07/21/16 0813  WBC 13.9* 11.2* 11.0* 11.2* 11.5*  NEUTROABS 11.7* 8.7*  --   --   --   HGB 10.1* 9.4* 8.4* 8.1* 8.0*  HCT 30.9* 29.0* 25.7* 25.7* 26.1*  MCV 97.5 97.3 96.3 99.2 99.6  PLT 211 210 181 163 0000000*   Basic Metabolic Panel:  Recent Labs Lab 07/17/16 0037 07/17/16 1050 07/18/16 0954 07/19/16 0335 07/20/16 0430  NA 135 134* 134* 134* 134*  K 6.4* 6.0* 5.3* 3.7 3.7  CL 105 99* 103 100* 102  CO2 13* 14* 14* 23 22  GLUCOSE 67 77 96 90 70  BUN 190* 190* 191* 94* 91*  CREATININE 19.66* 18.29* 19.29* 11.11* 10.36*  CALCIUM 7.1* 6.9* 7.2* 7.2* 7.2*  PHOS  --  8.3* 7.5*  --  6.8*   GFR: Estimated Creatinine Clearance: 7.9 mL/min (by C-G formula based on SCr of 10.36 mg/dL (H)). Liver Function Tests:  Recent Labs Lab 07/17/16 0037 07/17/16 1050 07/18/16 0954 07/19/16 0335 07/20/16 0430  AST 15  --   --  13*  --   ALT 5*  --   --  8*  --   ALKPHOS 100  --   --  95  --   BILITOT 0.9  --   --  0.6  --   PROT 7.1  --   --  6.7  --   ALBUMIN 1.9*  1.9* 1.6* 1.5* 1.4*    Recent Labs Lab 07/17/16 0037 07/18/16 0954 07/19/16 0335 07/20/16 0430  LIPASE 671* 174* 91* 82*   No results for input(s): AMMONIA in the last 168 hours. Coagulation Profile:  Recent Labs Lab 07/17/16 0100  INR 2.27   Cardiac Enzymes:  Recent Labs Lab 07/17/16 1025  TROPONINI <0.03   BNP (last 3 results) No results for input(s): PROBNP in the last 8760 hours. HbA1C: No results for input(s): HGBA1C in the last 72 hours. CBG:  Recent Labs Lab 07/20/16 2352 07/21/16 0122 07/21/16 0427 07/21/16 0801 07/21/16 1027  GLUCAP 74 78 78 79 76   Lipid Profile: No results for input(s): CHOL, HDL, LDLCALC, TRIG, CHOLHDL, LDLDIRECT in the last 72 hours. Thyroid Function Tests: No results for input(s): TSH, T4TOTAL, FREET4, T3FREE, THYROIDAB in the last 72 hours. Anemia Panel: No results for input(s): VITAMINB12,  FOLATE, FERRITIN, TIBC, IRON, RETICCTPCT in the last 72 hours. Sepsis Labs:  Recent Labs Lab 07/17/16 0047  LATICACIDVEN 1.75    Recent Results (from the past 240 hour(s))  Urine culture     Status: Abnormal   Collection Time: 07/17/16  3:49 AM  Result Value Ref Range Status   Specimen Description URINE, RANDOM  Final   Special Requests NONE  Final   Culture >=100,000 COLONIES/mL ESCHERICHIA COLI (A)  Final   Report Status 07/19/2016 FINAL  Final   Organism ID, Bacteria ESCHERICHIA COLI (A)  Final      Susceptibility   Escherichia coli - MIC*    AMPICILLIN 8 SENSITIVE Sensitive     CEFAZOLIN <=4 SENSITIVE Sensitive     CEFTRIAXONE <=1 SENSITIVE Sensitive     CIPROFLOXACIN <=0.25 SENSITIVE Sensitive     GENTAMICIN <=1 SENSITIVE Sensitive     IMIPENEM <=0.25 SENSITIVE Sensitive     NITROFURANTOIN <=16 SENSITIVE Sensitive     TRIMETH/SULFA <=20 SENSITIVE Sensitive     AMPICILLIN/SULBACTAM 4 SENSITIVE Sensitive     PIP/TAZO <=4 SENSITIVE Sensitive     Extended ESBL NEGATIVE Sensitive     * >=100,000 COLONIES/mL ESCHERICHIA  COLI  MRSA PCR Screening     Status: None   Collection Time: 07/17/16 12:31 PM  Result Value Ref Range Status   MRSA by PCR NEGATIVE NEGATIVE Final    Comment:        The GeneXpert MRSA Assay (FDA approved for NASAL specimens only), is one component of a comprehensive MRSA colonization surveillance program. It is not intended to diagnose MRSA infection nor to guide or monitor treatment for MRSA infections.          Radiology Studies: No results found.      Scheduled Meds: . albumin human  25 g Intravenous Once  . ALPRAZolam  1 mg Oral BID  . aspirin EC  81 mg Oral Daily  . cefTRIAXone (ROCEPHIN)  IV  1 g Intravenous Q24H  . doxycycline (VIBRAMYCIN) IV  100 mg Intravenous Q12H  . feeding supplement (PRO-STAT SUGAR FREE 64)  30 mL Oral BID  . fluconazole  50 mg Oral Q24H  . gabapentin  300 mg Oral QHS  . nebivolol  2.5 mg Oral Daily  . nystatin   Topical BID  . pantoprazole  20 mg Oral Daily  . saccharomyces boulardii  250 mg Oral BID   Continuous Infusions: . dextrose 5 % and 0.45% NaCl 60 mL/hr at 07/21/16 0342  . heparin 1,250 Units/hr (07/21/16 0438)     LOS: 4 days    Time spent: 64 mins    THOMPSON,DANIEL, MD [facility] Pager [phone]  If 7PM-7AM, please contact night-coverage [email or web address] Password [email or web address] 07/21/2016, 10:37 AM

## 2016-07-21 NOTE — Progress Notes (Signed)
Acute respiratory failure/acidosis Patient noted to be lethargic earlier on this morning. Came up assessed patient. ABG obtained with a pH of 7.18 PCO2 of 66 PO2 of 67 with a bicarbonate of 23 area patient currently on BiPAP. Chest x-ray pending. Spoke with Dr. Titus Mould of critical care medicine who will come and assess the patient and likely intubate the patient and place patient on the ventilator. Patient to be transferred to the ICU.  Acute encephalopathy Likely secondary to problem #1.

## 2016-07-21 NOTE — Progress Notes (Signed)
Pt was intubated at the bedside and still awaiting chest xray to conform placement of airway. ABGs resulted. Report called to Mickel Baas Nurse in 4E. Attempted to notify son of pts status via phone numbers in chart. Unable to reach and no voicemail available. Will attempt later. Charge Nurse and Rapid Response transferring pt.

## 2016-07-21 NOTE — Progress Notes (Signed)
[facility] for IV heparin Indication: Hx of DVT and A-fib on chronic eliquis non-compliant now with CP, SOB and possible PE  Allergies  Allergen Reactions  . Effexor [Venlafaxine Hydrochloride] Anxiety    Made her want to kill herself    Patient Measurements: TBW: 152 kg Heparin Dosing Weight: 91.54kg  Vital Signs: Temp Source: Oral (01/30 1022) BP: 96/54 (01/30 1151) Pulse Rate: 60 (01/30 1151)  Assessment: 32 yoF on PTA apixaban for Afib and history of DVT. To continue on heparin gtt; patient was intubated today. Heparin level remains therapeutic at 0.49. Hgb low stable at 8.4, plts wnl. No bleeding noted.  Goal of Therapy:  Heparin level 0.3-0.7 units/ml Monitor platelets by anticoagulation protocol: Yes   Plan:  Continue heparin gtt at 1,250 units/hr Daily heparin level, CBC Monitor s/sx of bleeding   Gwenlyn Perking, PharmD PGY1 Pharmacy Resident Pager: [phone] 07/21/2016 1:15 PM

## 2016-07-21 NOTE — Care Management Important Message (Signed)
Important Message  Patient Details  Name: [name] MRN: [id number] Date of Birth: [birth year]   Medicare Important Message Given:  Yes    Lezley Bedgood 07/21/2016, 12:06 PM

## 2016-07-21 NOTE — Progress Notes (Signed)
RT attempted 3x to obtain post intubation ABG with only flashes of arterial blood obtained. CCM NP made aware. CCM MD paged. RT will continue to monitor.

## 2016-07-21 NOTE — Progress Notes (Signed)
Nurse at bedside about to give medications. Pt is on CPAP. Unable to follow commands. Medications not igven. Rapid Response called to floor. Dr Wynetta Emery at bedside.

## 2016-07-22 ENCOUNTER — Inpatient Hospital Stay (HOSPITAL_COMMUNITY): Payer: Medicare Other

## 2016-07-22 DIAGNOSIS — J9601 Acute respiratory failure with hypoxia: Secondary | ICD-10-CM

## 2016-07-22 DIAGNOSIS — J9602 Acute respiratory failure with hypercapnia: Secondary | ICD-10-CM

## 2016-07-22 DIAGNOSIS — N179 Acute kidney failure, unspecified: Secondary | ICD-10-CM

## 2016-07-22 LAB — URINALYSIS, ROUTINE W REFLEX MICROSCOPIC
Bilirubin Urine: NEGATIVE
Glucose, UA: NEGATIVE mg/dL
Ketones, ur: NEGATIVE mg/dL
Nitrite: NEGATIVE
PROTEIN: 100 mg/dL — AB
SPECIFIC GRAVITY, URINE: 1.014 (ref 1.005–1.030)
pH: 5 (ref 5.0–8.0)

## 2016-07-22 LAB — COMPREHENSIVE METABOLIC PANEL
ALBUMIN: 1.4 g/dL — AB (ref 3.5–5.0)
ALK PHOS: 92 U/L (ref 38–126)
ALT: 8 U/L — AB (ref 14–54)
AST: 15 U/L (ref 15–41)
Anion gap: 14 (ref 5–15)
BILIRUBIN TOTAL: 0.5 mg/dL (ref 0.3–1.2)
BUN: 65 mg/dL — AB (ref 6–20)
CALCIUM: 7.7 mg/dL — AB (ref 8.9–10.3)
CO2: 19 mmol/L — ABNORMAL LOW (ref 22–32)
CREATININE: 8.1 mg/dL — AB (ref 0.44–1.00)
Chloride: 96 mmol/L — ABNORMAL LOW (ref 101–111)
GFR calc Af Amer: 5 mL/min — ABNORMAL LOW (ref >60)
GFR, EST NON AFRICAN AMERICAN: 5 mL/min — AB (ref >60)
GLUCOSE: 93 mg/dL (ref 65–99)
Potassium: 3.2 mmol/L — ABNORMAL LOW (ref 3.5–5.1)
Sodium: 129 mmol/L — ABNORMAL LOW (ref 135–145)
TOTAL PROTEIN: 6.1 g/dL — AB (ref 6.5–8.1)

## 2016-07-22 LAB — MAGNESIUM: MAGNESIUM: 1.4 mg/dL — AB (ref 1.7–2.4)

## 2016-07-22 LAB — GLUCOSE, CAPILLARY
GLUCOSE-CAPILLARY: 102 mg/dL — AB (ref 65–99)
GLUCOSE-CAPILLARY: 65 mg/dL (ref 65–99)
GLUCOSE-CAPILLARY: 70 mg/dL (ref 65–99)
GLUCOSE-CAPILLARY: 94 mg/dL (ref 65–99)
Glucose-Capillary: 98 mg/dL (ref 65–99)
Glucose-Capillary: 75 mg/dL (ref 65–99)
Glucose-Capillary: 71 mg/dL (ref 65–99)

## 2016-07-22 LAB — TROPONIN I: Troponin I: <0.03 ng/mL (ref <0.03)

## 2016-07-22 LAB — HEPARIN LEVEL (UNFRACTIONATED)
HEPARIN UNFRACTIONATED: 0.22 [IU]/mL — AB (ref 0.30–0.70)
Heparin Unfractionated: 0.2 IU/mL — ABNORMAL LOW (ref 0.30–0.70)
Heparin Unfractionated: 0.18 IU/mL — ABNORMAL LOW (ref 0.30–0.70)

## 2016-07-22 LAB — CBC
HCT: 23.2 % — ABNORMAL LOW (ref 36.0–46.0)
HEMOGLOBIN: 7.4 g/dL — AB (ref 12.0–15.0)
MCH: 31 pg (ref 26.0–34.0)
MCHC: 31.9 g/dL (ref 30.0–36.0)
MCV: 97.1 fL (ref 78.0–100.0)
Platelets: 143 10*3/uL — ABNORMAL LOW (ref 150–400)
RBC: 2.39 MIL/uL — AB (ref 3.87–5.11)
RDW: 16.3 % — ABNORMAL HIGH (ref 11.5–15.5)
WBC: 12.8 10*3/uL — ABNORMAL HIGH (ref 4.0–10.5)

## 2016-07-22 LAB — PHOSPHORUS: Phosphorus: 5.1 mg/dL — ABNORMAL HIGH (ref 2.5–4.6)

## 2016-07-22 LAB — LIPASE, BLOOD: Lipase: 89 U/L — ABNORMAL HIGH (ref 11–51)

## 2016-07-22 MED ORDER — HEPARIN BOLUS VIA INFUSION
1300.0000 [IU] | Freq: Once | INTRAVENOUS | Status: AC
  Administered 2016-07-22: 1300 [IU] via INTRAVENOUS
  Filled 2016-07-22: qty 1300

## 2016-07-22 MED ORDER — MAGNESIUM SULFATE IN D5W 1-5 GM/100ML-% IV SOLN
1.0000 g | Freq: Once | INTRAVENOUS | Status: AC
  Administered 2016-07-22: 1 g via INTRAVENOUS
  Filled 2016-07-22: qty 100

## 2016-07-22 NOTE — Progress Notes (Signed)
eLink Physician-Brief Progress Note Patient Name: Mallarie Glogowski DOB: 1950-08-06 MRN: [id number]   Date of Service  07/22/2016  HPI/Events of Note  K+ = 3.2, Mg++ = 1.4 and Creatinine = 8.1. Will not replace K+ given renal failure. Will cautiously replace Mg++.  eICU Interventions  Will order: 1. Magnesium Sulfate 1 gm IV now.      Intervention Category Major Interventions: Electrolyte abnormality - evaluation and management  Yasin Ducat Eugene 07/22/2016, 5:48 AM

## 2016-07-22 NOTE — Progress Notes (Signed)
[facility] Progress Note    Assessment/ Plan:   1. Renal failure - dehydration from diarrheal illness/ diuretics +/- ATN. Last creat here was 1.0 in 2016. Pt is not long-term HD candidate. Getting short-term HD while awaiting to see if renal fxn recovers.  - No signs of recovery --> very poor prognosis and will wait on HD pending CCM meeting w/ family. - I spoke with the son and he will try to find out who her doctor was or when her last labs were. She lived at home with a friend who was a caretaker. If she has had progressive renal failure then withdrawal of HD is appropriate as she's not a long term dialysis candidate. 2. Uremia - improved, asterixis resolved, sp HD Sat 11/27. 3. HCAP - on IV abx, RLL infiltrate 4. Volume - doubt CHF clinically, CXR w vasc crowding 5. Chest pain - per primary, on IV hep empirically 6. Abd pain - lipase ^605, poss acute pancreatitis 7. UTI - urine cx+ (E.Coli Sens Rocephin). 8. 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 Debility/ bedbound x 4 yrs 10. Hyperkalemia - renal diet, HD 11. Chronic wound left LE 12. EOL - would address code status, she is full code now but was DNR on her last admit here in Sept 2017  Subjective:   Intubated 07/21/16  for respiratory distress. Pressures drop w/ sedation req uptitration of pressors.  Son is bedside.   Objective:   BP (!) 93/49   Pulse 89   Temp 99.1 F (37.3 C) (Core (Comment))   Resp (!) 24   Ht _0  (1.6 m)   Wt (!) 164.7 kg (363 lb)   SpO2 96%   BMI 64.30 kg/m   Intake/Output Summary (Last 24 hours) at 07/22/16 1028 Last data filed at 07/22/16 0900  Gross per 24 hour  Intake          3551.27 ml  Output               30 ml  Net          3521.27 ml   Weight change: 9.656 kg (21 lb 4.6 oz)  Physical Exam: Morbidly obese, responds appropriately Chest clear intubated RRR no mrg Abd obese, nontender, +bs Ext chronic skin chgs, no sig UE/ LE edema (very mild in the lower ext) RIJ  catheter in place   Imaging: Portable Chest Xray  Result Date: 07/22/2016 CLINICAL DATA:  Sleep apnea.  Shortness of breath. EXAM: PORTABLE CHEST 1 VIEW COMPARISON:  07/21/2016. FINDINGS: Endotracheal tube, NG tube in stable position. Esophageal line noted with tip at gastroesophageal junction. Right IJ line in stable position. Cardiomegaly with diffuse bilateral pulmonary infiltrates again noted consistent congestive heart failure. Bilateral pneumonia cannot be excluded . Low lung volumes with basilar atelectasis. Small bilateral pleural effusions progressed from prior exam . No pneumothorax. Surgical clips right chest . IMPRESSION: 1. Endotracheal tube and NG tube noted. Esophageal line noted with its tip at the gastroesophageal junction, clinical correlation suggested. Right IJ line in stable position. 2. Cardiomegaly with diffuse bilateral pulmonary infiltrates again noted consistent congestive heart failure. Bilateral pneumonia cannot be excluded. Bilateral small pleural effusions, progressed from prior exam . Electronically Signed   By: Marcello Moores  Register   On: 07/22/2016 06:52   Portable Chest Xray  Result Date: 07/21/2016 CLINICAL DATA:  66 year old female with lethargy, now intubated. Initial encounter. EXAM: PORTABLE CHEST 1 VIEW COMPARISON:  1040 hours today and earlier. FINDINGS: Portable  AP supine view at 1149 hours. Endotracheal tube tip is in good position about 2.6 cm above the carina. Enteric tube has been placed and courses to the left abdomen, tip not included. Stable right IJ central line. Stable cardiomegaly and mediastinal contours. Mildly improved retrocardiac ventilation but continued obscuration of the left hemidiaphragm. Mildly improved right lung base ventilation with residual patchy opacity. Pulmonary vascularity is mildly regressed. No pneumothorax. No definite effusion. Postoperative changes to the right axilla and chest wall. IMPRESSION: 1. Intubated, ET tube in good position.  Enteric tube placed and courses to the abdomen with tip not included. 2. Mildly improved bibasilar ventilation with residual retrocardiac collapse or consolidation. 3. Cardiomegaly. Interval decreased pulmonary vascularity with no overt edema. Electronically Signed   By: Genevie Ann M.D.   On: 07/21/2016 12:00   Dg Chest Port 1 View  Result Date: 07/21/2016 CLINICAL DATA:  Lethargic.  History of hypertension. EXAM: PORTABLE CHEST 1 VIEW COMPARISON:  Chest radiograph 07/17/2016 and earlier priors. Chest CT 03/10/2015. FINDINGS: Right IJ hemodialysis catheters in stable position with the distal tip projecting over the mid SVC. Cardiac silhouette is enlarged. There is diffuse pulmonary vascular congestion. Perihilar and bibasilar opacities are present. Overall, aeration of the lungs appear slightly decreased compared to 07/17/2016. No visible pneumothorax. IMPRESSION: Cardiomegaly with diffuse pulmonary vascular congestion. Perihilar and bibasilar opacities are present. Considerations include pulmonary edema or pneumonia, with scattered areas of atelectasis possible. Electronically Signed   By: Curlene Dolphin M.D.   On: 07/21/2016 11:00    Labs: BMET  Recent Labs Lab 07/17/16 0037 07/17/16 1050 07/18/16 0954 07/19/16 0335 07/20/16 0430 07/21/16 1000 07/22/16 0322  NA 135 134* 134* 134* 134* 131* 129*  K 6.4* 6.0* 5.3* 3.7 3.7 3.9 3.2*  CL 105 99* 103 100* 102 97* 96*  CO2 13* 14* 14* _0 19*  GLUCOSE 67 77 96 90 70 83 93  BUN 190* 190* 191* 94* 91* 65* 65*  CREATININE 19.66* 18.29* 19.29* 11.11* 10.36* 7.99* 8.10*  CALCIUM 7.1* 6.9* 7.2* 7.2* 7.2* 8.0* 7.7*  PHOS  --  8.3* 7.5*  --  6.8* 6.2* 5.1*   CBC  Recent Labs Lab 07/19/2016 2311 07/18/16 0954  07/20/16 0429 07/21/16 0813 07/21/16 1000 07/22/16 0322  WBC 13.9* 11.2*  < > 11.2* 11.5* 9.3 12.8*  NEUTROABS 11.7* 8.7*  --   --   --  7.0  --   HGB 10.1* 9.4*  < > 8.1* 8.0* 8.4* 7.4*  HCT 30.9* 29.0*  < > 25.7* 26.1* 26.3* 23.2*   MCV 97.5 97.3  < > 99.2 99.6 99.6 97.1  PLT 211 210  < > 163 125* 115* 143*  < > = values in this interval not displayed.  Medications:    . aspirin EC  81 mg Oral Daily  . cefTRIAXone (ROCEPHIN)  IV  1 g Intravenous Q24H  . chlorhexidine gluconate (MEDLINE KIT)  15 mL Mouth Rinse BID  . feeding supplement (PRO-STAT SUGAR FREE 64)  30 mL Oral BID  . fluconazole (DIFLUCAN) IV  50 mg Intravenous Q24H  . mouth rinse  15 mL Mouth Rinse 10 times per day  . nystatin   Topical BID  . pantoprazole (PROTONIX) IV  40 mg Intravenous Q24H      Otelia Santee, MD 07/22/2016, 10:28 AM

## 2016-07-22 NOTE — Progress Notes (Signed)
PULMONARY / CRITICAL CARE MEDICINE   Name: [name] MRN: [id number] DOB: 1950-12-16    ADMISSION DATE:  07/15/2016 CONSULTATION DATE:  07/21/2016  REFERRING MD:  Malachy Moan. Grandville Silos, MD Internal Medicine  CHIEF COMPLAINT:  Acute Respiratory Failure  HISTORY OF PRESENT ILLNESS:   66yo woman with history of morbid obesity bedbound for several months, DVT on chronic anticoagulation who hasn't taken her anticoagulation approximately 6 days prior to admission, hypertension, depression, anxiety, atrial fibrillation , obstructive sleep apnea, diastolic heart failure presented to the ED with generalized weakness, chest pain, shortness of breath and abdominal pain. Patient noted to be in acute renal failure with a creatinine of 19.66, BUN of 190, potassium of 6.4, bicarbonate of 13. Patient also noted with significant hyperkalemia, probable community-acquired pneumonia, chest pain, probable UTI, and acute pancreatitis, and metabolic acidosis. She was emergently dialyzed 1/27/and 1/28 with some improvement. She was noted to be less alert the morning of 07/21/2016. Of note she does get several sedating medications. ABG indicated ph of 7.177, CO2 of 66 and PO2 of 67. CCM was emergently consulted for stat intubation with transfer of care as pt. Was transferred to ICU on life support.  SUBJECTIVE:  Many issues overnight. Hypotension, Levophed started. Blood sugars dropping so D5 changed to D10. Hypothermia, bair hugger placed on patient. Hypokalemic at 3.2, but given significant renal failure she was given Mg supplementation. CCM had discussion with patient's son and she is now DNR.   VITAL SIGNS: BP (!) 90/47   Pulse 72   Temp (!) 96.4 F (35.8 C) (Core (Comment))   Resp (!) 24   Ht 5\' 3"  (1.6 m)   Wt (!) 363 lb (164.7 kg)   SpO2 99%   BMI 64.30 kg/m   VENTILATOR SETTINGS: Vent Mode: PRVC FiO2 (%):  [40 %-100 %] 40 % Set Rate:  [24 bmp] 24 bmp Vt Set:  [420 mL-480 mL] 420 mL PEEP:  [5 cmH20] 5  cmH20 Plateau Pressure:  [24 [id number] cmH20] 24 cmH20FiO2: 100% Rate : 24 TV 420 Peep : 5  INTAKE / OUTPUT: I/O last 3 completed shifts: In: 2966.4 [I.V.:2641.4; IV Piggyback:325] Out: -250 [Urine:100]  PHYSICAL EXAMINATION: General: Obese, intubated sedated female supine in air bed. Neuro: Sedated and Intubated, encephalopathic and lethargic prior to intubation HEENT: MM pink and moist, UTA for JVD , Oral ETT in place  Cardiovascular: RRR, no murmur, rub or gallop Lungs:  Coarse throughout, diminished per bases, minimal secretions Abdomen:  Obese, Non-tender, BS positive but quiet Musculoskeletal: + Foot drop, Pt. Has been bed bound x 4 years. Skin: Chronic skin changes,.  LABS:  BMET  Recent Labs Lab 07/20/16 0430 07/21/16 1000 07/22/16 0322  NA 134* 131* 129*  K 3.7 3.9 3.2*  CL 102 97* 96*  CO2 22 24 19*  BUN 91* 65* 65*  CREATININE 10.36* 7.99* 8.10*  GLUCOSE 70 83 93    Electrolytes  Recent Labs Lab 07/20/16 0430 07/21/16 1000 07/22/16 0322  CALCIUM 7.2* 8.0* 7.7*  MG  --   --  1.4*  PHOS 6.8* 6.2* 5.1*    CBC  Recent Labs Lab 07/21/16 0813 07/21/16 1000 07/22/16 0322  WBC 11.5* 9.3 12.8*  HGB 8.0* 8.4* 7.4*  HCT 26.1* 26.3* 23.2*  PLT 125* 115* 143*    Coag's  Recent Labs Lab 07/17/16 0100 07/18/16 2323 07/19/16 1230  APTT 45* 123* 50*  INR 2.27  --   --     Sepsis Markers  Recent Labs Lab  07/17/16 0047 07/21/16 1530  LATICACIDVEN 1.75 1.6    ABG  Recent Labs Lab 07/21/16 1032 07/21/16 2055  PHART 7.177* 7.355  PCO2ART 66.6* 38.5  PO2ART 67.3* 318.0*    Liver Enzymes  Recent Labs Lab 07/17/16 0037  07/19/16 0335 07/20/16 0430 07/21/16 1000 07/22/16 0322  AST 15  --  13*  --   --  15  ALT 5*  --  8*  --   --  8*  ALKPHOS 100  --  95  --   --  92  BILITOT 0.9  --  0.6  --   --  0.5  ALBUMIN 1.9*  < > 1.5* 1.4* 1.5* 1.4*  < > = values in this interval not displayed.  Cardiac Enzymes  Recent Labs Lab  07/17/16 1025 07/21/16 1555 07/22/16 0322  TROPONINI <0.03 <0.03 <0.03    Glucose  Recent Labs Lab 07/21/16 1549 07/21/16 2034 07/21/16 2103 07/22/16 0002 07/22/16 0411 07/22/16 0529  GLUCAP 85 66 92 70 65 98    Imaging Portable Chest Xray  Result Date: 07/21/2016 CLINICAL DATA:  66 year old female with lethargy, now intubated. Initial encounter. EXAM: PORTABLE CHEST 1 VIEW COMPARISON:  1040 hours today and earlier. FINDINGS: Portable AP supine view at 1149 hours. Endotracheal tube tip is in good position about 2.6 cm above the carina. Enteric tube has been placed and courses to the left abdomen, tip not included. Stable right IJ central line. Stable cardiomegaly and mediastinal contours. Mildly improved retrocardiac ventilation but continued obscuration of the left hemidiaphragm. Mildly improved right lung base ventilation with residual patchy opacity. Pulmonary vascularity is mildly regressed. No pneumothorax. No definite effusion. Postoperative changes to the right axilla and chest wall. IMPRESSION: 1. Intubated, ET tube in good position. Enteric tube placed and courses to the abdomen with tip not included. 2. Mildly improved bibasilar ventilation with residual retrocardiac collapse or consolidation. 3. Cardiomegaly. Interval decreased pulmonary vascularity with no overt edema. Electronically Signed   By: Genevie Ann M.D.   On: 07/21/2016 12:00   Dg Chest Port 1 View  Result Date: 07/21/2016 CLINICAL DATA:  Lethargic.  History of hypertension. EXAM: PORTABLE CHEST 1 VIEW COMPARISON:  Chest radiograph 07/17/2016 and earlier priors. Chest CT 03/10/2015. FINDINGS: Right IJ hemodialysis catheters in stable position with the distal tip projecting over the mid SVC. Cardiac silhouette is enlarged. There is diffuse pulmonary vascular congestion. Perihilar and bibasilar opacities are present. Overall, aeration of the lungs appear slightly decreased compared to 07/17/2016. No visible pneumothorax.  IMPRESSION: Cardiomegaly with diffuse pulmonary vascular congestion. Perihilar and bibasilar opacities are present. Considerations include pulmonary edema or pneumonia, with scattered areas of atelectasis possible. Electronically Signed   By: Curlene Dolphin M.D.   On: 07/21/2016 11:00     STUDIES:  07/17/2016 Echo: EF: 55-60%, MV: Calcified [place], LA: Mildly dilated, RA: Mildly Dilated, PAP: 41 mm HG, Small pericardial effusion.  CT Abdomen:07/17/2016 Probable acute pancreatitis affecting the tail of pancreatitis, mild, no findings of advanced disease. Bilateral pleural effusions with dependent atelectasis and or pneumonia. Cardiomegaly and small pericardial effusion.  VQ Scan>>07/18/16  Low probability pulmonary thromboembolism.   Renal US>> 07/17/16 Atrophic andechogenic appearance of the right kidney. Nonvisualizationof the  left kidney and urinary bladder.  CULTURES: 1/26/>> Urine Culture >> E Coli 1/26>> MRSA Negative 1/30 Urinalysis  ANTIBIOTICS: Rocephin 1/26>> Diflucan 1/30>>  SIGNIFICANT EVENTS: HD Started>>07/18/2016 Intubation>> 07/21/2016  LINES/TUBES: ETT>> 07/21/2016 HD Cath>> 07/18/16  DISCUSSION: Acute Hypoxic, hypercarbic Respiratory Failure in setting of OSA/  ARF/ and suspected over sedation due to poor renal clearance. Requiring emergent intubation 1/30. She is a short term HD candidate x 2 weeks to see if ARF will turn around.   ASSESSMENT / PLAN:  PULMONARY A: Acute Hypercarbic, hypoxemic Respiratory Failure in setting of ARF, OSA, and questionable CAP P:   8 cc kg Wean FiO2 as able Wake up assessment daily CPAP trials daily as LOC improves ABX as above   CARDIOVASCULAR A:  Hypotension EF 55-60 % per echo VQ scan negative for PE Hx HTN Troponin negative 07/17/2016 PAF on heparin P:  Levophed, wean as able Heparin gtt  Hold metoprolol MAP goal > 65 Telemetry Monitoring   RENAL A:   Acute RF with metabolic acidosis on admission, in  setting of dehydration from diarrheal illness/diuretics/ATN? Atrophic andechogenic appearance of the right kidney. P:   HD per the renal team, but not long term HD candidate  Replete Lytes as needed BMET daily Avoid nephrotoxic drugs Monitor for return of urine output Continue volume removal as blood pressure allows   GASTROINTESTINAL A:   Acute Pancreatitis  Extreme Morbid Obesity P:   NPO for now SUP Consider starting TFs   HEMATOLOGIC A:   Anemia Leukocytosis  P:  CBC daily Monitor for signs of bleeding Transfuse for HGB of < 7 Heparin gtt  INFECTIOUS A:   Leukocytosis Afebrile but hypothermia Ecoli UTI ? CAP on admit : low threshold for HCAP coverage Wound Left ankle  P:   Follow up cultures  Abx as above  Trend fever and WBC daily Trend CMET  Wound care consulted  ENDOCRINE A:   Hypoglycemia- cortisol nml P:   CBG's Q 4 Continue D10  NEUROLOGIC A:   Acute encephalopathy in setting of ARF/uremia, narcotics P:   RASS goal: -1 Lights on during the day Reduce sedation as able  Albin Felling, MD, MPH Internal Medicine Resident, PGY-III Pager: [phone]  Attending Note:  66 year old female with extensive PMH who has been bed ridden for the last 4 years who presents with VDRF that is likely related to a combination of narcotics, pulmonary edema, OSA and heart failure.  Patient was intubated on 1/30 and subsequently made DNR.  Patient is not a candidate for outpatient HD and family has been called and is to come in for likely progression to comfort care.  In the meantime, hold weaning, hold dialysis, awaiting family arrival.  The patient is critically ill with multiple organ systems failure and requires high complexity decision making for assessment and support, frequent evaluation and titration of therapies, application of advanced monitoring technologies and extensive interpretation of multiple databases.   Critical Care Time devoted to patient care  services described in this note is  35  Minutes. This time reflects time of care of this signee Dr Jennet Maduro. This critical care time does not reflect procedure time, or teaching time or supervisory time of PA/NP/Med student/Med Resident etc but could involve care discussion time.  Rush Farmer, M.D. [facility] Pulmonary/Critical Care Medicine. Pager: [phone]. After hours pager: [phone].  07/22/2016, 6:57 AM

## 2016-07-22 NOTE — Progress Notes (Addendum)
ANTICOAGULATION CONSULT NOTE - Follow Up Consult  Pharmacy Consult for Heparin (apixaban on hold) Indication: Hx DVT/Afib  Allergies  Allergen Reactions  . Effexor [Venlafaxine Hydrochloride] Anxiety    Made her want to kill herself   Patient Measurements: Height: 5\' 3"  (160 cm) Weight: (!) 352 lb (159.7 kg) IBW/kg (Calculated) : 52.4 Vital Signs: Temp: 94.3 F (34.6 C) (01/31 0230) Temp Source: Core (Comment) (01/31 0230) BP: 90/50 (01/31 0311) Pulse Rate: 62 (01/31 0230)  Labs:  Recent Labs  07/19/16 1230  07/20/16 0430 07/21/16 0813 07/21/16 1000 07/21/16 1204 07/21/16 1555 07/22/16 0322  HGB  --   < >  --  8.0* 8.4*  --   --  7.4*  HCT  --   < >  --  26.1* 26.3*  --   --  23.2*  PLT  --   < >  --  125* 115*  --   --  143*  APTT 50*  --   --   --   --   --   --   --   HEPARINUNFRC 0.53  --  0.61  --   --  0.49  --  0.22*  CREATININE  --   --  10.36*  --  7.99*  --   --   --   TROPONINI  --   --   --   --   --   --  <0.03  --   < > = values in this interval not displayed.  Estimated Creatinine Clearance: 10.3 mL/min (by C-G formula based on SCr of 7.99 mg/dL (H)).   Assessment: Heparin for multiple indications, heparin level sub-therapeutic this AM after multiple rate increases. No issues per RN.    Goal of Therapy:  Heparin level 0.3-0.7 units/ml Monitor platelets by anticoagulation protocol: Yes   Plan:  -Inc heparin to 1800 units/hr -1200 HL -Trend Hgb  Narda Bonds 07/22/2016,4:13 AM

## 2016-07-22 NOTE — Progress Notes (Signed)
[facility] for IV heparin Indication: Hx of DVT and A-fib on chronic eliquis non-compliant now with CP, SOB and possible PE  Allergies  Allergen Reactions  . Effexor [Venlafaxine Hydrochloride] Anxiety    Made her want to kill herself    Patient Measurements: TBW: 152 kg Heparin Dosing Weight: 91.54kg  Vital Signs: Temp: 98.8 F (37.1 C) (01/31 2000) Temp Source: Core (Comment) (01/31 2000) BP: 107/58 (01/31 2100) Pulse Rate: 93 (01/31 2100)  Assessment: 50 yoF on PTA apixaban for Afib and h/o DVT on hold, to continue on heparin gtt. Heparin level remains subtherapeutic at 0.18 on 1450 units/hr now. Hgb drop to 7.4 but no bleeding or line issues per RN and CCM wants to continue heparin.   Goal of Therapy:  Heparin level 0.3-0.7 units/ml Monitor platelets by anticoagulation protocol: Yes   Plan:  Bolus 1300 units Increase heparin gtt to 1600 units/hr Check heparin level in 8 hours Daily heparin level, CBC Monitor s/sx of bleeding  Levester Fresh, PharmD, BCPS, BCCCP Clinical Pharmacist 07/22/2016 9:31 PM

## 2016-07-22 NOTE — Progress Notes (Signed)
eLink Physician-Brief Progress Note Patient Name: [name] DOB: 1950-10-06 MRN: [id number]   Date of Service  07/22/2016  HPI/Events of Note  Hypothermia - Temp + 92.1 F.  eICU Interventions  Will order [facility].     Intervention Category Intermediate Interventions: Other:  Lysle Dingwall 07/22/2016, 12:33 AM

## 2016-07-22 NOTE — Progress Notes (Signed)
[place] [facility] for IV heparin Indication: Hx of DVT and A-fib on chronic eliquis non-compliant now with CP, SOB and possible PE  Allergies  Allergen Reactions  . Effexor [Venlafaxine Hydrochloride] Anxiety    Made her want to kill herself    Patient Measurements: TBW: 152 kg Heparin Dosing Weight: 91.54kg  Vital Signs: Temp: 101.3 F (38.5 C) (01/31 1200) Temp Source: Core (Comment) (01/31 1200) BP: 93/49 (01/31 1200) Pulse Rate: 115 (01/31 1200)  Assessment: 26 yoF on PTA apixaban for Afib and h/o DVT on hold, to continue on heparin gtt. Heparin level remains subtherapeutic at 0.20. Hgb drop to 7.4 but no bleeding or line issues per RN and CCM wants to continue heparin.   Goal of Therapy:  Heparin level 0.3-0.7 units/ml Monitor platelets by anticoagulation protocol: Yes   Plan:  Bolus 1300 units Increase heparin gtt to 1,450 units/hr Check heparin level in 8 hours Daily heparin level, CBC Monitor s/sx of bleeding   Gwenlyn Perking, PharmD PGY1 Pharmacy Resident Pager: [phone] 07/22/2016 1:05 PM

## 2016-07-23 ENCOUNTER — Inpatient Hospital Stay (HOSPITAL_COMMUNITY): Payer: Medicare Other

## 2016-07-23 LAB — BASIC METABOLIC PANEL
ANION GAP: 17 — AB (ref 5–15)
BUN: 69 mg/dL — ABNORMAL HIGH (ref 6–20)
CALCIUM: 7.5 mg/dL — AB (ref 8.9–10.3)
CHLORIDE: 91 mmol/L — AB (ref 101–111)
CO2: 18 mmol/L — AB (ref 22–32)
CREATININE: 8.25 mg/dL — AB (ref 0.44–1.00)
GFR calc non Af Amer: 4 mL/min — ABNORMAL LOW (ref >60)
GFR, EST AFRICAN AMERICAN: 5 mL/min — AB (ref >60)
Glucose, Bld: 107 mg/dL — ABNORMAL HIGH (ref 65–99)
Potassium: 3.2 mmol/L — ABNORMAL LOW (ref 3.5–5.1)
SODIUM: 126 mmol/L — AB (ref 135–145)

## 2016-07-23 LAB — BLOOD GAS, ARTERIAL
Acid-base deficit: 6.7 mmol/L — ABNORMAL HIGH (ref 0.0–2.0)
Bicarbonate: 18.6 mmol/L — ABNORMAL LOW (ref 20.0–28.0)
DRAWN BY: 449561
FIO2: 40
O2 Saturation: 97.3 %
PEEP: 5 cmH2O
Patient temperature: 97.7
RATE: 24 resp/min
VT: 420 mL
pCO2 arterial: 38.8 mmHg (ref 32.0–48.0)
pH, Arterial: 7.299 — ABNORMAL LOW (ref 7.350–7.450)
pO2, Arterial: 95.9 mmHg (ref 83.0–108.0)

## 2016-07-23 LAB — HEPARIN LEVEL (UNFRACTIONATED)
HEPARIN UNFRACTIONATED: 0.2 [IU]/mL — AB (ref 0.30–0.70)
Heparin Unfractionated: 0.19 IU/mL — ABNORMAL LOW (ref 0.30–0.70)

## 2016-07-23 LAB — CBC
HEMATOCRIT: 24.2 % — AB (ref 36.0–46.0)
HEMOGLOBIN: 7.9 g/dL — AB (ref 12.0–15.0)
MCH: 31.9 pg (ref 26.0–34.0)
MCHC: 32.6 g/dL (ref 30.0–36.0)
MCV: 97.6 fL (ref 78.0–100.0)
PLATELETS: 138 10*3/uL — AB (ref 150–400)
RBC: 2.48 MIL/uL — AB (ref 3.87–5.11)
RDW: 16.4 % — ABNORMAL HIGH (ref 11.5–15.5)
WBC: 16.4 10*3/uL — AB (ref 4.0–10.5)

## 2016-07-23 LAB — MAGNESIUM
MAGNESIUM: 1.5 mg/dL — AB (ref 1.7–2.4)
MAGNESIUM: 1.4 mg/dL — AB (ref 1.7–2.4)

## 2016-07-23 LAB — PHOSPHORUS
PHOSPHORUS: 4.4 mg/dL (ref 2.5–4.6)
PHOSPHORUS: 4.7 mg/dL — AB (ref 2.5–4.6)

## 2016-07-23 LAB — GLUCOSE, CAPILLARY
GLUCOSE-CAPILLARY: 100 mg/dL — AB (ref 65–99)
GLUCOSE-CAPILLARY: 148 mg/dL — AB (ref 65–99)
Glucose-Capillary: 100 mg/dL — ABNORMAL HIGH (ref 65–99)
Glucose-Capillary: 107 mg/dL — ABNORMAL HIGH (ref 65–99)
Glucose-Capillary: 118 mg/dL — ABNORMAL HIGH (ref 65–99)
Glucose-Capillary: 92 mg/dL (ref 65–99)

## 2016-07-23 MED ORDER — VASOPRESSIN 20 UNIT/ML IV SOLN
0.0300 [IU]/min | INTRAVENOUS | Status: DC
  Administered 2016-07-24: 0.03 [IU]/min via INTRAVENOUS
  Filled 2016-07-23 (×2): qty 2

## 2016-07-23 MED ORDER — SODIUM CHLORIDE 0.9 % IV SOLN
2500.0000 mg | Freq: Once | INTRAVENOUS | Status: AC
  Administered 2016-07-23: 2500 mg via INTRAVENOUS
  Filled 2016-07-23: qty 2500

## 2016-07-23 MED ORDER — SODIUM CHLORIDE 0.9 % IV SOLN
0.0000 ug/min | INTRAVENOUS | Status: DC
  Administered 2016-07-23: 10 ug/min via INTRAVENOUS
  Filled 2016-07-23: qty 1

## 2016-07-23 MED ORDER — PRO-STAT SUGAR FREE PO LIQD
60.0000 mL | Freq: Three times a day (TID) | ORAL | Status: DC
  Administered 2016-07-23 – 2016-07-24 (×5): 60 mL
  Filled 2016-07-23 (×5): qty 60

## 2016-07-23 MED ORDER — HEPARIN (PORCINE) IN NACL 100-0.45 UNIT/ML-% IJ SOLN
2000.0000 [IU]/h | INTRAMUSCULAR | Status: DC
  Administered 2016-07-23 – 2016-07-24 (×3): 2000 [IU]/h via INTRAVENOUS
  Filled 2016-07-23 (×3): qty 250

## 2016-07-23 MED ORDER — PIPERACILLIN-TAZOBACTAM 3.375 G IVPB
3.3750 g | Freq: Two times a day (BID) | INTRAVENOUS | Status: DC
  Administered 2016-07-23 – 2016-07-24 (×4): 3.375 g via INTRAVENOUS
  Filled 2016-07-23 (×5): qty 50

## 2016-07-23 MED ORDER — VITAL HIGH PROTEIN PO LIQD
1000.0000 mL | ORAL | Status: DC
  Administered 2016-07-23: 1000 mL
  Administered 2016-07-24: 04:00:00
  Administered 2016-07-24: 1000 mL
  Administered 2016-07-25: 09:00:00
  Administered 2016-07-25: 1000 mL

## 2016-07-23 MED ORDER — VANCOMYCIN HCL IN DEXTROSE 1-5 GM/200ML-% IV SOLN
1000.0000 mg | Freq: Once | INTRAVENOUS | Status: AC
  Administered 2016-07-23: 1000 mg via INTRAVENOUS
  Filled 2016-07-23 (×2): qty 200

## 2016-07-23 MED ORDER — NOREPINEPHRINE BITARTRATE 1 MG/ML IV SOLN
2.0000 ug/min | INTRAVENOUS | Status: DC
  Administered 2016-07-23: 50 ug/min via INTRAVENOUS
  Administered 2016-07-24: 12 ug/min via INTRAVENOUS
  Administered 2016-07-24: 30 ug/min via INTRAVENOUS
  Filled 2016-07-23 (×3): qty 16

## 2016-07-23 NOTE — Progress Notes (Signed)
Pharmacy Antibiotic Note  [name] is a 66 y.o. female admitted on 07/04/2016 with pneumonia.  Pharmacy has been consulted for vancomycin and zosyn dosing.  Also with new ESRD, last HD 1/27.  Planning HD again today in room.  Plan: Vancomycin 2500 mg x 1 loading dose now.  Then will supplement with 1g p HD if she tolerates session. Zosyn 3.375g IV q 12 hrs - 4 hr infusion. F/u LOT, clinical course.   Height: 5\' 3"  (160 cm) Weight: (!) 377 lb (171 kg) IBW/kg (Calculated) : 52.4  Temp (24hrs), Avg:98.5 F (36.9 C), Min:96.4 F (35.8 C), Max:101.3 F (38.5 C)   Recent Labs Lab 07/17/16 0047  07/19/16 0335 07/20/16 0429 07/20/16 0430 07/21/16 0813 07/21/16 1000 07/21/16 1530 07/22/16 0322 07/23/16 0206  WBC  --   < > 11.0* 11.2*  --  11.5* 9.3  --  12.8* 16.4*  CREATININE  --   < > 11.11*  --  10.36*  --  7.99*  --  8.10* 8.25*  LATICACIDVEN 1.75  --   --   --   --   --   --  1.6  --   --   < > = values in this interval not displayed.  Estimated Creatinine Clearance: 10.4 mL/min (by C-G formula based on SCr of 8.25 mg/dL (H)).    Allergies  Allergen Reactions  . Effexor [Venlafaxine Hydrochloride] Anxiety    Made her want to kill herself    Abx: CTX 1/26 >> 2/1 Doxycyline 1/26 >>1/30 Fluconazole 1/30> Vancomycin 2/1 > Zosyn 2/1 >  Micro: 1/26 UCx: E. Coli (panS) 1/26 MRSA PCR: negative Sputum: sent Urine strep PNA negative Legionella in process  Thank you for allowing pharmacy to be a part of this patient's care.  Uvaldo Rising, BCPS  Clinical Pharmacist Pager [phone]  07/23/2016 10:24 AM

## 2016-07-23 NOTE — Progress Notes (Signed)
eLink Physician-Brief Progress Note Patient Name: [name] DOB: 02/20/1951 MRN: [id number]   Date of Service  07/23/2016  HPI/Events of Note  On levo @ 71  eICU Interventions  Add vaso Neo gtt if needed     Intervention Category Major Interventions: Shock - evaluation and management  Wallace Cogliano V. 07/23/2016, 7:26 PM

## 2016-07-23 NOTE — Progress Notes (Signed)
ANTICOAGULATION CONSULT NOTE - Follow Up Consult  Pharmacy Consult for Heparin (apixaban on hold) Indication: Hx DVT/Afib  Allergies  Allergen Reactions  . Effexor [Venlafaxine Hydrochloride] Anxiety    Made her want to kill herself   Patient Measurements: Height: 5\' 3"  (160 cm) Weight: (!) 363 lb (164.7 kg) IBW/kg (Calculated) : 52.4 Vital Signs: Temp: 97.7 F (36.5 C) (02/01 0000) Temp Source: Core (Comment) (02/01 0000) BP: 130/63 (02/01 0200) Pulse Rate: 85 (02/01 0200)  Labs:  Recent Labs  07/20/16 0430  07/21/16 1000  07/21/16 1555 07/22/16 0322 07/22/16 1136 07/22/16 2050 07/23/16 0206  HGB  --   < > 8.4*  --   --  7.4*  --   --  7.9*  HCT  --   < > 26.3*  --   --  23.2*  --   --  24.2*  PLT  --   < > 115*  --   --  143*  --   --  138*  HEPARINUNFRC 0.61  --   --   < >  --  0.22* 0.20* 0.18* 0.19*  CREATININE 10.36*  --  7.99*  --   --  8.10*  --   --   --   TROPONINI  --   --   --   --  <0.03 <0.03  --   --   --   < > = values in this interval not displayed.  Estimated Creatinine Clearance: 10.4 mL/min (by C-G formula based on SCr of 8.1 mg/dL (H)).   Assessment: Heparin for multiple indications, heparin level sub-therapeutic this AM after multiple rate increases. No issues per RN.    Goal of Therapy:  Heparin level 0.3-0.7 units/ml Monitor platelets by anticoagulation protocol: Yes   Plan:  -Inc heparin to 1800 units/hr -1200 HL -Trend Hgb  [name] 07/23/2016,3:19 AM

## 2016-07-23 NOTE — Progress Notes (Signed)
Initial Nutrition Assessment  DOCUMENTATION CODES:   Morbid obesity  INTERVENTION:   Vital High Protein @ 25 ml/hr (600 ml/day) 60 ml Prostat TID Provides: 1200 kcal, 142 grams protein, and 501 ml H2O.   Monitor electrolytes.   NUTRITION DIAGNOSIS:   Inadequate oral intake related to inability to eat as evidenced by NPO status.  GOAL:   Provide needs based on ASPEN/SCCM guidelines  MONITOR:   TF tolerance, I & O's, Skin, Vent status  REASON FOR ASSESSMENT:   Consult Enteral/tube feeding initiation and management  ASSESSMENT:   Pt with hx of morbid obesity bedbound for several months, depression, OSA, diastolic heart failure and admitted 1/25 for probable CAP, UTI, acute pancreatitis, and ARF. Pt tx to ICU and intubated for acute hypoxic hypercarbic respiratory failure.    Per MD notes only short term HD, no trach/PEG and will plan one-way extubation after volume negative, if fails then comfort.  Nephews at bedside. They feel she might have not been eating well PTA. She lives alone and has a neighbor who is a Land.   Patient is currently intubated on ventilator support Labs reviewed: Na 126, K+ 3.2, magnesium 1.5, PO4 4.7, Lipase 89  Diet Order:  Diet NPO time specified  Skin:  Wound (see comment) (LLE cellulitis, MASD breast,butt,perineum,sacrum)  Last BM:  2/1 via rectal tube  Height:   Ht Readings from Last 1 Encounters:  07/21/16 5\' 3"  (1.6 m)    Weight:   Wt Readings from Last 1 Encounters:  07/23/16 (!) 377 lb (171 kg)    Ideal Body Weight:  52.2 kg  BMI:  Body mass index is 66.78 kg/m.  Estimated Nutritional Needs:   Kcal:  [id number]  Protein:  >/=130 grams  Fluid:  > 1.5 L/day  EDUCATION NEEDS:   No education needs identified at this time  [place], [place], [place] Pager [phone] After Hours Pager

## 2016-07-23 NOTE — Progress Notes (Signed)
ANTICOAGULATION CONSULT NOTE - Follow Up Consult  Pharmacy Consult for Heparin (apixaban on hold) Indication: Hx DVT/Afib  Allergies  Allergen Reactions  . Effexor [Venlafaxine Hydrochloride] Anxiety    Made her want to kill herself   Patient Measurements: Height: 5\' 3"  (160 cm) Weight: (!) 377 lb (171 kg) IBW/kg (Calculated) : 52.4 Vital Signs: Temp: 99.3 F (37.4 C) (02/01 1200) Temp Source: Oral (02/01 1200) BP: 125/54 (02/01 1300) Pulse Rate: 82 (02/01 1300)  Labs:  Recent Labs  07/21/16 1000  07/21/16 1555 07/22/16 0322  07/22/16 2050 07/23/16 0206 07/23/16 1206  HGB 8.4*  --   --  7.4*  --   --  7.9*  --   HCT 26.3*  --   --  23.2*  --   --  24.2*  --   PLT 115*  --   --  143*  --   --  138*  --   HEPARINUNFRC  --   < >  --  0.22*  < > 0.18* 0.19* 0.20*  CREATININE 7.99*  --   --  8.10*  --   --  8.25*  --   TROPONINI  --   --  <0.03 <0.03  --   --   --   --   < > = values in this interval not displayed.  Estimated Creatinine Clearance: 10.4 mL/min (by C-G formula based on SCr of 8.25 mg/dL (H)).   Assessment: 66 yo female on IV heparin for afib, hx DVT, heparin level remains sub-therapeutic this AM after multiple rate increases. No issues per RN.  On Apixaban PTA (last dose 1/25).  Goal of Therapy:  Heparin level 0.3-0.7 units/ml Monitor platelets by anticoagulation protocol: Yes   Plan:  Increase IV heparin to 1800 units/hr Recheck heparin level in 8 hrs Daily heparin level and CBC F/u plans for oral anticoagulation eventually.  Uvaldo Rising, BCPS  Clinical Pharmacist Pager [phone]  07/23/2016 1:16 PM

## 2016-07-23 NOTE — Progress Notes (Signed)
[facility] Progress Note    Assessment/ Plan:   1. Renal failure - dehydration from diarrheal illness/ diuretics +/- ATN. Last creat here was 1.0 in 2016. Pt is not long-term HD candidate. Getting short-term HD while awaiting to see if renal fxn recovers.  - No signs of recovery -->very poor prognosis and will plan on HD today.  - attempt to remove 2L as tolerated. - I spoke with the son yesterday and will call her primary PA Alvester Chou in [place] [phone] this am to determine if there was progressive CKD.  If she has had progressive renal failure then withdrawal of HD is appropriate as she's not a long term dialysis candidate. - Regardless if there is no sign of recovery within the next week would consider withdrawal of HD; palliative consult may be indicated as well. 2. Uremia - improved, asterixis resolved, sp HD Sat 11/27. 3. HCAP - on IV abx, RLL infiltrate 4. Volume - doubt CHF clinically, CXR w vasc crowding 5. Chest pain - per primary, on IV hep empirically 6. Abd pain - lipase ^605, poss acute pancreatitis 7. UTI - urine cx+ (E.Coli Sens Rocephin). 8. 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 Debility/ bedbound x 4 yrs 10. Hyperkalemia - renal diet, HD 11. Chronic wound left LE 12. EOL - would address code status, she is full code now but was DNR on her last admit here in Sept 2017  Subjective:   Intubated 07/21/16  for respiratory distress. Pressures drop w/ sedation req uptitration of pressors.  Currently on Levo 49mg   Objective:   BP (!) 119/56   Pulse 67   Temp 97.2 F (36.2 C) (Core (Comment))   Resp (!) 24   Ht _0  (1.6 m)   Wt (!) 171 kg (377 lb)   SpO2 100%   BMI 66.78 kg/m   Intake/Output Summary (Last 24 hours) at 07/23/16 0743 Last data filed at 07/23/16 0600  Gross per 24 hour  Intake          4053.91 ml  Output                0 ml  Net          4053.91 ml   Weight change: 6.35 kg (14 lb)  Physical Exam: Morbidly  obese Chest clear intubated RRR no mrg Abd obese, nontender, no bs Ext chronic skin chgs, edema present esp in hands and 1+ in LE RIJ temp catheter in place   Imaging: Dg Chest Port 1 View  Result Date: 07/23/2016 CLINICAL DATA:  Intubated patient, CHF, respiratory failure, dialysis dependent renal failure, former smoker EXAM: PORTABLE CHEST 1 VIEW COMPARISON:  Portable chest x-ray of July 22, 2016 FINDINGS: The lungs are reasonably well inflated. Confluent alveolar opacities persist in the mid and lower lungs. There is partial obscuration of the hemidiaphragms which is stable. The cardiac silhouette remains enlarged. The pulmonary vascularity remains engorged. The endotracheal tube tip lies 2.7 cm above the carina. The esophagogastric tube tip in proximal port project below the inferior margin of the image. The right internal jugular venous catheter tip projects over the midportion of the SVC. There is dense calcification in the wall of the aortic arch. IMPRESSION: CHF with pulmonary interstitial and alveolar edema. Certainly superimposed pneumonia is not excluded. Overall there has not been significant interval change in the appearance of the chest since yesterday's study. Electronically Signed   By: David  JMartiniqueM.D.   On:  07/23/2016 07:08   Portable Chest Xray  Result Date: 07/22/2016 CLINICAL DATA:  Sleep apnea.  Shortness of breath. EXAM: PORTABLE CHEST 1 VIEW COMPARISON:  07/21/2016. FINDINGS: Endotracheal tube, NG tube in stable position. Esophageal line noted with tip at gastroesophageal junction. Right IJ line in stable position. Cardiomegaly with diffuse bilateral pulmonary infiltrates again noted consistent congestive heart failure. Bilateral pneumonia cannot be excluded . Low lung volumes with basilar atelectasis. Small bilateral pleural effusions progressed from prior exam . No pneumothorax. Surgical clips right chest . IMPRESSION: 1. Endotracheal tube and NG tube noted. Esophageal  line noted with its tip at the gastroesophageal junction, clinical correlation suggested. Right IJ line in stable position. 2. Cardiomegaly with diffuse bilateral pulmonary infiltrates again noted consistent congestive heart failure. Bilateral pneumonia cannot be excluded. Bilateral small pleural effusions, progressed from prior exam . Electronically Signed   By: Marcello Moores  Register   On: 07/22/2016 06:52   Portable Chest Xray  Result Date: 07/21/2016 CLINICAL DATA:  66 year old female with lethargy, now intubated. Initial encounter. EXAM: PORTABLE CHEST 1 VIEW COMPARISON:  1040 hours today and earlier. FINDINGS: Portable AP supine view at 1149 hours. Endotracheal tube tip is in good position about 2.6 cm above the carina. Enteric tube has been placed and courses to the left abdomen, tip not included. Stable right IJ central line. Stable cardiomegaly and mediastinal contours. Mildly improved retrocardiac ventilation but continued obscuration of the left hemidiaphragm. Mildly improved right lung base ventilation with residual patchy opacity. Pulmonary vascularity is mildly regressed. No pneumothorax. No definite effusion. Postoperative changes to the right axilla and chest wall. IMPRESSION: 1. Intubated, ET tube in good position. Enteric tube placed and courses to the abdomen with tip not included. 2. Mildly improved bibasilar ventilation with residual retrocardiac collapse or consolidation. 3. Cardiomegaly. Interval decreased pulmonary vascularity with no overt edema. Electronically Signed   By: Genevie Ann M.D.   On: 07/21/2016 12:00   Dg Chest Port 1 View  Result Date: 07/21/2016 CLINICAL DATA:  Lethargic.  History of hypertension. EXAM: PORTABLE CHEST 1 VIEW COMPARISON:  Chest radiograph 07/17/2016 and earlier priors. Chest CT 03/10/2015. FINDINGS: Right IJ hemodialysis catheters in stable position with the distal tip projecting over the mid SVC. Cardiac silhouette is enlarged. There is diffuse pulmonary  vascular congestion. Perihilar and bibasilar opacities are present. Overall, aeration of the lungs appear slightly decreased compared to 07/17/2016. No visible pneumothorax. IMPRESSION: Cardiomegaly with diffuse pulmonary vascular congestion. Perihilar and bibasilar opacities are present. Considerations include pulmonary edema or pneumonia, with scattered areas of atelectasis possible. Electronically Signed   By: Curlene Dolphin M.D.   On: 07/21/2016 11:00    Labs: BMET  Recent Labs Lab 07/17/16 1050 07/18/16 0954 07/19/16 0335 07/20/16 0430 07/21/16 1000 07/22/16 0322 07/23/16 0206  NA 134* 134* 134* 134* 131* 129* 126*  K 6.0* 5.3* 3.7 3.7 3.9 3.2* 3.2*  CL 99* 103 100* 102 97* 96* 91*  CO2 14* 14* _0 19* 18*  GLUCOSE 77 96 90 70 83 93 107*  BUN 190* 191* 94* 91* 65* 65* 69*  CREATININE 18.29* 19.29* 11.11* 10.36* 7.99* 8.10* 8.25*  CALCIUM 6.9* 7.2* 7.2* 7.2* 8.0* 7.7* 7.5*  PHOS 8.3* 7.5*  --  6.8* 6.2* 5.1* 4.4   CBC  Recent Labs Lab 06/29/2016 2311 07/18/16 0954  07/21/16 0813 07/21/16 1000 07/22/16 0322 07/23/16 0206  WBC 13.9* 11.2*  < > 11.5* 9.3 12.8* 16.4*  NEUTROABS 11.7* 8.7*  --   --  7.0  --   --  HGB 10.1* 9.4*  < > 8.0* 8.4* 7.4* 7.9*  HCT 30.9* 29.0*  < > 26.1* 26.3* 23.2* 24.2*  MCV 97.5 97.3  < > 99.6 99.6 97.1 97.6  PLT 211 210  < > 125* 115* 143* 138*  < > = values in this interval not displayed.  Medications:    . aspirin EC  81 mg Oral Daily  . chlorhexidine gluconate (MEDLINE KIT)  15 mL Mouth Rinse BID  . feeding supplement (PRO-STAT SUGAR FREE 64)  30 mL Oral BID  . fluconazole (DIFLUCAN) IV  50 mg Intravenous Q24H  . mouth rinse  15 mL Mouth Rinse 10 times per day  . nystatin   Topical BID  . pantoprazole (PROTONIX) IV  40 mg Intravenous Q24H      Otelia Santee, MD 07/23/2016, 7:43 AM

## 2016-07-23 NOTE — Progress Notes (Signed)
PULMONARY / CRITICAL CARE MEDICINE   Name: [name] MRN: [id number] DOB: 01/25/1951    ADMISSION DATE:  06/28/2016 CONSULTATION DATE:  07/21/2016  REFERRING MD:  Malachy Moan. Grandville Silos, MD Internal Medicine  CHIEF COMPLAINT:  Acute Respiratory Failure  HISTORY OF PRESENT ILLNESS:   66yo woman with history of morbid obesity bedbound for several months, DVT on chronic anticoagulation who hasn't taken her anticoagulation approximately 6 days prior to admission, hypertension, depression, anxiety, atrial fibrillation , obstructive sleep apnea, diastolic heart failure presented to the ED with generalized weakness, chest pain, shortness of breath and abdominal pain. Patient noted to be in acute renal failure with a creatinine of 19.66, BUN of 190, potassium of 6.4, bicarbonate of 13. Patient also noted with significant hyperkalemia, probable community-acquired pneumonia, chest pain, probable UTI, and acute pancreatitis, and metabolic acidosis. She was emergently dialyzed 1/27/and 1/28 with some improvement. She was noted to be less alert the morning of 07/21/2016. Of note she does get several sedating medications. ABG indicated ph of 7.177, CO2 of 66 and PO2 of 67. CCM was emergently consulted for stat intubation with transfer of care as pt. Was transferred to ICU on life support.  SUBJECTIVE:  Many issues overnight. Hypotension, Levophed started. Blood sugars dropping so D5 changed to D10. Hypothermia, bair hugger placed on patient. Hypokalemic at 3.2, but given significant renal failure she was given Mg supplementation. CCM had discussion with patient's son and she is now DNR.   VITAL SIGNS: BP 121/69   Pulse 83   Temp (!) 96.4 F (35.8 C) (Core (Comment))   Resp (!) 21   Ht 5\' 3"  (1.6 m)   Wt (!) 171 kg (377 lb)   SpO2 99%   BMI 66.78 kg/m   VENTILATOR SETTINGS: Vent Mode: PRVC FiO2 (%):  [40 %] 40 % Set Rate:  [24 bmp] 24 bmp Vt Set:  [420 mL] 420 mL PEEP:  [5 cmH20] 5 cmH20 Plateau  Pressure:  [21 cmH20-23 cmH20] 22 cmH20FiO2: 100% Rate : 24 TV 420 Peep : 5  INTAKE / OUTPUT: I/O last 3 completed shifts: In: 6392.1 [I.V.:5107.1; NG/GT:60; IV Piggyback:1225] Out: 30 [Urine:30]  PHYSICAL EXAMINATION: General: Obese, intubated sedated female supine in air bed. Neuro: More awake and interactive today, moving ext HEENT: MM pink and moist, UTA for JVD , Oral ETT in place  Cardiovascular: RRR, no murmur, rub or gallop Lungs:  Diffuse crackles and distant BS Abdomen:  Obese, Non-tender, BS positive but quiet Musculoskeletal: + Foot drop, Pt. Has been bed bound x 4 years. Skin: Chronic skin changes,.  LABS:  BMET  Recent Labs Lab 07/21/16 1000 07/22/16 0322 07/23/16 0206  NA 131* 129* 126*  K 3.9 3.2* 3.2*  CL 97* 96* 91*  CO2 24 19* 18*  BUN 65* 65* 69*  CREATININE 7.99* 8.10* 8.25*  GLUCOSE 83 93 107*    Electrolytes  Recent Labs Lab 07/21/16 1000 07/22/16 0322 07/23/16 0206  CALCIUM 8.0* 7.7* 7.5*  MG  --  1.4* 1.5*  PHOS 6.2* 5.1* 4.4    CBC  Recent Labs Lab 07/21/16 1000 07/22/16 0322 07/23/16 0206  WBC 9.3 12.8* 16.4*  HGB 8.4* 7.4* 7.9*  HCT 26.3* 23.2* 24.2*  PLT 115* 143* 138*    Coag's  Recent Labs Lab 07/17/16 0100 07/18/16 2323 07/19/16 1230  APTT 45* 123* 50*  INR 2.27  --   --     Sepsis Markers  Recent Labs Lab 07/17/16 0047 07/21/16 1530  LATICACIDVEN 1.75 1.6  ABG  Recent Labs Lab 07/21/16 1032 07/21/16 2055 07/23/16 0335  PHART 7.177* 7.355 7.299*  PCO2ART 66.6* 38.5 38.8  PO2ART 67.3* 318.0* 95.9    Liver Enzymes  Recent Labs Lab 07/17/16 0037  07/19/16 0335 07/20/16 0430 07/21/16 1000 07/22/16 0322  AST 15  --  13*  --   --  15  ALT 5*  --  8*  --   --  8*  ALKPHOS 100  --  95  --   --  92  BILITOT 0.9  --  0.6  --   --  0.5  ALBUMIN 1.9*  < > 1.5* 1.4* 1.5* 1.4*  < > = values in this interval not displayed.  Cardiac Enzymes  Recent Labs Lab 07/17/16 1025 07/21/16 1555  07/22/16 0322  TROPONINI <0.03 <0.03 <0.03    Glucose  Recent Labs Lab 07/22/16 1141 07/22/16 1613 07/22/16 2021 07/23/16 0026 07/23/16 0425 07/23/16 0826  GLUCAP 71 102* 94 92 118* 100*    Imaging Dg Chest Port 1 View  Result Date: 07/23/2016 CLINICAL DATA:  Intubated patient, CHF, respiratory failure, dialysis dependent renal failure, former smoker EXAM: PORTABLE CHEST 1 VIEW COMPARISON:  Portable chest x-ray of July 22, 2016 FINDINGS: The lungs are reasonably well inflated. Confluent alveolar opacities persist in the mid and lower lungs. There is partial obscuration of the hemidiaphragms which is stable. The cardiac silhouette remains enlarged. The pulmonary vascularity remains engorged. The endotracheal tube tip lies 2.7 cm above the carina. The esophagogastric tube tip in proximal port project below the inferior margin of the image. The right internal jugular venous catheter tip projects over the midportion of the SVC. There is dense calcification in the wall of the aortic arch. IMPRESSION: CHF with pulmonary interstitial and alveolar edema. Certainly superimposed pneumonia is not excluded. Overall there has not been significant interval change in the appearance of the chest since yesterday's study. Electronically Signed   By: David  Martinique M.D.   On: 07/23/2016 07:08     STUDIES:  07/17/2016 Echo: EF: 55-60%, MV: Calcified Annulus, LA: Mildly dilated, RA: Mildly Dilated, PAP: 41 mm HG, Small pericardial effusion.  CT Abdomen:07/17/2016 Probable acute pancreatitis affecting the tail of pancreatitis, mild, no findings of advanced disease. Bilateral pleural effusions with dependent atelectasis and or pneumonia. Cardiomegaly and small pericardial effusion.  VQ Scan>>07/18/16  Low probability pulmonary thromboembolism.   Renal US>> 07/17/16 Atrophic andechogenic appearance of the right kidney. Nonvisualizationof the  left kidney and urinary bladder.  CULTURES: 1/26/>> Urine  Culture >> E Coli 1/26>> MRSA Negative 1/30 Urinalysis  ANTIBIOTICS: Rocephin 1/26>>2/1 Vancomycin 2/1>>> Zosyn 2/1>>> Diflucan 1/30>>  SIGNIFICANT EVENTS: HD Started>>07/18/2016 Intubation>> 07/21/2016  LINES/TUBES: ETT>> 07/21/2016>>> HD Cath>> 07/18/16>>>  DISCUSSION: Acute Hypoxic, hypercarbic Respiratory Failure in setting of OSA/ ARF/ and suspected over sedation due to poor renal clearance. Requiring emergent intubation 1/30. She is a short term HD candidate x 2 weeks to see if ARF will turn around.   ASSESSMENT / PLAN:  PULMONARY A: Acute Hypercarbic, hypoxemic Respiratory Failure in setting of ARF, OSA, and questionable CAP P:   Wean FiO2 as able Wake up assessment daily ABX as above Hold weaning given volume status Once enough volume is taken off and patient is able to wean then will be a one way extubation Spoke with family, no trach/peg, once ready one way extubation, if not ready then comfort, DNR now, will give a few days to trial patient.  CARDIOVASCULAR A:  Hypotension EF 55-60 % per  echo VQ scan negative for PE Hx HTN Troponin negative 07/17/2016 PAF on heparin P:  Levophed, wean as able Heparin gtt  Hold metoprolol MAP goal > 65 Telemetry Monitoring  RENAL A:   Acute RF with metabolic acidosis on admission, in setting of dehydration from diarrheal illness/diuretics/ATN? Atrophic andechogenic appearance of the right kidney. P:   HD per the renal team, but not long term HD candidate  HD today with 4-5 liters negative volume, may increase levophed to take volume off. Replete Lytes as needed BMET daily Avoid nephrotoxic drugs Monitor for return of urine output  GASTROINTESTINAL A:   Acute Pancreatitis  Extreme Morbid Obesity P:   NPO for now SUP TF per nutrition.  HEMATOLOGIC A:   Anemia Leukocytosis  P:  CBC daily Monitor for signs of bleeding Transfuse for HGB of < 7 Heparin gtt  INFECTIOUS A:   Leukocytosis Afebrile but  hypothermia Ecoli UTI Febrile and WBC increase Wound Left ankle P:   Follow up cultures  D/C rocephin Vanc/zosyn started 2/1 Trend fever and WBC daily Trend CMET  Wound care consulted  ENDOCRINE A:   Hypoglycemia- cortisol nml P:   CBG's Q 4 TF ISS  NEUROLOGIC A:   Acute encephalopathy in setting of ARF/uremia, narcotics P:   RASS goal: -1 Lights on during the day Reduce sedation as able  Spoke with family 1/31, DNR, no trach/peg, one way extubation after volume negative, if fails then comfort.  If unsafe to extubate in a few days then comfort.  The patient is critically ill with multiple organ systems failure and requires high complexity decision making for assessment and support, frequent evaluation and titration of therapies, application of advanced monitoring technologies and extensive interpretation of multiple databases.   Critical Care Time devoted to patient care services described in this note is  35  Minutes. This time reflects time of care of this signee Dr Jennet Maduro. This critical care time does not reflect procedure time, or teaching time or supervisory time of PA/NP/Med student/Med Resident etc but could involve care discussion time.  Rush Farmer, M.D. [facility] Pulmonary/Critical Care Medicine. Pager: [phone]. After hours pager: [phone].  07/23/2016, 9:10 AM

## 2016-07-23 DEATH — deceased

## 2016-07-24 ENCOUNTER — Inpatient Hospital Stay (HOSPITAL_COMMUNITY): Payer: Medicare Other

## 2016-07-24 LAB — MAGNESIUM: MAGNESIUM: 1.5 mg/dL — AB (ref 1.7–2.4)

## 2016-07-24 LAB — BASIC METABOLIC PANEL
Anion gap: 12 (ref 5–15)
BUN: 33 mg/dL — AB (ref 6–20)
CALCIUM: 7.6 mg/dL — AB (ref 8.9–10.3)
CO2: 21 mmol/L — ABNORMAL LOW (ref 22–32)
CREATININE: 4.78 mg/dL — AB (ref 0.44–1.00)
Chloride: 91 mmol/L — ABNORMAL LOW (ref 101–111)
GFR calc Af Amer: 10 mL/min — ABNORMAL LOW (ref >60)
GFR, EST NON AFRICAN AMERICAN: 9 mL/min — AB (ref >60)
Glucose, Bld: 144 mg/dL — ABNORMAL HIGH (ref 65–99)
POTASSIUM: 3.1 mmol/L — AB (ref 3.5–5.1)
SODIUM: 124 mmol/L — AB (ref 135–145)

## 2016-07-24 LAB — BLOOD GAS, ARTERIAL
Acid-base deficit: 1.9 mmol/L (ref 0.0–2.0)
BICARBONATE: 22.7 mmol/L (ref 20.0–28.0)
DRAWN BY: 44956
FIO2: 40
MECHVT: 420 mL
O2 Saturation: 98.6 %
PATIENT TEMPERATURE: 99.3
PCO2 ART: 41.1 mmHg (ref 32.0–48.0)
PEEP: 5 cmH2O
PO2 ART: 122 mmHg — AB (ref 83.0–108.0)
RATE: 24 resp/min
pH, Arterial: 7.362 (ref 7.350–7.450)

## 2016-07-24 LAB — CBC
HCT: 23.1 % — ABNORMAL LOW (ref 36.0–46.0)
Hemoglobin: 7.4 g/dL — ABNORMAL LOW (ref 12.0–15.0)
MCH: 31.4 pg (ref 26.0–34.0)
MCHC: 32 g/dL (ref 30.0–36.0)
MCV: 97.9 fL (ref 78.0–100.0)
PLATELETS: 93 10*3/uL — AB (ref 150–400)
RBC: 2.36 MIL/uL — AB (ref 3.87–5.11)
RDW: 16.2 % — AB (ref 11.5–15.5)
WBC: 16.1 10*3/uL — ABNORMAL HIGH (ref 4.0–10.5)

## 2016-07-24 LAB — PHOSPHORUS: Phosphorus: 3.1 mg/dL (ref 2.5–4.6)

## 2016-07-24 LAB — GLUCOSE, CAPILLARY
GLUCOSE-CAPILLARY: 132 mg/dL — AB (ref 65–99)
GLUCOSE-CAPILLARY: 132 mg/dL — AB (ref 65–99)
GLUCOSE-CAPILLARY: 147 mg/dL — AB (ref 65–99)
GLUCOSE-CAPILLARY: 153 mg/dL — AB (ref 65–99)
Glucose-Capillary: 144 mg/dL — ABNORMAL HIGH (ref 65–99)
Glucose-Capillary: 141 mg/dL — ABNORMAL HIGH (ref 65–99)

## 2016-07-24 LAB — HEPARIN LEVEL (UNFRACTIONATED): Heparin Unfractionated: 0.2 IU/mL — ABNORMAL LOW (ref 0.30–0.70)

## 2016-07-24 MED ORDER — SODIUM CHLORIDE 0.9% FLUSH
10.0000 mL | Freq: Two times a day (BID) | INTRAVENOUS | Status: DC
  Administered 2016-07-24: 10 mL

## 2016-07-24 MED ORDER — POTASSIUM CHLORIDE 20 MEQ/15ML (10%) PO SOLN
40.0000 meq | Freq: Once | ORAL | Status: AC
  Administered 2016-07-24: 40 meq via ORAL
  Filled 2016-07-24: qty 30

## 2016-07-24 MED ORDER — CHLORHEXIDINE GLUCONATE CLOTH 2 % EX PADS
6.0000 | MEDICATED_PAD | Freq: Every day | CUTANEOUS | Status: DC
  Administered 2016-07-25: 6 via TOPICAL

## 2016-07-24 MED ORDER — SODIUM CHLORIDE 0.9% FLUSH: 10.0000 mL | INTRAVENOUS | Status: DC | PRN

## 2016-07-24 MED ORDER — MAGNESIUM SULFATE 2 GM/50ML IV SOLN
2.0000 g | Freq: Once | INTRAVENOUS | Status: AC
  Administered 2016-07-24: 2 g via INTRAVENOUS
  Filled 2016-07-24: qty 50

## 2016-07-24 NOTE — Progress Notes (Signed)
Patient family member, Ernestina Patches, stated, "If anything happens to [name], can you all make sure to get a thumb print for the family as a memory of her?"   This RN told family that she would pass it on to next nurse and write a note in chart as requested.

## 2016-07-24 NOTE — Progress Notes (Signed)
[facility] Progress Note    Assessment/ Plan:   1. Renal failure - dehydration from diarrheal illness/ diuretics +/- ATN. Last creat here was 1.0 in 2016. Pt is not long-term HD candidate. Getting short-term HD while awaiting to see if renal fxn recovers.  - No signs of recovery -->very poor prognosis and last HD yesterday.   - attempt to remove 2L as tolerated --> only able to get a net of -1253.  - pt now on 32mg of Levophed (will be very difficult to remove fluid but will attempt tomorrow) - I spoke with the son 2/1; able to contact  her primary PA [place]Alvester [place]   -  She had nl renal function 6 mths ago but she's still not a long term dialysis candidate. Certainly will not effect or improve QOL. - Regardless if there is no sign of recovery within the next week would consider withdrawal of HD; palliative consult may be indicated as well. 2. Uremia - improved, asterixis resolved, sp HD Sat 11/27. 3. HCAP - on IV abx, RLL infiltrate 4. Volume - doubt CHF clinically, CXR w vasc crowding 5. Chest pain - per primary, on IV hep empirically 6. Abd pain - lipase ^605, poss acute pancreatitis 7. UTI - urine cx+ (E.Coli Sens Rocephin). 8. 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 Debility/ bedbound x 4 yrs 10. Hyperkalemia - renal diet, HD 11. Chronic wound left LE 12. EOL - would address code status, she is full code now but was DNR on her last admit here in Sept 2017  Subjective:   Intubated 07/21/16 for respiratory distress. Pressures drop w/ sedation req uptitration of pressors.  Currently on Levo 257m   Objective:   BP 126/63   Pulse 77   Temp 98.6 F (37 C) (Core (Comment))   Resp (!) 24   Ht '5\' 3"'  (1.6 m)   Wt (!) 169.6 kg (374 lb)   SpO2 97%   BMI 66.25 kg/m   Intake/Output Summary (Last 24 hours) at 07/24/16 0751 Last data filed at 07/24/16 0700  Gross per 24 hour  Intake          5562.67 ml  Output             1316 ml  Net           4246.67 ml   Weight change: -1.361 kg (-3 lb)  Physical Exam: Morbidly obese Chest clear intubated RRR no mrg Abd obese, nontender, no bs Ext chronic skin chgs, edema present esp in hands and 1-2+ in ext RIJ temp catheter in place   Imaging: Dg Chest Port 1 View  Result Date: 07/24/2016 CLINICAL DATA:  Intubation. EXAM: PORTABLE CHEST 1 VIEW COMPARISON:  07/23/2016. FINDINGS: Endotracheal tube, NG tube, esophageal probe, right IJ line in stable position. Cardiomegaly with diffuse bilateral pulmonary infiltrates again noted. Findings consistent CHF. Low lung volumes. Small left pleural effusion . No pneumothorax . Surgical clips right chest. IMPRESSION: 1. Lines and tubes in stable position. 2. Cardiomegaly with persistent bilateral pulmonary alveolar infiltrates consistent with congestive heart failure pulmonary edema. Bilateral pneumonia cannot be excluded. Similar findings noted on prior exam. Small left pleural effusion. 3. Low lung volumes. Electronically Signed   By: ThMarcello MooresRegister   On: 07/24/2016 06:42   Dg Chest Port 1 View  Result Date: 07/23/2016 CLINICAL DATA:  Intubated patient, CHF, respiratory failure, dialysis dependent renal failure, former smoker EXAM: PORTABLE CHEST 1 VIEW COMPARISON:  Portable chest x-ray of July 22, 2016 FINDINGS: The lungs are reasonably well inflated. Confluent alveolar opacities persist in the mid and lower lungs. There is partial obscuration of the hemidiaphragms which is stable. The cardiac silhouette remains enlarged. The pulmonary vascularity remains engorged. The endotracheal tube tip lies 2.7 cm above the carina. The esophagogastric tube tip in proximal port project below the inferior margin of the image. The right internal jugular venous catheter tip projects over the midportion of the SVC. There is dense calcification in the wall of the aortic arch. IMPRESSION: CHF with pulmonary interstitial and alveolar edema. Certainly superimposed  pneumonia is not excluded. Overall there has not been significant interval change in the appearance of the chest since yesterday's study. Electronically Signed   By: David  Martinique M.D.   On: 07/23/2016 07:08    Labs: BMET  Recent Labs Lab 07/17/16 1050 07/18/16 0954 07/19/16 0335 07/20/16 0430 07/21/16 1000 07/22/16 0322 07/23/16 0206 07/23/16 1206  NA 134* 134* 134* 134* 131* 129* 126*  --   K 6.0* 5.3* 3.7 3.7 3.9 3.2* 3.2*  --   CL 99* 103 100* 102 97* 96* 91*  --   CO2 14* 14* '23 22 24 ' 19* 18*  --   GLUCOSE 77 96 90 70 83 93 107*  --   BUN 190* 191* 94* 91* 65* 65* 69*  --   CREATININE 18.29* 19.29* 11.11* 10.36* 7.99* 8.10* 8.25*  --   CALCIUM 6.9* 7.2* 7.2* 7.2* 8.0* 7.7* 7.5*  --   PHOS 8.3* 7.5*  --  6.8* 6.2* 5.1* 4.4 4.7*   CBC  Recent Labs Lab 07/18/16 0954  07/21/16 0813 07/21/16 1000 07/22/16 0322 07/23/16 0206  WBC 11.2*  < > 11.5* 9.3 12.8* 16.4*  NEUTROABS 8.7*  --   --  7.0  --   --   HGB 9.4*  < > 8.0* 8.4* 7.4* 7.9*  HCT 29.0*  < > 26.1* 26.3* 23.2* 24.2*  MCV 97.3  < > 99.6 99.6 97.1 97.6  PLT 210  < > 125* 115* 143* 138*  < > = values in this interval not displayed.  Medications:    . aspirin EC  81 mg Oral Daily  . chlorhexidine gluconate (MEDLINE KIT)  15 mL Mouth Rinse BID  . feeding supplement (PRO-STAT SUGAR FREE 64)  60 mL Per Tube TID  . feeding supplement (VITAL HIGH PROTEIN)  1,000 mL Per Tube Q24H  . fluconazole (DIFLUCAN) IV  50 mg Intravenous Q24H  . mouth rinse  15 mL Mouth Rinse 10 times per day  . nystatin   Topical BID  . pantoprazole (PROTONIX) IV  40 mg Intravenous Q24H  . piperacillin-tazobactam (ZOSYN)  IV  3.375 g Intravenous Q12H      Otelia Santee, MD 07/24/2016, 7:51 AM

## 2016-07-24 NOTE — Progress Notes (Signed)
Upon assessment, patient found to have blood in mouth during oral suctioning and mouth care. Mouth cleaned and suctioned. Patient gripping down on the tube during this time. Will continue to monitor patient.

## 2016-07-24 NOTE — Progress Notes (Signed)
Called Dr. Nelda Marseille so son and daughter could speak with him about plan of care. All questions answered. Plan to attempt HD in am. Will continue to give support to family.

## 2016-07-24 NOTE — Progress Notes (Signed)
Patient still bleeding from mouth. During oral suctioning, a blood clot was found. Oletta Darter, MD called and notified. Verbal orders to continue to monitor patient's blood pressure, Hemoglobin as ordered, and watch for profound bleeding. Continue Heparin gtt.

## 2016-07-24 NOTE — Progress Notes (Signed)
eLink Physician-Brief Progress Note Patient Name: [name] DOB: Aug 14, 1950 MRN: [id number]   Date of Service  07/24/2016  HPI/Events of Note  Blood clot found in back of throat. BP = 114/54 and HR = 65. Last Hgb = 7.4.  eICU Interventions  Continue Heparin IV infusion for present unless profuse bleeding, BP drop or Hgb drop.     Intervention Category Intermediate Interventions: Other:  Adiba Fargnoli Cornelia Copa 07/24/2016, 9:04 PM

## 2016-07-24 NOTE — Progress Notes (Signed)
ANTICOAGULATION CONSULT NOTE - Follow Up Consult  Pharmacy Consult for Heparin (apixaban on hold) Indication: Hx DVT/Afib  Allergies  Allergen Reactions  . Effexor [Venlafaxine Hydrochloride] Anxiety    Made her want to kill herself   Patient Measurements: Height: 5\' 3"  (160 cm) Weight: (!) 374 lb (169.6 kg) IBW/kg (Calculated) : 52.4 Vital Signs: Temp: 97.4 F (36.3 C) (02/02 1116) Temp Source: Oral (02/02 1116) BP: 103/44 (02/02 1100) Pulse Rate: 66 (02/02 1100)  Labs:  Recent Labs  07/21/16 1555  07/22/16 0322  07/23/16 0206 07/23/16 1206 07/24/16 0758  HGB  --   < > 7.4*  --  7.9*  --  7.4*  HCT  --   --  23.2*  --  24.2*  --  23.1*  PLT  --   --  143*  --  138*  --  PENDING  HEPARINUNFRC  --   --  0.22*  < > 0.19* 0.20* 0.20*  CREATININE  --   --  8.10*  --  8.25*  --  4.78*  TROPONINI <0.03  --  <0.03  --   --   --   --   < > = values in this interval not displayed.  Estimated Creatinine Clearance: 17.9 mL/min (by C-G formula based on SCr of 4.78 mg/dL (H)).   Assessment: 65 yo female on IV heparin for afib, hx DVT, heparin level remains sub-therapeutic this AM after multiple rate increases.  Heparin drip 2000 uts/hr HL 0.2 No issues per RN.  On Apixaban PTA (last dose 1/25).  Goal of Therapy:  Heparin level 0.3-0.7 units/ml Monitor platelets by anticoagulation protocol: Yes   Plan:  Increase IV heparin to 2200 units/hr Daily heparin level and CBC   Bonnita Nasuti Pharm.D. CPP, BCPS Clinical Pharmacist [phone] 07/24/2016 11:27 AM

## 2016-07-24 NOTE — Progress Notes (Signed)
PULMONARY / CRITICAL CARE MEDICINE   Name: [name] MRN: [id number] DOB: [birth year]    ADMISSION DATE:  07/20/2016 CONSULTATION DATE:  07/21/2016  REFERRING MD:  Malachy Moan. Grandville Silos, MD Internal Medicine  CHIEF COMPLAINT:  Acute Respiratory Failure  HISTORY OF PRESENT ILLNESS:   66yo woman with history of morbid obesity bedbound for several months, DVT on chronic anticoagulation who hasn't taken her anticoagulation approximately 6 days prior to admission, hypertension, depression, anxiety, atrial fibrillation , obstructive sleep apnea, diastolic heart failure presented to the ED with generalized weakness, chest pain, shortness of breath and abdominal pain. Patient noted to be in acute renal failure with a creatinine of 19.66, BUN of 190, potassium of 6.4, bicarbonate of 13. Patient also noted with significant hyperkalemia, probable community-acquired pneumonia, chest pain, probable UTI, and acute pancreatitis, and metabolic acidosis. She was emergently dialyzed 1/27/and 1/28 with some improvement. She was noted to be less alert the morning of 07/21/2016. Of note she does get several sedating medications. ABG indicated ph of 7.177, CO2 of 66 and PO2 of 67. CCM was emergently consulted for stat intubation with transfer of care as pt. Was transferred to ICU on life support.  SUBJECTIVE:  BP dropped significantly with dialysis yesterday, was only able to take 1.5 liter off then gave back 500 ml.  VITAL SIGNS: BP (!) 116/56   Pulse 88   Temp 98.1 F (36.7 C) (Oral)   Resp (!) 24   Ht 5\' 3"  (1.6 m)   Wt (!) 169.6 kg (374 lb)   SpO2 97%   BMI 66.25 kg/m   VENTILATOR SETTINGS: Vent Mode: PRVC FiO2 (%):  [40 %] 40 % Set Rate:  [24 bmp] 24 bmp Vt Set:  [420 mL] 420 mL PEEP:  [5 cmH20] 5 cmH20 Plateau Pressure:  [22 cmH20-25 cmH20] 24 cmH20FiO2: 100%  INTAKE / OUTPUT: I/O last 3 completed shifts: In: 7621.9 [I.V.:7027.4; NG/GT:444.6; IV Piggyback:150] Out: [id number] [Urine:63;  Other:1253]  PHYSICAL EXAMINATION: General: Obese, intubated sedated female supine in air bed but arousable this AM Neuro: Awake and interactive today, moving ext HEENT: MM pink and moist, UTA for JVD , Oral ETT in place  Cardiovascular: RRR, no murmur, rub or gallop Lungs:  Diffuse crackles and distant BS Abdomen:  Obese, Non-tender, BS positive but quiet Musculoskeletal: + Foot drop, Pt. Has been bed bound x 4 years. Skin: Chronic skin changes,.  LABS:  BMET  Recent Labs Lab 07/22/16 0322 07/23/16 0206 07/24/16 0758  NA 129* 126* 124*  K 3.2* 3.2* 3.1*  CL 96* 91* 91*  CO2 19* 18* 21*  BUN 65* 69* 33*  CREATININE 8.10* 8.25* 4.78*  GLUCOSE 93 107* 144*    Electrolytes  Recent Labs Lab 07/22/16 0322 07/23/16 0206 07/23/16 1206 07/24/16 0758  CALCIUM 7.7* 7.5*  --  7.6*  MG 1.4* 1.5* 1.4* 1.5*  PHOS 5.1* 4.4 4.7* 3.1    CBC  Recent Labs Lab 07/22/16 0322 07/23/16 0206 07/24/16 0758  WBC 12.8* 16.4* 16.1*  HGB 7.4* 7.9* 7.4*  HCT 23.2* 24.2* 23.1*  PLT 143* 138* PENDING    Coag's  Recent Labs Lab 07/18/16 2323 07/19/16 1230  APTT 123* 50*    Sepsis Markers  Recent Labs Lab 07/21/16 1530  LATICACIDVEN 1.6    ABG  Recent Labs Lab 07/21/16 2055 07/23/16 0335 07/24/16 0352  PHART 7.355 7.299* 7.362  PCO2ART 38.5 38.8 41.1  PO2ART 318.0* 95.9 122*    Liver Enzymes  Recent Labs Lab 07/19/16 0335 07/20/16  0430 07/21/16 1000 07/22/16 0322  AST 13*  --   --  15  ALT 8*  --   --  8*  ALKPHOS 95  --   --  92  BILITOT 0.6  --   --  0.5  ALBUMIN 1.5* 1.4* 1.5* 1.4*    Cardiac Enzymes  Recent Labs Lab 07/17/16 1025 07/21/16 1555 07/22/16 0322  TROPONINI <0.03 <0.03 <0.03    Glucose  Recent Labs Lab 07/23/16 1232 07/23/16 1651 07/23/16 2050 07/24/16 0030 07/24/16 0432 07/24/16 0809  GLUCAP 100* 107* 148* 153* 147* 144*    Imaging Dg Chest Port 1 View  Result Date: 07/24/2016 CLINICAL DATA:  Intubation. EXAM:  PORTABLE CHEST 1 VIEW COMPARISON:  07/23/2016. FINDINGS: Endotracheal tube, NG tube, esophageal probe, right IJ line in stable position. Cardiomegaly with diffuse bilateral pulmonary infiltrates again noted. Findings consistent CHF. Low lung volumes. Small left pleural effusion . No pneumothorax . Surgical clips right chest. IMPRESSION: 1. Lines and tubes in stable position. 2. Cardiomegaly with persistent bilateral pulmonary alveolar infiltrates consistent with congestive heart failure pulmonary edema. Bilateral pneumonia cannot be excluded. Similar findings noted on prior exam. Small left pleural effusion. 3. Low lung volumes. Electronically Signed   By: Marcello Moores  Register   On: 07/24/2016 06:42     STUDIES:  07/17/2016 Echo: EF: 55-60%, MV: Calcified [place], LA: Mildly dilated, RA: Mildly Dilated, PAP: 41 mm HG, Small pericardial effusion.  CT Abdomen:07/17/2016 Probable acute pancreatitis affecting the tail of pancreatitis, mild, no findings of advanced disease. Bilateral pleural effusions with dependent atelectasis and or pneumonia. Cardiomegaly and small pericardial effusion.  VQ Scan>>07/18/16  Low probability pulmonary thromboembolism.   Renal US>> 07/17/16 Atrophic andechogenic appearance of the right kidney. Nonvisualizationof the  left kidney and urinary bladder.  CULTURES: 1/26/>> Urine Culture >> E Coli 1/26>> MRSA Negative 1/30 Urinalysis  ANTIBIOTICS: Rocephin 1/26>>2/1 Vancomycin 2/1>>> Zosyn 2/1>>> Diflucan 1/30>>  SIGNIFICANT EVENTS: HD Started>>07/18/2016 Intubation>> 07/21/2016  LINES/TUBES: ETT>> 07/21/2016>>> HD Cath>> 07/18/16>>>  DISCUSSION: Acute Hypoxic, hypercarbic Respiratory Failure in setting of OSA/ ARF/ and suspected over sedation due to poor renal clearance. Requiring emergent intubation 1/30. She is a short term HD candidate x 2 weeks to see if ARF will turn around.   ASSESSMENT / PLAN:  PULMONARY A: Acute Hypercarbic, hypoxemic Respiratory  Failure in setting of ARF, OSA, and questionable CAP P:   Wean FiO2 as able Wake up assessment daily ABX as above Hold off weaning til volume status is addressed. Once enough volume is taken off and patient is able to wean then will be a one way extubation Spoke with family, no trach/peg, once ready one way extubation, if not ready then comfort, DNR now, will give a few days to trial patient.  CARDIOVASCULAR A:  Hypotension EF 55-60 % per echo VQ scan negative for PE Hx HTN Troponin negative 07/17/2016 PAF on heparin P:  Levophed, wean as able Start vaso D/C neo May stop vaso once levo at 5 mcg Heparin gtt  Hold metoprolol MAP goal > 65 Telemetry Monitoring  RENAL A:   Acute RF with metabolic acidosis on admission, in setting of dehydration from diarrheal illness/diuretics/ATN? Atrophic andechogenic appearance of the right kidney. P:   HD per the renal team, but not long term HD candidate, HD again in AM Replete Lytes as needed BMET daily Avoid nephrotoxic drugs Monitor for return of urine output  GASTROINTESTINAL A:   Acute Pancreatitis  Extreme Morbid Obesity P:   NPO for now SUP  Restart TF.  HEMATOLOGIC A:   Anemia Leukocytosis  P:  CBC daily Monitor for signs of bleeding Transfuse for HGB of < 7 Heparin gtt  INFECTIOUS A:   Leukocytosis Afebrile but hypothermia Ecoli UTI Febrile and WBC increase Wound Left ankle P:   Follow up cultures  D/C rocephin Vanc/zosyn started 2/1 Trend fever and WBC daily Trend CMET  Wound care consulted  ENDOCRINE A:   Hypoglycemia- cortisol nml P:   CBG's Q 4 TF ISS  NEUROLOGIC A:   Acute encephalopathy in setting of ARF/uremia, narcotics P:   RASS goal: -1 Lights on during the day Reduce sedation as able  Patient will not do well, unable to tolerate volume removal, will attempt once more in AM, if continues to be unable to tolerate HD due to hypotension then will likely terminally extubate after  family meeting.  No family bedside 2/2.  The patient is critically ill with multiple organ systems failure and requires high complexity decision making for assessment and support, frequent evaluation and titration of therapies, application of advanced monitoring technologies and extensive interpretation of multiple databases.   Critical Care Time devoted to patient care services described in this note is  35  Minutes. This time reflects time of care of this signee Dr Jennet Maduro. This critical care time does not reflect procedure time, or teaching time or supervisory time of PA/NP/Med student/Med Resident etc but could involve care discussion time.  Rush Farmer, M.D. [facility] Pulmonary/Critical Care Medicine. Pager: [phone]. After hours pager: [phone].  07/24/2016, 9:49 AM

## 2016-07-25 ENCOUNTER — Inpatient Hospital Stay (HOSPITAL_COMMUNITY): Payer: Medicare Other

## 2016-07-25 DIAGNOSIS — N179 Acute kidney failure, unspecified: Secondary | ICD-10-CM

## 2016-07-25 LAB — CBC
HEMATOCRIT: 21.3 % — AB (ref 36.0–46.0)
Hemoglobin: 6.9 g/dL — CL (ref 12.0–15.0)
MCH: 31.5 pg (ref 26.0–34.0)
MCHC: 32.4 g/dL (ref 30.0–36.0)
MCV: 97.3 fL (ref 78.0–100.0)
PLATELETS: 62 10*3/uL — AB (ref 150–400)
RBC: 2.19 MIL/uL — ABNORMAL LOW (ref 3.87–5.11)
RDW: 15.9 % — AB (ref 11.5–15.5)
WBC: 10.8 10*3/uL — AB (ref 4.0–10.5)

## 2016-07-25 LAB — BLOOD GAS, ARTERIAL
Acid-base deficit: 3.9 mmol/L — ABNORMAL HIGH (ref 0.0–2.0)
Bicarbonate: 20.8 mmol/L (ref 20.0–28.0)
Drawn by: 449561
FIO2: 0.4
O2 Saturation: 99 %
PCO2 ART: 38.6 mmHg (ref 32.0–48.0)
PEEP: 5 cmH2O
Patient temperature: 98.6
RATE: 24 resp/min
VT: 420 mL
pH, Arterial: 7.35 (ref 7.350–7.450)
pO2, Arterial: 141 mmHg — ABNORMAL HIGH (ref 83.0–108.0)

## 2016-07-25 LAB — BASIC METABOLIC PANEL
ANION GAP: 13 (ref 5–15)
BUN: 40 mg/dL — ABNORMAL HIGH (ref 6–20)
CALCIUM: 7.6 mg/dL — AB (ref 8.9–10.3)
CO2: 20 mmol/L — ABNORMAL LOW (ref 22–32)
Chloride: 89 mmol/L — ABNORMAL LOW (ref 101–111)
Creatinine, Ser: 5 mg/dL — ABNORMAL HIGH (ref 0.44–1.00)
GFR, EST AFRICAN AMERICAN: 9 mL/min — AB (ref >60)
GFR, EST NON AFRICAN AMERICAN: 8 mL/min — AB (ref >60)
Glucose, Bld: 134 mg/dL — ABNORMAL HIGH (ref 65–99)
Potassium: 3.4 mmol/L — ABNORMAL LOW (ref 3.5–5.1)
Sodium: 122 mmol/L — ABNORMAL LOW (ref 135–145)

## 2016-07-25 LAB — HEPARIN LEVEL (UNFRACTIONATED): HEPARIN UNFRACTIONATED: 0.25 [IU]/mL — AB (ref 0.30–0.70)

## 2016-07-25 LAB — GLUCOSE, CAPILLARY
GLUCOSE-CAPILLARY: 148 mg/dL — AB (ref 65–99)
Glucose-Capillary: 142 mg/dL — ABNORMAL HIGH (ref 65–99)
Glucose-Capillary: 115 mg/dL — ABNORMAL HIGH (ref 65–99)

## 2016-07-25 LAB — PHOSPHORUS: Phosphorus: 3.4 mg/dL (ref 2.5–4.6)

## 2016-07-25 LAB — MAGNESIUM: MAGNESIUM: 1.6 mg/dL — AB (ref 1.7–2.4)

## 2016-07-25 MED ORDER — ATROPINE SULFATE 1 % OP SOLN
2.0000 [drp] | Freq: Four times a day (QID) | OPHTHALMIC | Status: DC
  Administered 2016-07-25 (×2): 2 [drp] via SUBLINGUAL
  Filled 2016-07-25: qty 2

## 2016-07-25 MED ORDER — ALBUMIN HUMAN 5 % IV SOLN: 25.0000 g | Freq: Once | INTRAVENOUS | Status: DC

## 2016-07-26 NOTE — Progress Notes (Signed)
1mg  of versed given to patient @ 1417, the other 1 mg wasted and witnessed by [facility].

## 2016-07-28 ENCOUNTER — Telehealth: Payer: Self-pay

## 2016-07-28 NOTE — Telephone Encounter (Signed)
On 07/28/16 I received a death certificate from [place] (original). The death certificate is for cremation. The patient is a patient of Doctor Dios. The death certificate will be taken to Pulmonary Unit @ [facility] this pm for signature.  On 08-15-16 I received the death certificate back from [place]. I got the death certificate ready and called the funeral home to let them know the original is ready for pickup. I also faxed a copy to the funeral home per the funeral home request.

## 2016-08-20 NOTE — Discharge Summary (Signed)
PULMONARY / CRITICAL CARE MEDICINE   Name: [name] MRN: [id number] DOB: 07/09/1950    ADMISSION DATE:  06/26/2016 CONSULTATION DATE:  07/21/2016  REFERRING MD:  Malachy Moan. Grandville Silos, MD Internal Medicine  CHIEF COMPLAINT:  Acute Respiratory Failure  HISTORY OF PRESENT ILLNESS:   Patient is a 66 year old female history of morbid obesity bedbound for several months, DVT on chronic anticoagulation who hasn't taken her anticoagulation approximately 6 days prior to admission, hypertension, depression, anxiety, atrial fibrillation , obstructive sleep apnea, diastolic heart failure presented to the ED with generalized weakness, chest pain, shortness of breath and abdominal pain. Patient noted to be in acute renal failure with a creatinine of 19.66, BUN of 190, potassium of 6.4, bicarbonate of 13. Patient also noted with significant hyperkalemia, probable community-acquired pneumonia, chest pain, probable UTI, and acute pancreatitis, and metabolic acidosis. She was emergently dialyzed 1/27/and 1/28 with some improvement. She was noted to be less alert the morning of 07/21/2016. Of note she does get several sedating medications. ABG indicated ph of 7.177, CO2 of 66 and PO2 of 67. CCM was emergently consulted for stat intubation with transfer of care as pt. Was transferred to ICU on life support.  Active Problem List: -Renal Failure>> Not candidate for long term HD.Plan on HD MWF  for 1-2 weeks and if no improvement will need to seriously consider withdrawal. -remia -HCAP: RLL infiltrate - Chest Pain: was on Heparin empirically - Abd. Pain>> Lipase elevated( > 605), possible acute pancreatitis - UTI - urine cx+ (E.Coli Sens Rocephin). - Hx DVT - was on Eliquis at home - Debility/ bedbound x 4 yrs - Hyperkalemia - renal diet, HD - Chronic wound left LE - Needs EOL, Goals of care conversation      PAST MEDICAL HISTORY :  She  has a past medical history of Anxiety; Arthritis; Breast cancer ([place])  (1988); Calcification of right breast; Cellulitis; Chronic bronchitis ([place]); Depression; Dysrhythmia; Exertional shortness of breath; Hypertension; Lymphedema of arm (2008, 2012); Obesities, morbid ([place]); Obstructive sleep apnea on CPAP (06/04/2012); PAF (paroxysmal atrial fibrillation) ([place]) (06/04/2012); Pneumonia; Right leg DVT ([place]) (04/06/2013); and Stasis dermatitis.  PAST SURGICAL HISTORY: She  has a past surgical history that includes Abdominal hysterectomy (1991); Carpal tunnel release (Right, 1995); Axillary lymph node dissection (Right, 1988); Wound debridement (09/03/2011); Breast lumpectomy (Right, 1988); Breast surgery (Right, 10/2011); and Cataract extraction w/ intraocular lens implant (Right, ~ 2006).  Allergies  Allergen Reactions  . Effexor [Venlafaxine Hydrochloride] Anxiety    Made her want to kill herself    No current facility-administered medications on file prior to encounter.    Current Outpatient Prescriptions on File Prior to Encounter  Medication Sig  . Amino Acids-Protein Hydrolys (FEEDING SUPPLEMENT, PRO-STAT SUGAR FREE 64,) LIQD Take 30 mLs by mouth 2 (two) times daily.  . diphenhydrAMINE (BENADRYL) 25 mg capsule Take 1 capsule (25 mg total) by mouth every 6 (six) hours as needed for allergies.  . furosemide (LASIX) 80 MG tablet Take 1 tablet (80 mg total) by mouth 2 (two) times daily.  Marland Kitchen gabapentin (NEURONTIN) 300 MG capsule Take 1 capsule (300 mg total) by mouth at bedtime as needed (nerve pain). (Patient taking differently: Take 300 mg by mouth at bedtime. )  . loperamide (IMODIUM A-D) 2 MG tablet Take 2 mg by mouth 4 (four) times daily as needed for diarrhea or loose stools.  Marland Kitchen LORazepam (ATIVAN) 0.5 MG tablet Take 0.25 mg by mouth every 12 (twelve) hours as needed for anxiety.  Marland Kitchen  nebivolol (BYSTOLIC) 2.5 MG tablet Take 1 tablet (2.5 mg total) by mouth daily.  . potassium chloride 20 MEQ TBCR Take 40 mEq by mouth 2 (two) times daily.  Marland Kitchen saccharomyces boulardii  (FLORASTOR) 250 MG capsule Take 1 capsule (250 mg total) by mouth 2 (two) times daily.  . sertraline (ZOLOFT) 50 MG tablet TK 1 T PO  QHS.  Marland Kitchen traZODone (DESYREL) 100 MG tablet TK 1 T PO  HS.  Marland Kitchen albuterol (PROVENTIL) (2.5 MG/3ML) 0.083% nebulizer solution Take 3 mLs (2.5 mg total) by nebulization every 6 (six) hours as needed for wheezing or shortness of breath. (Patient not taking: Reported on 07/09/2016)  . aspirin EC 81 MG tablet Take 1 tablet (81 mg total) by mouth daily. (Patient not taking: Reported on 07/12/2016)  . famotidine (PEPCID) 20 MG tablet Take 1 tablet (20 mg total) by mouth 2 (two) times daily. (Patient not taking: Reported on 07/22/2016)  . feeding supplement (BOOST / RESOURCE BREEZE) LIQD Take 1 Container by mouth 3 (three) times daily between meals. (Patient not taking: Reported on 06/29/2016)  . rivaroxaban (XARELTO) 20 MG TABS tablet Take 1 tablet (20 mg total) by mouth daily with supper. (Patient not taking: Reported on 07/12/2016)    FAMILY HISTORY:  Her '@FAMSTP' (<SUBSCRIPT> error)@  SOCIAL HISTORY: She  reports that she quit smoking about 12 years ago. Her smoking use included Cigarettes. She has a 20.00 pack-year smoking history. She has never used smokeless tobacco. She reports that she does not drink alcohol or use drugs.  REVIEW OF SYSTEMS:   Unable as patient is unresponsive, sedated  and intubated.  SUBJECTIVE:  Unable as patient is unresponsive, sedated  and intubated   VITAL SIGNS: BP (!) 43/31   Pulse (!) 0   Temp 97.6 F (36.4 C) (Oral)   Resp (!) 0   Ht '5\' 3"'  (1.6 m)   Wt (!) 378 lb 15.5 oz (171.9 kg)   SpO2 (!) 0%   BMI 67.13 kg/m   HEMODYNAMICS:  Blood Pressure soft post intubation  VENTILATOR SETTINGS:  FiO2: 100% Rate : 24 TV 420 Peep : 5  INTAKE / OUTPUT: I/O last 3 completed shifts: In: 3267.5 [I.V.:2567.5; NG/GT:500; IV Piggyback:200] Out: 35 [Urine:35]  PHYSICAL EXAMINATION: General: Obese, intubated sedated female supine in air  bed. Neuro: Sedated and Intubated, encephalopathic and lethargic prior to intubation HEENT: MM pink and moist, UTA for JVD , Oral ETT in place  Cardiovascular: RRR, no murmur, rub or gallop Lungs:  Coarse throughout, diminished per bases, minimal secretions Abdomen:  Obese, Non-tender, BS positive but quiet Musculoskeletal: + Foot drop, Pt. Has been bed bound x 4 years. Skin: Chronic skin changes,.  LABS:  BMET  Recent Labs Lab 07/23/16 0206 07/24/16 0758 07/27/2016 0359  NA 126* 124* 122*  K 3.2* 3.1* 3.4*  CL 91* 91* 89*  CO2 18* 21* 20*  BUN 69* 33* 40*  CREATININE 8.25* 4.78* 5.00*  GLUCOSE 107* 144* 134*    Electrolytes  Recent Labs Lab 07/23/16 0206 07/23/16 1206 07/24/16 0758 07-27-2016 0359  CALCIUM 7.5*  --  7.6* 7.6*  MG 1.5* 1.4* 1.5* 1.6*  PHOS 4.4 4.7* 3.1 3.4    CBC  Recent Labs Lab 07/23/16 0206 07/24/16 0758 07-27-16 0359  WBC 16.4* 16.1* 10.8*  HGB 7.9* 7.4* 6.9*  HCT 24.2* 23.1* 21.3*  PLT 138* 93* 62*    Coag's No results for input(s): APTT, INR in the last 168 hours.  Sepsis Markers  Recent Labs Lab  07/21/16 1530  LATICACIDVEN 1.6    ABG  Recent Labs Lab 07/23/16 0335 07/24/16 0352 07-30-2016 0430  PHART 7.299* 7.362 7.350  PCO2ART 38.8 41.1 38.6  PO2ART 95.9 122* 141*    Liver Enzymes  Recent Labs Lab 07/22/16 0322  AST 15  ALT 8*  ALKPHOS 92  BILITOT 0.5  ALBUMIN 1.4*    Cardiac Enzymes  Recent Labs Lab 07/21/16 1555 07/22/16 0322  TROPONINI <0.03 <0.03    Glucose  Recent Labs Lab 07/24/16 1124 07/24/16 1652 07/24/16 2012 Jul 30, 2016 0041 07/30/16 0405 07/30/16 0752  GLUCAP 141* 132* 132* 142* 148* 115*    Imaging No results found.   STUDIES:  07/17/2016 Echo: EF: 55-60%, MV: Calcified Annulus, LA: Mildly dilated, RA: Mildly Dilated, PAP: 41 mm HG, Small pericardial effusion.  CT Abdomen:07/17/2016 Probable acute pancreatitis affecting the tail of pancreatitis, mild, no findings of  advanced disease. Bilateral pleural effusions with dependent atelectasis and or pneumonia. Cardiomegaly and small pericardial effusion.  VQ Scan>>07/18/16  Low probability pulmonary thromboembolism.   Renal US>> 07/17/16 Atrophic andechogenic appearance of the right kidney. Nonvisualizationof the  left kidney and urinary bladder.  CULTURES: 1/26/>> Urine Culture >> E Coli 1/26>> MRSA Negative 1/30 Urinalysis  ANTIBIOTICS: Rocephin 1/26>> Diflucan 1/30>>  SIGNIFICANT EVENTS: HD Started>>07/18/2016 Intubation>> 07/21/2016  LINES/TUBES: ETT>> 07/21/2016 HD Cath>> 07/18/16  DISCUSSION: Acute Hypoxic, hypercarbic Respiratory Failure in setting of OSA/ ARF/ and suspected over sedation due to poor renal clearance. Requiring emergent intubation 1/30. She is a short term HD candidate x 2 weeks to see if ARF will turn around. Will need goals of care discussion with son and daughter, who are aware she has been admitted to ICU and is on life support.She is waking up and following commands, so hope for  short term intubation as mental status clears, and BiPAP/ CPAP at night once extubated.  ASSESSMENT / PLAN:  PULMONARY A: Acute Hypercarbic, hypoxemic Respiratory Failure in setting of ARF, OSA, and questionable CAP Urine Pneumococcus negative, Legionella pending P:   8 cc kg Wean FiO2 as able Wake up assessment daily CPAP trials daily as LOC improves ABG 1 hour after intubation CXR for ETT placement now and in am ABX per ID section Light sedation Consider BIPAP/ CPAP at HS  once extubated   CARDIOVASCULAR A:  Blood Pressure soft post intubation EF 55-60 % per echo VQ scan negative for PE Hx. Hypertension, but hypotensive post intubation Troponin negative 07/17/2016 PAF on heparin P:  Cortisol Hold metoprolol MAP goal > 65 Telemetry Monitoring Troponins x 3   RENAL A:   Acute RF with metabolic acidosis on admission, uncertain etiology Atrophic andechogenic appearance of  the right kidney. Plan for HD x 2 weeks, then if no improvement will need  P:   HD per the renal team, but not long term HD  candidate  Replete Lytes as needed BMET daily Avoid nephrotoxic drugs Monitor for return of urine output Continue volume removal as blood pressure allows    GASTROINTESTINAL A:   Extreme Morbid Obesity P:   NPO for now SUP   HEMATOLOGIC A:   Anemia Leukocytosis No Obvious signs of bleeding   P:  CBC daily Transfuse for HGB of < 7  INFECTIOUS A:   Leukocytosis Afebrile ? Acute Pancreatitis ? CAP on admit : low threshold for HAP coverage Wound Left ankle  P:   Monitor Micro daily Antibiotics as above De-escalate as able Trend fever and WBC daily UA now Follow CXR Trend CMET  Lactate Wound care consulted  ENDOCRINE A:   Hypoglycemia this am   P:   CBG's Q 4 Add SSI as needed for CBG's >> 180 sustained  NEUROLOGIC A:   Encephalopathic etiology ?/ uremia Following commands as she awakens from sedation Follows simple commands P:   RASS goal: -1 Lights on during the day Light , short acting sedation as oversedation may have lead to respiratory failure.    FAMILY  - Updates: I spoke with patient's son Larkin Ina. I updated him in full. I explained that his mother had  Developed respiratory failure and was placed on mechanical ventilation with hope to wean as she wakes up. I explained that she has multiple medical co-morbidities which make her care complex. I did speak with him about the fact HD is planned as a short term treatment, and that is her kidneys don't improve short term we will need to have discussions regarding goals of care. He verbalized understanding. He also has a sister  [place] who lives in [place] family meet or Palliative Care meeting due by:  07/24/2016   Magdalen Spatz, AGACNP-BC [place] Pager: [phone]  07/28/2016, 11:02 AM  STAFF NOTE: Linwood Dibbles, MD FACP have personally reviewed patient's available data, including medical history, events of note, physical examination and test results as part of my evaluation. I have discussed with resident/NP and other care providers such as pharmacist, RN and RRT. In addition, I personally evaluated patient and elicited key findings of: unresposnove, called emergent to bedside earlier for acute on chornic resp acidosis this am , no won NIMV remaining unresponsove, coarse crackles, no ronchi, no wheezing, massive obesity, limited edema, wound rt lower ext,m cuff BP left ankle, she requires emergent intubation, failed NIMV attempts, this is likely multifactorial, concern narcs/ ambien in setting acute renal failure and continued need for HD, noted she is NOT outpt candidate  HD, to 8 cc/kg, rate 24, abg to follow, pcxr stat, low threshold hcap coverage, not indicated at this stage, dc all sedation agents long acting, renal aware declines, no family at bedside, volume removal needed The patient is critically ill with multiple organ systems failure and requires high complexity decision making for assessment and support, frequent evaluation and titration of therapies, application of advanced monitoring technologies and extensive interpretation of multiple databases.   Critical Care Time devoted to patient care services described in this note is40 Minutes. This time reflects time of care of this signee: Merrie Roof, MD FACP. This critical care time does not reflect procedure time, or teaching time or supervisory time of PA/NP/Med student/Med Resident etc but could involve care discussion time. Rest per NP/medical resident whose note is outlined above and that I agree with   Lavon Paganini. Titus Mould, MD, [place] Pgr: [facility] 07/28/2016 11:02 AM    Addendum : Patient had a complicated ICU course. She remained critically ill. She was kept intubated. Family decided to make  patient comfort care. Please see my last progress note below:  PULMONARY / CRITICAL CARE MEDICINE   Name: [name] MRN: [id number] DOB: Aug 22, 1950    ADMISSION DATE:  07/03/2016 CONSULTATION DATE:  07/21/2016  REFERRING MD:  Malachy Moan. Grandville Silos, MD Internal Medicine  CHIEF COMPLAINT:  Acute Respiratory Failure  HISTORY OF PRESENT ILLNESS:   66yo woman with history of morbid obesity bedbound for several months, DVT on chronic anticoagulation who hasn't taken her anticoagulation approximately 6 days prior to  admission, hypertension, depression, anxiety, atrial fibrillation , obstructive sleep apnea, diastolic heart failure presented to the ED with generalized weakness, chest pain, shortness of breath and abdominal pain. Patient noted to be in acute renal failure with a creatinine of 19.66, BUN of 190, potassium of 6.4, bicarbonate of 13. Patient also noted with significant hyperkalemia, probable community-acquired pneumonia, chest pain, probable UTI, and acute pancreatitis, and metabolic acidosis. She was emergently dialyzed 1/27/and 1/28 with some improvement. She was noted to be less alert the morning of 07/21/2016. Of note she does get several sedating medications. ABG indicated ph of 7.177, CO2 of 66 and PO2 of 67. CCM was emergently consulted for stat intubation with transfer of care as pt. Was transferred to ICU on life support.  SUBJECTIVE:  Met w/ family.  Now wants to w/d  VITAL SIGNS: BP (!) 43/31   Pulse (!) 0   Temp 97.6 F (36.4 C) (Oral)   Resp (!) 0   Ht '5\' 3"'  (1.6 m)   Wt (!) 378 lb 15.5 oz (171.9 kg)   SpO2 (!) 0%   BMI 67.13 kg/m   VENTILATOR SETTINGS:  FiO2: 100%  INTAKE / OUTPUT: No intake or output data in the 24 hours ending 07/28/16 1103 General appearance:  66 Year old  Female, morbidly obese, unresponsive Eyes: anicteric sclerae , moist conjunctivae; PERRL, EOMI bilaterally. Mouth:  membranes and no mucosal ulcerations; normal hard and soft palate,  orally intubated  Neck: Trachea midline; neck supple, no JVD, neck is large.  Lungs/chest: scattered rhonchi, with normal respiratory effort and no intercostal retractions CV: RRR, no MRGs  Abdomen: Soft, non-tender; no masses or HSM Extremities: diffuse peripheral edema or extremity lymphadenopathy Skin: Normal temperature, turgor and texture; marked ecchymosis, diffuse edema, RUE larger than left.  Neuro/Psych: opens eyes to voice. No f/c   LABS:  BMET  Recent Labs Lab 07/23/16 0206 07/24/16 0758 2016/08/17 0359  NA 126* 124* 122*  K 3.2* 3.1* 3.4*  CL 91* 91* 89*  CO2 18* 21* 20*  BUN 69* 33* 40*  CREATININE 8.25* 4.78* 5.00*  GLUCOSE 107* 144* 134*    Electrolytes  Recent Labs Lab 07/23/16 0206 07/23/16 1206 07/24/16 0758 08/17/2016 0359  CALCIUM 7.5*  --  7.6* 7.6*  MG 1.5* 1.4* 1.5* 1.6*  PHOS 4.4 4.7* 3.1 3.4    CBC  Recent Labs Lab 07/23/16 0206 07/24/16 0758 08/17/16 0359  WBC 16.4* 16.1* 10.8*  HGB 7.9* 7.4* 6.9*  HCT 24.2* 23.1* 21.3*  PLT 138* 93* 62*    Coag's No results for input(s): APTT, INR in the last 168 hours.  Sepsis Markers  Recent Labs Lab 07/21/16 1530  LATICACIDVEN 1.6    ABG  Recent Labs Lab 07/23/16 0335 07/24/16 0352 2016-08-17 0430  PHART 7.299* 7.362 7.350  PCO2ART 38.8 41.1 38.6  PO2ART 95.9 122* 141*    Liver Enzymes  Recent Labs Lab 07/22/16 0322  AST 15  ALT 8*  ALKPHOS 92  BILITOT 0.5  ALBUMIN 1.4*    Cardiac Enzymes  Recent Labs Lab 07/21/16 1555 07/22/16 0322  TROPONINI <0.03 <0.03    Glucose  Recent Labs Lab 07/24/16 1124 07/24/16 1652 07/24/16 2012 2016-08-17 0041 08/17/2016 0405 2016-08-17 0752  GLUCAP 141* 132* 132* 142* 148* 115*    Imaging No results found.   STUDIES:   Echo 1/26: EF: 55-60%, MV: Calcified Annulus, LA: Mildly dilated, RA: Mildly Dilated, PAP: 41 mm HG, Small pericardial effusion. CT Abdomen:07/17/2016: Probable acute pancreatitis affecting the tail  of  pancreatitis, mild, no findings of advanced disease. Bilateral pleural effusions with dependent atelectasis and or pneumonia. Cardiomegaly and small pericardial effusion. VQ Scan>>07/18/16: Low probability pulmonary thromboembolism.  Renal US>> 07/17/16: Atrophic andechogenic appearance of the right kidney. Nonvisualizationof the  left kidney and urinary bladder.  CULTURES: 1/26/>> Urine Culture >> E Coli 1/26>> MRSA Negative 1/30 Urinalysis  ANTIBIOTICS: Rocephin 1/26>>2/1 Vancomycin 2/1>>> Zosyn 2/1>>> Diflucan 1/30>>  SIGNIFICANT EVENTS: HD Started>>07/18/2016 Intubation>> 07/21/2016  LINES/TUBES: ETT>> 07/21/2016>>> HD Cath>> 07/18/16>>>    ASSESSMENT / PLAN:  Acute hypercarbic/hypoxic respiratory failure in setting of decompensated OSA, renal failure and probably further c/b oversedation & CAP Hypotension  PAF Acute renal failure w/ metabolic acidosis (multifactorial) Hyponatremia  Hypokalemia  Acute pancreatitis Obesity  Anemia & leukocytosis  Ecoli UTI Sepsis (recurrent 2/1) Left ankle wound  Hypoglycemia  Acute encephalopathy in setting of ARF, uremia and narcotics Plan  DISCUSSION:  Acute on chronic hypoxic/ hypercarbic respiratory failure in setting of decompensated OSA, renal failure and probably further c/b sedating medications. Was emergently intubated. Deemed short term HD candidate but not candidate for long term HD. Continue supportive care, no escalation, was made full DNR and now family is ready to w/d as they verbalize that long term trach/PEG and HD would not be an acceptable outcome for their mother.   Plan Comfort care fent gtt Extubate today  -family understands she may only last minutes.   My critical care time 40 minutes.   Erick Colace ACNP-BC [place] Pager # [phone] OR # [phone] if no answer     ATTENDING NOTE / ATTESTATION NOTE :   I have discussed the case with the resident/APP  Marni Griffon   I  agree with the resident/APP's  history, physical examination, assessment, and plans.    I have edited the above note and modified it according to our agreed history, physical examination, assessment and plan.   Pt admitted with acute on chronic hypoxic/ hypercarbic respiratory failure in setting of decompensated OSA, renal failure and probably further c/b sedating medications. Was emergently intubated. Deemed short term HD candidate but not candidate for long term HD. Continue supportive care, no escalation of care.  famil made pt comfort care.  Pt was terminally extubated on 2/3.   Family :Family updated at length today by Marni Griffon.  Patient was terminally extubated. She was pronounced on 08/20/2016, 1705. Family was at bedside.  Procedures :intubation on 1/30, HD catheter on 1/27.    Monica Becton, MD 07/28/2016, 11:03 AM [facility] Pager [phone] After 3 pm or if no answer, call [phone]

## 2016-08-20 NOTE — Progress Notes (Signed)
ANTICOAGULATION CONSULT NOTE - Follow Up Consult  Pharmacy Consult for Heparin (currently on hold) Indication: Hx DVT/Afib  Allergies  Allergen Reactions  . Effexor [Venlafaxine Hydrochloride] Anxiety    Made her want to kill herself   Patient Measurements: Height: 5\' 3"  (160 cm) Weight: (!) 379 lb (171.9 kg) IBW/kg (Calculated) : 52.4 Vital Signs: Temp: 97.6 F (36.4 C) (02/03 0810) Temp Source: Oral (02/03 0810) BP: 114/64 (02/03 0900) Pulse Rate: 88 (02/03 0900)  Labs:  Recent Labs  07/23/16 0206 07/23/16 1206 07/24/16 0758 08-15-2016 0359  HGB 7.9*  --  7.4* 6.9*  HCT 24.2*  --  23.1* 21.3*  PLT 138*  --  93* 62*  HEPARINUNFRC 0.19* 0.20* 0.20* 0.25*  CREATININE 8.25*  --  4.78* 5.00*    Estimated Creatinine Clearance: 17.3 mL/min (by C-G formula based on SCr of 5 mg/dL (H)).   Assessment: 66 yo female on IV heparin for afib, hx DVT, heparin held overnight for dropping Hgb and blood clots suctioned from mouth. On Apixaban PTA (last dose 1/25).  Goal of Therapy:  Heparin level 0.3-0.7 units/ml Monitor platelets by anticoagulation protocol: Yes   Plan:  Continue holding heparin for now. F/u plans for withdrawal of care.  Uvaldo Rising, BCPS  Clinical Pharmacist Pager [phone]  08-15-16 9:31 AM

## 2016-08-20 NOTE — Progress Notes (Signed)
Patient went asystole at 1705, 2 RNs listen for two minutes, no breath or heart sounds heard, Elink notified. Family @ bedside. Emotional support given.

## 2016-08-20 NOTE — Progress Notes (Signed)
CRITICAL VALUE ALERT  Critical value received:  Hemoglobin 6.9  Date of notification:  Aug 10, 2016  Time of notification:  [id number]  Critical value read back:Yes.    Nurse who received alert:  Trilby Drummer, RN  MD notified (1st page):  Deterding, MD  Time of first page:  0448  MD notified (2nd page):  Time of second page:  Responding MD:  Deterding, MD  Time MD responded:  [phone]

## 2016-08-20 NOTE — Procedures (Signed)
Extubation Procedure Note  Patient Details:   Name: [name] DOB: May 13, 1951 MRN: [id number]   Pt extubated per MD order. Family at bedside.  Ciro Backer 08/22/16, 2:22 PM

## 2016-08-20 NOTE — Progress Notes (Signed)
Upon assessment, patient right arm is more edematous and bruised. Deterding, MD cameraed in to view arm. Verbal orders to mark bruising and keep arm elevated. Will pass on to AM RN.

## 2016-08-20 NOTE — Progress Notes (Signed)
Family stated they are ready to increase fentanyl and stop all blood pressure medicine. Offered Chaplain, will continue to provide emotional support.

## 2016-08-20 NOTE — Progress Notes (Signed)
PULMONARY / CRITICAL CARE MEDICINE   Name: [name] MRN: [id number] DOB: 12/05/1950    ADMISSION DATE:  06/29/2016 CONSULTATION DATE:  07/21/2016  REFERRING MD:  Daniel V. Thompson, MD Internal Medicine  CHIEF COMPLAINT:  Acute Respiratory Failure  HISTORY OF PRESENT ILLNESS:   66yo woman with history of morbid obesity bedbound for several months, DVT on chronic anticoagulation who hasn't taken her anticoagulation approximately 6 days prior to admission, hypertension, depression, anxiety, atrial fibrillation , obstructive sleep apnea, diastolic heart failure presented to the ED with generalized weakness, chest pain, shortness of breath and abdominal pain. Patient noted to be in acute renal failure with a creatinine of 19.66, BUN of 190, potassium of 6.4, bicarbonate of 13. Patient also noted with significant hyperkalemia, probable community-acquired pneumonia, chest pain, probable UTI, and acute pancreatitis, and metabolic acidosis. She was emergently dialyzed 1/27/and 1/28 with some improvement. She was noted to be less alert the morning of 07/21/2016. Of note she does get several sedating medications. ABG indicated ph of 7.177, CO2 of 66 and PO2 of 67. CCM was emergently consulted for stat intubation with transfer of care as pt. Was transferred to ICU on life support.  SUBJECTIVE:  Met w/ family.  Now wants to w/d  VITAL SIGNS: BP (!) 117/57   Pulse 64   Temp 97.4 F (36.3 C) (Oral)   Resp (!) 24   Ht 5' 3" (1.6 m)   Wt (!) 374 lb (169.6 kg)   SpO2 99%   BMI 66.25 kg/m   VENTILATOR SETTINGS: Vent Mode: PRVC FiO2 (%):  [40 %] 40 % Set Rate:  [24 bmp] 24 bmp Vt Set:  [420 mL] 420 mL PEEP:  [5 cmH20] 5 cmH20 Plateau Pressure:  [22 cmH20-25 cmH20] 24 cmH20FiO2: 100%  INTAKE / OUTPUT:  Intake/Output Summary (Last 24 hours) at 07/24/16 1513 Last data filed at 07/24/16 1500  Gross per 24 hour  Intake          5547.43 ml  Output             1293 ml  Net          4254.43 ml    General appearance:  66 Year old  Female, morbidly obese, unresponsive Eyes: anicteric sclerae , moist conjunctivae; PERRL, EOMI bilaterally. Mouth:  membranes and no mucosal ulcerations; normal hard and soft palate, orally intubated  Neck: Trachea midline; neck supple, no JVD, neck is large.  Lungs/chest: scattered rhonchi, with normal respiratory effort and no intercostal retractions CV: RRR, no MRGs  Abdomen: Soft, non-tender; no masses or HSM Extremities: diffuse peripheral edema or extremity lymphadenopathy Skin: Normal temperature, turgor and texture; marked ecchymosis, diffuse edema, RUE larger than left.  Neuro/Psych: opens eyes to voice. No f/c   LABS:  BMET  Recent Labs Lab 07/22/16 0322 07/23/16 0206 07/24/16 0758  NA 129* 126* 124*  K 3.2* 3.2* 3.1*  CL 96* 91* 91*  CO2 19* 18* 21*  BUN 65* 69* 33*  CREATININE 8.10* 8.25* 4.78*  GLUCOSE 93 107* 144*    Electrolytes  Recent Labs Lab 07/22/16 0322 07/23/16 0206 07/23/16 1206 07/24/16 0758  CALCIUM 7.7* 7.5*  --  7.6*  MG 1.4* 1.5* 1.4* 1.5*  PHOS 5.1* 4.4 4.7* 3.1    CBC  Recent Labs Lab 07/22/16 0322 07/23/16 0206 07/24/16 0758  WBC 12.8* 16.4* 16.1*  HGB 7.4* 7.9* 7.4*  HCT 23.2* 24.2* 23.1*  PLT 143* 138* 93*    Coag's  Recent Labs Lab 07/18/16   2323 07/19/16 1230  APTT 123* 50*    Sepsis Markers  Recent Labs Lab 07/21/16 1530  LATICACIDVEN 1.6    ABG  Recent Labs Lab 07/21/16 2055 07/23/16 0335 07/24/16 0352  PHART 7.355 7.299* 7.362  PCO2ART 38.5 38.8 41.1  PO2ART 318.0* 95.9 122*    Liver Enzymes  Recent Labs Lab 07/19/16 0335 07/20/16 0430 07/21/16 1000 07/22/16 0322  AST 13*  --   --  15  ALT 8*  --   --  8*  ALKPHOS 95  --   --  92  BILITOT 0.6  --   --  0.5  ALBUMIN 1.5* 1.4* 1.5* 1.4*    Cardiac Enzymes  Recent Labs Lab 07/21/16 1555 07/22/16 0322  TROPONINI <0.03 <0.03    Glucose  Recent Labs Lab 07/23/16 1651 07/23/16 2050  07/24/16 0030 07/24/16 0432 07/24/16 0809 07/24/16 1124  GLUCAP 107* 148* 153* 147* 144* 141*    Imaging Dg Chest Port 1 View  Result Date: 07/24/2016 CLINICAL DATA:  Intubation. EXAM: PORTABLE CHEST 1 VIEW COMPARISON:  07/23/2016. FINDINGS: Endotracheal tube, NG tube, esophageal probe, right IJ line in stable position. Cardiomegaly with diffuse bilateral pulmonary infiltrates again noted. Findings consistent CHF. Low lung volumes. Small left pleural effusion . No pneumothorax . Surgical clips right chest. IMPRESSION: 1. Lines and tubes in stable position. 2. Cardiomegaly with persistent bilateral pulmonary alveolar infiltrates consistent with congestive heart failure pulmonary edema. Bilateral pneumonia cannot be excluded. Similar findings noted on prior exam. Small left pleural effusion. 3. Low lung volumes. Electronically Signed   By: Thomas  Register   On: 07/24/2016 06:42     STUDIES:   Echo 1/26: EF: 55-60%, MV: Calcified [place], LA: Mildly dilated, RA: Mildly Dilated, PAP: 41 mm HG, Small pericardial effusion. CT Abdomen:07/17/2016: Probable acute pancreatitis affecting the tail of pancreatitis, mild, no findings of advanced disease. Bilateral pleural effusions with dependent atelectasis and or pneumonia. Cardiomegaly and small pericardial effusion. VQ Scan>>07/18/16: Low probability pulmonary thromboembolism.  Renal US>> 07/17/16: Atrophic andechogenic appearance of the right kidney. Nonvisualizationof the  left kidney and urinary bladder.  CULTURES: 1/26/>> Urine Culture >> E Coli 1/26>> MRSA Negative 1/30 Urinalysis  ANTIBIOTICS: Rocephin 1/26>>2/1 Vancomycin 2/1>>> Zosyn 2/1>>> Diflucan 1/30>>  SIGNIFICANT EVENTS: HD Started>>07/18/2016 Intubation>> 07/21/2016  LINES/TUBES: ETT>> 07/21/2016>>> HD Cath>> 07/18/16>>>    ASSESSMENT / PLAN:  Acute hypercarbic/hypoxic respiratory failure in setting of decompensated OSA, renal failure and probably further c/b oversedation &  CAP Hypotension  PAF Acute renal failure w/ metabolic acidosis (multifactorial) Hyponatremia  Hypokalemia  Acute pancreatitis Obesity  Anemia & leukocytosis  Ecoli UTI Sepsis (recurrent 2/1) Left ankle wound  Hypoglycemia  Acute encephalopathy in setting of ARF, uremia and narcotics Plan  DISCUSSION:  Acute on chronic hypoxic/ hypercarbic respiratory failure in setting of decompensated OSA, renal failure and probably further c/b sedating medications. Was emergently intubated. Deemed short term HD candidate but not candidate for long term HD. Continue supportive care, no escalation, was made full DNR and now family is ready to w/d as they verbalize that long term trach/PEG and HD would not be an acceptable outcome for their mother.   Plan Comfort care fent gtt Extubate today  -family understands she may only last minutes.   My critical care time 40 minutes.   Peter E Babcock ACNP-BC [facility] Pager # [phone] OR # [phone] if no answer     ATTENDING NOTE / ATTESTATION NOTE :   I have discussed the case with the   resident/APP  Marni Griffon   I agree with the resident/APP's  history, physical examination, assessment, and plans.    I have edited the above note and modified it according to our agreed history, physical examination, assessment and plan.   Pt admitted with acute on chronic hypoxic/ hypercarbic respiratory failure in setting of decompensated OSA, renal failure and probably further c/b sedating medications. Was emergently intubated. Deemed short term HD candidate but not candidate for long term HD. Continue supportive care, no escalation of care.  famil made pt comfort care.  Pt was terminally extubated on 2/3.   Family :Family updated at length today by Marni Griffon.    Monica Becton, MD 2016-07-26, 3:33 PM [facility] Pager [phone] After 3 pm or if no answer, call [phone]

## 2016-08-20 NOTE — Progress Notes (Signed)
[facility] Progress Note    Assessment/ Plan:   1. Renal failure - dehydration from diarrheal illness/ diuretics +/- ATN. Last creat here was 1.0 in 2016. Pt is not long-term HD candidate. Getting short-term HD while awaiting to see if renal fxn recovers.  - No signs of recovery -->very poor prognosis  Saw on HD '@8am'  4/2.25 bath 300/600 UF goal 2.5 liters  req alb 25gm mid treatment Currently on Levophed 26mg RIJ temp cath  Called bedside at ~815am to speak with son and daughter. They both would like to stop dialysis. They don't believe she will have meaningful recovery and also stated that this is not what she would have wanted. They are ready for a palliative consult which I think is reasonable. I also counseled them that extubation without removal of the excess fluid will likely not be successful and they fully understand this.   I spoke with the son 2/1; able to contact  her primary PA JAlvester Chouin GEscondida               -  She had nl renal function 6 mths ago but she's still not a long term dialysis candidate. Certainly will not effect or improve QOL. - Previous play was if there is no sign of recovery within the next week would consider withdrawal of HD. 2. Uremia - improved, asterixis resolved, sp HD Sat 11/27. 3. HCAP - on IV abx, RLL infiltrate 4. Volume - doubt CHF clinically, CXR w vasc crowding 5. Chest pain - per primary, on IV hep empirically 6. Abd pain - lipase ^605, poss acute pancreatitis 7. UTI - urine cx+ (E.Coli Sens Rocephin). 8. 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 Debility/ bedbound x 4 yrs 10. Hyperkalemia - renal diet, HD 11. Chronic wound left LE 12. EOL - would address code status, she is full code now but was DNR on her last admit here in Sept 2017  Subjective:   Intubated 07/21/16 for respiratory distress. Pressures drop w/ sedation req uptitration of pressors.  Currently on Levo 8 mcg  I returned 10 min after  the initial visit and visited with the son and daughter.   Objective:   BP 112/64 (BP Location: Right Leg)   Pulse 73   Temp 97.6 F (36.4 C) (Oral)   Resp (!) 24   Ht '5\' 3"'  (1.6 m)   Wt (!) 171.9 kg (379 lb)   SpO2 100%   BMI 67.14 kg/m   Intake/Output Summary (Last 24 hours) at 002/16/180825 Last data filed at 0Feb 16, [id number] Gross per 24 hour  Intake          3671.31 ml  Output             -165 ml  Net          3836.31 ml   Weight change: 2.268 kg (5 lb)  Physical Exam: Morbidly obese Chest clear intubated RRR no mrg Abd obese, nontender, no bs Ext chronic skin chgs, edema present esp in hands and 1-2+ in ext RIJ temp catheter in place   Imaging: Dg Chest Port 1 View  Result Date: 22018-02-16CLINICAL DATA:  66year old morbidly obese female who presented with weakness chest pain and respiratory failure. Intubated, being dialyzed. Initial encounter. EXAM: PORTABLE CHEST 1 VIEW COMPARISON:  07/24/2016 and earlier. FINDINGS: Portable AP semi upright view at at 0518 hours. Stable endotracheal tube tip between the clavicles and carina. An NG type  tube and possibly also an enteric feeding tube course to the abdomen, tip not included. Stable right IJ central line. Stable cardiomegaly and mediastinal contours. Dense retrocardiac opacity. Patchy and confluent left suprahilar and right mid in infrahilar opacity with mild progression since yesterday. Overall ventilation has not significantly changed since 07/21/2016. No pneumothorax. Small bilateral pleural effusions were evident by CT Abdomen and Pelvis on 07/17/2016. IMPRESSION: 1. Stable endotracheal tube and right IJ central line. Possibly 2 enteric tubes coursing to the abdomen, tip not included. 2. Ventilation not significantly changed since 07/21/2016. Bilateral lower lobe collapse or consolidation with superimposed patchy perihilar opacity compatible with bilateral pneumonia. Small pleural effusions visible on recent CT Abdomen and  Pelvis. Electronically Signed   By: Genevie Ann M.D.   On: Aug 18, 2016 07:21   Dg Chest Port 1 View  Result Date: 07/24/2016 CLINICAL DATA:  Intubation. EXAM: PORTABLE CHEST 1 VIEW COMPARISON:  07/23/2016. FINDINGS: Endotracheal tube, NG tube, esophageal probe, right IJ line in stable position. Cardiomegaly with diffuse bilateral pulmonary infiltrates again noted. Findings consistent CHF. Low lung volumes. Small left pleural effusion . No pneumothorax . Surgical clips right chest. IMPRESSION: 1. Lines and tubes in stable position. 2. Cardiomegaly with persistent bilateral pulmonary alveolar infiltrates consistent with congestive heart failure pulmonary edema. Bilateral pneumonia cannot be excluded. Similar findings noted on prior exam. Small left pleural effusion. 3. Low lung volumes. Electronically Signed   By: Marcello Moores  Register   On: 07/24/2016 06:42    Labs: BMET  Recent Labs Lab 07/19/16 0335 07/20/16 0430 07/21/16 1000 07/22/16 0322 07/23/16 0206 07/23/16 1206 07/24/16 0758 08/18/16 0359  NA 134* 134* 131* 129* 126*  --  124* 122*  K 3.7 3.7 3.9 3.2* 3.2*  --  3.1* 3.4*  CL 100* 102 97* 96* 91*  --  91* 89*  CO2 '23 22 24 ' 19* 18*  --  21* 20*  GLUCOSE 90 70 83 93 107*  --  144* 134*  BUN 94* 91* 65* 65* 69*  --  33* 40*  CREATININE 11.11* 10.36* 7.99* 8.10* 8.25*  --  4.78* 5.00*  CALCIUM 7.2* 7.2* 8.0* 7.7* 7.5*  --  7.6* 7.6*  PHOS  --  6.8* 6.2* 5.1* 4.4 4.7* 3.1 3.4   CBC  Recent Labs Lab 07/18/16 0954  07/21/16 1000 07/22/16 0322 07/23/16 0206 07/24/16 0758 08/18/16 0359  WBC 11.2*  < > 9.3 12.8* 16.4* 16.1* 10.8*  NEUTROABS 8.7*  --  7.0  --   --   --   --   HGB 9.4*  < > 8.4* 7.4* 7.9* 7.4* 6.9*  HCT 29.0*  < > 26.3* 23.2* 24.2* 23.1* 21.3*  MCV 97.3  < > 99.6 97.1 97.6 97.9 97.3  PLT 210  < > 115* 143* 138* 93* 62*  < > = values in this interval not displayed.  Medications:    . albumin human  25 g Intravenous Once  . aspirin EC  81 mg Oral Daily  .  chlorhexidine gluconate (MEDLINE KIT)  15 mL Mouth Rinse BID  . Chlorhexidine Gluconate Cloth  6 each Topical Daily  . feeding supplement (PRO-STAT SUGAR FREE 64)  60 mL Per Tube TID  . feeding supplement (VITAL HIGH PROTEIN)  1,000 mL Per Tube Q24H  . fluconazole (DIFLUCAN) IV  50 mg Intravenous Q24H  . mouth rinse  15 mL Mouth Rinse 10 times per day  . nystatin   Topical BID  . pantoprazole (PROTONIX) IV  40 mg Intravenous  Q24H  . piperacillin-tazobactam (ZOSYN)  IV  3.375 g Intravenous Q12H  . sodium chloride flush  10-40 mL Intracatheter Q12H      Otelia Santee, MD Aug 01, 2016, 8:25 AM

## 2016-08-20 NOTE — Progress Notes (Signed)
Hemoglobin 6.9 this AM. Deterding, MD called and notified. Verbal orders to hold Heparin for now, and reassess blood transfusion in AM. Will pass on to AM RN.

## 2016-08-20 NOTE — Progress Notes (Signed)
Dentures sent with body to morgue.

## 2016-08-20 DEATH — deceased

## 2018-09-21 IMAGING — CR DG CHEST 1V PORT
1 series · 1 of 1 positions shown · non-contrast
Comparison: Portable chest x-ray July 22, 2016

CLINICAL DATA: Intubated patient, CHF, respiratory failure,
dialysis dependent renal failure, former smoker

EXAM:
PORTABLE CHEST 1 VIEW

[AP]
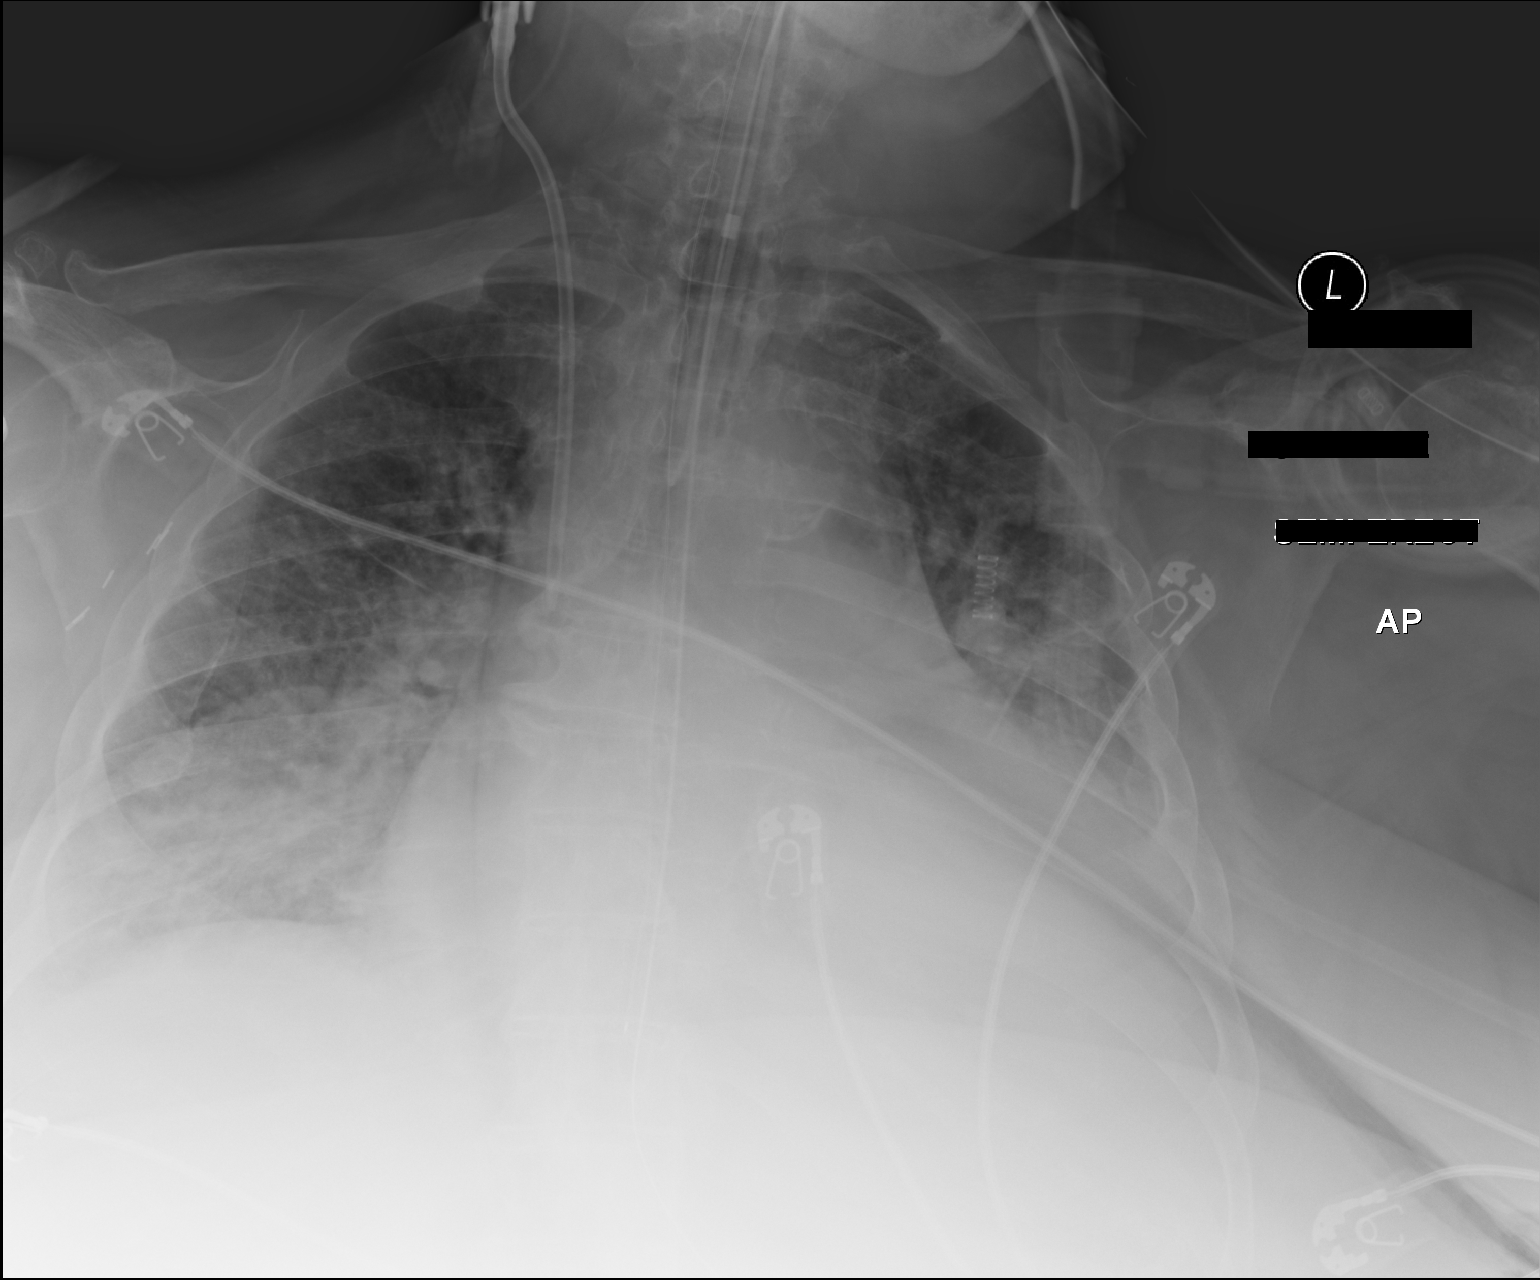

[1 of 1 positions shown; findings below may reference images not displayed]

FINDINGS: The lungs are reasonably well inflated. Confluent alveolar opacities
persist in the mid and lower lungs. There is partial obscuration of
the hemidiaphragms which is stable. The cardiac silhouette remains
enlarged. The pulmonary vascularity remains engorged. The
endotracheal tube tip lies 2.7 cm above the carina. The
esophagogastric tube tip in proximal port project below the inferior
margin of the image. The right internal jugular venous catheter tip
projects over the midportion of the SVC. There is dense
calcification in the wall of the aortic arch.
IMPRESSION: CHF with pulmonary interstitial and alveolar edema. Certainly
superimposed pneumonia is not excluded. Overall there has not been
significant interval change in the appearance of the chest since
yesterday's study.
